# Patient Record
Sex: Male | Born: 1945 | Race: White | Hispanic: No | Marital: Married | Smoking: Never smoker
Health system: Southern US, Community
[De-identification: ages and names within clinical notes are randomized; demographics above are authoritative.]

## PROBLEM LIST (undated history)

## (undated) DIAGNOSIS — R0602 Shortness of breath: Secondary | ICD-10-CM

## (undated) DIAGNOSIS — N201 Calculus of ureter: Secondary | ICD-10-CM

## (undated) DIAGNOSIS — C189 Malignant neoplasm of colon, unspecified: Secondary | ICD-10-CM

## (undated) DIAGNOSIS — T79A29A Traumatic compartment syndrome of unspecified lower extremity, initial encounter: Secondary | ICD-10-CM

## (undated) DIAGNOSIS — I82403 Acute embolism and thrombosis of unspecified deep veins of lower extremity, bilateral: Secondary | ICD-10-CM

## (undated) DIAGNOSIS — M1990 Unspecified osteoarthritis, unspecified site: Secondary | ICD-10-CM

## (undated) DIAGNOSIS — I2699 Other pulmonary embolism without acute cor pulmonale: Secondary | ICD-10-CM

## (undated) DIAGNOSIS — N189 Chronic kidney disease, unspecified: Secondary | ICD-10-CM

## (undated) DIAGNOSIS — I10 Essential (primary) hypertension: Secondary | ICD-10-CM

## (undated) DIAGNOSIS — J45909 Unspecified asthma, uncomplicated: Secondary | ICD-10-CM

## (undated) HISTORY — PX: TONSILLECTOMY: SUR1361

---

## 1994-08-14 HISTORY — PX: RIGHT COLECTOMY: SHX853

## 2013-04-10 HISTORY — PX: URETERAL STENT PLACEMENT: SHX822

## 2013-04-10 HISTORY — PX: TRANSURETHRAL RESECTION OF PROSTATE: SHX73

## 2013-04-14 DIAGNOSIS — I2699 Other pulmonary embolism without acute cor pulmonale: Secondary | ICD-10-CM

## 2013-04-14 HISTORY — DX: Other pulmonary embolism without acute cor pulmonale: I26.99

## 2013-04-18 ENCOUNTER — Inpatient Hospital Stay (HOSPITAL_COMMUNITY): Payer: PRIVATE HEALTH INSURANCE

## 2013-04-18 ENCOUNTER — Encounter (HOSPITAL_COMMUNITY): Payer: Self-pay | Admitting: Family Medicine

## 2013-04-18 ENCOUNTER — Inpatient Hospital Stay (HOSPITAL_COMMUNITY)
Admission: AD | Admit: 2013-04-18 | Discharge: 2013-04-24 | DRG: 662 | Disposition: A | Discharge status: Home Health | Payer: PRIVATE HEALTH INSURANCE | Source: Other Acute Inpatient Hospital | Attending: Internal Medicine | Admitting: Internal Medicine

## 2013-04-18 DIAGNOSIS — R062 Wheezing: Secondary | ICD-10-CM | POA: Diagnosis present

## 2013-04-18 DIAGNOSIS — D62 Acute posthemorrhagic anemia: Secondary | ICD-10-CM | POA: Diagnosis present

## 2013-04-18 DIAGNOSIS — Z9049 Acquired absence of other specified parts of digestive tract: Secondary | ICD-10-CM

## 2013-04-18 DIAGNOSIS — E8779 Other fluid overload: Secondary | ICD-10-CM | POA: Diagnosis not present

## 2013-04-18 DIAGNOSIS — E873 Alkalosis: Secondary | ICD-10-CM | POA: Diagnosis not present

## 2013-04-18 DIAGNOSIS — J9601 Acute respiratory failure with hypoxia: Secondary | ICD-10-CM

## 2013-04-18 DIAGNOSIS — R52 Pain, unspecified: Secondary | ICD-10-CM | POA: Diagnosis present

## 2013-04-18 DIAGNOSIS — F411 Generalized anxiety disorder: Secondary | ICD-10-CM | POA: Diagnosis present

## 2013-04-18 DIAGNOSIS — I129 Hypertensive chronic kidney disease with stage 1 through stage 4 chronic kidney disease, or unspecified chronic kidney disease: Secondary | ICD-10-CM | POA: Diagnosis present

## 2013-04-18 DIAGNOSIS — R7309 Other abnormal glucose: Secondary | ICD-10-CM | POA: Diagnosis not present

## 2013-04-18 DIAGNOSIS — E669 Obesity, unspecified: Secondary | ICD-10-CM | POA: Diagnosis present

## 2013-04-18 DIAGNOSIS — Z79899 Other long term (current) drug therapy: Secondary | ICD-10-CM

## 2013-04-18 DIAGNOSIS — T45515A Adverse effect of anticoagulants, initial encounter: Secondary | ICD-10-CM | POA: Diagnosis present

## 2013-04-18 DIAGNOSIS — N2889 Other specified disorders of kidney and ureter: Secondary | ICD-10-CM | POA: Diagnosis present

## 2013-04-18 DIAGNOSIS — N179 Acute kidney failure, unspecified: Principal | ICD-10-CM | POA: Diagnosis present

## 2013-04-18 DIAGNOSIS — E872 Acidosis, unspecified: Secondary | ICD-10-CM | POA: Diagnosis not present

## 2013-04-18 DIAGNOSIS — I2699 Other pulmonary embolism without acute cor pulmonale: Secondary | ICD-10-CM | POA: Diagnosis present

## 2013-04-18 DIAGNOSIS — J9600 Acute respiratory failure, unspecified whether with hypoxia or hypercapnia: Secondary | ICD-10-CM | POA: Diagnosis present

## 2013-04-18 DIAGNOSIS — F17200 Nicotine dependence, unspecified, uncomplicated: Secondary | ICD-10-CM | POA: Diagnosis present

## 2013-04-18 DIAGNOSIS — I959 Hypotension, unspecified: Secondary | ICD-10-CM | POA: Diagnosis present

## 2013-04-18 DIAGNOSIS — M109 Gout, unspecified: Secondary | ICD-10-CM | POA: Diagnosis present

## 2013-04-18 DIAGNOSIS — N135 Crossing vessel and stricture of ureter without hydronephrosis: Secondary | ICD-10-CM | POA: Diagnosis present

## 2013-04-18 DIAGNOSIS — Z6841 Body Mass Index (BMI) 40.0 and over, adult: Secondary | ICD-10-CM

## 2013-04-18 DIAGNOSIS — N189 Chronic kidney disease, unspecified: Secondary | ICD-10-CM | POA: Diagnosis present

## 2013-04-18 DIAGNOSIS — Z23 Encounter for immunization: Secondary | ICD-10-CM

## 2013-04-18 DIAGNOSIS — S37019A Minor contusion of unspecified kidney, initial encounter: Secondary | ICD-10-CM

## 2013-04-18 DIAGNOSIS — Z85038 Personal history of other malignant neoplasm of large intestine: Secondary | ICD-10-CM

## 2013-04-18 DIAGNOSIS — S37019D Minor contusion of unspecified kidney, subsequent encounter: Secondary | ICD-10-CM

## 2013-04-18 DIAGNOSIS — N1330 Unspecified hydronephrosis: Secondary | ICD-10-CM | POA: Diagnosis present

## 2013-04-18 DIAGNOSIS — N289 Disorder of kidney and ureter, unspecified: Secondary | ICD-10-CM | POA: Diagnosis present

## 2013-04-18 DIAGNOSIS — R319 Hematuria, unspecified: Secondary | ICD-10-CM | POA: Diagnosis present

## 2013-04-18 DIAGNOSIS — S37012A Minor contusion of left kidney, initial encounter: Secondary | ICD-10-CM

## 2013-04-18 HISTORY — DX: Unspecified asthma, uncomplicated: J45.909

## 2013-04-18 HISTORY — DX: Essential (primary) hypertension: I10

## 2013-04-18 HISTORY — DX: Chronic kidney disease, unspecified: N18.9

## 2013-04-18 HISTORY — DX: Other pulmonary embolism without acute cor pulmonale: I26.99

## 2013-04-18 HISTORY — DX: Shortness of breath: R06.02

## 2013-04-18 HISTORY — DX: Malignant neoplasm of colon, unspecified: C18.9

## 2013-04-18 HISTORY — DX: Calculus of ureter: N20.1

## 2013-04-18 HISTORY — DX: Unspecified osteoarthritis, unspecified site: M19.90

## 2013-04-18 LAB — CBC WITH DIFFERENTIAL/PLATELET
Basophils Absolute: 0.1 10*3/uL (ref 0.0–0.1)
Basophils Relative: 0 % (ref 0–1)
Hemoglobin: 9.2 g/dL — ABNORMAL LOW (ref 13.0–17.0)
MCHC: 33.9 g/dL (ref 30.0–36.0)
Neutro Abs: 15.3 10*3/uL — ABNORMAL HIGH (ref 1.7–7.7)
Neutrophils Relative %: 83 % — ABNORMAL HIGH (ref 43–77)
Platelets: 188 10*3/uL (ref 150–400)
RDW: 14.4 % (ref 11.5–15.5)

## 2013-04-18 LAB — COMPREHENSIVE METABOLIC PANEL
ALT: 16 U/L (ref 0–53)
Calcium: 7.8 mg/dL — ABNORMAL LOW (ref 8.4–10.5)
GFR calc Af Amer: 10 mL/min — ABNORMAL LOW (ref >90)
Glucose, Bld: 127 mg/dL — ABNORMAL HIGH (ref 70–99)
Sodium: 134 mEq/L — ABNORMAL LOW (ref 135–145)
Total Protein: 5.7 g/dL — ABNORMAL LOW (ref 6.0–8.3)

## 2013-04-18 LAB — TROPONIN I: Troponin I: <0.3 ng/mL (ref <0.30)

## 2013-04-18 LAB — APTT: aPTT: 39 seconds — ABNORMAL HIGH (ref 24–37)

## 2013-04-18 LAB — PRO B NATRIURETIC PEPTIDE: Pro B Natriuretic peptide (BNP): 1762 pg/mL — ABNORMAL HIGH (ref 0–125)

## 2013-04-18 LAB — PHOSPHORUS: Phosphorus: 7.7 mg/dL — ABNORMAL HIGH (ref 2.3–4.6)

## 2013-04-18 LAB — PROTIME-INR
INR: 1.47 (ref 0.00–1.49)
Prothrombin Time: 17.4 seconds — ABNORMAL HIGH (ref 11.6–15.2)

## 2013-04-18 LAB — MAGNESIUM: Magnesium: 1.9 mg/dL (ref 1.5–2.5)

## 2013-04-18 MED ORDER — SODIUM CHLORIDE 0.9 % IJ SOLN
3.0000 mL | Freq: Two times a day (BID) | INTRAMUSCULAR | Status: DC
  Administered 2013-04-18 – 2013-04-24 (×12): 3 mL via INTRAVENOUS

## 2013-04-18 MED ORDER — FENTANYL CITRATE 0.05 MG/ML IJ SOLN
INTRAMUSCULAR | Status: AC | PRN
  Administered 2013-04-18: 50 ug via INTRAVENOUS
  Administered 2013-04-18 (×2): 25 ug via INTRAVENOUS

## 2013-04-18 MED ORDER — FENTANYL CITRATE 0.05 MG/ML IJ SOLN
25.0000 ug | INTRAMUSCULAR | Status: DC | PRN
  Administered 2013-04-18 – 2013-04-19 (×5): 50 ug via INTRAVENOUS
  Filled 2013-04-18 (×4): qty 2

## 2013-04-18 MED ORDER — MIDAZOLAM HCL 2 MG/2ML IJ SOLN
INTRAMUSCULAR | Status: AC | PRN
  Administered 2013-04-18 (×2): 1 mg via INTRAVENOUS
  Administered 2013-04-18: 2 mg via INTRAVENOUS

## 2013-04-18 MED ORDER — SODIUM CHLORIDE 0.9 % IV SOLN: 250.0000 mL | INTRAVENOUS | Status: DC | PRN

## 2013-04-18 MED ORDER — HYDROMORPHONE HCL PF 1 MG/ML IJ SOLN
0.5000 mg | INTRAMUSCULAR | Status: DC | PRN
  Administered 2013-04-18 – 2013-04-19 (×2): 0.5 mg via INTRAVENOUS
  Filled 2013-04-18 (×3): qty 1

## 2013-04-18 MED ORDER — FENTANYL CITRATE 0.05 MG/ML IJ SOLN
INTRAMUSCULAR | Status: AC
  Filled 2013-04-18: qty 2

## 2013-04-18 MED ORDER — ALBUTEROL SULFATE (5 MG/ML) 0.5% IN NEBU: 2.5000 mg | INHALATION_SOLUTION | RESPIRATORY_TRACT | Status: DC | PRN

## 2013-04-18 MED ORDER — HYDROMORPHONE HCL PF 1 MG/ML IJ SOLN
1.0000 mg | Freq: Once | INTRAMUSCULAR | Status: AC
  Administered 2013-04-18: 1 mg via INTRAVENOUS
  Filled 2013-04-18: qty 1

## 2013-04-18 MED ORDER — IPRATROPIUM BROMIDE 0.02 % IN SOLN
0.5000 mg | Freq: Four times a day (QID) | RESPIRATORY_TRACT | Status: DC
  Administered 2013-04-18 – 2013-04-21 (×11): 0.5 mg via RESPIRATORY_TRACT
  Filled 2013-04-18 (×12): qty 2.5

## 2013-04-18 MED ORDER — ALBUTEROL SULFATE (5 MG/ML) 0.5% IN NEBU
2.5000 mg | INHALATION_SOLUTION | Freq: Four times a day (QID) | RESPIRATORY_TRACT | Status: DC
  Administered 2013-04-18 – 2013-04-19 (×4): 2.5 mg via RESPIRATORY_TRACT
  Filled 2013-04-18 (×5): qty 0.5

## 2013-04-18 MED ORDER — SODIUM CHLORIDE 0.9 % IJ SOLN: 3.0000 mL | INTRAMUSCULAR | Status: DC | PRN

## 2013-04-18 NOTE — Consult Note (Signed)
Urology Consult   Physician requesting consult: Simonds  Reason for consult: Hematuria s/p TURP/lithotripsy/ureteral stent  History of Present Illness: Jack [name] is a 67 y.o. cauc male with PMH significant for HTN, nephrolithiasis, BPH, and colon cancer who was transferred to [facility] today from [place] for treatment of worsening renal insufficiency and PE.  He is s/p cysto, lithotripsy, left ureteral stent placement, and TURP on 04/10/13 in [place]. Pt states he has had hematuria since procedure.  2 days after d/c home from procedure he developed dyspnea then dizziness and diaphoresis which prompted eval in ED at [facility].  His CR was elevated at 2.1 and he was found to have a PE. Anticoagulation was initiated and he was admitted.  He was also found to be anemic and received a transfusion. Since then his Cr and hematuria have worsened.  He had been receiving CBIs via a 84f 3-way foley cath with pink tinged fluid return until his arrival at [facility] at which time the irrigation was stopped.  CT scan performed 04/16/13 revealed a 7.7cm left sided subcapsular fluid collection of the left kidney/possible hematoma and an atrophic right kidney.  He is currently waiting for a PT/INR value so he can be taken to IR for an IVC filter.   He is complaining of back pain which he states started when the plate was placed under his back for a CXR.  He denies abdominal pain, F/C, CP, N/V.  He does have some SOB.    His f[facility].  Per RN old bag had 700cc of pink fluid with no clots.  New bag has no output. IV is KVO currently and his latest Cr is 6.28.  Nephrology has also been consulted.    Past Medical History  Diagnosis Date  . HTN (hypertension)   . Ureteral stone   . Colon cancer     Past Surgical History  Procedure Laterality Date  . Ureteral stent placement  04/10/2013  . Transurethral resection of prostate  04/10/2013  . Right colectomy  1996  Lithotripsy  Current  Hospital Medications:  Home Meds:    Medication List    ASK your doctor about these medications       allopurinol 300 MG tablet  Commonly known as:  ZYLOPRIM  Take 300 mg by mouth daily.     ANDROGEL PUMP 20.25 MG/ACT (1.62%) Gel  Generic drug:  Testosterone  Place 1 Act onto the skin daily.     ciprofloxacin 500 MG tablet  Commonly known as:  CIPRO  Take 500 mg by mouth 2 (two) times daily. For 7 days. Started 04/11/13     docusate sodium 100 MG capsule  Commonly known as:  COLACE  Take 100 mg by mouth 3 (three) times daily.     ketorolac 10 MG tablet  Commonly known as:  TORADOL  Take 10 mg by mouth every 6 (six) hours as needed for pain.     lisinopril 20 MG tablet  Commonly known as:  PRINIVIL,ZESTRIL  Take 20 mg by mouth daily.     OVER THE COUNTER MEDICATION  Take 1 tablet by mouth 3 (three) times daily. Medication for urinary frequency     oxyCODONE 5 MG immediate release tablet  Commonly known as:  Oxy IR/ROXICODONE  Take 5 mg by mouth every 4 (four) hours as needed for pain.     tamsulosin 0.4 MG Caps capsule  Commonly known as:  FLOMAX  Take 0.4 mg by  mouth daily.        Scheduled Meds: . albuterol  2.5 mg Nebulization Q6H  . ipratropium  0.5 mg Nebulization Q6H  . sodium chloride  3 mL Intravenous Q12H   Continuous Infusions:  PRN Meds:.sodium chloride, sodium chloride, albuterol, fentaNYL, sodium chloride  Allergies:  Allergies  Allergen Reactions  . Contrast Media [Iodinated Diagnostic Agents]     Blue dye    Family History  Problem Relation Age of Onset  . Urolithiasis      Social History:  reports that he has never smoked. His smokeless tobacco use includes Chew. His alcohol and drug histories are not on file.  ROS: A complete review of systems was performed.  All systems are negative except for pertinent findings as noted.  Physical Exam:  Vital signs in last 24 hours: Temp:  [97.7 F (36.5 C)] 97.7 F (36.5 C) (09/05  1205) Pulse Rate:  [95-105] 99 (09/05 1400) Resp:  [18-20] 18 (09/05 1300) BP: (110-122)/(55-62) 112/56 mmHg (09/05 1400) SpO2:  [94 %-100 %] 96 % (09/05 1401) Constitutional:  Alert and oriented, mild distress from back pain Cardiovascular: Regular rate and rhythm Respiratory: Normal respiratory effort, Lungs clear bilaterally GI: Abdomen is soft, nontender, nondistended, no abdominal masses GU: 22 3 way foley in place with bloody discharge noted at meatus; no urine in foley line or bag Lymphatic: No lymphadenopathy Neurologic: Grossly intact, no focal deficits Psychiatric: Normal mood and affect  Laboratory Data:   Recent Labs  04/18/13 1245  WBC 18.5*  HGB 9.2*  HCT 27.1*  PLT 188     Recent Labs  04/18/13 1245  NA 134*  K 4.6  CL 97  GLUCOSE 127*  BUN 71*  CALCIUM 7.8*  CREATININE 6.28*     Results for orders placed during the hospital encounter of 04/18/13 (from the past 24 hour(s))  MRSA PCR SCREENING     Status: None   Collection Time    04/18/13 11:07 AM      Result Value Range   MRSA by PCR NEGATIVE  NEGATIVE  GLUCOSE, CAPILLARY     Status: Abnormal   Collection Time    04/18/13 11:19 AM      Result Value Range   Glucose-Capillary 139 (*) 70 - 99 mg/dL  COMPREHENSIVE METABOLIC PANEL     Status: Abnormal   Collection Time    04/18/13 12:45 PM      Result Value Range   Sodium 134 (*) 135 - 145 mEq/L   Potassium 4.6  3.5 - 5.1 mEq/L   Chloride 97  96 - 112 mEq/L   CO2 21  19 - 32 mEq/L   Glucose, Bld 127 (*) 70 - 99 mg/dL   BUN 71 (*) 6 - 23 mg/dL   Creatinine, Ser 3.08 (*) 0.50 - 1.35 mg/dL   Calcium 7.8 (*) 8.4 - 10.5 mg/dL   Total Protein 5.7 (*) 6.0 - 8.3 g/dL   Albumin 2.6 (*) 3.5 - 5.2 g/dL   AST 15  0 - 37 U/L   ALT 16  0 - 53 U/L   Alkaline Phosphatase 65  39 - 117 U/L   Total Bilirubin 0.2 (*) 0.3 - 1.2 mg/dL   GFR calc non Af Amer 8 (*) >90 mL/min   GFR calc Af Amer 10 (*) >90 mL/min  MAGNESIUM     Status: None   Collection Time     04/18/13 12:45 PM      Result Value Range  Magnesium 1.9  1.5 - 2.5 mg/dL  PHOSPHORUS     Status: Abnormal   Collection Time    04/18/13 12:45 PM      Result Value Range   Phosphorus 7.7 (*) 2.3 - 4.6 mg/dL  TROPONIN I     Status: None   Collection Time    04/18/13 12:45 PM      Result Value Range   Troponin I <0.30  <0.30 ng/mL  PRO B NATRIURETIC PEPTIDE     Status: Abnormal   Collection Time    04/18/13 12:45 PM      Result Value Range   Pro B Natriuretic peptide (BNP) 1762.0 (*) 0 - 125 pg/mL  CBC WITH DIFFERENTIAL     Status: Abnormal   Collection Time    04/18/13 12:45 PM      Result Value Range   WBC 18.5 (*) 4.0 - 10.5 K/uL   RBC 3.30 (*) 4.22 - 5.81 MIL/uL   Hemoglobin 9.2 (*) 13.0 - 17.0 g/dL   HCT 16.1 (*) 09.6 - 04.5 %   MCV 82.1  78.0 - 100.0 fL   MCH 27.9  26.0 - 34.0 pg   MCHC 33.9  30.0 - 36.0 g/dL   RDW 40.9  81.1 - 91.4 %   Platelets 188  150 - 400 K/uL   Neutrophils Relative % 83 (*) 43 - 77 %   Neutro Abs 15.3 (*) 1.7 - 7.7 K/uL   Lymphocytes Relative 8 (*) 12 - 46 %   Lymphs Abs 1.5  0.7 - 4.0 K/uL   Monocytes Relative 9  3 - 12 %   Monocytes Absolute 1.6 (*) 0.1 - 1.0 K/uL   Eosinophils Relative 0  0 - 5 %   Eosinophils Absolute 0.0  0.0 - 0.7 K/uL   Basophils Relative 0  0 - 1 %   Basophils Absolute 0.1  0.0 - 0.1 K/uL   Recent Results (from the past 240 hour(s))  MRSA PCR SCREENING     Status: None   Collection Time    04/18/13 11:07 AM      Result Value Range Status   MRSA by PCR NEGATIVE  NEGATIVE Final   Comment:            The GeneXpert MRSA Assay (FDA     approved for NASAL specimens     only), is one component of a     comprehensive MRSA colonization     surveillance program. It is not     intended to diagnose MRSA     infection nor to guide or     monitor treatment for     MRSA infections.    Renal Function:  Recent Labs  04/18/13 1245  CREATININE 6.28*   CrCl is unknown because there is no height on file for the  current visit.  Radiologic Imaging: Dg Chest Port 1 View  04/18/2013   *RADIOLOGY REPORT*  Clinical Data: Evaluate for edema  PORTABLE CHEST - 1 VIEW  Comparison: 04/16/2013  Findings: Low lung volumes are present taking this into consideration heart size is mildly enlarged and stable.  The mediastinal contour appears unchanged with the right hilum appearing somewhat less prominent than on the prior exam. Mild central peribronchial cuffing and interstitial haziness is seen and this may indicate early or incipient pulmonary edema however the interstitium may be accentuated by low lung volumes as well.  No interstitial septal lines, pleural effusion or overt pulmonary edema is seen. No  focal infiltrates are noted.  Old healed rib fracture of the right posterior eighth rib is stable.  IMPRESSION: Subtle changes may indicate early or developing interstitial edema but no overt interstitial or alveolar edema is seen.  No focal infiltrates.   Original Report Authenticated By: Rhodia Albright, M.D.   Procedure:  Foley was hand irrigated with sterile water.  Pink tinged fluid return with several small clots.  Pt tolerated well.   Impression/Recommendation  Hematuria s/p cysto/lithotripsy/left ureteral stent placement/TURP in pt who had been on anticoagulation for PE--continue CBIs.  Instructed RN to document CBI fluid and foley output so approx I/Os can be determined.    Renal hematoma--monitor H/H.  No intervention necessary at this time.   Silas Flood 04/18/2013, 2:52 PM

## 2013-04-18 NOTE — Consult Note (Signed)
I have reviewed the patient's chart along with the history and exam and looked at his films and labs.   The urine is clearing on CBI which can be held now.

## 2013-04-18 NOTE — Progress Notes (Signed)
eLink Physician-Brief Progress Note Patient Name: Jack [name] DOB: [birth year] MRN: [id number]  Date of Service  04/18/2013   HPI/Events of Note   Severe GU pain despite fentanyl IV q2h  eICU Interventions  Give dilaudid 1mg  IV x 1 now and recheck pain score pre- and 20 min post and call MD   Intervention Category Intermediate Interventions: Pain - evaluation and management  Scottlynn Lindell 04/18/2013, 7:57 PM

## 2013-04-18 NOTE — Progress Notes (Signed)
Pt arrived to unit with Foley catheter from [facility] receiving continuous bladder irrigation.  Initial urinalysis unable to be obtained due to CBI therapy.

## 2013-04-18 NOTE — Procedures (Signed)
Placement of IVC filter with carbon dioxide.  Filter placed below renal veins.  No immediate complication.

## 2013-04-18 NOTE — H&P (Signed)
Jack [name] is an 67 y.o. male.   Chief Complaint: TURP procedure; L ureteral stent and double JJ-  performed at [facility] 04/10/13 Did well and sent home following day Developed chest pain and shortness of breath 9/2 Back to [facility]. VQ high probability of PE and started on IV Heparin Developed hematuria requiring bladder irrigation CT reveals L renal hematoma Also progressive renal insufficiency- worsening Cr Pt scheduled for retrievable Inferior Vena Cava filter placement  Pt with Hx colon ca; ARF; allergy to "blue dye" for xray HPI: HTN; renal stone- multiple; colon ca  Past Medical History  Diagnosis Date  . HTN (hypertension)   . Ureteral stone   . Colon cancer     Past Surgical History  Procedure Laterality Date  . Ureteral stent placement  04/10/2013  . Transurethral resection of prostate  04/10/2013  . Right colectomy  1996    Family History  Problem Relation Age of Onset  . Urolithiasis     Social History:  reports that he has never smoked. His smokeless tobacco use includes Chew. His alcohol and drug histories are not on file.  Allergies:  Allergies  Allergen Reactions  . Contrast Media [Iodinated Diagnostic Agents]     Blue dye    Medications Prior to Admission  Medication Sig Dispense Refill  . allopurinol (ZYLOPRIM) 300 MG tablet Take 300 mg by mouth daily.      . ciprofloxacin (CIPRO) 500 MG tablet Take 500 mg by mouth 2 (two) times daily. For 7 days. Started 04/11/13      . docusate sodium (COLACE) 100 MG capsule Take 100 mg by mouth 3 (three) times daily.      Marland Kitchen ketorolac (TORADOL) 10 MG tablet Take 10 mg by mouth every 6 (six) hours as needed for pain.      Marland Kitchen lisinopril (PRINIVIL,ZESTRIL) 20 MG tablet Take 20 mg by mouth daily.      Marland Kitchen OVER THE COUNTER MEDICATION Take 1 tablet by mouth 3 (three) times daily. Medication for urinary frequency      . oxyCODONE (OXY IR/ROXICODONE) 5 MG immediate release tablet Take 5 mg by mouth every 4 (four) hours  as needed for pain.      . tamsulosin (FLOMAX) 0.4 MG CAPS capsule Take 0.4 mg by mouth daily.      . Testosterone (ANDROGEL PUMP) 20.25 MG/ACT (1.62%) GEL Place 1 Act onto the skin daily.        Results for orders placed during the hospital encounter of 04/18/13 (from the past 48 hour(s))  GLUCOSE, CAPILLARY     Status: Abnormal   Collection Time    04/18/13 11:19 AM      Result Value Range   Glucose-Capillary 139 (*) 70 - 99 mg/dL   No results found.  Review of Systems  Constitutional: Negative for fever.  Respiratory: Positive for shortness of breath.   Cardiovascular: Positive for chest pain and leg swelling.  Gastrointestinal: Positive for abdominal pain. Negative for nausea and vomiting.  Genitourinary: Positive for hematuria.       Low UOP  Neurological: Positive for weakness. Negative for dizziness.    Blood pressure 122/62, pulse 105, temperature 97.7 F (36.5 C), temperature source Oral, resp. rate 20, SpO2 94.00%. Physical Exam  Constitutional: He is oriented to person, place, and time.  obese  Cardiovascular: Normal rate, regular rhythm and normal heart sounds.   Distant heart sounds  Respiratory: Effort normal. No respiratory distress. He has wheezes.  GI: Soft. Bowel  sounds are normal. He exhibits distension. There is tenderness.  Musculoskeletal: Normal range of motion. He exhibits edema.  Moves all 4s B leg edema  Neurological: He is alert and oriented to person, place, and time.  Skin: Skin is warm and dry.  Psychiatric: He has a normal mood and affect. His behavior is normal. Judgment and thought content normal.     Assessment/Plan TURP/ L ureteral stent and JJ placed 04/10/13([facility]) Developed SOB and chest pain 9/2 VQ hi prob PE- started on Heparin Developed hematuria and CT shows L renal hematoma Anticoagulation stopped Transferred to [facility] for IVC filter and poss need for dialysis Worsening renal fxn Pt aware of procedure benefits and risks  and agreeable to proceed Consent signed and in consent signed and in chart  Joliene Salvador A 04/18/2013, 12:40 PM

## 2013-04-18 NOTE — H&P (Addendum)
PULMONARY  / CRITICAL CARE MEDICINE  Name: Jack [name] MRN: [id number] DOB: [birth year]    ADMISSION DATE:  04/18/2013  REFERRING MD :  Joanette Gula PRIMARY SERVICE:  PCCM  CHIEF COMPLAINT:  Acute on chronic renal failure  BRIEF PATIENT DESCRIPTION:  24 M underwent TURP and L ureteral stent 8/29. Admitted to [facility] 9/02 with acute PE and anticoagulation initiated. Hospitalization c/b hematuria, progressive rean linsufficiency and anemia requiring transfusion. Bladder irrigations initiated and CT scan abdomen performed 9/03 revealed L renal fluid subcapsular fluid collection thought to represent hematoma. Transferred to [facility] 9/05 for impending need for HD and placement of IVC filter.   SIGNIFICANT EVENTS / STUDIES:  CT head 9/02:  NAD V/Q scan 9/02: high prob. Multiple unmatched Q defects Echocardiogram9/02: LVEF 5- 55%, RV moderately dialted CT abd 9/03: persistent L pelvocaliectasis/ureterectasis. 7.7 cm L sided subcapsular fluid collection, possible hematoma. Atrophic R kidney. S/P R hemicolectomy  ------------------- Above studies performed at [facility] ------------------------ 9/05 Renal US:   LINES / TUBES:   CULTURES:   ANTIBIOTICS:   HISTORY OF PRESENT ILLNESS:   As above. Presently complains of moderate pain in abdomen and chest rated as 5 out of 10. Denies rest dyspnea  PAST MEDICAL HISTORY :  Recurrent renal stones, s/p lithotripsy, S/P L ureteral stent Gout Hypertension Partial colectomy  Prior to Admission medications   Medication Sig Start Date End Date Taking? Authorizing Provider  OVER THE COUNTER MEDICATION Take 1 tablet by mouth 3 (three) times daily. Medication for urinary frequency   Yes Historical Provider, MD   Allergies  Allergen Reactions  . Contrast Media [Iodinated Diagnostic Agents]     Blue dye    FAMILY HISTORY:  Htn CAD Cancer, NOS  SOCIAL HISTORY: No EtOH No smoking Chews tobacco No occupational exposures Fully  independent when in [employer]  REVIEW OF SYSTEMS:   Review of Systems  Constitutional: Negative.   HENT: Negative.   Eyes: Negative.   Respiratory: Positive for shortness of breath.   Cardiovascular: Positive for chest pain.  Gastrointestinal: Negative.   Genitourinary: Positive for hematuria.  Musculoskeletal: Negative.   Skin: Negative.   Neurological: Positive for tremors.       Chronic  Endo/Heme/Allergies: Negative.   Psychiatric/Behavioral: Negative.     SUBJECTIVE:   VITAL SIGNS:   Reviewed  HEMODYNAMICS:   VENTILATOR SETTINGS:   INTAKE / OUTPUT: Intake/Output   None     PHYSICAL EXAMINATION: General:  Pleasant, WDWN, NAD Neuro:  Coarse tremor, no asterixis, normal motor/sens, DTRs symmetric, cognition intact HEENT:  WNL Cardiovascular: RRR s M Lungs: scattered wheezes, dependent rales Abdomen: obese, soft, NABS Ext: trace symmetric edema   LABS:  Labs from [place] reviewed  CBC No results found for this basename: WBC, HGB, HCT, PLT,  in the last 72 hours Coag's No results found for this basename: APTT, INR,  in the last 72 hours BMET No results found for this basename: NA, K, CL, CO2, BUN, CREATININE, GLUCOSE,  in the last 72 hours Electrolytes No results found for this basename: CALCIUM, MG, PHOS,  in the last 72 hours Sepsis Markers No results found for this basename: LACTICACIDVEN, PROCALCITON, O2SATVEN,  in the last 72 hours ABG No results found for this basename: PHART, PCO2ART, PO2ART,  in the last 72 hours Liver Enzymes No results found for this basename: AST, ALT, ALKPHOS, BILITOT, ALBUMIN,  in the last 72 hours Cardiac Enzymes No results found for this basename: TROPONINI, PROBNP,  in the last  72 hours Glucose No results found for this basename: GLUCAP,  in the last 72 hours  Imaging Images from [place] reviewed  CXR: pending  ASSESSMENT / PLAN:  PULMONARY A: Acute hypoxic respiratory failure Acute PE 9/02 Wheezing - suspect  due to pulm edema P:   CXR ordered BDs ordered Supplemental O2 Will need IVC filter in setting of significant bleeding - IR called  CARDIOVASCULAR A: No issues P:  Monitor   RENAL A: Acute renal insufficiency Suspect component of CKD Recurrent kidney stones Recent TURP and ureteral stent (8/29) Renal capsular hematoma Hematuria - bladder irrigation in place on transfer P:   Renal consult Urology consult - please address whether to cont irrigations  GASTROINTESTINAL A:  No issues P:   NPO for IVC filter placement Then CHO mod diet  HEMATOLOGIC A:  Acute blood loss anemia P:  Recheck CBC Transfuse for Hgb < 7.0 or active overt bleeding with HD compromise  INFECTIOUS A:  No issues P:   Monitor off abx  ENDOCRINE A:  No hx of DM P:   CBGs q 8 hrs -  SSI if glu > 180  NEUROLOGIC A:  Tremor, doubt asterixis Pain P:   Monitor for sign of uremia PRN fentanyl  TODAY'S SUMMARY:   I have personally obtained a history, examined the patient, evaluated laboratory and imaging results, formulated the assessment and plan and placed orders. CRITICAL CARE: The patient is critically ill with multiple organ systems failure and requires high complexity decision making for assessment and support, frequent evaluation and titration of therapies, application of advanced monitoring technologies and extensive interpretation of multiple databases. Critical Care Time devoted to patient care services described in this note is 35 minutes.   [name], MD ; [facility] [phone].  After 5:30 PM or weekends, call [phone]  Pulmonary and Critical Care Medicine [facility] Pager: [phone]  04/18/2013, 11:41 AM

## 2013-04-18 NOTE — Consult Note (Signed)
Reason for Consult: Acute Kidney injury  Referring Physician: Dr. [name] HPI:  Jack [name] is an 67 y.o. male presenting here from [place] [facility]. He had a lithotripsy, TURP, and uretal stent placed on 8/28. He was discharged home and did well for 2 days. He began to develop dyspnea 2 days after being home. 4 days after discharge he had an episode of dizziness and diaphoresis when he was helped to a chair by his wife. They went to the Ed where his creatinine was found to be 2.1, a troponin was elevated, and a V/Q scan revealed a PE. He was started on anticoagulation, ASA, and BB and admitted. They discontinued his lisinopril on admission. He has been admitted for 3 days and his creatinine has been slowly worsening since that time. He developed hematuria and blood around the meatus and his bladder was irrigated.   At home he also endorses several months of worsening vision, intermittent LE edema, L flank pain, suprapubic pressure, and knee pain he attributes to gout. He denies headache, chest pain, diarrhea, and constipation. He reports that he has had approx 18 stones in the last 10 years.       Baseline Cr??Diagnosed with large L renal hematoma.. Has stent on that side.  Undergoing CBI for hematuria. R kidney very small on CT scan.     Past Medical History  Diagnosis Date  . HTN (hypertension)   . Ureteral stone   . Colon cancer     Past Surgical History  Procedure Laterality Date  . Ureteral stent placement  04/10/2013  . Transurethral resection of prostate  04/10/2013  . Right colectomy  1996    Family History  Problem Relation Age of Onset  . Urolithiasis      Social History:  reports that he has never smoked. His smokeless tobacco use includes Chew. His alcohol and drug histories are not on file.  Allergies:  Allergies  Allergen Reactions  . Contrast Media [Iodinated Diagnostic Agents]     Blue dye    Medications: I have reviewed the patient's current  medications.   Results for orders placed during the hospital encounter of 04/18/13 (from the past 48 hour(s))  GLUCOSE, CAPILLARY     Status: Abnormal   Collection Time    04/18/13 11:19 AM      Result Value Range   Glucose-Capillary 139 (*) 70 - 99 mg/dL    No results found.  ROS: Per HPI  Vitals: BP 114/61, RR 24, Pulse 112   Gen: NAD, alert, cooperative with exam, sits up with significant effort HEENT: NCAT, EOMI, PERRL, Fundi benign , Pharynx without teeth. CV: RRR, good S1/S2, no murmur Resp: crackles at the bases BL, mildly labored, satting well on 2.5 L via [place]. Marked decreased bs. Abd: Soft, obese, tenderness to palpation diffusely but greatest in the LUQ, exam limited by body habitus Liver down 6 cm Ext: trace pitting edema, ted hose in place, 2+ DP pulses Neuro: Alert and oriented X 3, Strength 5/5 and sensation intact in all four extremities, CN2-12 intact Skin: several hyperpigmented macules on BL shins   Assessment/Plan: 1 Acute Kidney injury s/p obstruction, ureteral stent placement, and TURP - Multifactorial: hypotensive/volume depleted with ace inhibitor use causing likely ATN and ? still obstructed on L despite recent stent,   Need to see if potential obstruct on R or any poss function.  If is unilat kidney model and bleed, has ATN or hematoma with parnchymal compression +/- obstruction.   -  CT with L renal fluid collection, likely hematoma, and atrophic kidney on the R - Urology consulted- appreciate recommendations - DC bladder irrigation, collect UA and urine sodium/creatinine after 12 hours - Hx of gout, check uric acid to r/o urate nephropathy - Avoid renal insults: Avoid ace, limit dye, limit fluids - follow daily chemistries - strict I/o so we can get a good idea of UOP - Restrict fluids  2  PE  - IVC filter being placed, current blood loss anemia preventing anticoagulation - will monitor closely for volume overload which would complicate his  respiratory status  3 Hypertension: Avoid ACEi  4. Anemia - Likely blood loss from hematuria- monitor and replace as needed  5. Elevated troponin-  - Called NSTEMI originally by Cards in [place], likely elevated due to PE and decreased filtration by kidney - Continue anticoagulation, aspirin  Kevin Fenton 04/18/2013, 12:01 PM  I have seen and examined this patient and agree with the plan of care seen, eval, counseled patient and family.  Discussed with family, and primary svc.  No immed indic for RRT .  Neveen Daponte L 04/18/2013, 1:41 PM

## 2013-04-19 ENCOUNTER — Encounter (HOSPITAL_COMMUNITY): Payer: Self-pay | Admitting: Anesthesiology

## 2013-04-19 ENCOUNTER — Encounter (HOSPITAL_COMMUNITY)
Admission: AD | Disposition: A | Discharge status: Home Health | Payer: Self-pay | Source: Other Acute Inpatient Hospital | Attending: Pulmonary Disease

## 2013-04-19 ENCOUNTER — Inpatient Hospital Stay (HOSPITAL_COMMUNITY): Payer: PRIVATE HEALTH INSURANCE

## 2013-04-19 ENCOUNTER — Inpatient Hospital Stay (HOSPITAL_COMMUNITY): Payer: PRIVATE HEALTH INSURANCE | Admitting: Anesthesiology

## 2013-04-19 DIAGNOSIS — N179 Acute kidney failure, unspecified: Secondary | ICD-10-CM

## 2013-04-19 HISTORY — PX: CYSTOSCOPY WITH STENT PLACEMENT: SHX5790

## 2013-04-19 HISTORY — PX: CYSTOSCOPY: SHX5120

## 2013-04-19 HISTORY — PX: LAPAROTOMY: SHX154

## 2013-04-19 LAB — BASIC METABOLIC PANEL
BUN: 86 mg/dL — ABNORMAL HIGH (ref 6–23)
BUN: 94 mg/dL — ABNORMAL HIGH (ref 6–23)
BUN: 92 mg/dL — ABNORMAL HIGH (ref 6–23)
BUN: 93 mg/dL — ABNORMAL HIGH (ref 6–23)
CO2: 18 mEq/L — ABNORMAL LOW (ref 19–32)
CO2: 15 mEq/L — ABNORMAL LOW (ref 19–32)
CO2: 17 mEq/L — ABNORMAL LOW (ref 19–32)
CO2: 18 mEq/L — ABNORMAL LOW (ref 19–32)
Calcium: 7.8 mg/dL — ABNORMAL LOW (ref 8.4–10.5)
Chloride: 96 mEq/L (ref 96–112)
Chloride: 99 mEq/L (ref 96–112)
Creatinine, Ser: 8.27 mg/dL — ABNORMAL HIGH (ref 0.50–1.35)
Creatinine, Ser: 8.75 mg/dL — ABNORMAL HIGH (ref 0.50–1.35)
Glucose, Bld: 145 mg/dL — ABNORMAL HIGH (ref 70–99)
Glucose, Bld: 134 mg/dL — ABNORMAL HIGH (ref 70–99)
Glucose, Bld: 137 mg/dL — ABNORMAL HIGH (ref 70–99)
Glucose, Bld: 152 mg/dL — ABNORMAL HIGH (ref 70–99)
Potassium: 4.8 mEq/L (ref 3.5–5.1)
Potassium: 4.8 mEq/L (ref 3.5–5.1)
Sodium: 132 mEq/L — ABNORMAL LOW (ref 135–145)
Sodium: 131 mEq/L — ABNORMAL LOW (ref 135–145)

## 2013-04-19 LAB — LACTIC ACID, PLASMA
Lactic Acid, Venous: 1.4 mmol/L (ref 0.5–2.2)
Lactic Acid, Venous: 1.2 mmol/L (ref 0.5–2.2)
Lactic Acid, Venous: 1.5 mmol/L (ref 0.5–2.2)

## 2013-04-19 LAB — RENAL FUNCTION PANEL
Albumin: 2.3 g/dL — ABNORMAL LOW (ref 3.5–5.2)
Calcium: 7.5 mg/dL — ABNORMAL LOW (ref 8.4–10.5)
Chloride: 97 mEq/L (ref 96–112)
Creatinine, Ser: 9.79 mg/dL — ABNORMAL HIGH (ref 0.50–1.35)
GFR calc non Af Amer: 5 mL/min — ABNORMAL LOW (ref >90)

## 2013-04-19 LAB — COMPREHENSIVE METABOLIC PANEL
Albumin: 2.2 g/dL — ABNORMAL LOW (ref 3.5–5.2)
Alkaline Phosphatase: 59 U/L (ref 39–117)
BUN: 92 mg/dL — ABNORMAL HIGH (ref 6–23)
Potassium: 4.7 mEq/L (ref 3.5–5.1)
Sodium: 133 mEq/L — ABNORMAL LOW (ref 135–145)
Total Protein: 4.9 g/dL — ABNORMAL LOW (ref 6.0–8.3)

## 2013-04-19 LAB — BLOOD GAS, ARTERIAL
Acid-base deficit: 9.5 mmol/L — ABNORMAL HIGH (ref 0.0–2.0)
Bicarbonate: 17.5 mEq/L — ABNORMAL LOW (ref 20.0–24.0)
Drawn by: 36496
FIO2: 0.5 %
MECHVT: 600 mL
TCO2: 19 mmol/L (ref 0–100)
TCO2: 17.4 mmol/L (ref 0–100)
pCO2 arterial: 49.6 mmHg — ABNORMAL HIGH (ref 35.0–45.0)
pCO2 arterial: 42.4 mmHg (ref 35.0–45.0)
pH, Arterial: 7.173 — CL (ref 7.350–7.450)
pH, Arterial: 7.204 — ABNORMAL LOW (ref 7.350–7.450)

## 2013-04-19 LAB — CBC
HCT: 24.3 % — ABNORMAL LOW (ref 39.0–52.0)
HCT: 23.1 % — ABNORMAL LOW (ref 39.0–52.0)
HCT: 20.8 % — ABNORMAL LOW (ref 39.0–52.0)
Hemoglobin: 8.5 g/dL — ABNORMAL LOW (ref 13.0–17.0)
Hemoglobin: 8.1 g/dL — ABNORMAL LOW (ref 13.0–17.0)
MCH: 28.6 pg (ref 26.0–34.0)
MCHC: 34.1 g/dL (ref 30.0–36.0)
MCV: 81.8 fL (ref 78.0–100.0)
RBC: 2.97 MIL/uL — ABNORMAL LOW (ref 4.22–5.81)
RBC: 2.86 MIL/uL — ABNORMAL LOW (ref 4.22–5.81)
RDW: 14.8 % (ref 11.5–15.5)

## 2013-04-19 LAB — POCT I-STAT, CHEM 8
BUN: 110 mg/dL — ABNORMAL HIGH (ref 6–23)
Chloride: 103 mEq/L (ref 96–112)
Creatinine, Ser: 8.5 mg/dL — ABNORMAL HIGH (ref 0.50–1.35)
Potassium: 4.8 mEq/L (ref 3.5–5.1)
Sodium: 135 mEq/L (ref 135–145)

## 2013-04-19 LAB — POCT I-STAT 3, ART BLOOD GAS (G3+)
Acid-base deficit: 9 mmol/L — ABNORMAL HIGH (ref 0.0–2.0)
Bicarbonate: 16 mEq/L — ABNORMAL LOW (ref 20.0–24.0)

## 2013-04-19 LAB — CORTISOL: Cortisol, Plasma: 36.3 ug/dL

## 2013-04-19 LAB — PROTIME-INR: Prothrombin Time: 16.7 seconds — ABNORMAL HIGH (ref 11.6–15.2)

## 2013-04-19 LAB — MAGNESIUM: Magnesium: 1.9 mg/dL (ref 1.5–2.5)

## 2013-04-19 LAB — PHOSPHORUS: Phosphorus: 9.4 mg/dL — ABNORMAL HIGH (ref 2.3–4.6)

## 2013-04-19 LAB — GLUCOSE, CAPILLARY

## 2013-04-19 SURGERY — CYSTOSCOPY, WITH STENT INSERTION
Anesthesia: Choice | Site: Abdomen

## 2013-04-19 MED ORDER — ETOMIDATE 2 MG/ML IV SOLN
INTRAVENOUS | Status: DC | PRN
  Administered 2013-04-19: 15 mg via INTRAVENOUS
  Administered 2013-04-19: 5 mg via INTRAVENOUS

## 2013-04-19 MED ORDER — VANCOMYCIN HCL 10 G IV SOLR
2000.0000 mg | INTRAVENOUS | Status: AC
  Administered 2013-04-19: 2000 mg via INTRAVENOUS
  Filled 2013-04-19: qty 2000

## 2013-04-19 MED ORDER — PROPOFOL INFUSION 10 MG/ML OPTIME
INTRAVENOUS | Status: DC | PRN
  Administered 2013-04-19: 75 ug/kg/min via INTRAVENOUS

## 2013-04-19 MED ORDER — FENTANYL BOLUS VIA INFUSION
25.0000 ug | Freq: Four times a day (QID) | INTRAVENOUS | Status: DC | PRN
  Filled 2013-04-19: qty 100

## 2013-04-19 MED ORDER — CHLORHEXIDINE GLUCONATE 0.12 % MT SOLN
15.0000 mL | Freq: Two times a day (BID) | OROMUCOSAL | Status: DC
  Administered 2013-04-19 – 2013-04-20 (×2): 15 mL via OROMUCOSAL
  Filled 2013-04-19 (×2): qty 15

## 2013-04-19 MED ORDER — SODIUM BICARBONATE 8.4 % IV SOLN
INTRAVENOUS | Status: AC
  Administered 2013-04-19: 50 meq
  Filled 2013-04-19: qty 100

## 2013-04-19 MED ORDER — HYDROMORPHONE HCL PF 1 MG/ML IJ SOLN
1.0000 mg | INTRAMUSCULAR | Status: DC | PRN
  Administered 2013-04-19: 1 mg via INTRAVENOUS

## 2013-04-19 MED ORDER — SODIUM CHLORIDE 0.9 % IV SOLN
10.0000 mg | INTRAVENOUS | Status: DC | PRN
  Administered 2013-04-19: 15 ug/min via INTRAVENOUS

## 2013-04-19 MED ORDER — HYDROMORPHONE HCL PF 1 MG/ML IJ SOLN: 0.2500 mg | INTRAMUSCULAR | Status: DC | PRN

## 2013-04-19 MED ORDER — VECURONIUM BROMIDE 10 MG IV SOLR: INTRAVENOUS | Status: DC | PRN

## 2013-04-19 MED ORDER — SODIUM CHLORIDE 0.9 % IV SOLN
1.0000 mg/h | INTRAVENOUS | Status: DC
  Administered 2013-04-19: 4 mg/h via INTRAVENOUS
  Administered 2013-04-20: 5 mg/h via INTRAVENOUS
  Filled 2013-04-19 (×3): qty 10

## 2013-04-19 MED ORDER — SODIUM CHLORIDE 0.9 % IV SOLN
25.0000 ug/h | INTRAVENOUS | Status: DC
  Administered 2013-04-19 – 2013-04-20 (×2): 200 ug/h via INTRAVENOUS
  Filled 2013-04-19 (×2): qty 50

## 2013-04-19 MED ORDER — SUFENTANIL CITRATE 50 MCG/ML IV SOLN
INTRAVENOUS | Status: DC | PRN
  Administered 2013-04-19: 20 ug via INTRAVENOUS
  Administered 2013-04-19 (×3): 10 ug via INTRAVENOUS

## 2013-04-19 MED ORDER — LIDOCAINE HCL (CARDIAC) 20 MG/ML IV SOLN
INTRAVENOUS | Status: DC | PRN
  Administered 2013-04-19: 100 mg via INTRAVENOUS

## 2013-04-19 MED ORDER — FAMOTIDINE IN NACL 20-0.9 MG/50ML-% IV SOLN
20.0000 mg | INTRAVENOUS | Status: DC
  Administered 2013-04-19 – 2013-04-20 (×2): 20 mg via INTRAVENOUS
  Filled 2013-04-19 (×3): qty 50

## 2013-04-19 MED ORDER — ROCURONIUM BROMIDE 100 MG/10ML IV SOLN
INTRAVENOUS | Status: DC | PRN
  Administered 2013-04-19: 20 mg via INTRAVENOUS
  Administered 2013-04-19: 50 mg via INTRAVENOUS
  Administered 2013-04-19: 10 mg via INTRAVENOUS
  Administered 2013-04-19: 20 mg via INTRAVENOUS

## 2013-04-19 MED ORDER — SUCCINYLCHOLINE CHLORIDE 20 MG/ML IJ SOLN
INTRAMUSCULAR | Status: DC | PRN
  Administered 2013-04-19: 100 mg via INTRAVENOUS

## 2013-04-19 MED ORDER — DOPAMINE-DEXTROSE 3.2-5 MG/ML-% IV SOLN
2.0000 ug/kg/min | INTRAVENOUS | Status: DC
  Administered 2013-04-19: 10 ug/kg/min via INTRAVENOUS
  Filled 2013-04-19: qty 250

## 2013-04-19 MED ORDER — ALBUMIN HUMAN 5 % IV SOLN
25.0000 g | Freq: Once | INTRAVENOUS | Status: AC
  Administered 2013-04-19: 25 g via INTRAVENOUS
  Filled 2013-04-19 (×2): qty 250

## 2013-04-19 MED ORDER — DOPAMINE-DEXTROSE 3.2-5 MG/ML-% IV SOLN
INTRAVENOUS | Status: AC
  Filled 2013-04-19: qty 250

## 2013-04-19 MED ORDER — MIDAZOLAM HCL 5 MG/5ML IJ SOLN
INTRAMUSCULAR | Status: DC | PRN
  Administered 2013-04-19 (×2): 2 mg via INTRAVENOUS

## 2013-04-19 MED ORDER — BIOTENE DRY MOUTH MT LIQD
15.0000 mL | Freq: Four times a day (QID) | OROMUCOSAL | Status: DC
  Administered 2013-04-20 (×2): 15 mL via OROMUCOSAL

## 2013-04-19 MED ORDER — PHENYLEPHRINE HCL 10 MG/ML IJ SOLN
INTRAMUSCULAR | Status: DC | PRN
  Administered 2013-04-19 (×2): 80 ug via INTRAVENOUS

## 2013-04-19 MED ORDER — SODIUM CHLORIDE 0.9 % IV SOLN
INTRAVENOUS | Status: DC | PRN
  Administered 2013-04-19 (×2): via INTRAVENOUS

## 2013-04-19 MED ORDER — PIPERACILLIN-TAZOBACTAM IN DEX 2-0.25 GM/50ML IV SOLN
2.2500 g | Freq: Three times a day (TID) | INTRAVENOUS | Status: DC
  Administered 2013-04-19 – 2013-04-20 (×2): 2.25 g via INTRAVENOUS
  Filled 2013-04-19 (×4): qty 50

## 2013-04-19 MED ORDER — ALBUTEROL SULFATE (5 MG/ML) 0.5% IN NEBU: 2.5000 mg | INHALATION_SOLUTION | RESPIRATORY_TRACT | Status: DC | PRN

## 2013-04-19 MED ORDER — DEXMEDETOMIDINE HCL IN NACL 200 MCG/50ML IV SOLN: 0.4000 ug/kg/h | INTRAVENOUS | Status: DC

## 2013-04-19 MED ORDER — SODIUM CHLORIDE 0.9 % IV BOLUS (SEPSIS)
500.0000 mL | Freq: Once | INTRAVENOUS | Status: AC
  Administered 2013-04-19: 500 mL via INTRAVENOUS

## 2013-04-19 MED ORDER — DEXTROSE 5 % IV SOLN
2.0000 g | INTRAVENOUS | Status: AC
  Administered 2013-04-19: 2 g via INTRAVENOUS
  Filled 2013-04-19: qty 2

## 2013-04-19 MED ORDER — VECURONIUM BROMIDE 10 MG IV SOLR
INTRAVENOUS | Status: DC | PRN
  Administered 2013-04-19: 10 mg via INTRAVENOUS

## 2013-04-19 MED ORDER — PHYTONADIONE 5 MG PO TABS
5.0000 mg | ORAL_TABLET | Freq: Once | ORAL | Status: AC
  Administered 2013-04-19: 5 mg via ORAL
  Filled 2013-04-19: qty 1

## 2013-04-19 MED ORDER — LEVOFLOXACIN IN D5W 750 MG/150ML IV SOLN
750.0000 mg | Freq: Once | INTRAVENOUS | Status: AC
  Administered 2013-04-19: 750 mg via INTRAVENOUS
  Filled 2013-04-19: qty 150

## 2013-04-19 MED ORDER — FENTANYL CITRATE 0.05 MG/ML IJ SOLN
INTRAMUSCULAR | Status: DC | PRN
  Administered 2013-04-19: 100 ug via INTRAVENOUS
  Administered 2013-04-19: 50 ug via INTRAVENOUS
  Administered 2013-04-19 (×2): 100 ug via INTRAVENOUS

## 2013-04-19 MED ORDER — DIATRIZOATE MEGLUMINE 30 % UR SOLN
URETHRAL | Status: DC | PRN
  Administered 2013-04-19: 50 mL via URETHRAL

## 2013-04-19 MED ORDER — ALBUTEROL SULFATE (5 MG/ML) 0.5% IN NEBU
INHALATION_SOLUTION | RESPIRATORY_TRACT | Status: AC
  Filled 2013-04-19: qty 0.5

## 2013-04-19 MED ORDER — PANTOPRAZOLE SODIUM 40 MG IV SOLR
40.0000 mg | Freq: Every day | INTRAVENOUS | Status: DC
  Administered 2013-04-19 – 2013-04-21 (×3): 40 mg via INTRAVENOUS
  Filled 2013-04-19 (×3): qty 40

## 2013-04-19 MED ORDER — LEVOFLOXACIN IN D5W 500 MG/100ML IV SOLN: 500.0000 mg | INTRAVENOUS | Status: DC

## 2013-04-19 MED ORDER — ALBUTEROL SULFATE HFA 108 (90 BASE) MCG/ACT IN AERS
4.0000 | INHALATION_SPRAY | RESPIRATORY_TRACT | Status: DC | PRN
  Filled 2013-04-19: qty 6.7

## 2013-04-19 MED ORDER — ALBUMIN HUMAN 5 % IV SOLN
INTRAVENOUS | Status: DC | PRN
  Administered 2013-04-19: 17:00:00 via INTRAVENOUS

## 2013-04-19 MED ORDER — MIDAZOLAM BOLUS VIA INFUSION
1.0000 mg | INTRAVENOUS | Status: DC | PRN
  Administered 2013-04-19 – 2013-04-20 (×2): 2 mg via INTRAVENOUS
  Filled 2013-04-19: qty 2

## 2013-04-19 MED ORDER — ACETAMINOPHEN 10 MG/ML IV SOLN
1000.0000 mg | INTRAVENOUS | Status: AC
  Administered 2013-04-20: 1000 mg via INTRAVENOUS
  Filled 2013-04-19: qty 100

## 2013-04-19 MED ORDER — ALBUTEROL SULFATE HFA 108 (90 BASE) MCG/ACT IN AERS
4.0000 | INHALATION_SPRAY | RESPIRATORY_TRACT | Status: DC
  Filled 2013-04-19: qty 6.7

## 2013-04-19 MED ORDER — NALOXONE HCL 0.4 MG/ML IJ SOLN
INTRAMUSCULAR | Status: AC
  Filled 2013-04-19: qty 1

## 2013-04-19 MED ORDER — MIDAZOLAM HCL 2 MG/2ML IJ SOLN
1.0000 mg | INTRAMUSCULAR | Status: DC | PRN
  Administered 2013-04-19 (×2): 2 mg via INTRAVENOUS
  Filled 2013-04-19 (×2): qty 2

## 2013-04-19 MED ORDER — ONDANSETRON HCL 4 MG/2ML IJ SOLN: 4.0000 mg | Freq: Once | INTRAMUSCULAR | Status: DC | PRN

## 2013-04-19 MED ORDER — ALBUTEROL SULFATE (5 MG/ML) 0.5% IN NEBU
2.5000 mg | INHALATION_SOLUTION | Freq: Four times a day (QID) | RESPIRATORY_TRACT | Status: DC
  Administered 2013-04-19 – 2013-04-21 (×7): 2.5 mg via RESPIRATORY_TRACT
  Filled 2013-04-19 (×6): qty 0.5

## 2013-04-19 MED ORDER — SODIUM CHLORIDE 0.9 % IR SOLN
Status: DC | PRN
  Administered 2013-04-19: 6000 mL via INTRAVESICAL

## 2013-04-19 SURGICAL SUPPLY — 33 items
BUCKET BIOHAZARD WASTE 5 GAL (MISCELLANEOUS) ×4 IMPLANT
CATH COUDE 22FR 5CC (CATHETERS) ×3 IMPLANT
CATH FOLEY 2WAY SLVR  5CC 22FR (CATHETERS) ×1
CATH FOLEY 2WAY SLVR 5CC 22FR (CATHETERS) ×3 IMPLANT
CATH FOLEY 3WAY 30CC 20FR (CATHETERS) ×4 IMPLANT
CATH RIBBED COUDE 30CC (CATHETERS) ×1
CATH URET 5FR 28IN OPEN ENDED (CATHETERS) ×4 IMPLANT
CLOTH BEACON ORANGE TIMEOUT ST (SAFETY) ×4 IMPLANT
COVER MAYO STAND STRL (DRAPES) ×4 IMPLANT
COVER SURGICAL LIGHT HANDLE (MISCELLANEOUS) ×4 IMPLANT
DRAPE CAMERA CLOSED 9X96 (DRAPES) ×4 IMPLANT
GLOVE SS BIOGEL STRL SZ 8 (GLOVE) ×6 IMPLANT
GLOVE SUPERSENSE BIOGEL SZ 8 (GLOVE) ×2
GOWN PREVENTION PLUS XLARGE (GOWN DISPOSABLE) ×4 IMPLANT
GOWN SRG XL XLNG 56XLVL 4 (GOWN DISPOSABLE) ×9 IMPLANT
GOWN STRL NON-REIN XL XLG LVL4 (GOWN DISPOSABLE) ×3
GUIDEWIRE STR DUAL SENSOR (WIRE) ×4 IMPLANT
H R LUBE JELLY XXX (MISCELLANEOUS) ×4 IMPLANT
KIT BASIN OR (CUSTOM PROCEDURE TRAY) ×8 IMPLANT
KIT ROOM TURNOVER OR (KITS) ×4 IMPLANT
PACK CYSTOSCOPY (CUSTOM PROCEDURE TRAY) ×4 IMPLANT
PACK GENERAL/GYN (CUSTOM PROCEDURE TRAY) ×4 IMPLANT
PAD ARMBOARD 7.5X6 YLW CONV (MISCELLANEOUS) ×8 IMPLANT
SPONGE GAUZE 4X4 12PLY (GAUZE/BANDAGES/DRESSINGS) ×4 IMPLANT
STENT CONTOUR NO GW 8FR 26CM (STENTS) ×8 IMPLANT
SUT ETHILON 2 0 PSLX (SUTURE) ×4 IMPLANT
SUT VIC AB 2-0 UR5 27 (SUTURE) ×8 IMPLANT
SYRINGE IRR TOOMEY STRL 70CC (SYRINGE) ×4 IMPLANT
TAPE CLOTH SURG 4X10 WHT LF (GAUZE/BANDAGES/DRESSINGS) ×4 IMPLANT
THERMADRAPE LEGGINGS (DRAPES) IMPLANT
TOWEL OR 17X24 6PK STRL BLUE (TOWEL DISPOSABLE) ×4 IMPLANT
TOWEL OR 17X26 10 PK STRL BLUE (TOWEL DISPOSABLE) ×8 IMPLANT
UNDERPAD 30X30 INCONTINENT (UNDERPADS AND DIAPERS) ×4 IMPLANT

## 2013-04-19 NOTE — Progress Notes (Signed)
eLink Physician-Brief Progress Note Patient Name: Jack [name] DOB: [birth year] MRN: [id number]  Date of Service  04/19/2013   HPI/Events of Note  Agitation    eICU Interventions  Change versed prn to drip       Dmani Mizer 04/19/2013, 9:45 PM

## 2013-04-19 NOTE — Progress Notes (Signed)
Call made to Dr. Frederico Hamman via elink regarding patients urine output. Urine output minimal. 10ml throughout whole shift. Bladder scan done which showed 50ml.   Dr. Annabell Howells notified and aware. Patient is alert and oritented does not appear in any distress. Per Dr Annabell Howells, discontinue continuous bladder irrigation.  Output from irrigation is clear. No further orders at this time

## 2013-04-19 NOTE — Progress Notes (Signed)
Subjective: Interval History: has complaints distended and pain.  Objective: Vital signs in last 24 hours: Temp:  [97.1 F (36.2 C)-98 F (36.7 C)] 98 F (36.7 C) (09/06 0739) Pulse Rate:  [89-108] 102 (09/06 0758) Resp:  [15-30] 20 (09/06 0758) BP: (76-130)/(29-96) 99/44 mmHg (09/06 0600) SpO2:  [94 %-100 %] 98 % (09/06 0758) Weight:  [128.1 kg (282 lb 6.6 oz)] 128.1 kg (282 lb 6.6 oz) (09/06 0500) Weight change:   Intake/Output from previous day: 09/05 0701 - 09/06 0700 In: 4270 [I.V.:170] Out: 3985 [Urine:3985] Intake/Output this shift:    General appearance: cooperative, severe distress, moderately obese and pale Resp: diminished breath sounds bilaterally Cardio: S1, S2 normal and systolic murmur: holosystolic 2/6, blowing at apex GI: distended, no bs, tender flanks Extremities: edema 1+  Lab Results:  Recent Labs  04/18/13 1245 04/19/13 0511  WBC 18.5* 16.1*  HGB 9.2* 8.5*  HCT 27.1* 24.3*  PLT 188 189   BMET:  Recent Labs  04/18/13 1245 04/19/13 0511  NA 134* 132*  K 4.6 4.8  CL 97 96  CO2 21 18*  GLUCOSE 127* 145*  BUN 71* 86*  CREATININE 6.28* 8.27*  CALCIUM 7.8* 7.8*   No results found for this basename: PTH,  in the last 72 hours Iron Studies: No results found for this basename: IRON, TIBC, TRANSFERRIN, FERRITIN,  in the last 72 hours  Studies/Results: Ir Ivc Filter Plmt / S&i /img Guid/mod Sed  04/18/2013   *RADIOLOGY REPORT*  Indication: High probability for pulmonary embolism based on ventilation and perfusion nuclear medicine examination.  Hematuria and left renal hematoma.  The patient is not a candidate for anticoagulation at this time.  PROCEDURE(S): IVC FILTER PLACEMENT; IVC VENOGRAM; ULTRASOUND FOR VASCULAR ACCESS  Physician:  Rachelle Hora. Henn, MD  Medications: Versed 4 mg, Fentanyl 100 mcg. A radiology nurse monitored the patient for moderate sedation.  Moderate sedation time: 33 minutes  Fluoroscopy time:  2 minutes and 36 seconds   Contrast:Carbon dioxide  Procedure:Informed consent was obtained for an IVC venogram and filter placement.  Ultrasound demonstrated a patent right common femoral vein.  Ultrasound images were obtained for documentation. The right groin was prepped and draped in a sterile fashion. Maximal barrier sterile technique was utilized including caps, mask, sterile gowns, sterile gloves, sterile drape, hand hygiene and skin antiseptic.  The skin was anesthetized with 1% lidocaine. A 21 gauge needle was directed into the vein with ultrasound guidance and a micropuncture dilator set was placed.  A wire was advanced into the IVC.  The filter sheath was advanced over the wire into the IVC.  An IVC venogram was performed with carbon dioxide.  Fluoroscopic images were obtained for documentation. The location of the renal veins was confirmed by cannulating the renal veins with a Bentson wire.   A Denali filter was deployed below the lowest renal vein.  The vascular sheath was removed with manual compression.  Findings:IVC was patent.  Bilateral renal veins were identified. The filter was deployed below the lowest renal vein.  Impression:Successful placement of a retrievable IVC filter.   Original Report Authenticated By: Richarda Overlie, M.D.   US Renal  04/19/2013   *RADIOLOGY REPORT*  Clinical Data: Recent transurethral resection of prostate. Hypertension.  Colon cancer.  Ureteral calcification.  RENAL/URINARY TRACT ULTRASOUND COMPLETE  Comparison:  CT abdomen and pelvis 04/16/2013  Findings:  Right Kidney:  Right kidney measures 13.7 cm in length.  There is diffuse parenchymal atrophy with prominent hydronephrosis. Hydronephrosis appears  to be increasing since the previous CT scan.  Left Kidney:  Left kidney measures 13.5 cm length.  Cystic structure off of the upper pole appears to communicate with upper pole calix and may be a cyst or a calyceal diverticulum.  Moderate hydronephrosis of the left kidney.  This is similar to previous  study.  The left ureteral stent is not identified and may have been removed in the interval.  There is a subcapsular fluid collection in the mid pole which appears smaller than on the previous study. This could be hematoma or urinoma.  No pararenal fluid collections.  Bladder:  Bladder volume measures 572 ml.  Foley catheter is in the bladder base and is open to drainage.  Recommend check Foley catheter function.  There is a large heterogeneous filling defect in the bladder measuring 7.8 x 8.2 x 7.2 cm.  This is of mixed but predominate hyperechoic appearance.  No flow is demonstrated on color flow Doppler imaging.  This likely represents a large blood clot.  No definite bladder wall thickening.  IMPRESSION: Large blood clot in the bladder with Foley catheter in place. Bilateral hydronephrosis with progressive hydronephrosis on the right since previous CT scan.  Left renal cyst or calyceal dilatation.  Small subcapsular collection on the left may represent hematoma or urinoma.   Original Report Authenticated By: Burman Nieves, M.D.   Dg Chest Port 1 View  04/18/2013   *RADIOLOGY REPORT*  Clinical Data: Evaluate for edema  PORTABLE CHEST - 1 VIEW  Comparison: 04/16/2013  Findings: Low lung volumes are present taking this into consideration heart size is mildly enlarged and stable.  The mediastinal contour appears unchanged with the right hilum appearing somewhat less prominent than on the prior exam. Mild central peribronchial cuffing and interstitial haziness is seen and this may indicate early or incipient pulmonary edema however the interstitium may be accentuated by low lung volumes as well.  No interstitial septal lines, pleural effusion or overt pulmonary edema is seen. No focal infiltrates are noted.  Old healed rib fracture of the right posterior eighth rib is stable.  IMPRESSION: Subtle changes may indicate early or developing interstitial edema but no overt interstitial or alveolar edema is seen.  No  focal infiltrates.   Original Report Authenticated By: Rhodia Albright, M.D.    I have reviewed the patient's current medications.  Assessment/Plan: 1 AKI/?CKD worsenin mod acidemia, vol xs mild..  Has obstruction, needs drainage.  Discussed with Dr. Annabell Howells.  He feels needs PCNx, Have called IR.  To get PCNx .  Needs Bilat PCNX.  Dialysis not indic until this is done and see if relieves prob. Will change foley also as has clot in bladder. And fluid around. 2 Anemia will follow, check Fe 3 PE, has filter 4 TUR 5 Renal stones P PCNx, check Fe, FFP,  Vit K.  Follow closely.  1 hour spent.    LOS: 1 day   Sorin Frimpong L 04/19/2013,8:09 AM

## 2013-04-19 NOTE — Progress Notes (Signed)
Dr. Annabell Howells at patients bedside. Made aware of slight bleeding around patients catheter. MD aware. Denies pain around catheter but does report low abdominal pain

## 2013-04-19 NOTE — Progress Notes (Addendum)
ANTIBIOTIC CONSULT NOTE - INITIAL  Pharmacy Consult for Vancomycin, Elita Quick Indication: Empiric for urosepsis  Allergies  Allergen Reactions  . Contrast Media [Iodinated Diagnostic Agents]     Blue dye   Patient Measurements: Weight: 282 lb 6.6 oz (128.1 kg) Height : 72 in  IBW: 165 kg  Adjusted Body Weight: 97.8 kg Vital Signs: Temp: 98 F (36.7 C) (09/06 0739) Temp src: Oral (09/06 0739) BP: 107/48 mmHg (09/06 1100) Pulse Rate: 109 (09/06 1100) Intake/Output from previous day: 09/05 0701 - 09/06 0700 In: 4270 [I.V.:170] Out: 3985 [Urine:3985] Labs:  Recent Labs  04/18/13 1245 04/19/13 0511  WBC 18.5* 16.1*  HGB 9.2* 8.5*  PLT 188 189  CREATININE 6.28* 8.27*   CrCl is unknown because there is no height on file for the current visit.  Microbiology: Recent Results (from the past 720 hour(s))  MRSA PCR SCREENING     Status: None   Collection Time    04/18/13 11:07 AM      Result Value Range Status   MRSA by PCR NEGATIVE  NEGATIVE Final   Comment:            The GeneXpert MRSA Assay (FDA     approved for NASAL specimens     only), is one component of a     comprehensive MRSA colonization     surveillance program. It is not     intended to diagnose MRSA     infection nor to guide or     monitor treatment for     MRSA infections.    Medical History: Past Medical History  Diagnosis Date  . HTN (hypertension)   . Ureteral stone   . Colon cancer    Assessment: 72 YOM with renal hematoma to start vancomycin and Fortaz empiric for urosepsis. Patient currently has no UOP and SCr up to 8.27. Plans for OR today for renal hematoma. Currently no HD. WBC up to 16.1. Afebrile. Patient has not had any antibiotic doses prior to this consult.   Urine culture ordered.   Will order STAT one time doses for now and follow-up plans for HD vs observation post-OR.    Goal of Therapy:  Vancomycin trough level 15-20 mcg/ml  Plan:  1. Fortaz 2g IV now. 2. Vancomycin 2g IV  now.  3. Follow-up renal function and possible plans for HD post-OR. Only 1 time doses for now.   Link Snuffer, PharmD, BCPS Clinical Pharmacist [phone] 04/19/2013,11:38 AM  Addendum: Post-op CCM changed order from Fortaz to Zosyn + Levaquin for urosepsis + anaerobic coverage due to extensive abdominal surgery and risk of infection. Patient is hypotensive and Dr. Frederico Hamman wants the broadest coverage. SCr up to 9.79/ CrCL <92ml/min. No HD yet.   Plan: D/C Elita Quick. Zosyn 2.25g IV q8h. Levaquin 750mg  IV x1, then 500mg  IV q48h. Follow-up renal function.   Link Snuffer, PharmD, BCPS Clinical Pharmacist [phone] 8:05 PM, 04/19/2013

## 2013-04-19 NOTE — Anesthesia Preprocedure Evaluation (Addendum)
Anesthesia Evaluation  Patient identified by MRN, date of birth, ID band Patient awake and Patient confused    Reviewed: Allergy & Precautions, H&P , NPO status , Patient's Chart, lab work & pertinent test results  Airway Mallampati: II TM Distance: >3 FB     Dental  (+) Edentulous Upper and Edentulous Lower   Pulmonary  breath sounds clear to auscultation        Cardiovascular Rhythm:Regular Rate:Tachycardia     Neuro/Psych    GI/Hepatic   Endo/Other    Renal/GU      Musculoskeletal   Abdominal (+) + obese,   Peds  Hematology   Anesthesia Other Findings   Reproductive/Obstetrics                           Anesthesia Physical Anesthesia Plan  ASA: III and emergent  Anesthesia Plan: General   Post-op Pain Management:    Induction: Intravenous  Airway Management Planned: Oral ETT  Additional Equipment: Arterial line  Intra-op Plan:   Post-operative Plan: Possible Post-op intubation/ventilation  Informed Consent: I have reviewed the patients History and Physical, chart, labs and discussed the procedure including the risks, benefits and alternatives for the proposed anesthesia with the patient or authorized representative who has indicated his/her understanding and acceptance.     Plan Discussed with: Anesthesiologist and CRNA  Anesthesia Plan Comments:        Anesthesia Quick Evaluation

## 2013-04-19 NOTE — Procedures (Signed)
Central Venous Catheter Insertion Procedure Note Jack [name] [id number] [birth year]  Procedure: Insertion of Central Venous Catheter Indications: Assessment of intravascular volume, Drug and/or fluid administration and Frequent blood sampling  Procedure Details Consent: Unable to obtain consent because of altered level of consciousness. Time Out: Verified patient identification, verified procedure, site/side was marked, verified correct patient position, special equipment/implants available, medications/allergies/relevent history reviewed, required imaging and test results available.  Performed  Maximum sterile technique was used including antiseptics, cap, gloves, gown, hand hygiene, mask and sheet. Skin prep: Chlorhexidine; local anesthetic administered A antimicrobial bonded/coated triple lumen catheter was placed in the right internal jugular vein using the Seldinger technique.  Evaluation Blood flow good Complications: No apparent complications Patient did tolerate procedure well. Chest X-ray ordered to verify placement.  CXR: pending.  Jack [name] R. 04/19/2013, 7:54 PM  I used ultrasound to locate and access the vein/artery.

## 2013-04-19 NOTE — H&P (Signed)
PULMONARY  / CRITICAL CARE MEDICINE  Name: Jack [name] MRN: [id number] DOB: Jun 18, 1946    ADMISSION DATE:  04/18/2013  REFERRING MD :  Joanette Gula PRIMARY SERVICE:  PCCM  CHIEF COMPLAINT:  Acute on chronic renal failure  BRIEF PATIENT DESCRIPTION:  4 M underwent TURP and L ureteral stent 8/29. Admitted to [facility] 9/02 with acute PE and anticoagulation initiated. Hospitalization c/b hematuria, progressive rean linsufficiency and anemia requiring transfusion. Bladder irrigations initiated and CT scan abdomen performed 9/03 revealed L renal fluid subcapsular fluid collection thought to represent hematoma. Transferred to [facility] 9/05 for impending need for HD and placement of IVC filter.   SIGNIFICANT EVENTS / STUDIES:  CT head 9/02:  NAD V/Q scan 9/02: high prob. Multiple unmatched Q defects Echocardiogram9/02: LVEF 5- 55%, RV moderately dialted CT abd 9/03: persistent L pelvocaliectasis/ureterectasis. 7.7 cm L sided subcapsular fluid collection, possible hematoma. Atrophic R kidney. S/P R hemicolectomy  ------------------- Above studies performed at [facility] ------------------------ 9/05 Renal US: Large blood clot in the bladder with Foley catheter in place.  Bilateral hydronephrosis with progressive hydronephrosis on the  right since previous CT scan. Left renal cyst or calyceal  dilatation. Small subcapsular collection on the left may represent  hematoma or urinoma. 9/5 filter placed by IR 9/6- SOB, catheter clotted off  LINES / TUBES: PIV  CULTURES:  ANTIBIOTICS:  SUBJECTIVE: SOB reported  VITAL SIGNS: Temp:  [97.1 F (36.2 C)-98 F (36.7 C)] 98 F (36.7 C) (09/06 0739) Pulse Rate:  [89-109] 109 (09/06 1100) Resp:  [15-30] 20 (09/06 1100) BP: (76-130)/(29-96) 107/48 mmHg (09/06 1100) SpO2:  [94 %-100 %] 100 % (09/06 1100) Weight:  [128.1 kg (282 lb 6.6 oz)] 128.1 kg (282 lb 6.6 oz) (09/06 0500) Reviewed  HEMODYNAMICS:   VENTILATOR SETTINGS:   INTAKE /  OUTPUT: Intake/Output     09/05 0701 - 09/06 0700 09/06 0701 - 09/07 0700   I.V. (mL/kg) 170 (1.3)    Other 4100    Total Intake(mL/kg) 4270 (33.3)    Urine (mL/kg/hr) 3985    Total Output 3985     Net +285            PHYSICAL EXAMINATION: General: pain distress, abdominal distention increased Neuro:  Intactm, nonfocal, anxiety HEENT:  WNL Cardiovascular: RRR s M Lungs: scattered wheezes, bases Abdomen: obese, soft, NABS Ext: trace symmetric edema   LABS:  Labs from [place] reviewed  CBC Recent Labs     04/18/13  1245  04/19/13  0511  WBC  18.5*  16.1*  HGB  9.2*  8.5*  HCT  27.1*  24.3*  PLT  188  189   Coag's Recent Labs     04/18/13  1426  APTT  39*  INR  1.47   BMET Recent Labs     04/18/13  1245  04/19/13  0511  NA  134*  132*  K  4.6  4.8  CL  97  96  CO2  21  18*  BUN  71*  86*  CREATININE  6.28*  8.27*  GLUCOSE  127*  145*   Electrolytes Recent Labs     04/18/13  1245  04/19/13  0511  CALCIUM  7.8*  7.8*  MG  1.9  1.9  PHOS  7.7*  9.4*   Sepsis Markers No results found for this basename: LACTICACIDVEN, PROCALCITON, O2SATVEN,  in the last 72 hours ABG No results found for this basename: PHART, PCO2ART, PO2ART,  in the last  72 hours Liver Enzymes Recent Labs     04/18/13  1245  AST  15  ALT  16  ALKPHOS  65  BILITOT  0.2*  ALBUMIN  2.6*   Cardiac Enzymes Recent Labs     04/18/13  1245  TROPONINI  <0.30  PROBNP  1762.0*   Glucose Recent Labs     04/18/13  1119  04/18/13  1523  04/18/13  1922  04/18/13  2340  04/19/13  0709  GLUCAP  139*  121*  134*  143*  204*    Imaging Images from [place] reviewed  CXR: 9/5 , neg infiltrate  ASSESSMENT / PLAN:  PULMONARY A: Acute hypoxic respiratory failure Acute PE 9/02 Wheezing - suspect due to pulm edema vs PE R/o atx as abdo distention noted P:   CXR repeat with sob R/o in situ PE, unable to anticoagulate IS May need BIPAP support and re assess  symptoms ABG  CARDIOVASCULAR A: tachy, sirs, r/o impending sepsis, r/o mild rxn to ffp P:  Treat pain, re assess pcxr for volume status May need cvp May need steroids, pepcid, benadryl, no fever  RENAL A: Acute renal insufficiency Suspect component of CKD Recurrent kidney stones Recent TURP and ureteral stent (8/29) Renal capsular hematoma Hematuria - bladder irrigation in place on transfer P:   Renal consult appreciated Urology consult here , for OR Drainage then bmet frequent with risk post obstruction diuresis  GASTROINTESTINAL A:  No issues P:   NPO for OR pepcid  HEMATOLOGIC A:  Acute blood loss anemia, PE P:  Recheck CBC now  Has filter Doppler legs ensure no mobile clot  INFECTIOUS A:  Low MAp, r/o obstructive source, likely P:   Blood c and s Ceftaz, vanc stat Urine sample if urine noted  ENDOCRINE A:  No hx of DM P:   CBGs q 8 hrs -  SSI if glu > 180 Tachy, tsh cortisol  NEUROLOGIC A:  Tremor, doubt asterixis, anxiety, impending sepsis, pain Pain P:   Monitor for sign of uremia PRN fentanyl May need versed addition abg  TODAY'S SUMMARY: to OR, add abx, may need hd  I have personally obtained a history, examined the patient, evaluated laboratory and imaging results, formulated the assessment and plan and placed orders. CRITICAL CARE: The patient is critically ill with multiple organ systems failure and requires high complexity decision making for assessment and support, frequent evaluation and titration of therapies, application of advanced monitoring technologies and extensive interpretation of multiple databases. Critical Care Time devoted to patient care services described in this note is 35 minutes.   Mcarthur Rossetti. Tyson Alias, MD, FACP Pgr: [phone] [place] [facility]

## 2013-04-19 NOTE — Transfer of Care (Signed)
Immediate Anesthesia Transfer of Care Note  Patient: Jack [name]  Procedure(s) Performed: Procedure(s) with comments: CYSTOSCOPY WITH STENT PLACEMENT (Bilateral) CYSTOSCOPY WITH CLOT EVACUATION  (N/A) EXPLORATORY LAPAROTOMY (N/A) - Exploratory Laparotomy with evacuation of blood clot in bladder with placement of super pubic tube   Patient Location: ICU  Anesthesia Type:General  Level of Consciousness: sedated and Patient remains intubated per anesthesia plan  Airway & Oxygen Therapy: Patient remains intubated per anesthesia plan and Patient placed on Ventilator (see vital sign flow sheet for setting)  Post-op Assessment: Report given to PACU RN and Post -op Vital signs reviewed and unstable, Anesthesiologist notified  Post vital signs: Reviewed and stable  Complications: No apparent anesthesia complications

## 2013-04-19 NOTE — Op Note (Signed)
NAME:  Jack [name], Jack [name]                   ACCOUNT NO.:  [id number]  MEDICAL RECORD NO.:  [id number]  LOCATION:  2M02C                        FACILITY:  [facility]  PHYSICIAN:  Excell Seltzer. Annabell Howells, M.D.    DATE OF BIRTH:  02-24-1946  DATE OF PROCEDURE: DATE OF DISCHARGE:                              OPERATIVE REPORT   PROCEDURES: 1. Cystoscopy with evacuation of clot. 2. Bilateral double-J stent insertion. 3. Cystogram with interpretation. 4. Open cystotomy with clot evacuation and suprapubic tube placement.  PREOPERATIVE DIAGNOSIS:  Bilateral ureteral obstruction with clot retention.  POSTOPERATIVE DIAGNOSIS:  Bilateral ureteral obstruction with clot retention.  SURGEON:  Excell Seltzer. Annabell Howells, MD  ANESTHESIA:  General.  SPECIMEN:  None.  ESTIMATED BLOOD LOSS:  50 to 100 mL, but an additional 600 mL of clot was removed.  DRAINS:  Bilateral 8 x 26 double-J stents, 22-French coude Foley catheter, and 22-French suprapubic tube.  COMPLICATIONS:  None.  INDICATIONS:  Mr. Brensinger is a 67 year old white male, who underwent a TURP and left ureteroscopic stone extraction with stent on April 13, 2013, at [facility].  He developed pulmonary embolus postoperatively and was started on anticoagulation.  He developed a significant bleed with clot retention and was also found to have a left perirenal hematoma.  He developed acute renal failure, and was transferred to [facility] for further evaluation.  He had a renal ultrasound last night, which demonstrated bilateral hydronephrosis with subcapsular hematoma on the left, and clot in the bladder.  He has essentially been anuric.  Initially, it was felt that Interventional Radiology should place percutaneous nephrostomy tubes, but on review of his ultrasound interventional radiologist, felt that it would not be wise to pass a percutaneous nephrostomy tube through the subcapsular hematoma.  In light of that, it was felt that endoscopic approach was  indicated. He had been given antibiotics preoperatively.  FINDINGS OF PROCEDURE:  He was taken to the operating room where general anesthetic was induced.  He was placed in lithotomy position.  His perineum and genitalia were prepped with Betadine solution.  He was draped in usual sterile fashion.  Cystoscopy was performed with a 22-French scope and 12-degree lens. This revealed some mild urethral irritation in the mid penile urethra from his recent procedure.  The external sphincter was intact.  The prostatic urethra had a prior resection, but no significant bleeding was noted, however, upon entrance into the bladder, there was a large clot noted.  Initially, I was able to evacuate some of this clot approximately 200 mL initially using the cystoscope, but eventually using the resectoscope, however, it became more and more difficult to get clot and on inspection, it had a very leathery appearance, consistent with a matured clot.  I was able to identify the right ureteral orifice and went ahead and passed a wire to the kidney under fluoroscopic guidance.  This was followed by an 8-French 26-cm double-J stent.  Once the stent was in position, the wire was removed, leaving good coil in the kidney, a good coil in the bladder.  I was also able to find the left ureteral stent within the clot and grasped that and pulled at  the urethral meatus where a wire was passed to the kidney and the old stent was removed.  The cystoscope was reinserted over the wire and another 8-French 26-cm double-J stent was passed to the kidney without difficulty under fluoroscopic guidance.  A good coil was noted in the kidney and good coil in the bladder upon removal of the wire.  At this point, I initially felt that I might be able to try the resectoscope with a loop for the evacuation of the residual clots, but I elected to do a cystogram.  Approximately 60 mL of Cystografin were instilled through  the cystoscope.  This revealed bilateral reflux and a very large clot essentially filling the bladder with a rim of contrast.  At this point, it was felt that open evacuation of clot would be most appropriate and expedient.  Of note, upon passage of an open-end catheter to the right kidney, there was a brisk hydronephrotic drip, suggesting active urine production in the kidney which was reassuring, since this kidney had significant atrophy on ultrasound and CT.  Once the decision was made to perform an open conversion, the drapes were removed.  The abdomen was clipped.  He was prepped with Betadine solution and draped in usual sterile fashion.  A lower midline incision was made with a knife.  This was carried down through the subcutaneous tissue and anterior rectus fascia with the Bovie.  The rectus muscles were parted in the midline.  The perivesical fascia was incised and the bladder was identified.  The bladder was opened with the Bovie and once sufficient cystotomy had been created, I was able to remove the clot manually.  Measurement of this portion of clot was approximately 400 mL and the clot was very firm.  Once the clot had been removed, inspection of the bladder revealed no obvious active bleeding, although the mucosa of the bladder was somewhat irritated.  The stents were noted to be in good position as was the Foley catheter.  At this point, a 22-French Foley catheter was brought through a separate stab wound just above and slightly lateral to the abdominal incision. This was carried down through the fascia and brought in to the top of the cystotomy.  The suprapubic balloon was then filled with 10 mL of sterile fluid.  Cystotomy was closed in 2 layers using running 2-0 Vicryl.  Once the bladder had been closed, the balloon from the suprapubic tube was seated in the dome of the bladder and the urethral Foley balloon was filled with an additional 15 mL for a total of 25,  and placed on mild traction.  The bladder was then irrigated, top down using a septa with eventual clearance of the bloody urine.  Of note, after a time there did appear to be urine output from the catheter.  At this point, the anterior rectus fascia was closed with a running #1 PDS.  The wound was irrigated.  Skin was closed with clips.  The drain was secured with a 2-0 nylon suture and a dressing was applied.  The Foley catheter was connected to an irrigation tubing and the suprapubic catheter to a drainage bag and CBI was initiated.  At this point, the patient was taken down from lithotomy position.  His anesthetic was reversed.  He returned to the ICU, intubated, in critical, otherwise stable condition.     Excell Seltzer. Annabell Howells, M.D.     JJW/MEDQ  D:  04/19/2013  T:  04/19/2013  Job:  [id number]

## 2013-04-19 NOTE — Brief Op Note (Signed)
04/18/2013 - 04/19/2013  6:06 PM  PATIENT:  Jack [name]  67 y.o. male  PRE-OPERATIVE DIAGNOSIS:  bilateral hydronephrosis and clot retention  POST-OPERATIVE DIAGNOSIS: same  PROCEDURE:  Procedure(s): CYSTOSCOPY WITH STENT PLACEMENT (Bilateral) CYSTOSCOPY WITH CLOT EVACUATION  (N/A) OPEN CYSTOTOMY for CLOT EVALUATION and SUPRAPUBIC TUBE INSERTION.  SURGEON:  Surgeon(s) and Role:    * Anner Crete, MD - Primary  PHYSICIAN ASSISTANT:   ASSISTANTS: none   ANESTHESIA:   general  EBL:  Total I/O In: 1910 [I.V.:1060; IV Piggyback:850] Out: 0   BLOOD ADMINISTERED:none  DRAINS: Urinary Catheter (Foley), Urinary Catheter (Suprapubic) and Bilateral 8x26 JJ stents   LOCAL MEDICATIONS USED:  NONE  SPECIMEN:  No Specimen  DISPOSITION OF SPECIMEN:  N/A  COUNTS:  YES  TOURNIQUET:  * No tourniquets in log *  DICTATION: .Other Dictation: Dictation Number [id number]  PLAN OF CARE: Admit to inpatient   PATIENT DISPOSITION:  ICU - intubated and critically ill.   Delay start of Pharmacological VTE agent (>24hrs) due to surgical blood loss or risk of bleeding: yes

## 2013-04-19 NOTE — Progress Notes (Signed)
Patient ID: Jack [name], male   DOB: 08/22/1945, 67 y.o.   MRN: [id number]   Mr. Schroeter Cr continues to rise and he remains essentially anuric.   A renal US overnight shows bilateral hydro with right parenchymal atrophy and a large left subcapsular hematoma.   I discussed his case with Dr. Darrick Penna and our initial thought was to place bilateral percs, but Dr. Elmon Kirschner felt that there was increased risk and difficulty with the left subcapsular hematoma and he wasn't sure how much the right would help with the parenchymal atrophy.   He was also noted to have residual clot in the bladder and after further discussion, we have decided to proceed with cystoscopy and bilateral ureteral stent insertion.   He has a left stent that isn't draining and needs to be replaced.   Mr. Hackbart was getting FFP as I walked in this morning and he was having LUQ pain.   This may be secondary to the hematoma and obstruction but the FFP was stopped.  His Coags are only minimally elevated.    I have reviewed his [place] films and recent labs.    I have reviewed the procedure and risks with the family including bleeding, infection, ureteral injury, failure to gain access and anesthetic risks.

## 2013-04-20 ENCOUNTER — Inpatient Hospital Stay (HOSPITAL_COMMUNITY): Payer: PRIVATE HEALTH INSURANCE

## 2013-04-20 DIAGNOSIS — I2699 Other pulmonary embolism without acute cor pulmonale: Secondary | ICD-10-CM

## 2013-04-20 LAB — CBC WITH DIFFERENTIAL/PLATELET
Eosinophils Absolute: 0 10*3/uL (ref 0.0–0.7)
Eosinophils Relative: 0 % (ref 0–5)
HCT: 23.2 % — ABNORMAL LOW (ref 39.0–52.0)
Hemoglobin: 8.2 g/dL — ABNORMAL LOW (ref 13.0–17.0)
Lymphocytes Relative: 8 % — ABNORMAL LOW (ref 12–46)
Lymphs Abs: 0.7 10*3/uL (ref 0.7–4.0)
MCH: 29.3 pg (ref 26.0–34.0)
MCV: 82.9 fL (ref 78.0–100.0)
Monocytes Absolute: 1 10*3/uL (ref 0.1–1.0)
Monocytes Relative: 10 % (ref 3–12)
RBC: 2.8 MIL/uL — ABNORMAL LOW (ref 4.22–5.81)
WBC: 9.4 10*3/uL (ref 4.0–10.5)

## 2013-04-20 LAB — COMPREHENSIVE METABOLIC PANEL
Albumin: 2.4 g/dL — ABNORMAL LOW (ref 3.5–5.2)
Alkaline Phosphatase: 53 U/L (ref 39–117)
BUN: 82 mg/dL — ABNORMAL HIGH (ref 6–23)
Calcium: 7 mg/dL — ABNORMAL LOW (ref 8.4–10.5)
GFR calc Af Amer: 9 mL/min — ABNORMAL LOW (ref >90)
Glucose, Bld: 148 mg/dL — ABNORMAL HIGH (ref 70–99)
Potassium: 3.9 mEq/L (ref 3.5–5.1)
Total Protein: 5.1 g/dL — ABNORMAL LOW (ref 6.0–8.3)

## 2013-04-20 LAB — PROTIME-INR: Prothrombin Time: 16.5 seconds — ABNORMAL HIGH (ref 11.6–15.2)

## 2013-04-20 LAB — POCT I-STAT 7, (LYTES, BLD GAS, ICA,H+H)
Hemoglobin: 7.5 g/dL — ABNORMAL LOW (ref 13.0–17.0)
Patient temperature: 37.6
Potassium: 4.8 mEq/L (ref 3.5–5.1)
TCO2: 19 mmol/L (ref 0–100)
pCO2 arterial: 45.9 mmHg — ABNORMAL HIGH (ref 35.0–45.0)
pH, Arterial: 7.185 — CL (ref 7.350–7.450)

## 2013-04-20 LAB — BASIC METABOLIC PANEL
BUN: 76 mg/dL — ABNORMAL HIGH (ref 6–23)
BUN: 89 mg/dL — ABNORMAL HIGH (ref 6–23)
CO2: 20 mEq/L (ref 19–32)
CO2: 21 mEq/L (ref 19–32)
Chloride: 100 mEq/L (ref 96–112)
Chloride: 102 mEq/L (ref 96–112)
Creatinine, Ser: 6.24 mg/dL — ABNORMAL HIGH (ref 0.50–1.35)
Creatinine, Ser: 7.5 mg/dL — ABNORMAL HIGH (ref 0.50–1.35)
GFR calc Af Amer: 8 mL/min — ABNORMAL LOW (ref >90)
GFR calc non Af Amer: 10 mL/min — ABNORMAL LOW (ref >90)
Glucose, Bld: 127 mg/dL — ABNORMAL HIGH (ref 70–99)
Potassium: 3.8 mEq/L (ref 3.5–5.1)
Potassium: 4 mEq/L (ref 3.5–5.1)
Sodium: 137 mEq/L (ref 135–145)
Sodium: 138 mEq/L (ref 135–145)

## 2013-04-20 LAB — CBC
HCT: 19.7 % — ABNORMAL LOW (ref 39.0–52.0)
HCT: 22.5 % — ABNORMAL LOW (ref 39.0–52.0)
HCT: 22.7 % — ABNORMAL LOW (ref 39.0–52.0)
Hemoglobin: 6.7 g/dL — CL (ref 13.0–17.0)
MCH: 27.7 pg (ref 26.0–34.0)
MCH: 28.8 pg (ref 26.0–34.0)
MCH: 28.9 pg (ref 26.0–34.0)
MCH: 29 pg (ref 26.0–34.0)
MCHC: 35.1 g/dL (ref 30.0–36.0)
MCHC: 34.8 g/dL (ref 30.0–36.0)
MCV: 81.4 fL (ref 78.0–100.0)
MCV: 82.1 fL (ref 78.0–100.0)
MCV: 83.2 fL (ref 78.0–100.0)
MCV: 83.2 fL (ref 78.0–100.0)
Platelets: 177 10*3/uL (ref 150–400)
RBC: 2.42 MIL/uL — ABNORMAL LOW (ref 4.22–5.81)
RBC: 2.79 MIL/uL — ABNORMAL LOW (ref 4.22–5.81)
RDW: 15 % (ref 11.5–15.5)
RDW: 15.2 % (ref 11.5–15.5)
RDW: 15.3 % (ref 11.5–15.5)
WBC: 8.7 10*3/uL (ref 4.0–10.5)

## 2013-04-20 LAB — RENAL FUNCTION PANEL
Albumin: 2.3 g/dL — ABNORMAL LOW (ref 3.5–5.2)
BUN: 72 mg/dL — ABNORMAL HIGH (ref 6–23)
Calcium: 7.3 mg/dL — ABNORMAL LOW (ref 8.4–10.5)
Creatinine, Ser: 5.08 mg/dL — ABNORMAL HIGH (ref 0.50–1.35)
Phosphorus: 5.8 mg/dL — ABNORMAL HIGH (ref 2.3–4.6)

## 2013-04-20 LAB — POCT I-STAT 3, ART BLOOD GAS (G3+)
Bicarbonate: 22 mEq/L (ref 20.0–24.0)
O2 Saturation: 99 %
TCO2: 23 mmol/L (ref 0–100)
pCO2 arterial: 36.3 mmHg (ref 35.0–45.0)
pH, Arterial: 7.389 (ref 7.350–7.450)
pO2, Arterial: 136 mmHg — ABNORMAL HIGH (ref 80.0–100.0)

## 2013-04-20 LAB — PREPARE FRESH FROZEN PLASMA

## 2013-04-20 LAB — POCT I-STAT 4, (NA,K, GLUC, HGB,HCT): Potassium: 4.9 mEq/L (ref 3.5–5.1)

## 2013-04-20 LAB — APTT: aPTT: 35 seconds (ref 24–37)

## 2013-04-20 MED ORDER — BIOTENE DRY MOUTH MT LIQD
15.0000 mL | Freq: Two times a day (BID) | OROMUCOSAL | Status: DC
  Administered 2013-04-20 – 2013-04-21 (×3): 15 mL via OROMUCOSAL

## 2013-04-20 MED ORDER — SODIUM BICARBONATE 8.4 % IV SOLN
50.0000 meq | Freq: Once | INTRAVENOUS | Status: AC
  Administered 2013-04-20: 50 meq via INTRAVENOUS

## 2013-04-20 MED ORDER — NOREPINEPHRINE BITARTRATE 1 MG/ML IJ SOLN
2.0000 ug/min | INTRAVENOUS | Status: DC
  Administered 2013-04-20: 8 ug/min via INTRAVENOUS
  Filled 2013-04-20 (×3): qty 4

## 2013-04-20 MED ORDER — DEXTROSE 5 % IV SOLN
1.0000 g | INTRAVENOUS | Status: DC
  Administered 2013-04-20 – 2013-04-21 (×2): 1 g via INTRAVENOUS
  Filled 2013-04-20 (×2): qty 1

## 2013-04-20 MED ORDER — SODIUM BICARBONATE 8.4 % IV SOLN
INTRAVENOUS | Status: DC
  Administered 2013-04-20: 01:00:00 via INTRAVENOUS
  Filled 2013-04-20 (×3): qty 150

## 2013-04-20 MED ORDER — SODIUM CHLORIDE 0.9 % IV SOLN: 250.0000 mL | INTRAVENOUS | Status: DC | PRN

## 2013-04-20 MED ORDER — FENTANYL CITRATE 0.05 MG/ML IJ SOLN: 25.0000 ug | INTRAMUSCULAR | Status: DC | PRN

## 2013-04-20 MED ORDER — CHLORHEXIDINE GLUCONATE 0.12 % MT SOLN
15.0000 mL | Freq: Two times a day (BID) | OROMUCOSAL | Status: DC
  Administered 2013-04-20 – 2013-04-21 (×3): 15 mL via OROMUCOSAL
  Filled 2013-04-20 (×3): qty 15

## 2013-04-20 MED ORDER — SODIUM CHLORIDE 0.9 % IV SOLN
250.0000 mL | INTRAVENOUS | Status: DC | PRN
  Administered 2013-04-21: 500 mL via INTRAVENOUS

## 2013-04-20 MED ORDER — SODIUM CHLORIDE 0.9 % IV BOLUS (SEPSIS)
1000.0000 mL | Freq: Once | INTRAVENOUS | Status: AC
  Administered 2013-04-20: 1000 mL via INTRAVENOUS

## 2013-04-20 NOTE — Progress Notes (Signed)
VASCULAR LAB PRELIMINARY  PRELIMINARY  PRELIMINARY  PRELIMINARY  Bilateral lower extremity venous Dopplers completed.    Preliminary report:  There is no DVT or SVT noted in the bilateral lower extremities.  Alixandra Alfieri, RVT 04/20/2013, 9:40 AM

## 2013-04-20 NOTE — Progress Notes (Signed)
CRITICAL VALUE ALERT  Critical value received:  Hemoglobin = 6.7  Date of notification: 04-20-13 Time of notification:  0020  Critical value read back:yes  Nurse who received alert:  Corliss Skains RN  MD notified (1st page):  Dr. Curt Bears, Dr. Belinda Block  Time of first page:  0021  MD notified (2nd page):  Time of second page:  Responding MD: Dr. Belinda Block  Time MD responded:  [phone]

## 2013-04-20 NOTE — H&P (Signed)
PULMONARY  / CRITICAL CARE MEDICINE  Name: Jack [name] MRN: [id number] DOB: 17-May-1946    ADMISSION DATE:  04/18/2013  REFERRING MD :  Joanette Gula PRIMARY SERVICE:  PCCM  CHIEF COMPLAINT:  Acute on chronic renal failure  BRIEF PATIENT DESCRIPTION:  39 M underwent TURP and L ureteral stent 8/29. Admitted to [facility] 9/02 with acute PE and anticoagulation initiated. Hospitalization c/b hematuria, progressive rean linsufficiency and anemia requiring transfusion. Bladder irrigations initiated and CT scan abdomen performed 9/03 revealed L renal fluid subcapsular fluid collection thought to represent hematoma. Transferred to [facility] 9/05 for impending need for HD and placement of IVC filter.   SIGNIFICANT EVENTS / STUDIES:  CT head 9/02:  NAD V/Q scan 9/02: high prob. Multiple unmatched Q defects Echocardiogram9/02: LVEF 5- 55%, RV moderately dialted CT abd 9/03: persistent L pelvocaliectasis/ureterectasis. 7.7 cm L sided subcapsular fluid collection, possible hematoma. Atrophic R kidney. S/P R hemicolectomy  ------------------- Above studies performed at [facility] ------------------------ 9/05 Renal US: Large blood clot in the bladder with Foley catheter in place.  Bilateral hydronephrosis with progressive hydronephrosis on the  right since previous CT scan. Left renal cyst or calyceal  dilatation. Small subcapsular collection on the left may represent  hematoma or urinoma. 9/5 filter placed by IR 9/6- SOB, catheter clotted off 9/6- bilateral stents placed, increase output  LINES / TUBES: 9/6 ett>>> 9/6 rt ij >>> 9/6 left radial>>>  CULTURES: 9/6 urine>>> 9/6 BC x2>>>  ANTIBIOTICS: 9/6 ceftaz>>> 9/6 vanc>>>  SUBJECTIVE: remains ett  VITAL SIGNS: Temp:  [97.3 F (36.3 C)-98.8 F (37.1 C)] 97.7 F (36.5 C) (09/07 0727) Pulse Rate:  [71-120] 72 (09/07 0830) Resp:  [0-30] 24 (09/07 0830) BP: (37-130)/(12-94) 107/92 mmHg (09/07 0800) SpO2:  [89 %-100 %] 100 % (09/07  0850) Arterial Line BP: (130-151)/(44-56) 134/44 mmHg (09/07 0830) FiO2 (%):  [40 %-50 %] 40 % (09/07 0800) Reviewed  HEMODYNAMICS: CVP:  [5 mmHg] 5 mmHg VENTILATOR SETTINGS: Vent Mode:  [-] PRVC FiO2 (%):  [40 %-50 %] 40 % Set Rate:  [14 bmp-24 bmp] 24 bmp Vt Set:  [500 mL-600 mL] 500 mL PEEP:  [5 cmH20] 5 cmH20 Plateau Pressure:  [18 cmH20-21 cmH20] 18 cmH20 INTAKE / OUTPUT: Intake/Output     09/06 0701 - 09/07 0700 09/07 0701 - 09/08 0700   I.V. (mL/kg) 3288.3 (25.7) 125 (1)   Blood 600    Other  5000   IV Piggyback 950 1233.3   Total Intake(mL/kg) 4838.3 (37.8) 6358.3 (49.6)   Urine (mL/kg/hr)  9965 (33)   Blood 150 (0)    Total Output 150 9965   Net +4688.3 -3606.7          PHYSICAL EXAMINATION: General: calm ett Neuro: rass -2, not following commands HEENT:  WNL Cardiovascular: RRR s M Lungs: reduced Abdomen: obese, soft, NABS Ext: trace symmetric edema, pending dopplers   LABS:  Labs from [place] reviewed  CBC Recent Labs     04/19/13  2026 04/20/13  04/20/13  0430  WBC  16.8*  14.1*  8.7  HGB  7.1*  6.7*  7.9*  HCT  20.8*  19.7*  22.5*  PLT  170  178  145*   Coag's Recent Labs     04/18/13  1426  04/19/13  1136  04/20/13  0529  APTT  39*   --   35  INR  1.47  1.39  1.37   BMET Recent Labs    04/20/13  04/20/13  0529  04/20/13  0736  NA  133*  137  137  K  4.4  3.9  3.8  CL  100  102  102  CO2  15*  20  20  BUN  89*  82*  77*  CREATININE  7.50*  6.88*  6.24*  GLUCOSE  142*  148*  131*   Electrolytes Recent Labs     04/18/13  1245  04/19/13  0511  04/19/13  1136   04/19/13  1715  04/20/13  04/20/13  0529  04/20/13  0736  CALCIUM  7.8*  7.8*  7.7*   < >  7.5*   < >  6.8*  7.0*  6.9*  MG  1.9  1.9  1.9   --    --    --    --    --    --   PHOS  7.7*  9.4*   --    --   9.6*   --    --    --    --    < > = values in this interval not displayed.   Sepsis Markers No results found for this basename: LACTICACIDVEN, PROCALCITON,  O2SATVEN,  in the last 72 hours ABG Recent Labs     04/19/13  1945  04/19/13  2147  04/20/13  0755  PHART  7.173*  7.204*  7.389  PCO2ART  49.6*  42.4  36.3  PO2ART  87.9  98.8  136.0*   Liver Enzymes Recent Labs     04/18/13  1245  04/19/13  1715  04/19/13  2026  04/20/13  0529  AST  15   --   10  10  ALT  16   --   9  9  ALKPHOS  65   --   59  53  BILITOT  0.2*   --   0.2*  0.3  ALBUMIN  2.6*  2.3*  2.2*  2.4*   Cardiac Enzymes Recent Labs     04/18/13  1245  TROPONINI  <0.30  PROBNP  1762.0*   Glucose Recent Labs     04/18/13  1119  04/18/13  1523  04/18/13  1922  04/18/13  2340  04/19/13  0709  GLUCAP  139*  121*  134*  143*  204*    Imaging Ipcxr- atx, ett wnl, line wnl  CXR: 9/5 , neg infiltrate  ASSESSMENT / PLAN:  PULMONARY A: Acute hypoxic respiratory failure Acute PE 9/02 atx as abdo distention noted P:   WUA planne ABg reviewed, maintain current vent settings sbt now, cpap 5 ps 5, goal 1 hr Upright position pcxr in am   CARDIOVASCULAR A: r/o septic shock vs hypotension from sedation P:  cvp assessment, especially with output noted Lactic acid re assuring Dopamine on board, may also increase output, will NOT change outcome and may harm with post obstructive diureis Transition to levophed if pressors still required Cortisol 36, no role roids Trop x 1 Ensure ecg x 1 done  RENAL A: Acute renal failure Suspect component of CKD Recurrent kidney stones Recent TURP and ureteral stent (8/29) Renal capsular hematoma Hematuria S/p stents bilateral High risk post obstructive diuresis P:   Renal consult appreciated Bladder irrigation on going per urology Chem frequent Consider dc bicarb Keep up with losses, even balance goal, cvp 10  GASTROINTESTINAL A:  NPO P:   NPO, start T Fin am if not extubated pepcid  HEMATOLOGIC A:  Acute blood loss anemia, PE P:  Recheck CBC q12h  Has filter Doppler legs ensure no mobile clot -  pending  INFECTIOUS A:  r/o obstructive source, likely P:   Dc zosyn Ceftaz, vanc   ENDOCRINE tsh wnl A:  No hx of DM P:   CBGs  NEUROLOGIC A:  Tremor, doubt asterixis, anxiety, vent dyschrony, pain Pain P:   fent wua upright  TODAY'S SUMMARY: wean to extubate if able, monitor for post obstructive diuresis, continue bladder irriagtion, contnu eabx  I have personally obtained a history, examined the patient, evaluated laboratory and imaging results, formulated the assessment and plan and placed orders. CRITICAL CARE: The patient is critically ill with multiple organ systems failure and requires high complexity decision making for assessment and support, frequent evaluation and titration of therapies, application of advanced monitoring technologies and extensive interpretation of multiple databases. Critical Care Time devoted to patient care services described in this note is 30 minutes.   Mcarthur Rossetti. Tyson Alias, MD, FACP Pgr: [phone] [facility]

## 2013-04-20 NOTE — Progress Notes (Signed)
Patient ID: NEEMA FLUEGGE, male   DOB: [birth year], 67 y.o.   MRN: [id number] 1 Day Post-Op  Subjective: Mr. Winkowski remains on the vent but his renal function is improving and he is making 40-80cc/hr of urine.  His urine is light pink on CBI.  ROS: On Vent  Objective: Vital signs in last 24 hours: Temp:  [97.3 F (36.3 C)-98.8 F (37.1 C)] 97.7 F (36.5 C) (09/07 0727) Pulse Rate:  [71-120] 79 (09/07 0930) Resp:  [0-30] 14 (09/07 0930) BP: (37-130)/(12-94) 120/58 mmHg (09/07 0900) SpO2:  [89 %-100 %] 100 % (09/07 0930) Arterial Line BP: (123-151)/(41-56) 134/42 mmHg (09/07 0930) FiO2 (%):  [30 %-50 %] 30 % (09/07 0845)  Intake/Output from previous day: 09/06 0701 - 09/07 0700 In: 4838.3 [I.V.:3288.3; Blood:600; IV Piggyback:950] Out: 150 [Blood:150] Intake/Output this shift: Total I/O In: 6630.3 [I.V.:397; Other:5000; IV Piggyback:1233.3] Out: 9965 [Urine:9965]  His wound is intact without erythema and the SP tube is draining well on CBI.   Lab Results:   Recent Labs  04/20/13 04/20/13 0430  WBC 14.1* 8.7  HGB 6.7* 7.9*  HCT 19.7* 22.5*  PLT 178 145*   BMET  Recent Labs  04/20/13 0529 04/20/13 0736  NA 137 137  K 3.9 3.8  CL 102 102  CO2 20 20  GLUCOSE 148* 131*  BUN 82* 77*  CREATININE 6.88* 6.24*  CALCIUM 7.0* 6.9*   PT/INR  Recent Labs  04/19/13 1136 04/20/13 0529  LABPROT 16.7* 16.5*  INR 1.39 1.37   ABG  Recent Labs  04/19/13 2147 04/20/13 0755  PHART 7.204* 7.389  HCO3 16.1* 22.0    Studies/Results: Ir Ivc Filter Plmt / S&i /img Guid/mod Sed  04/18/2013   *RADIOLOGY REPORT*  Indication: High probability for pulmonary embolism based on ventilation and perfusion nuclear medicine examination.  Hematuria and left renal hematoma.  The patient is not a candidate for anticoagulation at this time.  PROCEDURE(S): IVC FILTER PLACEMENT; IVC VENOGRAM; ULTRASOUND FOR VASCULAR ACCESS  Physician:  Rachelle Hora. Henn, MD  Medications: Versed 4 mg, Fentanyl 100  mcg. A radiology nurse monitored the patient for moderate sedation.  Moderate sedation time: 33 minutes  Fluoroscopy time:  2 minutes and 36 seconds  Contrast:Carbon dioxide  Procedure:Informed consent was obtained for an IVC venogram and filter placement.  Ultrasound demonstrated a patent right common femoral vein.  Ultrasound images were obtained for documentation. The right groin was prepped and draped in a sterile fashion. Maximal barrier sterile technique was utilized including caps, mask, sterile gowns, sterile gloves, sterile drape, hand hygiene and skin antiseptic.  The skin was anesthetized with 1% lidocaine. A 21 gauge needle was directed into the vein with ultrasound guidance and a micropuncture dilator set was placed.  A wire was advanced into the IVC.  The filter sheath was advanced over the wire into the IVC.  An IVC venogram was performed with carbon dioxide.  Fluoroscopic images were obtained for documentation. The location of the renal veins was confirmed by cannulating the renal veins with a Bentson wire.   A Denali filter was deployed below the lowest renal vein.  The vascular sheath was removed with manual compression.  Findings:IVC was patent.  Bilateral renal veins were identified. The filter was deployed below the lowest renal vein.  Impression:Successful placement of a retrievable IVC filter.   Original Report Authenticated By: Richarda Overlie, M.D.   Dg Retrograde Pyelogram  04/20/2013   *RADIOLOGY REPORT*  Clinical Data: Ureteral obstruction, hydronephrosis with acute  kidney failure  RETROGRADE PYELOGRAM  Comparison: CT abdomen pelvis - 04/16/2013  Fluoroscopy time:  37 seconds  Findings:  A single spot intraoperative image of the lower pelvis is provided for review.  A cystoscope overlies the expected location of the urinary bladder.  Image demonstrates the caudal aspect of bilateral double J ureteral stents.   There is minimal contrast opacification of the distal aspect of the bilateral  ureters.  The urinary bladder is underdistended but there is the suggestion of a persistent filling defect within the urinary bladder compatible with known bladder hematoma.  IMPRESSION: Post bilateral double-J ureteral stent placement.   Original Report Authenticated By: Tacey Ruiz, MD   US Renal  04/19/2013   *RADIOLOGY REPORT*  Clinical Data: Recent transurethral resection of prostate. Hypertension.  Colon cancer.  Ureteral calcification.  RENAL/URINARY TRACT ULTRASOUND COMPLETE  Comparison:  CT abdomen and pelvis 04/16/2013  Findings:  Right Kidney:  Right kidney measures 13.7 cm in length.  There is diffuse parenchymal atrophy with prominent hydronephrosis. Hydronephrosis appears to be increasing since the previous CT scan.  Left Kidney:  Left kidney measures 13.5 cm length.  Cystic structure off of the upper pole appears to communicate with upper pole calix and may be a cyst or a calyceal diverticulum.  Moderate hydronephrosis of the left kidney.  This is similar to previous study.  The left ureteral stent is not identified and may have been removed in the interval.  There is a subcapsular fluid collection in the mid pole which appears smaller than on the previous study. This could be hematoma or urinoma.  No pararenal fluid collections.  Bladder:  Bladder volume measures 572 ml.  Foley catheter is in the bladder base and is open to drainage.  Recommend check Foley catheter function.  There is a large heterogeneous filling defect in the bladder measuring 7.8 x 8.2 x 7.2 cm.  This is of mixed but predominate hyperechoic appearance.  No flow is demonstrated on color flow Doppler imaging.  This likely represents a large blood clot.  No definite bladder wall thickening.  IMPRESSION: Large blood clot in the bladder with Foley catheter in place. Bilateral hydronephrosis with progressive hydronephrosis on the right since previous CT scan.  Left renal cyst or calyceal dilatation.  Small subcapsular collection on  the left may represent hematoma or urinoma.   Original Report Authenticated By: Burman Nieves, M.D.   Dg Chest Port 1 View  04/20/2013   *RADIOLOGY REPORT*  Clinical Data: Evaluate pulmonary edema  PORTABLE CHEST - 1 VIEW  Comparison: 04/19/2013; 04/18/2013  Findings:  Grossly unchanged enlarged cardiac silhouette and mediastinal contours given persistently reduced lung volumes.  Stable positioning of support apparatus.  No pneumothorax. Mild cephalization of flow without frank evidence of edema.  Trace bilateral effusions are not excluded. No change to slight worsening in bibasilar heterogeneous opacities, left greater than right. There is unchanged deformity involving the posterior lateral aspect of the right eighth and ninth ribs.  No acute osseous abnormalities.  IMPRESSION: 1.  Stable positioning of support apparatus.  No pneumothorax. 2.  Persistent findings of cardiomegaly, hypoventilation and pulmonary venous congestion without frank evidence of edema.  3.  No change to slight worsening of bibasilar opacities, left greater than right, likely atelectasis.   Original Report Authenticated By: Tacey Ruiz, MD   Portable Chest Xray  04/19/2013   *RADIOLOGY REPORT*  Clinical Data: Endotracheal tube placement and central line placement.  PORTABLE CHEST - 1 VIEW  Comparison:  Earlier today at 12:09 p.m.  Findings: Mildly degraded exam due to AP portable technique and patient body habitus.  Endotracheal tube 3.0 cm above carina.  Nasogastric tube extends beyond the  inferior aspect of the film.  Right IJ central line difficult to visualize centrally.  Followed to at least the level of the mid right atrium.  No pneumothorax.  Remote right rib trauma. Cardiomegaly accentuated by AP portable technique.  Left costophrenic angle  excluded.  No definite pleural fluid. No congestive failure.  Mild right base volume loss.  IMPRESSION: Endotracheal tube appropriately positioned.  Right internal jugular line which is  difficult to visualize centrally.  Followed to at least the level of the mid right atrium. Recommend retraction approximately 5.5 cm with repeat radiograph.  Cardiomegaly and low lung volumes, without congestive failure   Original Report Authenticated By: Jeronimo Greaves, M.D.   Dg Chest Port 1 View  04/19/2013   *RADIOLOGY REPORT*  Clinical Data: Evaluate for pulmonary edema.  PORTABLE CHEST - 1 VIEW  Comparison: Chest x-ray 04/18/2013.  Findings: Lung volumes are normal.  No consolidative airspace disease.  No pleural effusions.  No pneumothorax.  No pulmonary nodule or mass noted.  Pulmonary vasculature and the cardiomediastinal silhouette are within normal limits.  IMPRESSION: 1. No radiographic evidence of acute cardiopulmonary disease. Specifically, no evidence of pulmonary edema at this time.   Original Report Authenticated By: Trudie Reed, M.D.   Dg Chest Port 1 View  04/18/2013   *RADIOLOGY REPORT*  Clinical Data: Evaluate for edema  PORTABLE CHEST - 1 VIEW  Comparison: 04/16/2013  Findings: Low lung volumes are present taking this into consideration heart size is mildly enlarged and stable.  The mediastinal contour appears unchanged with the right hilum appearing somewhat less prominent than on the prior exam. Mild central peribronchial cuffing and interstitial haziness is seen and this may indicate early or incipient pulmonary edema however the interstitium may be accentuated by low lung volumes as well.  No interstitial septal lines, pleural effusion or overt pulmonary edema is seen. No focal infiltrates are noted.  Old healed rib fracture of the right posterior eighth rib is stable.  IMPRESSION: Subtle changes may indicate early or developing interstitial edema but no overt interstitial or alveolar edema is seen.  No focal infiltrates.   Original Report Authenticated By: Rhodia Albright, M.D.    Anti-infectives: Anti-infectives   Start     Dose/Rate Route Frequency Ordered Stop   04/21/13 2000   levofloxacin (LEVAQUIN) IVPB 500 mg  Status:  Discontinued     500 mg 100 mL/hr over 60 Minutes Intravenous Every 48 hours 04/19/13 2004 04/20/13 0926   04/20/13 1100  cefTAZidime (FORTAZ) 1 g in dextrose 5 % 50 mL IVPB     1 g 100 mL/hr over 30 Minutes Intravenous Every 24 hours 04/20/13 1001     04/19/13 2015  piperacillin-tazobactam (ZOSYN) IVPB 2.25 g  Status:  Discontinued     2.25 g 100 mL/hr over 30 Minutes Intravenous 3 times per day 04/19/13 2004 04/20/13 0943   04/19/13 2015  levofloxacin (LEVAQUIN) IVPB 750 mg     750 mg 100 mL/hr over 90 Minutes Intravenous  Once 04/19/13 2004 04/19/13 2200   04/19/13 1145  cefTAZidime (FORTAZ) 2 g in dextrose 5 % 50 mL IVPB     2 g 100 mL/hr over 30 Minutes Intravenous STAT 04/19/13 1145 04/19/13 1305   04/19/13 1145  vancomycin (VANCOCIN) 2,000 mg in sodium chloride 0.9 % 500 mL  IVPB     2,000 mg 250 mL/hr over 120 Minutes Intravenous STAT 04/19/13 1145 04/19/13 1533      Current Facility-Administered Medications  Medication Dose Route Frequency Provider Last Rate Last Dose  . 0.9 %  sodium chloride infusion  250 mL Intravenous PRN Merwyn Katos, MD      . 0.9 %  sodium chloride infusion  250 mL Intravenous PRN Trevor Iha, MD      . albuterol (PROVENTIL) (5 MG/ML) 0.5% nebulizer solution 2.5 mg  2.5 mg Nebulization Q3H PRN Merwyn Katos, MD      . albuterol (PROVENTIL) (5 MG/ML) 0.5% nebulizer solution 2.5 mg  2.5 mg Nebulization Q6H Merwyn Katos, MD   2.5 mg at 04/20/13 0848  . albuterol (PROVENTIL) (5 MG/ML) 0.5% nebulizer solution 2.5 mg  2.5 mg Nebulization Q2H PRN Merwyn Katos, MD      . antiseptic oral rinse (BIOTENE) solution 15 mL  15 mL Mouth Rinse QID Roxine Caddy, MD   15 mL at 04/20/13 0400  . cefTAZidime (FORTAZ) 1 g in dextrose 5 % 50 mL IVPB  1 g Intravenous Q24H Thuy Dien Dang, [facility]      . chlorhexidine (PERIDEX) 0.12 % solution 15 mL  15 mL Mouth Rinse BID Roxine Caddy, MD   15 mL at 04/20/13 0749  .  famotidine (PEPCID) IVPB 20 mg  20 mg Intravenous Q24H Nelda Bucks, MD   20 mg at 04/19/13 1235  . fentaNYL (SUBLIMAZE) 10 mcg/mL in sodium chloride 0.9 % 250 mL infusion  25-300 mcg/hr Intravenous Titrated Roxine Caddy, MD 10 mL/hr at 04/20/13 0800 100 mcg/hr at 04/20/13 0800   And  . fentaNYL (SUBLIMAZE) bolus via infusion 25-100 mcg  25-100 mcg Intravenous Q6H PRN Roxine Caddy, MD      . HYDROmorphone (DILAUDID) injection 1 mg  1 mg Intravenous Q4H PRN Trevor Iha, MD   1 mg at 04/19/13 0825  . ipratropium (ATROVENT) nebulizer solution 0.5 mg  0.5 mg Nebulization Q6H Merwyn Katos, MD   0.5 mg at 04/20/13 0848  . midazolam (VERSED) 1 mg/mL in sodium chloride 0.9 % 50 mL infusion  1-10 mg/hr Intravenous Titrated Roxine Caddy, MD   2 mg/hr at 04/20/13 0800   And  . midazolam (VERSED) bolus via infusion 1-2 mg  1-2 mg Intravenous Q2H PRN Roxine Caddy, MD   2 mg at 04/20/13 0055  . norepinephrine (LEVOPHED) 4 mg in dextrose 5 % 250 mL infusion  2-50 mcg/min Intravenous Continuous Nelda Bucks, MD      . pantoprazole (PROTONIX) injection 40 mg  40 mg Intravenous Daily Roxine Caddy, MD   40 mg at 04/19/13 2236  . sodium bicarbonate 150 mEq in dextrose 5 % 1,000 mL infusion   Intravenous Continuous Carolan Clines, MD      . sodium chloride 0.9 % injection 3 mL  3 mL Intravenous Q12H Merwyn Katos, MD   3 mL at 04/19/13 2200  . sodium chloride 0.9 % injection 3 mL  3 mL Intravenous PRN Merwyn Katos, MD       Labs reviewed.   Assessment: s/p Procedure(s): CYSTOSCOPY WITH STENT PLACEMENT CYSTOSCOPY WITH CLOT EVACUATION  EXPLORATORY LAPAROTOMY  He remains on the vent in critical condition but is no longer actively bleeding and his renal function is improving.  Plan: I will continue to follow and will probably D/C the CBI and urethral  foley tomorrow.       LOS: 2 days    Anner Crete 04/20/2013

## 2013-04-20 NOTE — Progress Notes (Signed)
Subjective: Interval History: entub,.sedated, post op. .  Objective: Vital signs in last 24 hours: Temp:  [97.3 F (36.3 C)-98.8 F (37.1 C)] 97.7 F (36.5 C) (09/07 0727) Pulse Rate:  [71-120] 79 (09/07 1015) Resp:  [0-30] 14 (09/07 1015) BP: (37-130)/(12-94) 116/56 mmHg (09/07 1000) SpO2:  [89 %-100 %] 100 % (09/07 1015) Arterial Line BP: (115-151)/(39-56) 120/39 mmHg (09/07 1015) FiO2 (%):  [30 %-50 %] 30 % (09/07 0845) Weight change:   Intake/Output from previous day: 09/06 0701 - 09/07 0700 In: 4838.3 [I.V.:3288.3; Blood:600; IV Piggyback:950] Out: 150 [Blood:150] Intake/Output this shift: Total I/O In: 6633.3 [I.V.:400; Other:5000; IV Piggyback:1233.3] Out: 14782 [Urine:11665]  General appearance: pale and pale, sedated on vent,  Resp: diminished breath sounds bilaterally, rales bibasilar and rhonchi bilaterally Cardio: regular rate and rhythm and systolic murmur: holosystolic 2/6, blowing at apex GI: no bs, distended, SP tube Extremities: edema 4+  Lab Results:  Recent Labs  04/20/13 04/20/13 0430  WBC 14.1* 8.7  HGB 6.7* 7.9*  HCT 19.7* 22.5*  PLT 178 145*   BMET:  Recent Labs  04/20/13 0529 04/20/13 0736  NA 137 137  K 3.9 3.8  CL 102 102  CO2 20 20  GLUCOSE 148* 131*  BUN 82* 77*  CREATININE 6.88* 6.24*  CALCIUM 7.0* 6.9*   No results found for this basename: PTH,  in the last 72 hours Iron Studies: No results found for this basename: IRON, TIBC, TRANSFERRIN, FERRITIN,  in the last 72 hours  Studies/Results: Ir Ivc Filter Plmt / S&i /img Guid/mod Sed  04/18/2013   *RADIOLOGY REPORT*  Indication: High probability for pulmonary embolism based on ventilation and perfusion nuclear medicine examination.  Hematuria and left renal hematoma.  The patient is not a candidate for anticoagulation at this time.  PROCEDURE(S): IVC FILTER PLACEMENT; IVC VENOGRAM; ULTRASOUND FOR VASCULAR ACCESS  Physician:  Jack Hora. Henn, MD  Medications: Versed 4 mg, Fentanyl 100  mcg. A radiology nurse monitored the patient for moderate sedation.  Moderate sedation time: 33 minutes  Fluoroscopy time:  2 minutes and 36 seconds  Contrast:Carbon dioxide  Procedure:Informed consent was obtained for an IVC venogram and filter placement.  Ultrasound demonstrated a patent right common femoral vein.  Ultrasound images were obtained for documentation. The right groin was prepped and draped in a sterile fashion. Maximal barrier sterile technique was utilized including caps, mask, sterile gowns, sterile gloves, sterile drape, hand hygiene and skin antiseptic.  The skin was anesthetized with 1% lidocaine. A 21 gauge needle was directed into the vein with ultrasound guidance and a micropuncture dilator set was placed.  A wire was advanced into the IVC.  The filter sheath was advanced over the wire into the IVC.  An IVC venogram was performed with carbon dioxide.  Fluoroscopic images were obtained for documentation. The location of the renal veins was confirmed by cannulating the renal veins with a Bentson wire.   A Denali filter was deployed below the lowest renal vein.  The vascular sheath was removed with manual compression.  Findings:IVC was patent.  Bilateral renal veins were identified. The filter was deployed below the lowest renal vein.  Impression:Successful placement of a retrievable IVC filter.   Original Report Authenticated By: Jack [name], M.D.   Dg Retrograde Pyelogram  04/20/2013   *RADIOLOGY REPORT*  Clinical Data: Ureteral obstruction, hydronephrosis with acute kidney failure  RETROGRADE PYELOGRAM  Comparison: CT abdomen pelvis - 04/16/2013  Fluoroscopy time:  37 seconds  Findings:  A single spot intraoperative  image of the lower pelvis is provided for review.  A cystoscope overlies the expected location of the urinary bladder.  Image demonstrates the caudal aspect of bilateral double J ureteral stents.   There is minimal contrast opacification of the distal aspect of the bilateral  ureters.  The urinary bladder is underdistended but there is the suggestion of a persistent filling defect within the urinary bladder compatible with known bladder hematoma.  IMPRESSION: Post bilateral double-J ureteral stent placement.   Original Report Authenticated By: Jack Ruiz, MD   US Renal  04/19/2013   *RADIOLOGY REPORT*  Clinical Data: Recent transurethral resection of prostate. Hypertension.  Colon cancer.  Ureteral calcification.  RENAL/URINARY TRACT ULTRASOUND COMPLETE  Comparison:  CT abdomen and pelvis 04/16/2013  Findings:  Right Kidney:  Right kidney measures 13.7 cm in length.  There is diffuse parenchymal atrophy with prominent hydronephrosis. Hydronephrosis appears to be increasing since the previous CT scan.  Left Kidney:  Left kidney measures 13.5 cm length.  Cystic structure off of the upper pole appears to communicate with upper pole calix and may be a cyst or a calyceal diverticulum.  Moderate hydronephrosis of the left kidney.  This is similar to previous study.  The left ureteral stent is not identified and may have been removed in the interval.  There is a subcapsular fluid collection in the mid pole which appears smaller than on the previous study. This could be hematoma or urinoma.  No pararenal fluid collections.  Bladder:  Bladder volume measures 572 ml.  Foley catheter is in the bladder base and is open to drainage.  Recommend check Foley catheter function.  There is a large heterogeneous filling defect in the bladder measuring 7.8 x 8.2 x 7.2 cm.  This is of mixed but predominate hyperechoic appearance.  No flow is demonstrated on color flow Doppler imaging.  This likely represents a large blood clot.  No definite bladder wall thickening.  IMPRESSION: Large blood clot in the bladder with Foley catheter in place. Bilateral hydronephrosis with progressive hydronephrosis on the right since previous CT scan.  Left renal cyst or calyceal dilatation.  Small subcapsular collection on  the left may represent hematoma or urinoma.   Original Report Authenticated By: Jack [name], M.D.   Dg Chest Port 1 View  04/20/2013   *RADIOLOGY REPORT*  Clinical Data: Evaluate pulmonary edema  PORTABLE CHEST - 1 VIEW  Comparison: 04/19/2013; 04/18/2013  Findings:  Grossly unchanged enlarged cardiac silhouette and mediastinal contours given persistently reduced lung volumes.  Stable positioning of support apparatus.  No pneumothorax. Mild cephalization of flow without frank evidence of edema.  Trace bilateral effusions are not excluded. No change to slight worsening in bibasilar heterogeneous opacities, left greater than right. There is unchanged deformity involving the posterior lateral aspect of the right eighth and ninth ribs.  No acute osseous abnormalities.  IMPRESSION: 1.  Stable positioning of support apparatus.  No pneumothorax. 2.  Persistent findings of cardiomegaly, hypoventilation and pulmonary venous congestion without frank evidence of edema.  3.  No change to slight worsening of bibasilar opacities, left greater than right, likely atelectasis.   Original Report Authenticated By: Jack Ruiz, MD   Portable Chest Xray  04/19/2013   *RADIOLOGY REPORT*  Clinical Data: Endotracheal tube placement and central line placement.  PORTABLE CHEST - 1 VIEW  Comparison: Earlier today at 12:09 p.m.  Findings: Mildly degraded exam due to AP portable technique and patient body habitus.  Endotracheal tube 3.0 cm above  carina.  Nasogastric tube extends beyond the  inferior aspect of the film.  Right IJ central line difficult to visualize centrally.  Followed to at least the level of the mid right atrium.  No pneumothorax.  Remote right rib trauma. Cardiomegaly accentuated by AP portable technique.  Left costophrenic angle  excluded.  No definite pleural fluid. No congestive failure.  Mild right base volume loss.  IMPRESSION: Endotracheal tube appropriately positioned.  Right internal jugular line which is  difficult to visualize centrally.  Followed to at least the level of the mid right atrium. Recommend retraction approximately 5.5 cm with repeat radiograph.  Cardiomegaly and low lung volumes, without congestive failure   Original Report Authenticated By: Jeronimo Greaves, M.D.   Dg Chest Port 1 View  04/19/2013   *RADIOLOGY REPORT*  Clinical Data: Evaluate for pulmonary edema.  PORTABLE CHEST - 1 VIEW  Comparison: Chest x-ray 04/18/2013.  Findings: Lung volumes are normal.  No consolidative airspace disease.  No pleural effusions.  No pneumothorax.  No pulmonary nodule or mass noted.  Pulmonary vasculature and the cardiomediastinal silhouette are within normal limits.  IMPRESSION: 1. No radiographic evidence of acute cardiopulmonary disease. Specifically, no evidence of pulmonary edema at this time.   Original Report Authenticated By: Trudie Reed, M.D.   Dg Chest Port 1 View  04/18/2013   *RADIOLOGY REPORT*  Clinical Data: Evaluate for edema  PORTABLE CHEST - 1 VIEW  Comparison: 04/16/2013  Findings: Low lung volumes are present taking this into consideration heart size is mildly enlarged and stable.  The mediastinal contour appears unchanged with the right hilum appearing somewhat less prominent than on the prior exam. Mild central peribronchial cuffing and interstitial haziness is seen and this may indicate early or incipient pulmonary edema however the interstitium may be accentuated by low lung volumes as well.  No interstitial septal lines, pleural effusion or overt pulmonary edema is seen. No focal infiltrates are noted.  Old healed rib fracture of the right posterior eighth rib is stable.  IMPRESSION: Subtle changes may indicate early or developing interstitial edema but no overt interstitial or alveolar edema is seen.  No focal infiltrates.   Original Report Authenticated By: Rhodia Albright, M.D.    I have reviewed the patient's current medications.  Assessment/Plan: 1  AKI/CKD vol xs. Acid base  better with recovery of function and bicarb replacement.  Relief of obstruction should allow to correct depending on tissue injury from hematoma and compression from obstruction.  Need to limit vol as has vol xs. 2 anemia blood loss and renal. Check Fe 3 Renal hematoma, bladder hematoma from anticoag 4  Resp failure per CCM 5 Hypotension intravasc vol, ? infx 6 PE IVC filter P D/C ivf, check Fe , follow Cr, acid/base.   LOS: 2 days   Jack [name] L 04/20/2013,10:42 AM

## 2013-04-20 NOTE — Progress Notes (Signed)
CRITICAL VALUE ALERT  Critical value received:  Trop 0.31  Date of notification:  04/20/2013   Time of notification:  12:20PM  Critical value read back:yes  Nurse who received alert:  Jacqulyn Cane   MD notified (1st page):  Dr. Tyson Alias   Time of first page:  12:28PM  MD notified (2nd page):  Time of second page:  Responding MD:  Dr. Tyson Alias   Time MD responded:  12:35PM

## 2013-04-20 NOTE — Progress Notes (Signed)
[name] Progress Note    Assessment/ Plan:   1. AKI/?CKD: pt developed worsening Cr function, anuria, and bilateral hydronephrosis in setting of significant obstructing blood clot and was taken to OR on 04/19/13 for cystoscopy/bilateral J stent insertion/suprapubic tube placement. On 04/20/13 pt Cr improving 7.5>>6.8 and producing urine ~ 71ml/kg/hr this AM (but in setting of bladder irrigation therefore hard to estimate true urine output). Acidemia likely 2/2 ARF is improving on IV NaHCO3 this AM with most recent ABG showing mild mixed respiratory Alkalosis and Non-AG metabolic Acidosis.  -d/c IV NaHCO3 -repeat renal panel 1700 2. Anemia: Pt Hgb 6.7 this AM in setting of extensive blood clot and surgery received 1U PRBCs with post-transfusion Hgb 7.9.  -Fe studies pending. -repeat Hgb 1700   3. PE: pt developed post-op on 04/13/13 and has subsequent IVC filter 4. Renal stones: pt has extensive hx of stones unknown composition.   Subjective:   Pt is POD #1. Sedated and intubated on pressors.    Objective:   BP 128/45  Pulse 78  Temp(Src) 97.7 F (36.5 C) (Oral)  Resp 24  Ht 5\' 9"  (1.753 m)  Wt 282 lb 6.6 oz (128.1 kg)  BMI 41.69 kg/m2  SpO2 100%  Intake/Output Summary (Last 24 hours) at 04/20/13 0817 Last data filed at 04/20/13 4782  Gross per 24 hour  Intake 3747.63 ml  Output    150 ml  Net 3597.63 ml   Weight change:   Physical Exam: NFA:OZHY -2, sedated CVS:RRR Resp:some scattered wheezes and rhonchi in bases QMV:HQIO, obese, minimal to hypoactive BS NGE:[id number] catheter in place draining some urine and blood no frank clots, slight LE edema  Imaging: Ir Ivc Filter Plmt / S&i /img Guid/mod Sed  04/18/2013   *RADIOLOGY REPORT*  Indication: High probability for pulmonary embolism based on ventilation and perfusion nuclear medicine examination.  Hematuria and left renal hematoma.  The patient is not a candidate for anticoagulation at this time.   PROCEDURE(S): IVC FILTER PLACEMENT; IVC VENOGRAM; ULTRASOUND FOR VASCULAR ACCESS  Physician:  Rachelle Hora. Henn, MD  Medications: Versed 4 mg, Fentanyl 100 mcg. A radiology nurse monitored the patient for moderate sedation.  Moderate sedation time: 33 minutes  Fluoroscopy time:  2 minutes and 36 seconds  Contrast:Carbon dioxide  Procedure:Informed consent was obtained for an IVC venogram and filter placement.  Ultrasound demonstrated a patent right common femoral vein.  Ultrasound images were obtained for documentation. The right groin was prepped and draped in a sterile fashion. Maximal barrier sterile technique was utilized including caps, mask, sterile gowns, sterile gloves, sterile drape, hand hygiene and skin antiseptic.  The skin was anesthetized with 1% lidocaine. A 21 gauge needle was directed into the vein with ultrasound guidance and a micropuncture dilator set was placed.  A wire was advanced into the IVC.  The filter sheath was advanced over the wire into the IVC.  An IVC venogram was performed with carbon dioxide.  Fluoroscopic images were obtained for documentation. The location of the renal veins was confirmed by cannulating the renal veins with a Bentson wire.   A Denali filter was deployed below the lowest renal vein.  The vascular sheath was removed with manual compression.  Findings:IVC was patent.  Bilateral renal veins were identified. The filter was deployed below the lowest renal vein.  Impression:Successful placement of a retrievable IVC filter.   Original Report Authenticated By: Richarda Overlie, M.D.   US Renal  04/19/2013   *RADIOLOGY REPORT*  Clinical Data:  Recent transurethral resection of prostate. Hypertension.  Colon cancer.  Ureteral calcification.  RENAL/URINARY TRACT ULTRASOUND COMPLETE  Comparison:  CT abdomen and pelvis 04/16/2013  Findings:  Right Kidney:  Right kidney measures 13.7 cm in length.  There is diffuse parenchymal atrophy with prominent hydronephrosis. Hydronephrosis appears  to be increasing since the previous CT scan.  Left Kidney:  Left kidney measures 13.5 cm length.  Cystic structure off of the upper pole appears to communicate with upper pole calix and may be a cyst or a calyceal diverticulum.  Moderate hydronephrosis of the left kidney.  This is similar to previous study.  The left ureteral stent is not identified and may have been removed in the interval.  There is a subcapsular fluid collection in the mid pole which appears smaller than on the previous study. This could be hematoma or urinoma.  No pararenal fluid collections.  Bladder:  Bladder volume measures 572 ml.  Foley catheter is in the bladder base and is open to drainage.  Recommend check Foley catheter function.  There is a large heterogeneous filling defect in the bladder measuring 7.8 x 8.2 x 7.2 cm.  This is of mixed but predominate hyperechoic appearance.  No flow is demonstrated on color flow Doppler imaging.  This likely represents a large blood clot.  No definite bladder wall thickening.  IMPRESSION: Large blood clot in the bladder with Foley catheter in place. Bilateral hydronephrosis with progressive hydronephrosis on the right since previous CT scan.  Left renal cyst or calyceal dilatation.  Small subcapsular collection on the left may represent hematoma or urinoma.   Original Report Authenticated By: Burman Nieves, M.D.   Portable Chest Xray  04/19/2013   *RADIOLOGY REPORT*  Clinical Data: Endotracheal tube placement and central line placement.  PORTABLE CHEST - 1 VIEW  Comparison: Earlier today at 12:09 p.m.  Findings: Mildly degraded exam due to AP portable technique and patient body habitus.  Endotracheal tube 3.0 cm above carina.  Nasogastric tube extends beyond the  inferior aspect of the film.  Right IJ central line difficult to visualize centrally.  Followed to at least the level of the mid right atrium.  No pneumothorax.  Remote right rib trauma. Cardiomegaly accentuated by AP portable technique.   Left costophrenic angle  excluded.  No definite pleural fluid. No congestive failure.  Mild right base volume loss.  IMPRESSION: Endotracheal tube appropriately positioned.  Right internal jugular line which is difficult to visualize centrally.  Followed to at least the level of the mid right atrium. Recommend retraction approximately 5.5 cm with repeat radiograph.  Cardiomegaly and low lung volumes, without congestive failure   Original Report Authenticated By: Jeronimo Greaves, M.D.   Dg Chest Port 1 View  04/19/2013   *RADIOLOGY REPORT*  Clinical Data: Evaluate for pulmonary edema.  PORTABLE CHEST - 1 VIEW  Comparison: Chest x-ray 04/18/2013.  Findings: Lung volumes are normal.  No consolidative airspace disease.  No pleural effusions.  No pneumothorax.  No pulmonary nodule or mass noted.  Pulmonary vasculature and the cardiomediastinal silhouette are within normal limits.  IMPRESSION: 1. No radiographic evidence of acute cardiopulmonary disease. Specifically, no evidence of pulmonary edema at this time.   Original Report Authenticated By: Trudie Reed, M.D.   Dg Chest Port 1 View  04/18/2013   *RADIOLOGY REPORT*  Clinical Data: Evaluate for edema  PORTABLE CHEST - 1 VIEW  Comparison: 04/16/2013  Findings: Low lung volumes are present taking this into consideration heart size is mildly enlarged  and stable.  The mediastinal contour appears unchanged with the right hilum appearing somewhat less prominent than on the prior exam. Mild central peribronchial cuffing and interstitial haziness is seen and this may indicate early or incipient pulmonary edema however the interstitium may be accentuated by low lung volumes as well.  No interstitial septal lines, pleural effusion or overt pulmonary edema is seen. No focal infiltrates are noted.  Old healed rib fracture of the right posterior eighth rib is stable.  IMPRESSION: Subtle changes may indicate early or developing interstitial edema but no overt interstitial or  alveolar edema is seen.  No focal infiltrates.   Original Report Authenticated By: Rhodia Albright, M.D.    Labs: BMET  Recent Labs Lab 04/18/13 1245 04/19/13 0511 04/19/13 1136 04/19/13 1510 04/19/13 1715 04/19/13 1958 04/19/13 2026 04/19/13 2141 04/20/13 04/20/13 0529  NA 134* 132* 131* 131* 134* 135 133* 134* 133* 137  K 4.6 4.8 4.8 4.8 4.9 4.8 4.7 4.9 4.4 3.9  CL 97 96 95* 96 97 103 100 99 100 102  CO2 21 18* 15* 17* 16*  --  17* 18* 15* 20  GLUCOSE 127* 145* 134* 137* 131* 145* 155* 152* 142* 148*  BUN 71* 86* 94* 92* 94* 110* 92* 93* 89* 82*  CREATININE 6.28* 8.27* 9.59* 9.20* 9.79* 8.50* 8.19* 8.75* 7.50* 6.88*  CALCIUM 7.8* 7.8* 7.7* 7.3* 7.5*  --  6.6* 6.7* 6.8* 7.0*  PHOS 7.7* 9.4*  --   --  9.6*  --   --   --   --   --    CBC  Recent Labs Lab 04/18/13 1245  04/19/13 1115 04/19/13 1958 04/19/13 2026 04/20/13 04/20/13 0430  WBC 18.5*  < > 18.1*  --  16.8* 14.1* 8.7  NEUTROABS 15.3*  --   --   --   --   --   --   HGB 9.2*  < > 8.1* 7.1* 7.1* 6.7* 7.9*  HCT 27.1*  < > 23.1* 21.0* 20.8* 19.7* 22.5*  MCV 82.1  < > 80.8  --  82.2 81.4 82.1  PLT 188  < > 200  --  170 178 145*  < > = values in this interval not displayed.  Medications:    . albuterol  2.5 mg Nebulization Q6H  . antiseptic oral rinse  15 mL Mouth Rinse QID  . chlorhexidine  15 mL Mouth Rinse BID  . famotidine (PEPCID) IV  20 mg Intravenous Q24H  . ipratropium  0.5 mg Nebulization Q6H  . [START ON 04/21/2013] levofloxacin (LEVAQUIN) IV  500 mg Intravenous Q48H  . pantoprazole (PROTONIX) IV  40 mg Intravenous Daily  . piperacillin-tazobactam (ZOSYN)  IV  2.25 g Intravenous Q8H  . sodium chloride  3 mL Intravenous Q12H    Christen Bame, MD PGY-2 Internal Medicine Resident Pgr: [phone]  04/20/2013, 8:17 AM

## 2013-04-20 NOTE — Progress Notes (Signed)
eLink Physician-Brief Progress Note Patient Name: [name] DOB: 1946/01/16 MRN: [id number]  Date of Service  04/20/2013   HPI/Events of Note  Drop in hemoglobin from 7.1 to 6.7, drop in bicarb from 18 to 14   eICU Interventions  Transfuse 2U PRBC, give 1 amp bicarb now then start bicarb drip 182mEq/L at 125/hr   Intervention Category Major Interventions: Acid-Base disturbance - evaluation and management;Hemorrhage - evaluation and management  Moishe Schellenberg K. 04/20/2013, 12:45 AM

## 2013-04-20 NOTE — Progress Notes (Signed)
ANTIBIOTIC CONSULT NOTE - INITIAL  Pharmacy Consult:  Elita Quick Indication:  UTI  Allergies  Allergen Reactions  . Contrast Media [Iodinated Diagnostic Agents]     Blue dye    Patient Measurements: Height: 5\' 9"  (175.3 cm) Weight: 282 lb 6.6 oz (128.1 kg) IBW/kg (Calculated) : 70.7  Vital Signs: Temp: 97.7 F (36.5 C) (09/07 0727) Temp src: Oral (09/07 0727) BP: 107/92 mmHg (09/07 0800) Pulse Rate: 72 (09/07 0830) Intake/Output from previous day: 09/06 0701 - 09/07 0700 In: 4838.3 [I.V.:3288.3; Blood:600; IV Piggyback:950] Out: 150 [Blood:150] Intake/Output from this shift: Total I/O In: 6358.3 [I.V.:125; Other:5000; IV Piggyback:1233.3] Out: 9965 [Urine:9965]  Labs:  Recent Labs  04/19/13 2026  04/20/13 04/20/13 0430 04/20/13 0529 04/20/13 0736  WBC 16.8*  --  14.1* 8.7  --   --   HGB 7.1*  --  6.7* 7.9*  --   --   PLT 170  --  178 145*  --   --   CREATININE 8.19*  < > 7.50*  --  6.88* 6.24*  < > = values in this interval not displayed. Estimated Creatinine Clearance: 15.4 ml/min (by C-G formula based on Cr of 6.24). No results found for this basename: VANCOTROUGH, Leodis Binet, VANCORANDOM, GENTTROUGH, GENTPEAK, GENTRANDOM, TOBRATROUGH, TOBRAPEAK, TOBRARND, AMIKACINPEAK, AMIKACINTROU, AMIKACIN,  in the last 72 hours   Microbiology: Recent Results (from the past 720 hour(s))  MRSA PCR SCREENING     Status: None   Collection Time    04/18/13 11:07 AM      Result Value Range Status   MRSA by PCR NEGATIVE  NEGATIVE Final   Comment:            The GeneXpert MRSA Assay (FDA     approved for NASAL specimens     only), is one component of a     comprehensive MRSA colonization     surveillance program. It is not     intended to diagnose MRSA     infection nor to guide or     monitor treatment for     MRSA infections.    Medical History: Past Medical History  Diagnosis Date  . HTN (hypertension)   . Ureteral stone   . Colon cancer        Assessment: 28  YOM with urosepsis to continue vancomycin and change from Zosyn to [place].  Patient's renal function is improving - no plan for HD noted.  It appears that patient is starting to make urine based on documentation.  He remains afebrile and his WBC trended down to WNL.  Vanc 9/6 >> Elita Quick 9/6 >> 9/6, resumed 9/7 >> Zosyn 9/6 >> 9/7 Levo 9/6 >> 9/7  9/6 Urine - collected 9/6 blood cx - collected 9/5 MRSA PCR - negative   Goal of Therapy:  Vanc trough: 15 - 20 mcg/mL   Plan:  - Fortaz 1gm IV Q24H - No maintenance vanc order d/t renal dysfunction - Vanc random level in AM to guide further dosing (may need Q48H dosing) - Monitor renal fxn and clinical course to adjust abx dosing     Miron Marxen D. Laney Potash, PharmD, BCPS Pager:  [phone] 04/20/2013, 9:57 AM

## 2013-04-21 ENCOUNTER — Encounter (HOSPITAL_COMMUNITY): Payer: Self-pay | Admitting: General Practice

## 2013-04-21 ENCOUNTER — Inpatient Hospital Stay (HOSPITAL_COMMUNITY): Payer: PRIVATE HEALTH INSURANCE

## 2013-04-21 LAB — COMPREHENSIVE METABOLIC PANEL
ALT: 8 U/L (ref 0–53)
Alkaline Phosphatase: 47 U/L (ref 39–117)
CO2: 23 mEq/L (ref 19–32)
GFR calc Af Amer: 17 mL/min — ABNORMAL LOW (ref >90)
GFR calc non Af Amer: 15 mL/min — ABNORMAL LOW (ref >90)
Glucose, Bld: 123 mg/dL — ABNORMAL HIGH (ref 70–99)
Potassium: 4.2 mEq/L (ref 3.5–5.1)
Sodium: 141 mEq/L (ref 135–145)
Total Bilirubin: 0.3 mg/dL (ref 0.3–1.2)

## 2013-04-21 LAB — TYPE AND SCREEN: Unit division: 0

## 2013-04-21 LAB — CBC
MCH: 29.2 pg (ref 26.0–34.0)
MCV: 84.6 fL (ref 78.0–100.0)
Platelets: 177 10*3/uL (ref 150–400)
RDW: 15.7 % — ABNORMAL HIGH (ref 11.5–15.5)

## 2013-04-21 LAB — IRON AND TIBC: UIBC: 126 ug/dL (ref 125–400)

## 2013-04-21 MED ORDER — PNEUMOCOCCAL VAC POLYVALENT 25 MCG/0.5ML IJ INJ
0.5000 mL | INJECTION | Freq: Once | INTRAMUSCULAR | Status: AC
  Administered 2013-04-21: 0.5 mL via INTRAMUSCULAR
  Filled 2013-04-21 (×2): qty 0.5

## 2013-04-21 MED ORDER — VANCOMYCIN HCL IN DEXTROSE 1-5 GM/200ML-% IV SOLN
1000.0000 mg | Freq: Two times a day (BID) | INTRAVENOUS | Status: DC
  Administered 2013-04-21: 1000 mg via INTRAVENOUS
  Filled 2013-04-21 (×2): qty 200

## 2013-04-21 MED ORDER — DARBEPOETIN ALFA-POLYSORBATE 40 MCG/0.4ML IJ SOLN
40.0000 ug | INTRAMUSCULAR | Status: DC
  Filled 2013-04-21: qty 0.4

## 2013-04-21 MED ORDER — SODIUM CHLORIDE 0.9 % IV SOLN
1020.0000 mg | Freq: Once | INTRAVENOUS | Status: AC
  Administered 2013-04-21: 1020 mg via INTRAVENOUS
  Filled 2013-04-21: qty 34

## 2013-04-21 NOTE — Anesthesia Postprocedure Evaluation (Signed)
  Anesthesia Post-op Note  Patient: Jack [name]  Procedure(s) Performed: Procedure(s) with comments: CYSTOSCOPY WITH STENT PLACEMENT (Bilateral) CYSTOSCOPY WITH CLOT EVACUATION  (N/A) EXPLORATORY LAPAROTOMY (N/A) - Exploratory Laparotomy with evacuation of blood clot in bladder with placement of super pubic tube   Patient Location: Nursing Unit  Anesthesia Type:General  Level of Consciousness: awake, alert  and oriented  Airway and Oxygen Therapy: Patient Spontanous Breathing  Post-op Pain: none  Post-op Assessment: Post-op Vital signs reviewed  Post-op Vital Signs: stable  Complications: No apparent anesthesia complications

## 2013-04-21 NOTE — H&P (Deleted)
PULMONARY  / CRITICAL CARE MEDICINE  Name: Jack [name] MRN: [id number] DOB: June 05, 1946    ADMISSION DATE:  04/18/2013  REFERRING MD :  Joanette Gula PRIMARY SERVICE:  PCCM  CHIEF COMPLAINT:  Acute on chronic renal failure  BRIEF PATIENT DESCRIPTION:  33 M underwent TURP and L ureteral stent 8/29. Admitted to [facility] [place] 9/02 with acute PE and anticoagulation initiated. Hospitalization c/b hematuria, progressive rean linsufficiency and anemia requiring transfusion. Bladder irrigations initiated and CT scan abdomen performed 9/03 revealed L renal fluid subcapsular fluid collection thought to represent hematoma. Transferred to [facility] 9/05 for impending need for HD and placement of IVC filter. Currently has a suprapubic catheter with CBI.   SIGNIFICANT EVENTS / STUDIES:  CT head 9/02:  NAD V/Q scan 9/02: high prob. Multiple unmatched Q defects Echocardiogram9/02: LVEF 5- 55%, RV moderately dialted CT abd 9/03: persistent L pelvocaliectasis/ureterectasis. 7.7 cm L sided subcapsular fluid collection, possible hematoma. Atrophic R kidney. S/P R hemicolectomy  ------------------- Above studies performed at [facility] ------------------------ 9/05 Renal US: Large blood clot in the bladder with Foley catheter in place.  Bilateral hydronephrosis with progressive hydronephrosis on the  right since previous CT scan. Left renal cyst or calyceal  dilatation. Small subcapsular collection on the left may represent  hematoma or urinoma. 9/5 filter placed by IR 9/6- SOB, catheter clotted off 9/6- bilateral stents placed, increase output  LINES / TUBES: 9/6 ett>>> 9/8 9/6 rt ij >>> 9/6 left radial>>> 9/8.  CULTURES: 9/6 urine>>> 9/6 [facility] x2>>>  ANTIBIOTICS: 9/6 ceftaz>>> 9/6 vanc>>>  SUBJECTIVE:  Patient feeling well this am. No new complaints. Extubated this am. Off pressors.   VITAL SIGNS: Temp:  [97.6 F (36.4 C)-98.6 F (37 C)] 97.6 F (36.4 C) (09/08 0840) Pulse Rate:  [73-100] 88  (09/08 0700) Resp:  [0-29] 15 (09/08 0700) BP: (94-134)/(38-86) 116/41 mmHg (09/08 0700) SpO2:  [94 %-100 %] 100 % (09/08 0729) Arterial Line BP: (91-170)/(37-62) 142/59 mmHg (09/08 0700) Weight:  [280 lb 6.8 oz (127.2 kg)] 280 lb 6.8 oz (127.2 kg) (09/08 0500) Reviewed  HEMODYNAMICS: CVP:  [4 mmHg-11 mmHg] 4 mmHg VENTILATOR SETTINGS: Vent Mode:  [-] PSV;CPAP PEEP:  [5 cmH20] 5 cmH20 Pressure Support:  [5 cmH20] 5 cmH20 INTAKE / OUTPUT: Intake/Output     09/07 0701 - 09/08 0700 09/08 0701 - 09/09 0700   I.V. (mL/kg) 1374.4 (10.8)    Blood     Other 9450    IV Piggyback 1333.3    Total Intake(mL/kg) 12157.8 (95.6)    Urine (mL/kg/hr) 20240 (6.6)    Blood     Total Output 78295     Net -8082.3            PHYSICAL EXAMINATION: General: Alert and oriented. Speech is coherent.  Neuro: rass 0, not following commands HEENT:  WNL Cardiovascular: RRR s M Lungs: reduced Abdomen: obese, soft, NABS Ext: trace symmetric edema, pending dopplers  CBC Recent Labs     04/20/13  1550  04/20/13  2100  04/21/13  0336  WBC  11.4*  10.6*  7.8  HGB  8.1*  7.9*  7.6*  HCT  23.2*  22.7*  22.0*  PLT  177  175  177   Coag's Recent Labs     04/18/13  1426  04/19/13  1136  04/20/13  0529  APTT  39*   --   35  INR  1.47  1.39  1.37   BMET Recent Labs  04/20/13  1045  04/20/13  1550  04/21/13  0336  NA  138  138  141  K  4.0  4.4  4.2  CL  103  104  106  CO2  21  22  23   BUN  76*  72*  64*  CREATININE  5.59*  5.08*  3.86*  GLUCOSE  127*  147*  123*   Electrolytes Recent Labs     04/18/13  1245  04/19/13  0511  04/19/13  1136   04/19/13  1715   04/20/13  1045  04/20/13  1550  04/21/13  0336  CALCIUM  7.8*  7.8*  7.7*   < >  7.5*   < >  7.1*  7.3*  7.6*  MG  1.9  1.9  1.9   --    --    --    --    --    --   PHOS  7.7*  9.4*   --    --   9.6*   --    --   5.8*   --    < > = values in this interval not displayed.   Sepsis Markers No results found for this  basename: LACTICACIDVEN, PROCALCITON, O2SATVEN,  in the last 72 hours ABG Recent Labs     04/19/13  1945  04/19/13  2147  04/20/13  0755  PHART  7.173*  7.204*  7.389  PCO2ART  49.6*  42.4  36.3  PO2ART  87.9  98.8  136.0*   Liver Enzymes Recent Labs     04/19/13  2026  04/20/13  0529  04/20/13  1550  04/21/13  0336  AST  10  10   --   10  ALT  9  9   --   8  ALKPHOS  59  53   --   47  BILITOT  0.2*  0.3   --   0.3  ALBUMIN  2.2*  2.4*  2.3*  2.1*   Cardiac Enzymes Recent Labs     04/18/13  1245  04/20/13  1048  TROPONINI  <0.30  0.31*  PROBNP  1762.0*   --    Glucose Recent Labs     04/18/13  1119  04/18/13  1523  04/18/13  1922  04/18/13  2340  04/19/13  0709  GLUCAP  139*  121*  134*  143*  204*    Imaging Ipcxr- atx, ett wnl, line wnl  CXR: 9/8 > slightly improved pulmonary edema with bibasilar atelectasis versus infiltrates and likely small effusions.  ASSESSMENT / PLAN:  PULMONARY A: Acute hypoxic respiratory failure Acute PE 9/02 atx as abdo distention noted P:   Extubated today. Stable  Continue to monitor  CARDIOVASCULAR A: r/o septic shock vs hypotension from sedation P:  BP much improved and stable currently  Off pressors this am   RENAL A: Acute renal failure Suspect component of CKD Recurrent kidney stones Recent TURP and ureteral stent (8/29) Renal capsular hematoma Hematuria S/p stents bilateral High risk post obstructive diuresis P:   Renal follow up Bladder irrigation on going per urology Renal function improving Cr 5.08 >3.86 Trend with BMPs  GASTROINTESTINAL A:  Restarted PO intake P:   Renal diet  pepcid  HEMATOLOGIC A:  Acute blood loss anemia, PE P:  Recheck CBC q12h  Hgb 7.9 >7.6. If cont to drop, may need transfusion. Goal Hgb of above 7. Has filter in place Doppler legs  ensure no mobile clot - pending  INFECTIOUS A:  r/o obstructive source, likely P:   BCx and Ucx are pending. NGTD Cont with  Ceftaz, vanc   ENDOCRINE tsh wnl A:  No hx of DM P:   CBGs  NEUROLOGIC A:  Tremor, doubt asterixis, anxiety, vent dyschrony, pain Pain P:   fent upright  TODAY'S SUMMARY: extubated this am, monitor for post obstructive diuresis, continue bladder irriagtion, continue with eabx until when we get cultures back. Patient can transfer to step down if he remains stable today.

## 2013-04-21 NOTE — Progress Notes (Signed)
Notified admission nurse of pending admission history that needs to be done.

## 2013-04-21 NOTE — Progress Notes (Addendum)
PULMONARY  / CRITICAL CARE MEDICINE  Name: Jack [name] MRN: [id number] DOB: [birth year]    ADMISSION DATE:  04/18/2013  REFERRING MD :  Joanette Gula PRIMARY SERVICE:  PCCM  CHIEF COMPLAINT:  Acute on chronic renal failure  BRIEF PATIENT DESCRIPTION:  15 M underwent TURP and L ureteral stent 8/29. Admitted to [facility] 9/02 with acute PE and anticoagulation initiated. Hospitalization c/b hematuria, progressive rean linsufficiency and anemia requiring transfusion. Bladder irrigations initiated and CT scan abdomen performed 9/03 revealed L renal fluid subcapsular fluid collection thought to represent hematoma. Transferred to [facility] 9/05 for impending need for HD and placement of IVC filter. Currently has a suprapubic catheter with CBI.   SIGNIFICANT EVENTS / STUDIES:  CT head 9/02:  NAD V/Q scan 9/02: high prob. Multiple unmatched Q defects Echocardiogram9/02: LVEF 5- 55%, RV moderately dialted CT abd 9/03: persistent L pelvocaliectasis/ureterectasis. 7.7 cm L sided subcapsular fluid collection, possible hematoma. Atrophic R kidney. S/P R hemicolectomy  ------------------- Above studies performed at [facility] ------------------------ 9/05 Renal US: Large blood clot in the bladder with Foley catheter in place. Bilateral hydronephrosis with progressive hydronephrosis on the right since previous CT scan. Left renal cyst or calyceal dilatation. Small subcapsular collection on the left may represent hematoma or urinoma. 9/5 IVC filter placed by IR 9/6- SOB, Foley catheter clotted off 9/6- bilateral ureteral stents placed, increase output  LINES / TUBES: 9/6 ett>>> 9/8 9/6 rt ij >>> 9/8 9/6 left radial>>> 9/8.  CULTURES: 9/6 urine >>  9/6 [facility] x2 >>   ANTIBIOTICS: 9/6 ceftaz>>> 9/8 9/6 vanc>>>9/8  SUBJECTIVE:  Patient feeling well this am. No new complaints. Extubated this am. Off pressors.   VITAL SIGNS: Temp:  [97.6 F (36.4 C)-98.6 F (37 C)] 97.6 F (36.4 C) (09/08  0840) Pulse Rate:  [73-100] 82 (09/08 1000) Resp:  [0-29] 17 (09/08 1000) BP: (94-134)/(38-86) 129/44 mmHg (09/08 1000) SpO2:  [94 %-100 %] 100 % (09/08 1000) Arterial Line BP: (91-187)/(37-62) 187/55 mmHg (09/08 1000) Weight:  [280 lb 6.8 oz (127.2 kg)] 280 lb 6.8 oz (127.2 kg) (09/08 0500) Reviewed  HEMODYNAMICS: CVP:  [4 mmHg-6 mmHg] 4 mmHg VENTILATOR SETTINGS:   INTAKE / OUTPUT: Intake/Output     09/07 0701 - 09/08 0700 09/08 0701 - 09/09 0700   P.O.  100   I.V. (mL/kg) 1394.4 (11) 40 (0.3)   Blood     Other 9450    IV Piggyback 1333.3    Total Intake(mL/kg) 12177.8 (95.7) 140 (1.1)   Urine (mL/kg/hr) 20240 (6.6)    Blood     Total Output 95621     Net -8062.3 +140          PHYSICAL EXAMINATION: General: Alert and oriented. Speech is coherent.  Neuro: rass 0, not following commands HEENT:  WNL Cardiovascular: RRR s M Lungs: reduced Abdomen: obese, soft, NABS Ext: trace symmetric edema, pending dopplers  CBC Recent Labs     04/20/13  1550  04/20/13  2100  04/21/13  0336  WBC  11.4*  10.6*  7.8  HGB  8.1*  7.9*  7.6*  HCT  23.2*  22.7*  22.0*  PLT  177  175  177   Coag's Recent Labs     04/18/13  1426  04/19/13  1136  04/20/13  0529  APTT  39*   --   35  INR  1.47  1.39  1.37   BMET Recent Labs     04/20/13  1045  04/20/13  1550  04/21/13  0336  NA  138  138  141  K  4.0  4.4  4.2  CL  103  104  106  CO2  21  22  23   BUN  76*  72*  64*  CREATININE  5.59*  5.08*  3.86*  GLUCOSE  127*  147*  123*   Electrolytes Recent Labs     04/18/13  1245  04/19/13  0511  04/19/13  1136   04/19/13  1715   04/20/13  1045  04/20/13  1550  04/21/13  0336  CALCIUM  7.8*  7.8*  7.7*   < >  7.5*   < >  7.1*  7.3*  7.6*  MG  1.9  1.9  1.9   --    --    --    --    --    --   PHOS  7.7*  9.4*   --    --   9.6*   --    --   5.8*   --    < > = values in this interval not displayed.   Sepsis Markers No results found for this basename: LACTICACIDVEN,  PROCALCITON, O2SATVEN,  in the last 72 hours ABG Recent Labs     04/19/13  1945  04/19/13  2147  04/20/13  0755  PHART  7.173*  7.204*  7.389  PCO2ART  49.6*  42.4  36.3  PO2ART  87.9  98.8  136.0*   Liver Enzymes Recent Labs     04/19/13  2026  04/20/13  0529  04/20/13  1550  04/21/13  0336  AST  10  10   --   10  ALT  9  9   --   8  ALKPHOS  59  53   --   47  BILITOT  0.2*  0.3   --   0.3  ALBUMIN  2.2*  2.4*  2.3*  2.1*   Cardiac Enzymes Recent Labs     04/18/13  1245  04/20/13  1048  TROPONINI  <0.30  0.31*  PROBNP  1762.0*   --    Glucose Recent Labs     04/18/13  1119  04/18/13  1523  04/18/13  1922  04/18/13  2340  04/19/13  0709  GLUCAP  139*  121*  134*  143*  204*    CXR: 9/8 > slightly improved pulmonary edema with bibasilar atelectasis versus infiltrates and likely small effusions.  ASSESSMENT / PLAN:  PULMONARY A: Acute hypoxic respiratory failure Acute PE 9/02, S/P IVC filter 9/05 P:   Tolerating extubation  Supplemental O2 as needed  CARDIOVASCULAR A: Hypotension post op, resolved P:  monitor  RENAL A: Acute renal failure, improving Suspect component of CKD Recurrent kidney stones Recent TURP and ureteral stent (8/29) Renal capsular hematoma Hematuria S/p bilateral ureteral stents 9/06 P:   Monitor BMET intermittently Correct electrolytes as indicated   GASTROINTESTINAL A:  No issues P:   Renal diet  pepcid  HEMATOLOGIC A:  Acute blood loss anemia P:  Monitor CBC intermittently No anticoagulation in setting of urinary tract bleeding Transfuse for Hgb < 7.0 or acute bleeding with hypotension  INFECTIOUS A:  No clear infection P:   Micro and abx as above  ENDOCRINE A:  Mild hyperglycemia with no prior hx of DM P:   D/C CBGs  NEUROLOGIC A:  Tremor, anxiety, pain P:   Cont PRN fentanyl  TODAY'S  SUMMARY:   Jack Fischer, MD ; [facility] [phone].  After 5:30 PM or weekends, call  [phone]

## 2013-04-21 NOTE — Progress Notes (Signed)
Patient ID: Jack [name], male   DOB: 05/16/1946, 67 y.o.   MRN: [id number]  Mr. Plucinski is awake and alert today.   He has excellent UOP and his Cr is falling rapidly.   The urine is clear on minimal CBI.  His wound is intact without erythema.    Imp: Doing well post bilateral stenting and open clot evacuation.  Rec: D/C urethral foley and CBI.   He will need the SP tube for 7-10 days before a voiding trial.

## 2013-04-21 NOTE — Progress Notes (Signed)
ANTIBIOTIC CONSULT NOTE   Pharmacy Consult for Vancomycin Indication:  urosepsis  Allergies  Allergen Reactions  . Contrast Media [Iodinated Diagnostic Agents]     Blue dye   Patient Measurements: Height: 5\' 9"  (175.3 cm) Weight: 282 lb 6.6 oz (128.1 kg) IBW/kg (Calculated) : 70.7 Height : 72 in  IBW: 165 kg  Adjusted Body Weight: 97.8 kg Vital Signs: Temp: 97.8 F (36.6 C) (09/08 0341) Temp src: Oral (09/08 0341) BP: 113/44 mmHg (09/08 0400) Pulse Rate: 96 (09/08 0415) Intake/Output from previous day: 09/07 0701 - 09/08 0700 In: 10267.8 [I.V.:1334.4; IV Piggyback:1333.3] Out: 45409 [Urine:15790] Labs:  Recent Labs  04/20/13 1045 04/20/13 1550 04/20/13 2100 04/21/13 0336  WBC 9.4 11.4* 10.6*  --   HGB 8.2* 8.1* 7.9*  --   PLT 162 177 175  --   CREATININE 5.59* 5.08*  --  3.86*   Estimated Creatinine Clearance: 24.9 ml/min (by C-G formula based on Cr of 3.86).  Microbiology: Recent Results (from the past 720 hour(s))  MRSA PCR SCREENING     Status: None   Collection Time    04/18/13 11:07 AM      Result Value Range Status   MRSA by PCR NEGATIVE  NEGATIVE Final   Comment:            The GeneXpert MRSA Assay (FDA     approved for NASAL specimens     only), is one component of a     comprehensive MRSA colonization     surveillance program. It is not     intended to diagnose MRSA     infection nor to guide or     monitor treatment for     MRSA infections.    Assessment: 67 YO Male with possible urosepsis for Vancomycin Vancomycin level this morning subtherapeutic as SCr has improved  Goal of Therapy:  Vancomycin trough level 15-20 mcg/ml  Plan:  Vancomycin 1 g IV q12h F/U renal function  Geannie Risen, PharmD, BCPS 04/21/2013,5:53 AM

## 2013-04-21 NOTE — Progress Notes (Signed)
Subjective: Pt extubated, no new complaints. Reports that he is hungry, + flatus.   Objective: Vital signs in last 24 hours: Temp:  [97.6 F (36.4 C)-98.6 F (37 C)] 97.7 F (36.5 C) (09/08 0747) Pulse Rate:  [72-100] 88 (09/08 0700) Resp:  [0-29] 15 (09/08 0700) BP: (94-134)/(38-86) 116/41 mmHg (09/08 0700) SpO2:  [94 %-100 %] 100 % (09/08 0729) Arterial Line BP: (91-170)/(37-62) 142/59 mmHg (09/08 0700) FiO2 (%):  [30 %] 30 % (09/07 0845) Weight:  [280 lb 6.8 oz (127.2 kg)] 280 lb 6.8 oz (127.2 kg) (09/08 0500) Weight change:   Intake/Output from previous day: 09/07 0701 - 09/08 0700 In: 12157.8 [I.V.:1374.4; IV Piggyback:1333.3] Out: 59563 [Urine:20240] Intake/Output this shift:    Gen: NAD, alert, cooperative with exam HEENT: NCAT, EOMI, PERRL  CV: RRR, good S1/S2, 2/6 systolic murmur Resp: mostly clear, scattered exp wheeze, non-labored Abd: SNTND, BS present, no guarding or organomegaly, clean dry intact midline bandage Ext: 3+ Pitting edema to the knee, warm, 2+ DP pulses Neuro: Alert and oriented, No gross deficits  Lab Results:  Recent Labs  04/20/13 1550 04/20/13 2100  WBC 11.4* 10.6*  HGB 8.1* 7.9*  HCT 23.2* 22.7*  PLT 177 175   BMET:   Recent Labs  04/20/13 1550 04/21/13 0336  NA 138 141  K 4.4 4.2  CL 104 106  CO2 22 23  GLUCOSE 147* 123*  BUN 72* 64*  CREATININE 5.08* 3.86*  CALCIUM 7.3* 7.6*   No results found for this basename: PTH,  in the last 72 hours Iron Studies:   Recent Labs  04/20/13 1536  IRON 15*  TIBC 141*  FERRITIN 496*    Studies/Results: Dg Retrograde Pyelogram  04/20/2013   *RADIOLOGY REPORT*  Clinical Data: Ureteral obstruction, hydronephrosis with acute kidney failure  RETROGRADE PYELOGRAM  Comparison: CT abdomen pelvis - 04/16/2013  Fluoroscopy time:  37 seconds  Findings:  A single spot intraoperative image of the lower pelvis is provided for review.  A cystoscope overlies the expected location of the urinary  bladder.  Image demonstrates the caudal aspect of bilateral double J ureteral stents.   There is minimal contrast opacification of the distal aspect of the bilateral ureters.  The urinary bladder is underdistended but there is the suggestion of a persistent filling defect within the urinary bladder compatible with known bladder hematoma.  IMPRESSION: Post bilateral double-J ureteral stent placement.   Original Report Authenticated By: Tacey Ruiz, MD   Dg Chest Port 1 View  04/21/2013   *RADIOLOGY REPORT*  Clinical Data: Short of breath, assess edema  PORTABLE CHEST - 1 VIEW  Comparison: Prior chest x-ray 04/20/2013  Findings: The patient has been extubated and the nasogastric tube removed.  Right IJ central venous catheter in unchanged position with the tip at the superior cavoatrial junction.  Slight interval decrease in pulmonary edema.  Bibasilar atelectasis versus infiltrate and likely small bilateral effusions persist.  Stable cardiomegaly and mediastinal contours.  Atherosclerotic calcifications noted in the transverse aorta.  No pneumothorax. Healed right-sided rib fractures.  IMPRESSION:  1.  Interval extubation and removal of nasogastric tube. 2.  Stable to slightly improved pulmonary edema. 3.  Persistent bibasilar atelectasis versus infiltrates and likely small effusions.   Original Report Authenticated By: Malachy Moan, M.D.   Dg Chest Port 1 View  04/20/2013   *RADIOLOGY REPORT*  Clinical Data: Evaluate pulmonary edema  PORTABLE CHEST - 1 VIEW  Comparison: 04/19/2013; 04/18/2013  Findings:  Grossly unchanged enlarged cardiac silhouette  and mediastinal contours given persistently reduced lung volumes.  Stable positioning of support apparatus.  No pneumothorax. Mild cephalization of flow without frank evidence of edema.  Trace bilateral effusions are not excluded. No change to slight worsening in bibasilar heterogeneous opacities, left greater than right. There is unchanged deformity involving  the posterior lateral aspect of the right eighth and ninth ribs.  No acute osseous abnormalities.  IMPRESSION: 1.  Stable positioning of support apparatus.  No pneumothorax. 2.  Persistent findings of cardiomegaly, hypoventilation and pulmonary venous congestion without frank evidence of edema.  3.  No change to slight worsening of bibasilar opacities, left greater than right, likely atelectasis.   Original Report Authenticated By: Tacey Ruiz, MD   Portable Chest Xray  04/19/2013   *RADIOLOGY REPORT*  Clinical Data: Endotracheal tube placement and central line placement.  PORTABLE CHEST - 1 VIEW  Comparison: Earlier today at 12:09 p.m.  Findings: Mildly degraded exam due to AP portable technique and patient body habitus.  Endotracheal tube 3.0 cm above carina.  Nasogastric tube extends beyond the  inferior aspect of the film.  Right IJ central line difficult to visualize centrally.  Followed to at least the level of the mid right atrium.  No pneumothorax.  Remote right rib trauma. Cardiomegaly accentuated by AP portable technique.  Left costophrenic angle  excluded.  No definite pleural fluid. No congestive failure.  Mild right base volume loss.  IMPRESSION: Endotracheal tube appropriately positioned.  Right internal jugular line which is difficult to visualize centrally.  Followed to at least the level of the mid right atrium. Recommend retraction approximately 5.5 cm with repeat radiograph.  Cardiomegaly and low lung volumes, without congestive failure   Original Report Authenticated By: Jeronimo Greaves, M.D.   Dg Chest Port 1 View  04/19/2013   *RADIOLOGY REPORT*  Clinical Data: Evaluate for pulmonary edema.  PORTABLE CHEST - 1 VIEW  Comparison: Chest x-ray 04/18/2013.  Findings: Lung volumes are normal.  No consolidative airspace disease.  No pleural effusions.  No pneumothorax.  No pulmonary nodule or mass noted.  Pulmonary vasculature and the cardiomediastinal silhouette are within normal limits.   IMPRESSION: 1. No radiographic evidence of acute cardiopulmonary disease. Specifically, no evidence of pulmonary edema at this time.   Original Report Authenticated By: Trudie Reed, M.D.    I have reviewed the patient's current medications.  Assessment/Plan: 1.  AKI/CKD with volume overload - Cre trending down - Acid base better with recovery of function and bicarb replacement.  Relief of obstruction should allow to correct depending on tissue injury from hematoma and compression from obstruction.   - Continue limiting fluid/volume as he is still overloaded - follow creatinine and acid/base  2. anemia blood loss and renal. - iron deficient, consider replacing with ferraheme  3. Renal hematoma, bladder hematoma from anticoag - anticoagulation now held, IVC filter placed  4.  Resp failure per CCM  5. Hypotension intravasc vol, questionable infx - pressors weaned this am  6. PE IVC filter    LOS: 3 days   Jack [name] 04/21/2013,8:18 AM

## 2013-04-21 NOTE — Progress Notes (Signed)
I have personally seen and examined this patient and agree with the assessment/plan as outlined above by Ermalinda Memos MD (PGY2). Patient improving with evacuation of bladder hematoma and bilateral J-stents with urinary diversion from SP catheter. No acute HD needs and anticipate continued renal recovery. Appreciate urology input.  Xian Apostol K.,MD 04/21/2013 8:49 AM

## 2013-04-22 ENCOUNTER — Encounter (HOSPITAL_COMMUNITY): Payer: Self-pay | Admitting: Urology

## 2013-04-22 DIAGNOSIS — Z5189 Encounter for other specified aftercare: Secondary | ICD-10-CM

## 2013-04-22 LAB — BASIC METABOLIC PANEL
Calcium: 8.2 mg/dL — ABNORMAL LOW (ref 8.4–10.5)
Creatinine, Ser: 2.45 mg/dL — ABNORMAL HIGH (ref 0.50–1.35)
GFR calc Af Amer: 30 mL/min — ABNORMAL LOW (ref >90)
GFR calc non Af Amer: 26 mL/min — ABNORMAL LOW (ref >90)

## 2013-04-22 LAB — CBC
Platelets: 185 10*3/uL (ref 150–400)
RDW: 16.2 % — ABNORMAL HIGH (ref 11.5–15.5)
WBC: 7.1 10*3/uL (ref 4.0–10.5)

## 2013-04-22 MED ORDER — DARBEPOETIN ALFA-POLYSORBATE 60 MCG/0.3ML IJ SOLN
60.0000 ug | INTRAMUSCULAR | Status: DC
  Administered 2013-04-22: 18:00:00 60 ug via SUBCUTANEOUS
  Filled 2013-04-22: qty 0.3

## 2013-04-22 MED ORDER — FUROSEMIDE 10 MG/ML IJ SOLN
40.0000 mg | Freq: Every day | INTRAMUSCULAR | Status: DC
  Administered 2013-04-22 – 2013-04-24 (×3): 40 mg via INTRAVENOUS
  Filled 2013-04-22 (×3): qty 4

## 2013-04-22 NOTE — Progress Notes (Signed)
I have personally seen and examined this patient and agree with the assessment/plan as outlined above by Ermalinda Memos MD. (PGY2). Improving renal function after alleviation of obstruction- unknown as to what the extent of residual renal injury will be. No acute volume or electrolyte issues noted.  Kenia Teagarden K.,MD 04/22/2013 9:04 AM

## 2013-04-22 NOTE — Progress Notes (Signed)
Subjective: No new complaints, tolerating food well, good appetite.   Objective: Vital signs in last 24 hours: Temp:  [97.6 F (36.4 C)-98.6 F (37 C)] 98.4 F (36.9 C) (09/09 0749) Pulse Rate:  [65-97] 78 (09/09 0800) Resp:  [15-27] 16 (09/09 0800) BP: (102-153)/(41-65) 131/45 mmHg (09/09 0800) SpO2:  [93 %-100 %] 95 % (09/09 0800) Arterial Line BP: (158-187)/(47-59) 158/47 mmHg (09/08 1100) Weight change:   Intake/Output from previous day: 09/08 0701 - 09/09 0700 In: 1334 [P.O.:930; I.V.:220; IV Piggyback:184] Out: 2650 [Urine:2650] Intake/Output this shift: Total I/O In: -  Out: 400 [Urine:400]  Gen: NAD, alert, cooperative with exam HEENT: NCAT, EOMI, MMM CV: RRR, good S1/S2, 2/6 systolic murmur Resp: scattered exp wheeze, non-labored, good air movement Abd: SNTND, BS present, no guarding or organomegaly, clean dry intact midline bandage Ext: 2-3+ Pitting edema to the knee, warm, 2+ DP pulses Neuro: Alert and oriented, No gross deficits  Lab Results:  Recent Labs  04/21/13 0336 04/22/13 0405  WBC 7.8 7.1  HGB 7.6* 7.7*  HCT 22.0* 23.6*  PLT 177 185   BMET:   Recent Labs  04/21/13 0336 04/22/13 0405  NA 141 142  K 4.2 4.2  CL 106 108  CO2 23 27  GLUCOSE 123* 108*  BUN 64* 53*  CREATININE 3.86* 2.45*  CALCIUM 7.6* 8.2*   No results found for this basename: PTH,  in the last 72 hours Iron Studies:   Recent Labs  04/20/13 1536  IRON 15*  TIBC 141*  FERRITIN 496*    Studies/Results: Dg Chest Port 1 View  04/21/2013   *RADIOLOGY REPORT*  Clinical Data: Short of breath, assess edema  PORTABLE CHEST - 1 VIEW  Comparison: Prior chest x-ray 04/20/2013  Findings: The patient has been extubated and the nasogastric tube removed.  Right IJ central venous catheter in unchanged position with the tip at the superior cavoatrial junction.  Slight interval decrease in pulmonary edema.  Bibasilar atelectasis versus infiltrate and likely small bilateral effusions  persist.  Stable cardiomegaly and mediastinal contours.  Atherosclerotic calcifications noted in the transverse aorta.  No pneumothorax. Healed right-sided rib fractures.  IMPRESSION:  1.  Interval extubation and removal of nasogastric tube. 2.  Stable to slightly improved pulmonary edema. 3.  Persistent bibasilar atelectasis versus infiltrates and likely small effusions.   Original Report Authenticated By: Malachy Moan, M.D.    I have reviewed the patient's current medications.  Assessment/Plan: 1.  AKI/CKD with volume overload - Cre continues to trend down, relief of obstruction should allow him to correct back to his baseline, which is likely CKD - s/p obstructing clot in bladder and non-draining stent in L ureter now s/p placement of double J stents and supra-pubic catheter.  - UOP low at 0.3 ml/kg/hr - Acid base better with recovery of function.  - Continue limiting fluid/volume as he is still overloaded - follow creatinine and acid/base  2. anemia of acute blood loss and chronic renal disease - iron deficient, replaced with 1,020 mg feraheme - Starting Aranesp today  3. Renal hematoma, bladder hematoma from anticoag - anticoagulation now held, IVC filter placed  4.  Resp failure - per primary, now on RA  5. Hypotension/decreased intravasc vol, questionable infx - pressors weaned 04/21/2013  6. PE- IVC filter placed    LOS: 4 days   Kevin Fenton 04/22/2013,8:37 AM

## 2013-04-22 NOTE — Progress Notes (Signed)
Patient ID: Jack [name], male   DOB: 1946-01-03, 67 y.o.   MRN: [id number]  Mr. Arnold is doing well with further improvement in his renal function and no further evidence of bleeding.    The SP is draining well and he has no pain.     Exam:  Incision is intact.     Imp:   He is doing well post op placement of bilateral JJ stents and open clot evac with SP tube insertion.  Rec:  I spoke with him about f/u and he would prefer to have his postop care in [place] with Dr. Harvin Hazel who did his initial surgery.    He will need to have the SP for at least a week prior to a voiding trial and needs the stents for a couple of weeks before removal.

## 2013-04-22 NOTE — Progress Notes (Signed)
[facility] Progress Note [place] TEAM 1 - Stepdown/ICU TEAM   Jack [name] NFA:[id number] DOB: 05-12-1946 DOA: 04/18/2013 PCP: Simone Curia, MD  Brief narrative: 25 M with a suprapubic catheter, nephrolithiasis, gout, HTN and partial colectomy  underwent TURP and L ureteral stent for Nephrolithiasis per patient on 8/29-.   Admitted to [facility] 9/02 with a VQ that was high probability for acute PE and placed on Anticoagulation.  Subsequently during the admission he developed hematuria, progressive renal insufficiency and anemia requiring transfusion. Bladder irrigations initiated and CT scan abdomen performed 9/03 revealed L renal fluid subcapsular fluid collection thought to represent hematoma. Transferred to [facility] 9/05 for impending need for HD and placement of IVC filter.  IVC filter placed on 9/5- dopplers were negative for DVT He was intubated 9/6 - extubated on 9/8 9/6- renal ultrasound revealed Bilateral hydronephrosis with progressive hydronephrosis on the right since previous CT scan. Left renal cyst or calyceal dilatation. Small subcapsular collection on the left may represent hematoma or urinoma- foley cath clotted and pt was taken to OR for bilateral ureteric stents (previous left renal stent replaced) and OPEN evacuation of clot     Assessment/Plan: Principal Problem:   Acute renal failure/  Chronic renal insufficiency - due obstruction with clot subsequent to subcapsular hematoma from anticoagulation - no dialysis was needed - resolving s/p stenting and open evacuation of clot - renal following  Active Problems:   Acute respiratory failure with hypoxia/ PE on 9/2 - now extubated and doing well - avoid anticoagulation until OK with Urology - IVC filter placed    Hematuria/   Renal hematoma - per Urology  - s/p above mentioned extensive procedure - s/p CBI- urine now clear and draining into foley connected to suprapubic cath    Acute blood loss anemia - cont  to follow Hgb closely and transfuse if needed - currently Hgb 7/7 without signs of further bleeding     Code Status: full code Family Communication: with wife in room Disposition Plan: transferred from PCCM to telemetry  Consultants: Urology   Antibiotics: 9/6 ceftaz>>> 9/8  9/6 vanc>>>9/8   DVT prophylaxis: SCDs  HPI/Subjective: Pt alert and eating breakfast- has no complaints- got out of bed- excited to be improving and states he is impressed with the care he has received.    Objective: Blood pressure 143/70, pulse 80, temperature 98.4 F (36.9 C), temperature source Oral, resp. rate 21, height 5\' 9"  (1.753 m), weight 127.2 kg (280 lb 6.8 oz), SpO2 96.00%.  Intake/Output Summary (Last 24 hours) at 04/22/13 1849 Last data filed at 04/22/13 1825  Gross per 24 hour  Intake    150 ml  Output   3152 ml  Net  -3002 ml     Exam: General: No acute respiratory distress Lungs: Clear to auscultation bilaterally without wheezes or crackles Cardiovascular: Regular rate and rhythm without murmur gallop or rub normal S1 and S2 Abdomen: Nontender, nondistended, soft, bowel sounds positive, no rebound, no ascites, no appreciable mass- suprapubic catheter intact.  Extremities: No significant cyanosis, clubbing, or edema bilateral lower extremities  Data Reviewed: Basic Metabolic Panel:  Recent Labs Lab 04/18/13 1245 04/19/13 0511 04/19/13 1136  04/19/13 1715  04/20/13 0736 04/20/13 1045 04/20/13 1550 04/21/13 0336 04/22/13 0405  NA 134* 132* 131*  < > 134*  < > 137 138 138 141 142  K 4.6 4.8 4.8  < > 4.9  < > 3.8 4.0 4.4 4.2 4.2  CL 97 96 95*  < >  97  < > 102 103 104 106 108  CO2 21 18* 15*  < > 16*  < > 20 21 22 23 27   GLUCOSE 127* 145* 134*  < > 131*  < > 131* 127* 147* 123* 108*  BUN 71* 86* 94*  < > 94*  < > 77* 76* 72* 64* 53*  CREATININE 6.28* 8.27* 9.59*  < > 9.79*  < > 6.24* 5.59* 5.08* 3.86* 2.45*  CALCIUM 7.8* 7.8* 7.7*  < > 7.5*  < > 6.9* 7.1* 7.3* 7.6*  8.2*  MG 1.9 1.9 1.9  --   --   --   --   --   --   --   --   PHOS 7.7* 9.4*  --   --  9.6*  --   --   --  5.8*  --   --   < > = values in this interval not displayed. Liver Function Tests:  Recent Labs Lab 04/18/13 1245 04/19/13 1715 04/19/13 2026 04/20/13 0529 04/20/13 1550 04/21/13 0336  AST 15  --  10 10  --  10  ALT 16  --  9 9  --  8  ALKPHOS 65  --  59 53  --  47  BILITOT 0.2*  --  0.2* 0.3  --  0.3  PROT 5.7*  --  4.9* 5.1*  --  4.9*  ALBUMIN 2.6* 2.3* 2.2* 2.4* 2.3* 2.1*   No results found for this basename: LIPASE, AMYLASE,  in the last 168 hours No results found for this basename: AMMONIA,  in the last 168 hours CBC:  Recent Labs Lab 04/18/13 1245  04/20/13 1045 04/20/13 1550 04/20/13 2100 04/21/13 0336 04/22/13 0405  WBC 18.5*  < > 9.4 11.4* 10.6* 7.8 7.1  NEUTROABS 15.3*  --  7.7  --   --   --   --   HGB 9.2*  < > 8.2* 8.1* 7.9* 7.6* 7.7*  HCT 27.1*  < > 23.2* 23.2* 22.7* 22.0* 23.6*  MCV 82.1  < > 82.9 83.2 83.2 84.6 88.1  PLT 188  < > 162 177 175 177 185  < > = values in this interval not displayed. Cardiac Enzymes:  Recent Labs Lab 04/18/13 1245 04/20/13 1048  TROPONINI <0.30 0.31*   BNP (last 3 results)  Recent Labs  04/18/13 1245  PROBNP 1762.0*   CBG:  Recent Labs Lab 04/18/13 1119 04/18/13 1523 04/18/13 1922 04/18/13 2340 04/19/13 0709  GLUCAP 139* 121* 134* 143* 204*    Recent Results (from the past 240 hour(s))  MRSA PCR SCREENING     Status: None   Collection Time    04/18/13 11:07 AM      Result Value Range Status   MRSA by PCR NEGATIVE  NEGATIVE Final   Comment:            The GeneXpert MRSA Assay (FDA     approved for NASAL specimens     only), is one component of a     comprehensive MRSA colonization     surveillance program. It is not     intended to diagnose MRSA     infection nor to guide or     monitor treatment for     MRSA infections.  CULTURE, BLOOD (ROUTINE X 2)     Status: None   Collection Time     04/19/13 10:02 PM      Result Value Range Status   Specimen Description BLOOD  A-LINE   Final   Special Requests BOTTLES DRAWN AEROBIC AND ANAEROBIC 5CC EA   Final   Culture  Setup Time     Final   Value: 04/20/2013 06:20     Performed at [facility]   Culture     Final   Value:        BLOOD CULTURE RECEIVED NO GROWTH TO DATE CULTURE WILL BE HELD FOR 5 DAYS BEFORE ISSUING A FINAL NEGATIVE REPORT     Performed at [facility]   Report Status PENDING   Incomplete  CULTURE, BLOOD (ROUTINE X 2)     Status: None   Collection Time    04/19/13 11:40 PM      Result Value Range Status   Specimen Description BLOOD RIGHT HAND   Final   Special Requests BOTTLES DRAWN AEROBIC ONLY 5CC   Final   Culture  Setup Time     Final   Value: 04/20/2013 06:21     Performed at [facility]   Culture     Final   Value:        BLOOD CULTURE RECEIVED NO GROWTH TO DATE CULTURE WILL BE HELD FOR 5 DAYS BEFORE ISSUING A FINAL NEGATIVE REPORT     Performed at [facility]   Report Status PENDING   Incomplete     Studies:  Recent x-ray studies have been reviewed in detail by the Attending Physician  Scheduled Meds:  Scheduled Meds: . darbepoetin (ARANESP) injection - NON-DIALYSIS  60 mcg Subcutaneous Q Tue-1800  . furosemide  40 mg Intravenous Daily  . sodium chloride  3 mL Intravenous Q12H   Continuous Infusions:   Time spent on care of this patient: 35 min   Calvert Cantor, MD  [facility] Office  [phone] Pager - Text Page per Loretha Stapler as per below:  On-Call/Text Page:      [email or web address]      password [email or web address]  If 7PM-7AM, please contact night-coverage [email or web address] Password [email or web address] 04/22/2013, 6:49 PM   LOS: 4 days

## 2013-04-23 LAB — BASIC METABOLIC PANEL
Chloride: 103 mEq/L (ref 96–112)
Creatinine, Ser: 1.85 mg/dL — ABNORMAL HIGH (ref 0.50–1.35)
GFR calc Af Amer: 42 mL/min — ABNORMAL LOW (ref >90)
Potassium: 3.8 mEq/L (ref 3.5–5.1)
Sodium: 138 mEq/L (ref 135–145)

## 2013-04-23 NOTE — Progress Notes (Signed)
I have personally seen and examined this patient and agree with the assessment/plan as outlined above by Ermalinda Memos MD (PGY2). Check BMET today and monitor trend of renal function. Plans for DC soon with OP urology follow up in [place]. Will decide on need and timing of renal follow up based on subsequent labs. Shivam Mestas K.,MD 04/23/2013 9:31 AM

## 2013-04-23 NOTE — Evaluation (Signed)
Physical Therapy Evaluation Patient Details Name: Jack [name] MRN: [id number] DOB: 09-17-1945 Today's Date: 04/23/2013 Time: 4098-1191 PT Time Calculation (min): 17 min  PT Assessment / Plan / Recommendation History of Present Illness  68 M underwent TURP and L ureteral stent 8/29. Admitted to [facility] 9/02 with acute PE and anticoagulation initiated. Hospitalization c/b hematuria, progressive rean linsufficiency and anemia requiring transfusion. Bladder irrigations initiated and CT scan abdomen performed 9/03 revealed L renal fluid subcapsular fluid collection thought to represent hematoma. Transferred to [facility] 9/05 for impending need for HD and placement of IVC filter. Currently has a suprapubic catheter with CBI.    Clinical Impression  Pt admitted with above. Pt currently with functional limitations due to the deficits listed below (see PT Problem List).  Pt will benefit from skilled PT to increase their independence and safety with mobility to allow discharge to the venue listed below.  Pt reports spouse will be home 24/7 to assist and pt agreeable to HHPT, BSC, and RW upon d/c.  Pt reports occasional L LE buckling which has almost resulted in falls at home so pt very agreeable to HHPT for strengthening and use of RW for safety.     PT Assessment  Patient needs continued PT services    Follow Up Recommendations  Home health PT    Does the patient have the potential to tolerate intense rehabilitation      Barriers to Discharge        Equipment Recommendations  Rolling walker with 5" wheels;3in1 (PT)    Recommendations for Other Services     Frequency Min 3X/week    Precautions / Restrictions Precautions Precautions: Fall   Pertinent Vitals/Pain n/a      Mobility  Bed Mobility Bed Mobility: Not assessed Details for Bed Mobility Assistance: pt sitting in recliner on arrival Transfers Transfers: Sit to Stand;Stand to Sit Sit to Stand: 4: Min guard;With upper extremity  assist;From chair/3-in-1 Stand to Sit: 4: Min guard;With upper extremity assist;To chair/3-in-1 Details for Transfer Assistance: min/guard for safety as pt reports knee buckling episode prior to admission and has not ambulated in hallway yet, verbal cues for safe technique Ambulation/Gait Ambulation/Gait Assistance: 4: Min guard Ambulation Distance (Feet): 300 Feet Assistive device: Rolling walker Ambulation/Gait Assistance Details: pt preferred use of RW for safety due to fear of L LE buckling, educated on safe use of RW including posture and RW distance Gait Pattern: Step-through pattern;Decreased stride length General Gait Details: pt reports no SOB however labored breathing observed upon sitting in recliner after ambulation, his breathing is "greatly improved" since admission    Exercises     PT Diagnosis: Difficulty walking  PT Problem List: Decreased strength;Decreased mobility;Decreased knowledge of use of DME PT Treatment Interventions: DME instruction;Gait training;Stair training;Functional mobility training;Therapeutic activities;Therapeutic exercise;Patient/family education     PT Goals(Current goals can be found in the care plan section) Acute Rehab PT Goals PT Goal Formulation: With patient Time For Goal Achievement: 05/07/13 Potential to Achieve Goals: Good  Visit Information  Last PT Received On: 04/23/13 Assistance Needed: +1 History of Present Illness: [name] underwent TURP and L ureteral stent 8/29. Admitted to [facility] 9/02 with acute PE and anticoagulation initiated. Hospitalization c/b hematuria, progressive rean linsufficiency and anemia requiring transfusion. Bladder irrigations initiated and CT scan abdomen performed 9/03 revealed L renal fluid subcapsular fluid collection thought to represent hematoma. Transferred to [facility] 9/05 for impending need for HD and placement of IVC filter. Currently has a suprapubic catheter with  CBI.         Prior Functioning  Home  Living Family/patient expects to be discharged to:: Private residence Living Arrangements: Spouse/significant other Available Help at Discharge: Family;Available 24 hours/day Type of Home: Mobile home Home Access: Ramped entrance Home Layout: Two level Alternate Level Stairs-Number of Steps: 1 step to bedroom Alternate Level Stairs-Rails: None Home Equipment: Other (comment) ("walking sticks") Prior Function Level of Independence: Independent Communication Communication: No difficulties    Cognition  Cognition Arousal/Alertness: Awake/alert Behavior During Therapy: WFL for tasks assessed/performed Overall Cognitive Status: Within Functional Limits for tasks assessed    Extremity/Trunk Assessment Lower Extremity Assessment Lower Extremity Assessment: LLE deficits/detail LLE Deficits / Details: knee flexion and extension 4-/5, pt reports decreased strength and L knee occasionally gives out during ambulation   Balance    End of Session PT - End of Session Equipment Utilized During Treatment: Gait belt Activity Tolerance: Patient tolerated treatment well Patient left: in chair;with call bell/phone within reach  GP     Jack [name],Jack [name] 04/23/2013, 1:52 PM Jack [name], PT, DPT 04/23/2013 Pager: [phone]

## 2013-04-23 NOTE — Progress Notes (Signed)
[facility] Progress Note [place] TEAM 1 - Stepdown/ICU TEAM   Jack [name] ZOX:[id number] DOB: 1946-05-24 DOA: 04/18/2013 PCP: Simone Curia, MD     Assessment/Plan:    Acute On Chronic Renal Failure - due obstruction with clot subsequent to subcapsular hematoma from anticoagulation - Nephrology consulted as patient did not require dialysis - Now resolving, s/p stenting and open evacuation of clot     Acute respiratory failure with hypoxia - secondary to PE on 9/2 -Requiring vent support initially, now extubated and doing well   Pulmonary Embolism - Patient with renal hematoma, s/p IVC filter placement    Hematuria/   Renal hematoma - Urology following - s/p CBI- urine now clear and draining into foley connected to suprapubic cath -Off of anticoagulation    Acute blood loss anemia - cont to follow Hgb closely and transfuse if needed, arnesp started.       Code Status: full code Family Communication: with wife in room Disposition Plan: transferred from PCCM to telemetry  Consultants: Urology   Antibiotics: 9/6 ceftaz>>> 9/8  9/6 vanc>>>9/8   DVT prophylaxis: SCDs  HPI/Subjective: Pt alert and eating breakfast- has no complaints- got out of bed- excited to be improving and states he is impressed with the care he has received.    Objective: Blood pressure 145/52, pulse 74, temperature 98.3 F (36.8 C), temperature source Oral, resp. rate 18, height 5\' 9"  (1.753 m), weight 127.2 kg (280 lb 6.8 oz), SpO2 95.00%.  Intake/Output Summary (Last 24 hours) at 04/23/13 1230 Last data filed at 04/23/13 0900  Gross per 24 hour  Intake    240 ml  Output   3801 ml  Net  -3561 ml     Exam: General: No acute respiratory distress Lungs: Clear to auscultation bilaterally without wheezes or crackles Cardiovascular: Regular rate and rhythm without murmur gallop or rub normal S1 and S2 Abdomen: Nontender, nondistended, soft, bowel sounds positive, no rebound, no  ascites, no appreciable mass- suprapubic catheter intact.  Extremities: No significant cyanosis, clubbing, or edema bilateral lower extremities  Data Reviewed: Basic Metabolic Panel:  Recent Labs Lab 04/18/13 1245 04/19/13 0511 04/19/13 1136  04/19/13 1715  04/20/13 1045 04/20/13 1550 04/21/13 0336 04/22/13 0405 04/23/13 1005  NA 134* 132* 131*  < > 134*  < > 138 138 141 142 138  K 4.6 4.8 4.8  < > 4.9  < > 4.0 4.4 4.2 4.2 3.8  CL 97 96 95*  < > 97  < > 103 104 106 108 103  CO2 21 18* 15*  < > 16*  < > 21 22 23 27 26   GLUCOSE 127* 145* 134*  < > 131*  < > 127* 147* 123* 108* 155*  BUN 71* 86* 94*  < > 94*  < > 76* 72* 64* 53* 43*  CREATININE 6.28* 8.27* 9.59*  < > 9.79*  < > 5.59* 5.08* 3.86* 2.45* 1.85*  CALCIUM 7.8* 7.8* 7.7*  < > 7.5*  < > 7.1* 7.3* 7.6* 8.2* 8.7  MG 1.9 1.9 1.9  --   --   --   --   --   --   --   --   PHOS 7.7* 9.4*  --   --  9.6*  --   --  5.8*  --   --   --   < > = values in this interval not displayed. Liver Function Tests:  Recent Labs Lab 04/18/13 1245 04/19/13 1715 04/19/13 2026 04/20/13  1610 04/20/13 1550 04/21/13 0336  AST 15  --  10 10  --  10  ALT 16  --  9 9  --  8  ALKPHOS 65  --  59 53  --  47  BILITOT 0.2*  --  0.2* 0.3  --  0.3  PROT 5.7*  --  4.9* 5.1*  --  4.9*  ALBUMIN 2.6* 2.3* 2.2* 2.4* 2.3* 2.1*   No results found for this basename: LIPASE, AMYLASE,  in the last 168 hours No results found for this basename: AMMONIA,  in the last 168 hours CBC:  Recent Labs Lab 04/18/13 1245  04/20/13 1045 04/20/13 1550 04/20/13 2100 04/21/13 0336 04/22/13 0405  WBC 18.5*  < > 9.4 11.4* 10.6* 7.8 7.1  NEUTROABS 15.3*  --  7.7  --   --   --   --   HGB 9.2*  < > 8.2* 8.1* 7.9* 7.6* 7.7*  HCT 27.1*  < > 23.2* 23.2* 22.7* 22.0* 23.6*  MCV 82.1  < > 82.9 83.2 83.2 84.6 88.1  PLT 188  < > 162 177 175 177 185  < > = values in this interval not displayed. Cardiac Enzymes:  Recent Labs Lab 04/18/13 1245 04/20/13 1048  TROPONINI  <0.30 0.31*   BNP (last 3 results)  Recent Labs  04/18/13 1245  PROBNP 1762.0*   CBG:  Recent Labs Lab 04/18/13 1119 04/18/13 1523 04/18/13 1922 04/18/13 2340 04/19/13 0709  GLUCAP 139* 121* 134* 143* 204*    Recent Results (from the past 240 hour(s))  MRSA PCR SCREENING     Status: None   Collection Time    04/18/13 11:07 AM      Result Value Range Status   MRSA by PCR NEGATIVE  NEGATIVE Final   Comment:            The GeneXpert MRSA Assay (FDA     approved for NASAL specimens     only), is one component of a     comprehensive MRSA colonization     surveillance program. It is not     intended to diagnose MRSA     infection nor to guide or     monitor treatment for     MRSA infections.  CULTURE, BLOOD (ROUTINE X 2)     Status: None   Collection Time    04/19/13 10:02 PM      Result Value Range Status   Specimen Description BLOOD A-LINE   Final   Special Requests BOTTLES DRAWN AEROBIC AND ANAEROBIC 5CC EA   Final   Culture  Setup Time     Final   Value: 04/20/2013 06:20     Performed at [facility]   Culture     Final   Value:        BLOOD CULTURE RECEIVED NO GROWTH TO DATE CULTURE WILL BE HELD FOR 5 DAYS BEFORE ISSUING A FINAL NEGATIVE REPORT     Performed at [facility]   Report Status PENDING   Incomplete  CULTURE, BLOOD (ROUTINE X 2)     Status: None   Collection Time    04/19/13 11:40 PM      Result Value Range Status   Specimen Description BLOOD RIGHT HAND   Final   Special Requests BOTTLES DRAWN AEROBIC ONLY 5CC   Final   Culture  Setup Time     Final   Value: 04/20/2013 06:21     Performed at [facility]   Culture     Final   Value:        BLOOD CULTURE RECEIVED NO GROWTH TO DATE CULTURE WILL BE HELD FOR 5 DAYS BEFORE ISSUING A FINAL NEGATIVE REPORT     Performed at [facility]   Report Status PENDING   Incomplete     Studies:  Recent x-ray studies have been reviewed in detail by the Attending  Physician  Scheduled Meds:  Scheduled Meds: . darbepoetin (ARANESP) injection - NON-DIALYSIS  60 mcg Subcutaneous Q Tue-1800  . furosemide  40 mg Intravenous Daily  . sodium chloride  3 mL Intravenous Q12H   Continuous Infusions:   Time spent on care of this patient: 35 min   Jeralyn Bennett, MD  [facility] Office  [phone] Pager - Text Page per Loretha Stapler as per below:  On-Call/Text Page:      [email or web address]      password [email or web address]  If 7PM-7AM, please contact night-coverage [email or web address] Password [email or web address] 04/23/2013, 12:30 PM   LOS: 5 days

## 2013-04-23 NOTE — Progress Notes (Signed)
Subjective: No new complaints, tolerating food well, + BM, wanting to go home.   Objective: Vital signs in last 24 hours: Temp:  [97.9 F (36.6 C)-99.1 F (37.3 C)] 98.8 F (37.1 C) (09/10 0500) Pulse Rate:  [73-93] 75 (09/10 0500) Resp:  [14-22] 20 (09/10 0500) BP: (114-143)/(48-70) 123/61 mmHg (09/10 0500) SpO2:  [94 %-100 %] 94 % (09/10 0500) Weight change:   Intake/Output from previous day: 09/09 0701 - 09/10 0700 In: 0  Out: 4602 [Urine:4600; Stool:2] Intake/Output this shift:    Gen: NAD, alert, cooperative with exam HEENT: NCAT, MMM CV: RRR, good S1/S2, 2/6 systolic murmur Resp: scattered exp wheeze, non-labored, good air movement Abd: SNTND, BS present, no guarding or organomegaly, clean dry intact midline bandage Ext: 1-2+ Pitting edema to the knee, warm, 2+ DP pulses Neuro: Alert and oriented, No gross deficits  Lab Results:  Recent Labs  04/21/13 0336 04/22/13 0405  WBC 7.8 7.1  HGB 7.6* 7.7*  HCT 22.0* 23.6*  PLT 177 185   BMET:   Recent Labs  04/21/13 0336 04/22/13 0405  NA 141 142  K 4.2 4.2  CL 106 108  CO2 23 27  GLUCOSE 123* 108*  BUN 64* 53*  CREATININE 3.86* 2.45*  CALCIUM 7.6* 8.2*   No results found for this basename: PTH,  in the last 72 hours Iron Studies:   Recent Labs  04/20/13 1536  IRON 15*  TIBC 141*  FERRITIN 496*    Studies/Results: No results found.  I have reviewed the patient's current medications.  Assessment/Plan: 1.  AKI/CKD with volume overload - Cre continues to trend down, relief of obstruction should allow him to correct back to his baseline, which is likely CKD - s/p obstructing clot in bladder and non-draining stent in L ureter now s/p placement of double J stents and supra-pubic catheter.  - UOP low at 1.5 ml/kg/hr - Acid base better with recovery of function.  - Continue limiting fluid/volume as he is still overloaded - follow creatinine and acid/base  2. anemia of acute blood loss and chronic  renal disease - iron deficient, replaced with 1,020 mg feraheme - Aranesp started  3. Renal hematoma, bladder hematoma from anticoag - anticoagulation now held, IVC filter placed - Follow up iimaging per urology  4.  Resp failure - per primary, now on RA  5. Hypotension/decreased intravasc vol, questionable infx - pressors weaned 04/21/2013  6. PE- IVC filter placed    LOS: 5 days   Kevin Fenton 04/23/2013,8:33 AM

## 2013-04-23 NOTE — Progress Notes (Signed)
Patient ID: SKIP LITKE, male   DOB: Oct 28, 1945, 67 y.o.   MRN: [id number] 4 Days Post-Op  Subjective: Mr. Holloran continues to do well.   His Cr continues to fall and his UOP is excellent.  The urine is clear.  He reports no pain.  His hgb is stable.  ROS: Negative except as above  Objective: Vital signs in last 24 hours: Temp:  [97.9 F (36.6 C)-99.1 F (37.3 C)] 98.8 F (37.1 C) (09/10 0500) Pulse Rate:  [65-93] 75 (09/10 0500) Resp:  [14-22] 20 (09/10 0500) BP: (114-143)/(45-70) 123/61 mmHg (09/10 0500) SpO2:  [94 %-100 %] 94 % (09/10 0500)  Intake/Output from previous day: 09/09 0701 - 09/10 0700 In: 0  Out: 3602 [Urine:3600; Stool:2] Intake/Output this shift: Total I/O In: -  Out: 950 [Urine:950]  General appearance: alert and no distress wound is intact without erythema.  Lab Results:   Recent Labs  04/21/13 0336 04/22/13 0405  WBC 7.8 7.1  HGB 7.6* 7.7*  HCT 22.0* 23.6*  PLT 177 185   BMET  Recent Labs  04/21/13 0336 04/22/13 0405  NA 141 142  K 4.2 4.2  CL 106 108  CO2 23 27  GLUCOSE 123* 108*  BUN 64* 53*  CREATININE 3.86* 2.45*  CALCIUM 7.6* 8.2*   PT/INR No results found for this basename: LABPROT, INR,  in the last 72 hours ABG  Recent Labs  04/20/13 0755  PHART 7.389  HCO3 22.0    Studies/Results: No results found.  Anti-infectives: Anti-infectives   Start     Dose/Rate Route Frequency Ordered Stop   04/21/13 2000  levofloxacin (LEVAQUIN) IVPB 500 mg  Status:  Discontinued     500 mg 100 mL/hr over 60 Minutes Intravenous Every 48 hours 04/19/13 2004 04/20/13 0926   04/21/13 0600  vancomycin (VANCOCIN) IVPB 1000 mg/200 mL premix  Status:  Discontinued     1,000 mg 200 mL/hr over 60 Minutes Intravenous Every 12 hours 04/21/13 0557 04/21/13 1111   04/20/13 1100  cefTAZidime (FORTAZ) 1 g in dextrose 5 % 50 mL IVPB  Status:  Discontinued     1 g 100 mL/hr over 30 Minutes Intravenous Every 24 hours 04/20/13 1001 04/21/13 1111   04/19/13 2015  piperacillin-tazobactam (ZOSYN) IVPB 2.25 g  Status:  Discontinued     2.25 g 100 mL/hr over 30 Minutes Intravenous 3 times per day 04/19/13 2004 04/20/13 0943   04/19/13 2015  levofloxacin (LEVAQUIN) IVPB 750 mg     750 mg 100 mL/hr over 90 Minutes Intravenous  Once 04/19/13 2004 04/19/13 2200   04/19/13 1145  cefTAZidime (FORTAZ) 2 g in dextrose 5 % 50 mL IVPB     2 g 100 mL/hr over 30 Minutes Intravenous STAT 04/19/13 1145 04/19/13 1305   04/19/13 1145  vancomycin (VANCOCIN) 2,000 mg in sodium chloride 0.9 % 500 mL IVPB     2,000 mg 250 mL/hr over 120 Minutes Intravenous STAT 04/19/13 1145 04/19/13 1533      Current Facility-Administered Medications  Medication Dose Route Frequency Provider Last Rate Last Dose  . 0.9 %  sodium chloride infusion  250 mL Intravenous PRN Nelda Bucks, MD   250 mL at 04/20/13 0700  . 0.9 %  sodium chloride infusion  250 mL Intravenous PRN Nelda Bucks, MD   500 mL at 04/21/13 1000  . albuterol (PROVENTIL) (5 MG/ML) 0.5% nebulizer solution 2.5 mg  2.5 mg Nebulization Q3H PRN Merwyn Katos, MD      .  darbepoetin (ARANESP) injection 60 mcg  60 mcg Subcutaneous Q Tue-1800 Elenora Gamma, MD   60 mcg at 04/22/13 1732  . furosemide (LASIX) injection 40 mg  40 mg Intravenous Daily Leona Singleton, MD   40 mg at 04/22/13 1100  . sodium chloride 0.9 % injection 3 mL  3 mL Intravenous Q12H Merwyn Katos, MD   3 mL at 04/23/13 0110  . sodium chloride 0.9 % injection 3 mL  3 mL Intravenous PRN Merwyn Katos, MD        Assessment: s/p Procedure(s): CYSTOSCOPY WITH STENT PLACEMENT CYSTOSCOPY WITH CLOT EVACUATION  EXPLORATORY LAPAROTOMY  He continues to improve.  Plan:  As noted previously, he would like to follow up with Dr. Harvin Hazel in [place] and will need his staples removed early next week and the stents in a couple of weeks.    He should have reimaging of the kidneys to assess the hematoma in the next few weeks, but  that could be done in [place] as well.    I am going to sign off now.   Please reconsult as needed or if the patient is not discharged by Monday.     LOS: 5 days    Evonda Enge J 04/23/2013

## 2013-04-24 NOTE — Progress Notes (Signed)
Discharged home with home health PT.  Patient and wife educated on discharge instructions.  No new prescriptions needed.  Walker and bedside commode for home delivered to patient room.  Telemetry discontinued.  IV removed.  Changed suprapubic dressing and educated wife on signs of infection.  Patient given leg strap catheter bag.  Gathered belongings and verbalized return of all belongings.  Volunteer services escorted patient out via wheelchair.

## 2013-04-24 NOTE — Progress Notes (Signed)
Patient ID: Jack [name], male   DOB: 1945-10-15, 67 y.o.   MRN: [id number]   [facility] Progress Note    Assessment/ Plan:   1. AKI/CKD with volume overload: Renal function continues to improve with in creatinine/improving urine output status post relief of obstruction with suprapubic tube and bilateral stenting. Volume status much better following diuresis. Acceptable to discharge him today. He chooses to follow up with Dr.Hanspal of urology to monitor his renal function transportation is difficult for him to come to [facility] for structured/scheduled renal followup. I also encouraged him to follow closely with his primary care provider for management of recurrent edema and another set of eyes on his renal function. We would be happy to see him in the renal clinic if/as needed 2. Anemia of acute blood loss and chronic renal disease status post intravenous iron repletion, currently on ESA-anticipate native/endogenous erythropoiesis will now take over. 3. Renal hematoma, bladder hematoma from anticoag - anticoagulation now held, IVC filter placed - Follow up imaging per urology  4.PE- IVC filter placed   Subjective:   Reports to be feeling better-inquires about going home. Notes that edema is much better and is not short of breath.    Objective:   BP 111/82  Pulse 84  Temp(Src) 97.9 F (36.6 C) (Oral)  Resp 21  Ht 5\' 9"  (1.753 m)  Wt 124.286 kg (274 lb)  BMI 40.44 kg/m2  SpO2 98%  Intake/Output Summary (Last 24 hours) at 04/24/13 1057 Last data filed at 04/24/13 4098  Gross per 24 hour  Intake    600 ml  Output   2350 ml  Net  -1750 ml   Weight change:   Physical Exam: [id number] resting in recliner CVS: Pulse regular in rate and rhythm, 2/6 ejection systolic murmur Resp: Clear to auscultation bilaterally-no rales/rhonchi Abd: Soft, obese-clean dressing over suprapubic catheter insertion Ext: One plus edema bipedally  Imaging: No results  found.  Labs: BMET  Recent Labs Lab 04/18/13 1245 04/19/13 0511  04/19/13 1715  04/20/13 0529 04/20/13 0736 04/20/13 1045 04/20/13 1550 04/21/13 0336 04/22/13 0405 04/23/13 1005  NA 134* 132*  < > 134*  < > 137 137 138 138 141 142 138  K 4.6 4.8  < > 4.9  < > 3.9 3.8 4.0 4.4 4.2 4.2 3.8  CL 97 96  < > 97  < > 102 102 103 104 106 108 103  CO2 21 18*  < > 16*  < > 20 20 21 22 23 27 26   GLUCOSE 127* 145*  < > 131*  < > 148* 131* 127* 147* 123* 108* 155*  BUN 71* 86*  < > 94*  < > 82* 77* 76* 72* 64* 53* 43*  CREATININE 6.28* 8.27*  < > 9.79*  < > 6.88* 6.24* 5.59* 5.08* 3.86* 2.45* 1.85*  CALCIUM 7.8* 7.8*  < > 7.5*  < > 7.0* 6.9* 7.1* 7.3* 7.6* 8.2* 8.7  PHOS 7.7* 9.4*  --  9.6*  --   --   --   --  5.8*  --   --   --   < > = values in this interval not displayed. CBC  Recent Labs Lab 04/18/13 1245  04/20/13 1045 04/20/13 1550 04/20/13 2100 04/21/13 0336 04/22/13 0405  WBC 18.5*  < > 9.4 11.4* 10.6* 7.8 7.1  NEUTROABS 15.3*  --  7.7  --   --   --   --   HGB 9.2*  < >  8.2* 8.1* 7.9* 7.6* 7.7*  HCT 27.1*  < > 23.2* 23.2* 22.7* 22.0* 23.6*  MCV 82.1  < > 82.9 83.2 83.2 84.6 88.1  PLT 188  < > 162 177 175 177 185  < > = values in this interval not displayed.  Medications:    . darbepoetin (ARANESP) injection - NON-DIALYSIS  60 mcg Subcutaneous Q Tue-1800  . furosemide  40 mg Intravenous Daily  . sodium chloride  3 mL Intravenous Q12H    Zetta Bills, MD 04/24/2013, 10:57 AM

## 2013-04-24 NOTE — Discharge Summary (Signed)
Physician Discharge Summary  Jack [name]:[id number] DOB: [birth year] DOA: 04/18/2013  PCP: Simone Curia, MD  Admit date: 04/18/2013 Discharge date: 04/24/2013  Time spent: 45 minutes  Recommendations for Outpatient Follow-up:  1. Follow up on BMP, Creatinine improved to 1.85 by 04/23/13 2. Follow up on CBC, Hg of 7.7 on 04/22/13  Discharge Diagnoses:  Principal Problem:   Acute renal failure Active Problems:   Acute respiratory failure with hypoxia   Hematuria   Renal hematoma   Chronic renal insufficiency   Acute blood loss anemia   Pain   Pulmonary embolism   Discharge Condition: Stable  Diet recommendation: Heart Healthy Diet  Filed Weights   04/19/13 0500 04/21/13 0500 04/23/13 2058  Weight: 128.1 kg (282 lb 6.6 oz) 127.2 kg (280 lb 6.8 oz) 124.286 kg (274 lb)    History of present illness:  53 M underwent TURP and L ureteral stent 8/29. Admitted to [facility] 9/02 with acute PE and anticoagulation initiated. Hospitalization c/b hematuria, progressive rean linsufficiency and anemia requiring transfusion. Bladder irrigations initiated and CT scan abdomen performed 9/03 revealed L renal fluid subcapsular fluid collection thought to represent hematoma. Transferred to [facility] 9/05 for impending need for HD and placement of IVC filter.    Hospital Course:  Admitted to [facility] 9/02 with a VQ that was high probability for acute PE and placed on Anticoagulation.  Subsequently during the admission he developed hematuria, progressive renal insufficiency and anemia requiring transfusion. Bladder irrigations initiated and CT scan abdomen performed 9/03 revealed L renal fluid subcapsular fluid collection thought to represent hematoma. Transferred to [facility] 9/05 for impending need for HD and placement of IVC filter.  IVC filter placed on 9/5- dopplers were negative for DVT  He was intubated 9/6 - extubated on 9/8  9/6- renal ultrasound revealed Bilateral hydronephrosis with progressive  hydronephrosis on the right since previous CT scan. Left renal cyst or calyceal dilatation. Small subcapsular collection on the left may represent hematoma or urinoma- foley cath clotted and pt was taken to OR for bilateral ureteric stents (previous left renal stent replaced) and OPEN evacuation of clot    Procedures:  IVC filter placement  S/P intubation 9/6. Extubated 9/8  Consultations:  Nephrology  Interventional Radiology  Pulmonary Critical Care  Discharge Exam: Filed Vitals:   04/24/13 0922  BP: 111/82  Pulse: 84  Temp: 97.9 F (36.6 C)  Resp: 21    General: No acute distress, pleasant cooperative, anxious to go home Cardiovascular: Regular rate and rhythm, normal s1 s2 Respiratory: Clear to auscultation, breathing on room air.  Abdomen: Soft, nontender, nondistended  Discharge Instructions  Discharge Orders   Future Orders Complete By Expires   Call MD for:  difficulty breathing, headache or visual disturbances  As directed    Call MD for:  persistant dizziness or light-headedness  As directed    Call MD for:  severe uncontrolled pain  As directed    Diet - low sodium heart healthy  As directed    Discharge instructions  As directed    Comments:     Please keep all follow up appointment2   Increase activity slowly  As directed        Medication List    STOP taking these medications       ciprofloxacin 500 MG tablet  Commonly known as:  CIPRO     ketorolac 10 MG tablet  Commonly known as:  TORADOL     lisinopril 20 MG tablet  Commonly  known as:  PRINIVIL,ZESTRIL     OVER THE COUNTER MEDICATION     tamsulosin 0.4 MG Caps capsule  Commonly known as:  FLOMAX      TAKE these medications       allopurinol 300 MG tablet  Commonly known as:  ZYLOPRIM  Take 300 mg by mouth daily.     ANDROGEL PUMP 20.25 MG/ACT (1.62%) Gel  Generic drug:  Testosterone  Place 1 Act onto the skin daily.     docusate sodium 100 MG capsule  Commonly known as:   COLACE  Take 100 mg by mouth 3 (three) times daily.     oxyCODONE 5 MG immediate release tablet  Commonly known as:  Oxy IR/ROXICODONE  Take 5 mg by mouth every 4 (four) hours as needed for pain.       Allergies  Allergen Reactions  . Contrast Media [Iodinated Diagnostic Agents]     Blue dye       Follow-up Information   Call HANSPAL, PRITHVI P. (Please contact Dr. Meade Maw office for a f/u visit sometime next week for possible removal of the suprapubic tube and plan for stent removal. )    Specialty:  Urology      Follow up with [facility], MD In 1 week.   Specialty:  Internal Medicine   Contact information:   [id number] [place] [address] [phone]        The results of significant diagnostics from this hospitalization (including imaging, microbiology, ancillary and laboratory) are listed below for reference.    Significant Diagnostic Studies: Ir Ivc Filter Plmt / S&i /img Guid/mod Sed  04/18/2013   *RADIOLOGY REPORT*  Indication: High probability for pulmonary embolism based on ventilation and perfusion nuclear medicine examination.  Hematuria and left renal hematoma.  The patient is not a candidate for anticoagulation at this time.  PROCEDURE(S): IVC FILTER PLACEMENT; IVC VENOGRAM; ULTRASOUND FOR VASCULAR ACCESS  Physician:  Rachelle Hora. Henn, MD  Medications: Versed 4 mg, Fentanyl 100 mcg. A radiology nurse monitored the patient for moderate sedation.  Moderate sedation time: 33 minutes  Fluoroscopy time:  2 minutes and 36 seconds  Contrast:Carbon dioxide  Procedure:Informed consent was obtained for an IVC venogram and filter placement.  Ultrasound demonstrated a patent right common femoral vein.  Ultrasound images were obtained for documentation. The right groin was prepped and draped in a sterile fashion. Maximal barrier sterile technique was utilized including caps, mask, sterile gowns, sterile gloves, sterile drape, hand hygiene and skin antiseptic.  The skin was  anesthetized with 1% lidocaine. A 21 gauge needle was directed into the vein with ultrasound guidance and a micropuncture dilator set was placed.  A wire was advanced into the IVC.  The filter sheath was advanced over the wire into the IVC.  An IVC venogram was performed with carbon dioxide.  Fluoroscopic images were obtained for documentation. The location of the renal veins was confirmed by cannulating the renal veins with a Bentson wire.   A Denali filter was deployed below the lowest renal vein.  The vascular sheath was removed with manual compression.  Findings:IVC was patent.  Bilateral renal veins were identified. The filter was deployed below the lowest renal vein.  Impression:Successful placement of a retrievable IVC filter.   Original Report Authenticated By: Richarda Overlie, M.D.   Dg Retrograde Pyelogram  04/20/2013   *RADIOLOGY REPORT*  Clinical Data: Ureteral obstruction, hydronephrosis with acute kidney failure  RETROGRADE PYELOGRAM  Comparison: CT abdomen pelvis -  04/16/2013  Fluoroscopy time:  37 seconds  Findings:  A single spot intraoperative image of the lower pelvis is provided for review.  A cystoscope overlies the expected location of the urinary bladder.  Image demonstrates the caudal aspect of bilateral double J ureteral stents.   There is minimal contrast opacification of the distal aspect of the bilateral ureters.  The urinary bladder is underdistended but there is the suggestion of a persistent filling defect within the urinary bladder compatible with known bladder hematoma.  IMPRESSION: Post bilateral double-J ureteral stent placement.   Original Report Authenticated By: Tacey Ruiz, MD   US Renal  04/19/2013   *RADIOLOGY REPORT*  Clinical Data: Recent transurethral resection of prostate. Hypertension.  Colon cancer.  Ureteral calcification.  RENAL/URINARY TRACT ULTRASOUND COMPLETE  Comparison:  CT abdomen and pelvis 04/16/2013  Findings:  Right Kidney:  Right kidney measures 13.7 cm in  length.  There is diffuse parenchymal atrophy with prominent hydronephrosis. Hydronephrosis appears to be increasing since the previous CT scan.  Left Kidney:  Left kidney measures 13.5 cm length.  Cystic structure off of the upper pole appears to communicate with upper pole calix and may be a cyst or a calyceal diverticulum.  Moderate hydronephrosis of the left kidney.  This is similar to previous study.  The left ureteral stent is not identified and may have been removed in the interval.  There is a subcapsular fluid collection in the mid pole which appears smaller than on the previous study. This could be hematoma or urinoma.  No pararenal fluid collections.  Bladder:  Bladder volume measures 572 ml.  Foley catheter is in the bladder base and is open to drainage.  Recommend check Foley catheter function.  There is a large heterogeneous filling defect in the bladder measuring 7.8 x 8.2 x 7.2 cm.  This is of mixed but predominate hyperechoic appearance.  No flow is demonstrated on color flow Doppler imaging.  This likely represents a large blood clot.  No definite bladder wall thickening.  IMPRESSION: Large blood clot in the bladder with Foley catheter in place. Bilateral hydronephrosis with progressive hydronephrosis on the right since previous CT scan.  Left renal cyst or calyceal dilatation.  Small subcapsular collection on the left may represent hematoma or urinoma.   Original Report Authenticated By: Burman Nieves, M.D.   Dg Chest Port 1 View  04/21/2013   *RADIOLOGY REPORT*  Clinical Data: Short of breath, assess edema  PORTABLE CHEST - 1 VIEW  Comparison: Prior chest x-ray 04/20/2013  Findings: The patient has been extubated and the nasogastric tube removed.  Right IJ central venous catheter in unchanged position with the tip at the superior cavoatrial junction.  Slight interval decrease in pulmonary edema.  Bibasilar atelectasis versus infiltrate and likely small bilateral effusions persist.  Stable  cardiomegaly and mediastinal contours.  Atherosclerotic calcifications noted in the transverse aorta.  No pneumothorax. Healed right-sided rib fractures.  IMPRESSION:  1.  Interval extubation and removal of nasogastric tube. 2.  Stable to slightly improved pulmonary edema. 3.  Persistent bibasilar atelectasis versus infiltrates and likely small effusions.   Original Report Authenticated By: Malachy Moan, M.D.   Dg Chest Port 1 View  04/20/2013   *RADIOLOGY REPORT*  Clinical Data: Evaluate pulmonary edema  PORTABLE CHEST - 1 VIEW  Comparison: 04/19/2013; 04/18/2013  Findings:  Grossly unchanged enlarged cardiac silhouette and mediastinal contours given persistently reduced lung volumes.  Stable positioning of support apparatus.  No pneumothorax. Mild cephalization of flow  without frank evidence of edema.  Trace bilateral effusions are not excluded. No change to slight worsening in bibasilar heterogeneous opacities, left greater than right. There is unchanged deformity involving the posterior lateral aspect of the right eighth and ninth ribs.  No acute osseous abnormalities.  IMPRESSION: 1.  Stable positioning of support apparatus.  No pneumothorax. 2.  Persistent findings of cardiomegaly, hypoventilation and pulmonary venous congestion without frank evidence of edema.  3.  No change to slight worsening of bibasilar opacities, left greater than right, likely atelectasis.   Original Report Authenticated By: Tacey Ruiz, MD   Portable Chest Xray  04/19/2013   *RADIOLOGY REPORT*  Clinical Data: Endotracheal tube placement and central line placement.  PORTABLE CHEST - 1 VIEW  Comparison: Earlier today at 12:09 p.m.  Findings: Mildly degraded exam due to AP portable technique and patient body habitus.  Endotracheal tube 3.0 cm above carina.  Nasogastric tube extends beyond the  inferior aspect of the film.  Right IJ central line difficult to visualize centrally.  Followed to at least the level of the mid right  atrium.  No pneumothorax.  Remote right rib trauma. Cardiomegaly accentuated by AP portable technique.  Left costophrenic angle  excluded.  No definite pleural fluid. No congestive failure.  Mild right base volume loss.  IMPRESSION: Endotracheal tube appropriately positioned.  Right internal jugular line which is difficult to visualize centrally.  Followed to at least the level of the mid right atrium. Recommend retraction approximately 5.5 cm with repeat radiograph.  Cardiomegaly and low lung volumes, without congestive failure   Original Report Authenticated By: Jeronimo Greaves, M.D.   Dg Chest Port 1 View  04/19/2013   *RADIOLOGY REPORT*  Clinical Data: Evaluate for pulmonary edema.  PORTABLE CHEST - 1 VIEW  Comparison: Chest x-ray 04/18/2013.  Findings: Lung volumes are normal.  No consolidative airspace disease.  No pleural effusions.  No pneumothorax.  No pulmonary nodule or mass noted.  Pulmonary vasculature and the cardiomediastinal silhouette are within normal limits.  IMPRESSION: 1. No radiographic evidence of acute cardiopulmonary disease. Specifically, no evidence of pulmonary edema at this time.   Original Report Authenticated By: Trudie Reed, M.D.   Dg Chest Port 1 View  04/18/2013   *RADIOLOGY REPORT*  Clinical Data: Evaluate for edema  PORTABLE CHEST - 1 VIEW  Comparison: 04/16/2013  Findings: Low lung volumes are present taking this into consideration heart size is mildly enlarged and stable.  The mediastinal contour appears unchanged with the right hilum appearing somewhat less prominent than on the prior exam. Mild central peribronchial cuffing and interstitial haziness is seen and this may indicate early or incipient pulmonary edema however the interstitium may be accentuated by low lung volumes as well.  No interstitial septal lines, pleural effusion or overt pulmonary edema is seen. No focal infiltrates are noted.  Old healed rib fracture of the right posterior eighth rib is stable.   IMPRESSION: Subtle changes may indicate early or developing interstitial edema but no overt interstitial or alveolar edema is seen.  No focal infiltrates.   Original Report Authenticated By: Rhodia Albright, M.D.    Microbiology: Recent Results (from the past 240 hour(s))  MRSA PCR SCREENING     Status: None   Collection Time    04/18/13 11:07 AM      Result Value Range Status   MRSA by PCR NEGATIVE  NEGATIVE Final   Comment:            The GeneXpert MRSA  Assay (FDA     approved for NASAL specimens     only), is one component of a     comprehensive MRSA colonization     surveillance program. It is not     intended to diagnose MRSA     infection nor to guide or     monitor treatment for     MRSA infections.  CULTURE, BLOOD (ROUTINE X 2)     Status: None   Collection Time    04/19/13 10:02 PM      Result Value Range Status   Specimen Description BLOOD A-LINE   Final   Special Requests BOTTLES DRAWN AEROBIC AND ANAEROBIC 5CC EA   Final   Culture  Setup Time     Final   Value: 04/20/2013 06:20     Performed at [facility]   Culture     Final   Value:        BLOOD CULTURE RECEIVED NO GROWTH TO DATE CULTURE WILL BE HELD FOR 5 DAYS BEFORE ISSUING A FINAL NEGATIVE REPORT     Performed at [facility]   Report Status PENDING   Incomplete  CULTURE, BLOOD (ROUTINE X 2)     Status: None   Collection Time    04/19/13 11:40 PM      Result Value Range Status   Specimen Description BLOOD RIGHT HAND   Final   Special Requests BOTTLES DRAWN AEROBIC ONLY 5CC   Final   Culture  Setup Time     Final   Value: 04/20/2013 06:21     Performed at [facility]   Culture     Final   Value:        BLOOD CULTURE RECEIVED NO GROWTH TO DATE CULTURE WILL BE HELD FOR 5 DAYS BEFORE ISSUING A FINAL NEGATIVE REPORT     Performed at [facility]   Report Status PENDING   Incomplete     Labs: Basic Metabolic Panel:  Recent Labs Lab 04/18/13 1245 04/19/13 0511  04/19/13 1136  04/19/13 1715  04/20/13 1045 04/20/13 1550 04/21/13 0336 04/22/13 0405 04/23/13 1005  NA 134* 132* 131*  < > 134*  < > 138 138 141 142 138  K 4.6 4.8 4.8  < > 4.9  < > 4.0 4.4 4.2 4.2 3.8  CL 97 96 95*  < > 97  < > 103 104 106 108 103  CO2 21 18* 15*  < > 16*  < > 21 22 23 27 26   GLUCOSE 127* 145* 134*  < > 131*  < > 127* 147* 123* 108* 155*  BUN 71* 86* 94*  < > 94*  < > 76* 72* 64* 53* 43*  CREATININE 6.28* 8.27* 9.59*  < > 9.79*  < > 5.59* 5.08* 3.86* 2.45* 1.85*  CALCIUM 7.8* 7.8* 7.7*  < > 7.5*  < > 7.1* 7.3* 7.6* 8.2* 8.7  MG 1.9 1.9 1.9  --   --   --   --   --   --   --   --   PHOS 7.7* 9.4*  --   --  9.6*  --   --  5.8*  --   --   --   < > = values in this interval not displayed. Liver Function Tests:  Recent Labs Lab 04/18/13 1245 04/19/13 1715 04/19/13 2026 04/20/13 0529 04/20/13 1550 04/21/13 0336  AST 15  --  10 10  --  10  ALT 16  --  9 9  --  8  ALKPHOS 65  --  59 53  --  47  BILITOT 0.2*  --  0.2* 0.3  --  0.3  PROT 5.7*  --  4.9* 5.1*  --  4.9*  ALBUMIN 2.6* 2.3* 2.2* 2.4* 2.3* 2.1*   No results found for this basename: LIPASE, AMYLASE,  in the last 168 hours No results found for this basename: AMMONIA,  in the last 168 hours CBC:  Recent Labs Lab 04/18/13 1245  04/20/13 1045 04/20/13 1550 04/20/13 2100 04/21/13 0336 04/22/13 0405  WBC 18.5*  < > 9.4 11.4* 10.6* 7.8 7.1  NEUTROABS 15.3*  --  7.7  --   --   --   --   HGB 9.2*  < > 8.2* 8.1* 7.9* 7.6* 7.7*  HCT 27.1*  < > 23.2* 23.2* 22.7* 22.0* 23.6*  MCV 82.1  < > 82.9 83.2 83.2 84.6 88.1  PLT 188  < > 162 177 175 177 185  < > = values in this interval not displayed. Cardiac Enzymes:  Recent Labs Lab 04/18/13 1245 04/20/13 1048  TROPONINI <0.30 0.31*   BNP: BNP (last 3 results)  Recent Labs  04/18/13 1245  PROBNP 1762.0*   CBG:  Recent Labs Lab 04/18/13 1119 04/18/13 1523 04/18/13 1922 04/18/13 2340 04/19/13 0709  GLUCAP 139* 121* 134* 143* 204*        Signed:  Ulisses Vondrak  [facility] 04/24/2013, 11:09 AM

## 2013-04-26 LAB — CULTURE, BLOOD (ROUTINE X 2): Culture: NO GROWTH

## 2013-04-27 ENCOUNTER — Encounter (HOSPITAL_COMMUNITY): Payer: Self-pay | Admitting: Cardiology

## 2013-04-27 ENCOUNTER — Emergency Department (HOSPITAL_COMMUNITY)
Admission: EM | Admit: 2013-04-27 | Discharge: 2013-04-27 | Disposition: A | Discharge status: Home / Self Care | Payer: PRIVATE HEALTH INSURANCE | Attending: Emergency Medicine | Admitting: Emergency Medicine

## 2013-04-27 ENCOUNTER — Emergency Department (HOSPITAL_COMMUNITY): Payer: PRIVATE HEALTH INSURANCE

## 2013-04-27 DIAGNOSIS — I129 Hypertensive chronic kidney disease with stage 1 through stage 4 chronic kidney disease, or unspecified chronic kidney disease: Secondary | ICD-10-CM | POA: Insufficient documentation

## 2013-04-27 DIAGNOSIS — R064 Hyperventilation: Secondary | ICD-10-CM

## 2013-04-27 DIAGNOSIS — N189 Chronic kidney disease, unspecified: Secondary | ICD-10-CM | POA: Insufficient documentation

## 2013-04-27 DIAGNOSIS — J45901 Unspecified asthma with (acute) exacerbation: Secondary | ICD-10-CM | POA: Insufficient documentation

## 2013-04-27 DIAGNOSIS — Z86711 Personal history of pulmonary embolism: Secondary | ICD-10-CM | POA: Insufficient documentation

## 2013-04-27 DIAGNOSIS — Z85038 Personal history of other malignant neoplasm of large intestine: Secondary | ICD-10-CM | POA: Insufficient documentation

## 2013-04-27 DIAGNOSIS — F411 Generalized anxiety disorder: Secondary | ICD-10-CM | POA: Insufficient documentation

## 2013-04-27 DIAGNOSIS — M129 Arthropathy, unspecified: Secondary | ICD-10-CM | POA: Insufficient documentation

## 2013-04-27 DIAGNOSIS — Z79899 Other long term (current) drug therapy: Secondary | ICD-10-CM | POA: Insufficient documentation

## 2013-04-27 LAB — POCT I-STAT 3, ART BLOOD GAS (G3+)
Bicarbonate: 18.4 mEq/L — ABNORMAL LOW (ref 20.0–24.0)
Patient temperature: 98.6
TCO2: 19 mmol/L (ref 0–100)
pH, Arterial: 7.521 — ABNORMAL HIGH (ref 7.350–7.450)
pO2, Arterial: 135 mmHg — ABNORMAL HIGH (ref 80.0–100.0)

## 2013-04-27 LAB — BASIC METABOLIC PANEL
BUN: 42 mg/dL — ABNORMAL HIGH (ref 6–23)
CO2: 20 mEq/L (ref 19–32)
Calcium: 8.7 mg/dL (ref 8.4–10.5)
GFR calc non Af Amer: 26 mL/min — ABNORMAL LOW (ref >90)
Glucose, Bld: 126 mg/dL — ABNORMAL HIGH (ref 70–99)

## 2013-04-27 LAB — CBC
Hemoglobin: 9.3 g/dL — ABNORMAL LOW (ref 13.0–17.0)
MCH: 28.4 pg (ref 26.0–34.0)
MCHC: 32.4 g/dL (ref 30.0–36.0)
MCV: 87.5 fL (ref 78.0–100.0)
Platelets: 296 10*3/uL (ref 150–400)

## 2013-04-27 LAB — PRO B NATRIURETIC PEPTIDE: Pro B Natriuretic peptide (BNP): 1260 pg/mL — ABNORMAL HIGH (ref 0–125)

## 2013-04-27 LAB — POCT I-STAT TROPONIN I: Troponin i, poc: 0.04 ng/mL (ref 0.00–0.08)

## 2013-04-27 MED ORDER — LORAZEPAM 2 MG/ML IJ SOLN: 1.0000 mg | Freq: Once | INTRAMUSCULAR | Status: DC

## 2013-04-27 MED ORDER — ALPRAZOLAM 0.25 MG PO TABS: 0.2500 mg | ORAL_TABLET | Freq: Three times a day (TID) | ORAL | Status: DC | PRN

## 2013-04-27 MED ORDER — SODIUM CHLORIDE 0.9 % IV BOLUS (SEPSIS)
500.0000 mL | Freq: Once | INTRAVENOUS | Status: AC
  Administered 2013-04-27: 500 mL via INTRAVENOUS

## 2013-04-27 NOTE — ED Notes (Signed)
Pt to department via EMS from home- pt reports he has surgery related to kidney stones. Pt went home and came back here on Monday for PE's. Dc'ed home on Thursday and this morning he woke up and walked to the table became SOB with weakness. Also numbness in the legs. Pt with respiratory distress on EMS arrival, weak pulses. Breath sounds diminished in the lower lobes. States he can't lay back because he feels too SOB. Bp-94/68 after 500 bolus HR-100

## 2013-04-27 NOTE — ED Provider Notes (Signed)
CSN: [id number]     Arrival date & time 04/27/13  1125 History   First MD Initiated Contact with Patient 04/27/13 1129     Chief Complaint  Patient presents with  . Shortness of Breath    HPI The patient presented to the emergency room after having an episode of weakness and shortness of breath .  The patient had a complicated recent medical history. Patient had a procedure for kidney stones and no developing a pulmonary embolism. Subsequently he developed a renal hematoma felt to be related to his anticoagulation. This occurred at [facility]. Patient transferred to [facility] or an IVC filter was placed. The patient has been recovering at home. He was released from hospital this past Thursday. this morning when he was walking to a table he suddenly became short of breath associated with generalized weakness. The patient felt that his left leg was more affected but he did have numbness in both legs. Patient does get more short of breath when he lies flat. He denies any trouble with vomiting or diarrhea. He's not had any chest pain. He does feel lightheaded  Past Medical History  Diagnosis Date  . HTN (hypertension)   . Ureteral stone   . Colon cancer   . Shortness of breath   . Asthma   . Chronic kidney disease     acuts on chronic renal failure  . Arthritis   . PE (pulmonary embolism) 04/2013   Past Surgical History  Procedure Laterality Date  . Ureteral stent placement  04/10/2013  . Transurethral resection of prostate  04/10/2013  . Right colectomy  1996  . Tonsillectomy    . Cystoscopy with stent placement Bilateral 04/19/2013    Procedure: CYSTOSCOPY WITH STENT PLACEMENT;  Surgeon: Anner Crete, MD;  Location: [facility] OR;  Service: Urology;  Laterality: Bilateral;  . Cystoscopy N/A 04/19/2013    Procedure: CYSTOSCOPY WITH CLOT EVACUATION ;  Surgeon: Anner Crete, MD;  Location: [facility] OR;  Service: Urology;  Laterality: N/A;  . Laparotomy N/A 04/19/2013    Procedure: EXPLORATORY  LAPAROTOMY;  Surgeon: Anner Crete, MD;  Location: [facility] OR;  Service: Urology;  Laterality: N/A;  Exploratory Laparotomy with evacuation of blood clot in bladder with placement of super pubic tube    Family History  Problem Relation Age of Onset  . Urolithiasis     History  Substance Use Topics  . Smoking status: Never Smoker   . Smokeless tobacco: Current User    Types: Chew  . Alcohol Use: Yes     Comment: OCCASSIONAL    Review of Systems  All other systems reviewed and are negative.    Allergies  Contrast media  Home Medications   Current Outpatient Rx  Name  Route  Sig  Dispense  Refill  . allopurinol (ZYLOPRIM) 300 MG tablet   Oral   Take 300 mg by mouth daily.         Marland Kitchen docusate sodium (COLACE) 100 MG capsule   Oral   Take 100 mg by mouth 3 (three) times daily.         Marland Kitchen oxyCODONE (OXY IR/ROXICODONE) 5 MG immediate release tablet   Oral   Take 5 mg by mouth every 4 (four) hours as needed for pain.         . Testosterone (ANDROGEL PUMP) 20.25 MG/ACT (1.62%) GEL   Transdermal   Place 1 Act onto the skin daily.         Marland Kitchen  ALPRAZolam (XANAX) 0.25 MG tablet   Oral   Take 1 tablet (0.25 mg total) by mouth 3 (three) times daily as needed for anxiety.   21 tablet   0    BP 117/50  Pulse 97  Temp(Src) 97.6 F (36.4 C) (Oral)  Resp 22  SpO2 99% Physical Exam  Nursing note and vitals reviewed. Constitutional: No distress.  Mildly anxious  HENT:  Head: Normocephalic and atraumatic.  Right Ear: External ear normal.  Left Ear: External ear normal.  Eyes: Conjunctivae are normal. Right eye exhibits no discharge. Left eye exhibits no discharge. No scleral icterus.  Neck: Neck supple. No tracheal deviation present.  Cardiovascular: Normal rate, regular rhythm and intact distal pulses.   Pulmonary/Chest: Effort normal and breath sounds normal. No stridor. No respiratory distress. He has no wheezes. He has no rales.  Abdominal: Soft. Bowel sounds are  normal. He exhibits no distension. There is no tenderness. There is no rebound and no guarding.  Musculoskeletal: He exhibits no edema and no tenderness.  Neurological: He is alert. He has normal strength. No sensory deficit. Cranial nerve deficit:  no gross defecits noted. He exhibits normal muscle tone. He displays no seizure activity. Coordination normal.  Equal grip strength bilaterally, patient is able to lift both legs off the bed without difficulty, normal sensation throughout  Skin: Skin is warm and dry. No rash noted.  Psychiatric: He has a normal mood and affect.    ED Course  Procedures (including critical care time) EKG Sinus tachycardia rate 100 Normal axis, normal intervals Normal ST T waves EKG is not significantly changed from prior EKG dated 04/20/2013 Labs Review Labs Reviewed  BASIC METABOLIC PANEL - Abnormal; Notable for the following:    Glucose, Bld 126 (*)    BUN 42 (*)    Creatinine, Ser 2.44 (*)    GFR calc non Af Amer 26 (*)    GFR calc Af Amer 30 (*)    All other components within normal limits  CBC - Abnormal; Notable for the following:    WBC 16.6 (*)    RBC 3.28 (*)    Hemoglobin 9.3 (*)    HCT 28.7 (*)    All other components within normal limits  PRO B NATRIURETIC PEPTIDE - Abnormal; Notable for the following:    Pro B Natriuretic peptide (BNP) 1260.0 (*)    All other components within normal limits  PROTIME-INR - Abnormal; Notable for the following:    Prothrombin Time 16.6 (*)    All other components within normal limits  POCT I-STAT 3, BLOOD GAS (G3+) - Abnormal; Notable for the following:    pH, Arterial 7.521 (*)    pCO2 arterial 22.4 (*)    pO2, Arterial 135.0 (*)    Bicarbonate 18.4 (*)    Acid-base deficit 3.0 (*)    All other components within normal limits  APTT  POCT I-STAT TROPONIN I   Imaging Review Dg Chest 2 View  04/27/2013   *RADIOLOGY REPORT*  Clinical Data: Chest pain, extremity numbness  CHEST - 2 VIEW  Comparison:  Prior chest x-ray 04/21/2013  Findings: Stable low inspiratory volumes with perhaps trace subsegmental atelectasis.  The lungs are otherwise clear.  Cardiac and mediastinal contours are upper limits of normal but unchanged compared to prior.  No acute osseous abnormality.  Remote healed right-sided rib fractures.  No pleural effusion or pneumothorax.  IMPRESSION: Low inspiratory volumes, but otherwise no acute cardiopulmonary process.   Original Report Authenticated  By: Malachy Moan, M.D.   [phone]  patient is feeling much better now. He was given a dose of Ativan which has helped with the hyperventilation he was experiencing earlier.  MDM   1. Hyperventilation   2. Chronic renal insufficiency     I reviewed the patient's prior records. He was discharged home after having a IVC filter placed for his pulmonary emboli. The patient's laboratory tests are otherwise unremarkable. His renal insufficiency is slightly worse but not enough to cause the symptoms he is experiencing.  There may be a component of anxiety associated with his deconditioning and the fact that he does have pulmonary emboli that need to resolve.   Celene Kras, MD 04/27/13 1520

## 2013-04-30 ENCOUNTER — Inpatient Hospital Stay (HOSPITAL_COMMUNITY): Payer: PRIVATE HEALTH INSURANCE

## 2013-04-30 ENCOUNTER — Inpatient Hospital Stay (HOSPITAL_COMMUNITY)
Admission: AD | Admit: 2013-04-30 | Discharge: 2013-05-29 | DRG: 853 | Disposition: A | Discharge status: Skilled Nursing Facility | Payer: PRIVATE HEALTH INSURANCE | Source: Other Acute Inpatient Hospital | Attending: Internal Medicine | Admitting: Internal Medicine

## 2013-04-30 DIAGNOSIS — N1330 Unspecified hydronephrosis: Secondary | ICD-10-CM | POA: Diagnosis present

## 2013-04-30 DIAGNOSIS — E669 Obesity, unspecified: Secondary | ICD-10-CM | POA: Diagnosis present

## 2013-04-30 DIAGNOSIS — N189 Chronic kidney disease, unspecified: Secondary | ICD-10-CM

## 2013-04-30 DIAGNOSIS — R319 Hematuria, unspecified: Secondary | ICD-10-CM

## 2013-04-30 DIAGNOSIS — K922 Gastrointestinal hemorrhage, unspecified: Secondary | ICD-10-CM

## 2013-04-30 DIAGNOSIS — N170 Acute kidney failure with tubular necrosis: Secondary | ICD-10-CM | POA: Diagnosis present

## 2013-04-30 DIAGNOSIS — I824Y9 Acute embolism and thrombosis of unspecified deep veins of unspecified proximal lower extremity: Secondary | ICD-10-CM | POA: Diagnosis not present

## 2013-04-30 DIAGNOSIS — Z79899 Other long term (current) drug therapy: Secondary | ICD-10-CM

## 2013-04-30 DIAGNOSIS — F411 Generalized anxiety disorder: Secondary | ICD-10-CM | POA: Diagnosis not present

## 2013-04-30 DIAGNOSIS — M79A29 Nontraumatic compartment syndrome of unspecified lower extremity: Secondary | ICD-10-CM | POA: Diagnosis not present

## 2013-04-30 DIAGNOSIS — N179 Acute kidney failure, unspecified: Secondary | ICD-10-CM

## 2013-04-30 DIAGNOSIS — J9601 Acute respiratory failure with hypoxia: Secondary | ICD-10-CM

## 2013-04-30 DIAGNOSIS — D509 Iron deficiency anemia, unspecified: Secondary | ICD-10-CM | POA: Diagnosis present

## 2013-04-30 DIAGNOSIS — I129 Hypertensive chronic kidney disease with stage 1 through stage 4 chronic kidney disease, or unspecified chronic kidney disease: Secondary | ICD-10-CM | POA: Diagnosis present

## 2013-04-30 DIAGNOSIS — Z86718 Personal history of other venous thrombosis and embolism: Secondary | ICD-10-CM

## 2013-04-30 DIAGNOSIS — D62 Acute posthemorrhagic anemia: Secondary | ICD-10-CM

## 2013-04-30 DIAGNOSIS — Z6841 Body Mass Index (BMI) 40.0 and over, adult: Secondary | ICD-10-CM

## 2013-04-30 DIAGNOSIS — N183 Chronic kidney disease, stage 3 unspecified: Secondary | ICD-10-CM | POA: Diagnosis present

## 2013-04-30 DIAGNOSIS — I2699 Other pulmonary embolism without acute cor pulmonale: Secondary | ICD-10-CM

## 2013-04-30 DIAGNOSIS — Z86711 Personal history of pulmonary embolism: Secondary | ICD-10-CM

## 2013-04-30 DIAGNOSIS — R58 Hemorrhage, not elsewhere classified: Secondary | ICD-10-CM | POA: Diagnosis not present

## 2013-04-30 DIAGNOSIS — Z85038 Personal history of other malignant neoplasm of large intestine: Secondary | ICD-10-CM

## 2013-04-30 DIAGNOSIS — A0472 Enterocolitis due to Clostridium difficile, not specified as recurrent: Secondary | ICD-10-CM | POA: Diagnosis not present

## 2013-04-30 DIAGNOSIS — I82403 Acute embolism and thrombosis of unspecified deep veins of lower extremity, bilateral: Secondary | ICD-10-CM

## 2013-04-30 DIAGNOSIS — R5381 Other malaise: Secondary | ICD-10-CM | POA: Diagnosis not present

## 2013-04-30 DIAGNOSIS — E876 Hypokalemia: Secondary | ICD-10-CM | POA: Diagnosis not present

## 2013-04-30 DIAGNOSIS — R52 Pain, unspecified: Secondary | ICD-10-CM

## 2013-04-30 DIAGNOSIS — T79A0XD Compartment syndrome, unspecified, subsequent encounter: Secondary | ICD-10-CM

## 2013-04-30 DIAGNOSIS — K644 Residual hemorrhoidal skin tags: Secondary | ICD-10-CM | POA: Diagnosis not present

## 2013-04-30 DIAGNOSIS — E875 Hyperkalemia: Secondary | ICD-10-CM

## 2013-04-30 DIAGNOSIS — R310 Gross hematuria: Secondary | ICD-10-CM | POA: Diagnosis not present

## 2013-04-30 DIAGNOSIS — S37019D Minor contusion of unspecified kidney, subsequent encounter: Secondary | ICD-10-CM

## 2013-04-30 DIAGNOSIS — A419 Sepsis, unspecified organism: Principal | ICD-10-CM

## 2013-04-30 DIAGNOSIS — M216X9 Other acquired deformities of unspecified foot: Secondary | ICD-10-CM | POA: Diagnosis not present

## 2013-04-30 DIAGNOSIS — I517 Cardiomegaly: Secondary | ICD-10-CM

## 2013-04-30 LAB — CBC
MCH: 29.1 pg (ref 26.0–34.0)
MCV: 83.8 fL (ref 78.0–100.0)
Platelets: 159 10*3/uL (ref 150–400)
RDW: 15.5 % (ref 11.5–15.5)
WBC: 26.1 10*3/uL — ABNORMAL HIGH (ref 4.0–10.5)

## 2013-04-30 LAB — COMPREHENSIVE METABOLIC PANEL
AST: 20 U/L (ref 0–37)
Albumin: 2.7 g/dL — ABNORMAL LOW (ref 3.5–5.2)
Calcium: 8.2 mg/dL — ABNORMAL LOW (ref 8.4–10.5)
Chloride: 90 mEq/L — ABNORMAL LOW (ref 96–112)
Creatinine, Ser: 6.86 mg/dL — ABNORMAL HIGH (ref 0.50–1.35)
Total Protein: 6.4 g/dL (ref 6.0–8.3)

## 2013-04-30 LAB — SODIUM, URINE, RANDOM: Sodium, Ur: 21 mEq/L

## 2013-04-30 MED ORDER — PIPERACILLIN-TAZOBACTAM 3.375 G IVPB 30 MIN
3.3750 g | Freq: Once | INTRAVENOUS | Status: AC
  Administered 2013-04-30: 3.375 g via INTRAVENOUS
  Filled 2013-04-30: qty 50

## 2013-04-30 MED ORDER — PIPERACILLIN-TAZOBACTAM IN DEX 2-0.25 GM/50ML IV SOLN
2.2500 g | Freq: Three times a day (TID) | INTRAVENOUS | Status: DC
  Administered 2013-04-30 – 2013-05-03 (×9): 2.25 g via INTRAVENOUS
  Filled 2013-04-30 (×10): qty 50

## 2013-04-30 MED ORDER — ALPRAZOLAM 0.25 MG PO TABS
0.2500 mg | ORAL_TABLET | Freq: Three times a day (TID) | ORAL | Status: DC | PRN
  Administered 2013-05-24 (×2): 0.25 mg via ORAL
  Filled 2013-04-30 (×2): qty 1

## 2013-04-30 MED ORDER — STERILE WATER FOR INJECTION IV SOLN
INTRAVENOUS | Status: DC
  Administered 2013-04-30 – 2013-05-01 (×5): via INTRAVENOUS
  Filled 2013-04-30 (×9): qty 850

## 2013-04-30 MED ORDER — SODIUM CHLORIDE 0.9 % IV SOLN
INTRAVENOUS | Status: DC
  Administered 2013-04-29: 01:00:00 via INTRAVENOUS

## 2013-04-30 MED ORDER — PERFLUTREN LIPID MICROSPHERE
1.0000 mL | INTRAVENOUS | Status: AC | PRN
  Administered 2013-04-30: 2 mL via INTRAVENOUS
  Filled 2013-04-30: qty 10

## 2013-04-30 MED ORDER — WHITE PETROLATUM GEL
Status: AC
  Administered 2013-04-30: 0.2
  Filled 2013-04-30: qty 5

## 2013-04-30 MED ORDER — SODIUM CHLORIDE 0.9 % IV SOLN
2000.0000 mg | Freq: Once | INTRAVENOUS | Status: AC
  Administered 2013-04-30: 2000 mg via INTRAVENOUS
  Filled 2013-04-30: qty 2000

## 2013-04-30 MED ORDER — SODIUM CHLORIDE 0.9 % IV BOLUS (SEPSIS)
500.0000 mL | Freq: Once | INTRAVENOUS | Status: AC
  Administered 2013-04-30: 500 mL via INTRAVENOUS

## 2013-04-30 MED ORDER — VANCOMYCIN HCL IN DEXTROSE 1-5 GM/200ML-% IV SOLN
1000.0000 mg | Freq: Two times a day (BID) | INTRAVENOUS | Status: DC
  Administered 2013-04-30: 1000 mg via INTRAVENOUS
  Filled 2013-04-30: qty 200

## 2013-04-30 MED ORDER — TESTOSTERONE 50 MG/5GM (1%) TD GEL
5.0000 g | Freq: Every day | TRANSDERMAL | Status: DC
  Administered 2013-04-30: 5 g via TRANSDERMAL
  Filled 2013-04-30: qty 5

## 2013-04-30 MED ORDER — TESTOSTERONE 20.25 MG/ACT (1.62%) TD GEL: 1.0000 | Freq: Every day | TRANSDERMAL | Status: DC

## 2013-04-30 MED ORDER — SODIUM CHLORIDE 0.9 % IJ SOLN
3.0000 mL | Freq: Two times a day (BID) | INTRAMUSCULAR | Status: DC
  Administered 2013-04-30 – 2013-05-29 (×32): 3 mL via INTRAVENOUS

## 2013-04-30 MED ORDER — PIPERACILLIN-TAZOBACTAM 3.375 G IVPB
3.3750 g | Freq: Three times a day (TID) | INTRAVENOUS | Status: DC
  Administered 2013-04-30: 3.375 g via INTRAVENOUS
  Filled 2013-04-30 (×2): qty 50

## 2013-04-30 MED ORDER — OXYCODONE HCL 5 MG PO TABS
5.0000 mg | ORAL_TABLET | ORAL | Status: DC | PRN
  Administered 2013-04-30 – 2013-05-24 (×25): 5 mg via ORAL
  Filled 2013-04-30 (×26): qty 1

## 2013-04-30 NOTE — Progress Notes (Signed)
PHARMACY NOTE  Pharmacy Consult for :  Vancomycin, Zosyn Indication:  Empiric antibiotics for [facility] Problems Principal Problem:   Sepsis Active Problems:   Acute renal failure   Chronic renal insufficiency   Hyperkalemia   Weight: 124.3 kg  Vitals: BP 102/56  Pulse 86  Temp(Src) 98.3 F (36.8 C) (Oral)  Resp 20  Ht 5\' 9"  (1.753 m)  Wt 274 lb 0.5 oz (124.3 kg)  BMI 40.45 kg/m2  SpO2 100%  Labs:  Recent Labs  04/30/13 0300  WBC 26.1*  HGB 8.6*  PLT 159  CREATININE 6.86*   Estimated Creatinine Clearance: 13.8 ml/min (by C-G formula based on Cr of 6.86).   Microbiology: Recent Results (from the past 720 hour(s))  MRSA PCR SCREENING     Status: None   Collection Time    04/18/13 11:07 AM      Result Value Range Status   MRSA by PCR NEGATIVE  NEGATIVE Final   Comment:            The GeneXpert MRSA Assay (FDA     approved for NASAL specimens     only), is one component of a     comprehensive MRSA colonization     surveillance program. It is not     intended to diagnose MRSA     infection nor to guide or     monitor treatment for     MRSA infections.  CULTURE, BLOOD (ROUTINE X 2)     Status: None   Collection Time    04/19/13 10:02 PM      Result Value Range Status   Specimen Description BLOOD A-LINE   Final   Special Requests BOTTLES DRAWN AEROBIC AND ANAEROBIC 5CC EA   Final   Culture  Setup Time     Final   Value: 04/20/2013 06:20     Performed at [facility]   Culture     Final   Value: NO GROWTH 5 DAYS     Performed at [facility]   Report Status 04/26/2013 FINAL   Final  CULTURE, BLOOD (ROUTINE X 2)     Status: None   Collection Time    04/19/13 11:40 PM      Result Value Range Status   Specimen Description BLOOD RIGHT HAND   Final   Special Requests BOTTLES DRAWN AEROBIC ONLY 5CC   Final   Culture  Setup Time     Final   Value: 04/20/2013 06:21     Performed at [facility]   Culture      Final   Value: NO GROWTH 5 DAYS     Performed at [facility]   Report Status 04/26/2013 FINAL   Final    Anti-infectives Anti-infectives   Start     Dose/Rate Route Frequency Ordered Stop   04/30/13 1200  vancomycin (VANCOCIN) IVPB 1000 mg/200 mL premix  Status:  Discontinued     1,000 mg 200 mL/hr over 60 Minutes Intravenous Every 12 hours 04/30/13 0245 04/30/13 1326   04/30/13 0800  piperacillin-tazobactam (ZOSYN) IVPB 3.375 g  Status:  Discontinued     3.375 g 12.5 mL/hr over 240 Minutes Intravenous Every 8 hours 04/30/13 0245 04/30/13 1326   04/30/13 0300  vancomycin (VANCOCIN) 2,000 mg in sodium chloride 0.9 % 500 mL IVPB     2,000 mg 250 mL/hr over 120 Minutes Intravenous  Once 04/30/13 0245 04/30/13 0510   04/30/13 0300  piperacillin-tazobactam (ZOSYN) IVPB  3.375 g     3.375 g 100 mL/hr over 30 Minutes Intravenous  Once 04/30/13 0245 04/30/13 0340      Assessment:  Patient on Empiric Vancomycin and Zosyn for Sepsis/UTI.    Renal function has deteriorated over last 3 days.  Scr 2.44 > 6.86.  CrCl down to 13.8 ml/min.  Admission labs not available when antibiotics initially dosed.  Antibiotics not adjusted for current renal function.  Goal of Therapy:   Vancomycin trough level 15-20 mcg/ml Zosyn selected for infection/cultures and adjusted for renal function.   Plan:   Hold Vancomycin for now.  Will draw random Vancomycin level tonight and re-dose as indicated.  Change Zosyn to 2.25 gm IV q 8 hours. Follow up SCr, UOP, cultures, clinical course and adjust as clinically indicated.   Laurena Bering, Pharm.D.  04/30/2013 1:32 PM

## 2013-04-30 NOTE — H&P (Signed)
[facility] History and Physical  Jack [name] QMV:[id number] DOB: Dec 21, 1945 DOA: 04/30/2013  Referring physician: ED PCP: Simone Curia, MD  Chief Complaint: Sepsis  HPI: Jack [name] is a 67 y.o. male with complicated medical history including a recent stay at [facility] for PE S/P TURP got bilateral renal stents for obstruction, IVC filter for his DVT (anemic so couldn't be anticoagulated), for more information see discharge summary on 04/24/13.  Patient was seen again in the ED at [facility] today, where his work up demonstrated creatinine of 6.0, WBC of 37k, potassium of 5.8, sodium 127, tachycardia, and mild hypotension.  VQ scan was repeated which was now negative for PE.  He was given kayexelate in the ED, 2L NS, and transferred.  Review of Systems: Negative for diarrhea (until he was given the kayexalate), No Dysuria (has a suprapubic catheter in), Does report drainage from his surgical wound site when it "busted open" this morning, 12 systems reviewed and otherwise negative.  Past Medical History  Diagnosis Date  . HTN (hypertension)   . Ureteral stone   . Colon cancer   . Shortness of breath   . Asthma   . Chronic kidney disease     acuts on chronic renal failure  . Arthritis   . PE (pulmonary embolism) 04/2013   Past Surgical History  Procedure Laterality Date  . Ureteral stent placement  04/10/2013  . Transurethral resection of prostate  04/10/2013  . Right colectomy  1996  . Tonsillectomy    . Cystoscopy with stent placement Bilateral 04/19/2013    Procedure: CYSTOSCOPY WITH STENT PLACEMENT;  Surgeon: Anner Crete, MD;  Location: [facility] OR;  Service: Urology;  Laterality: Bilateral;  . Cystoscopy N/A 04/19/2013    Procedure: CYSTOSCOPY WITH CLOT EVACUATION ;  Surgeon: Anner Crete, MD;  Location: [facility] OR;  Service: Urology;  Laterality: N/A;  . Laparotomy N/A 04/19/2013    Procedure: EXPLORATORY LAPAROTOMY;  Surgeon: Anner Crete, MD;  Location: [facility] OR;  Service: Urology;   Laterality: N/A;  Exploratory Laparotomy with evacuation of blood clot in bladder with placement of super pubic tube    Social History:  reports that he has never smoked. His smokeless tobacco use includes Chew. He reports that  drinks alcohol. He reports that he does not use illicit drugs.   Allergies  Allergen Reactions  . Contrast Media [Iodinated Diagnostic Agents]     Blue dye    Family History  Problem Relation Age of Onset  . Urolithiasis       Prior to Admission medications   Medication Sig Start Date End Date Taking? Authorizing Provider  allopurinol (ZYLOPRIM) 300 MG tablet Take 300 mg by mouth daily.    Historical Provider, MD  ALPRAZolam Prudy Feeler) 0.25 MG tablet Take 1 tablet (0.25 mg total) by mouth 3 (three) times daily as needed for anxiety. 04/27/13   Celene Kras, MD  docusate sodium (COLACE) 100 MG capsule Take 100 mg by mouth 3 (three) times daily.    Historical Provider, MD  oxyCODONE (OXY IR/ROXICODONE) 5 MG immediate release tablet Take 5 mg by mouth every 4 (four) hours as needed for pain.    Historical Provider, MD  Testosterone (ANDROGEL PUMP) 20.25 MG/ACT (1.62%) GEL Place 1 Act onto the skin daily.    Historical Provider, MD   Physical Exam: Filed Vitals:   04/30/13 0230  BP: 92/52  Pulse: 101  Temp:   Resp: 22    General:  NAD, resting comfortably  in bed Eyes: PEERLA EOMI ENT: mucous membranes moist Neck: supple w/o JVD Cardiovascular: RRR w/o MRG Respiratory: CTA B Abdomen: soft, nt, nd, bs+, suprapubic catheter in place, surgical wound with dressing in place Skin: no rash nor lesion Musculoskeletal: MAE, full ROM all 4 extremities Psychiatric: normal tone and affect Neurologic: AAOx3, grossly non-focal  Labs on Admission:  Basic Metabolic Panel:  Recent Labs Lab 04/23/13 1005 04/27/13 1240  NA 138 137  K 3.8 4.1  CL 103 100  CO2 26 20  GLUCOSE 155* 126*  BUN 43* 42*  CREATININE 1.85* 2.44*  CALCIUM 8.7 8.7   Liver Function  Tests: No results found for this basename: AST, ALT, ALKPHOS, BILITOT, PROT, ALBUMIN,  in the last 168 hours No results found for this basename: LIPASE, AMYLASE,  in the last 168 hours No results found for this basename: AMMONIA,  in the last 168 hours CBC:  Recent Labs Lab 04/27/13 1240  WBC 16.6*  HGB 9.3*  HCT 28.7*  MCV 87.5  PLT 296   Cardiac Enzymes: No results found for this basename: CKTOTAL, CKMB, CKMBINDEX, TROPONINI,  in the last 168 hours  BNP (last 3 results)  Recent Labs  04/18/13 1245 04/27/13 1240  PROBNP 1762.0* 1260.0*   CBG: No results found for this basename: GLUCAP,  in the last 168 hours  Radiological Exams on Admission: No results found.  EKG: Independently reviewed.  Assessment/Plan Principal Problem:   Sepsis Active Problems:   Acute renal failure   Chronic renal insufficiency   Hyperkalemia   1. Sepsis - unclear source, urology has seen patient and does not feel that surgical wound appears infected, imaging study of his abdomen is also pending to make sure he is not leaking urine into his abdomen.  UTI is a possibility and his UA would certainly seem to suggest this.  Treating with broad spectrum ABx including zosyn and vancomycin empirically.  Blood cultures ordered.  Do not feel C.Diff likely given no diarrhea prior to receiving kayexalate. 2. AKI on CKD - quite possibly pre-renal, treating as such with 2L NS in ED, now have patient on 125 cc/hr of bicarb per Dr. Deterding's recommendation given that his bicarb in ED was low.  Strict intake and output.  Imaging abdomen to make sure he isnt leaking urine and reabsorbing per Urology's recommendation.  High risk for requiring dialysis but his kidney function was already close to ESRD even at baseline before his recent acute illnesses.  Will treat his hypotension aggressively as a last attempt to avoid dialysis, if this fails and he goes into fluid overload then this shows that patient will need  dialysis, Dr. Darrick Penna agrees with this approach in this patient and agrees with the IVF rate of 125 cc/hr at this time. 3. Hyperkalemia - kayexelate in ED, getting bicarb via GTT, repeat CMP ordered.    Urology has already seen patient in consult, note in chart, nephrology consulted and recommendations as above.   Code Status: Full (must indicate code status--if unknown or must be presumed, indicate so) Family Communication: Spoke with family at bedside about plan, all questions answered (indicate person spoken with, if applicable, with phone number if by telephone) Disposition Plan: Admit to inpatient SDU (indicate anticipated LOS)  Time spent:  GARDNER, JARED M. [facility] Pager [phone]  If 7PM-7AM, please contact night-coverage [email or web address] Password [facility] 04/30/2013, 2:55 AM

## 2013-04-30 NOTE — Consult Note (Signed)
Reason for Consult:Acute Renal Failure, Wound Dehiscence, Prostatic Hyperplasia  Referring Physician: Julian Reil DO  Jack [name] is an 67 y.o. male.  HPI:  1 - Wound Problems After Recent TURP and Subsequent Open Bladder Evacuation - Pt s/p transurethral resection of prostate with left ureteroscopic stone manipulation 04/13/13 at [facility] by [facility] MD. Subsequently developed acute renal failure, bilateral hydronephrosis, left perniephric hematoma and clot retention requiring transger to [facility] 9/5 with urgent cystoscopy, bilateral ureteral stent placement, and open bladder evacuation with SP Tube placement by Annabell Howells MD. Has known rt renal atrophy. Was discharged and doing well and had sutures removed 9/16 by [place] with subsequent opening of wound and replacement of sutures in office 9/17. Presented to [facility] ER later 9/17 with leg swelling, recurrent acute renal failure and transferred once again to [facility] hospital.  Denies wound drainage or SPT problems. He has not had voiding trial yet.  2 - Acute Renal Failure, Recent Hydronephrosis - Pt with Cr to 6 range with K 5.8 by labs in [place] pre transfer. Making small volume urine per report. Had bilateral JJ stents 9/5 as per above to maximize drainage.  PMH sig for DVT,PE with recent IVC filter, currently NOT on anticoagulation.  Past Medical History  Diagnosis Date  . HTN (hypertension)   . Ureteral stone   . Colon cancer   . Shortness of breath   . Asthma   . Chronic kidney disease     acuts on chronic renal failure  . Arthritis   . PE (pulmonary embolism) 04/2013    Past Surgical History  Procedure Laterality Date  . Ureteral stent placement  04/10/2013  . Transurethral resection of prostate  04/10/2013  . Right colectomy  1996  . Tonsillectomy    . Cystoscopy with stent placement Bilateral 04/19/2013    Procedure: CYSTOSCOPY WITH STENT PLACEMENT;  Surgeon: Anner Crete, MD;  Location: [facility] OR;  Service: Urology;   Laterality: Bilateral;  . Cystoscopy N/A 04/19/2013    Procedure: CYSTOSCOPY WITH CLOT EVACUATION ;  Surgeon: Anner Crete, MD;  Location: [facility] OR;  Service: Urology;  Laterality: N/A;  . Laparotomy N/A 04/19/2013    Procedure: EXPLORATORY LAPAROTOMY;  Surgeon: Anner Crete, MD;  Location: [facility] OR;  Service: Urology;  Laterality: N/A;  Exploratory Laparotomy with evacuation of blood clot in bladder with placement of super pubic tube     Family History  Problem Relation Age of Onset  . Urolithiasis      Social History:  reports that he has never smoked. His smokeless tobacco use includes Chew. He reports that  drinks alcohol. He reports that he does not use illicit drugs.  Allergies:  Allergies  Allergen Reactions  . Contrast Media [Iodinated Diagnostic Agents]     Blue dye    Medications: I have reviewed the patient's current medications.  No results found for this or any previous visit (from the past 48 hour(s)).  No results found.  Review of Systems  Constitutional: Positive for malaise/fatigue. Negative for fever and chills.  HENT: Negative.   Eyes: Negative.   Respiratory: Negative.   Cardiovascular: Negative.   Gastrointestinal: Negative.  Negative for nausea, vomiting and abdominal pain.  Genitourinary: Negative.  Negative for hematuria and flank pain.  Musculoskeletal: Negative.   Skin: Negative.   Neurological: Negative.   Endo/Heme/Allergies: Negative.   Psychiatric/Behavioral: Negative.    Blood pressure 92/52, pulse 101, temperature 97.4 F (36.3 C), temperature source Oral, resp. rate 22, height 5'  9" (1.753 m), weight 124.3 kg (274 lb 0.5 oz), SpO2 100.00%. Physical Exam  Constitutional: He is oriented to person, place, and time. He appears well-developed and well-nourished.  Family at bedside  HENT:  Head: Normocephalic and atraumatic.  Eyes: EOM are normal. Pupils are equal, round, and reactive to light.  Neck: Normal range of motion. Neck supple.   Cardiovascular: Normal rate.   Respiratory: Effort normal.  GI: Soft.  Recent lower midline incision c/d/i without drainage or purlulence. Irrigated with 150cc NS in 70cc aliquots quantitatively w/o clots.  Genitourinary: Penis normal.  Musculoskeletal: Normal range of motion.  Neurological: He is alert and oriented to person, place, and time.  Skin: Skin is warm and dry.  Psychiatric: He has a normal mood and affect. His behavior is normal. Judgment and thought content normal.    Assessment/Plan: 1 - Wound Problems After Recent TURP and Subsequent Open Bladder Evacuation - Wound relatively unimpressive on exam. Edges together, no gross fluid or purulence even with bladder irrigation. No urgent surgical intervention warranted. Will obtain CT abd pelvis with and without bladder contrast to rule out major leak or evidence of abscess.  2 - Acute Renal Failure, Recent Hydronephrosis - PT with recent tachycardia, hypotension. Likely multifactorial. CT as per above to verify no new / worsening hydro or large intraperitoneal leak. If new large hydro, may need nephrostomy. Agree with aggressive fluid recussitation. Pt with very realistic expectations and knows he may need renal replacement in short term if not improving.  3 - Will Follow, Call with questions anytime.   Jalee Saine 04/30/2013, 2:52 AM

## 2013-04-30 NOTE — Consult Note (Signed)
[facility] Consult Note  Reason for Consult: Acute renal failure Referring Physician: Dr. Julian Reil ([facility])  HPI:  Jack [name] is an 67 y.o. male with a recent complex medical history consisting of TURP done in [place] on 8/31 with subsequent development of acute renal failure, bilateral hydronephrosis, DVT/PE with placement of IVC filter (not anticoag candidate). Recently underwent bilateral ureteral stent placement with open bladder evacuation & suprapubic catheter placement, was discharged home on 9/11. On 9/14 returned to ER c/o SOB and had creatinine of 2.44. Re-presented to [facility] yesterday, found to have marked leukocytosis, hypotension, and again, acute renal failure with Cr >6 and K 5.8. Received kayexalate, 2L NS and was transferred to [facility].  Was also diagnosed yesterday with extensive bilateral lower extremity DVT's but a VQ scan with low probability of PE per [facility] records.  Patient reports that on Sunday his urine output started to slow down and his urine became darker, to the point of becoming almost black. He denies fevers at home. Has had some shortness of breath but no chest pain or abdominal pain. Has noted a lot of swelling in his legs which is what caused him to present to the ER at [facility].  Has PMH of nephrolithiasis, denies hx of UTI's in past.  Past Medical History  Diagnosis Date  . HTN (hypertension)   . Ureteral stone   . Colon cancer   . Shortness of breath   . Asthma   . Chronic kidney disease     acuts on chronic renal failure  . Arthritis   . PE (pulmonary embolism) 04/2013    Past Surgical History  Procedure Laterality Date  . Ureteral stent placement  04/10/2013  . Transurethral resection of prostate  04/10/2013  . Right colectomy  1996  . Tonsillectomy    . Cystoscopy with stent placement Bilateral 04/19/2013    Procedure: CYSTOSCOPY WITH STENT PLACEMENT;  Surgeon: Anner Crete, MD;  Location: [facility] OR;   Service: Urology;  Laterality: Bilateral;  . Cystoscopy N/A 04/19/2013    Procedure: CYSTOSCOPY WITH CLOT EVACUATION ;  Surgeon: Anner Crete, MD;  Location: [facility] OR;  Service: Urology;  Laterality: N/A;  . Laparotomy N/A 04/19/2013    Procedure: EXPLORATORY LAPAROTOMY;  Surgeon: Anner Crete, MD;  Location: [facility] OR;  Service: Urology;  Laterality: N/A;  Exploratory Laparotomy with evacuation of blood clot in bladder with placement of super pubic tube     Family History  Problem Relation Age of Onset  . Urolithiasis    neg for renal disease  Social History:  reports that he has never smoked. His smokeless tobacco use includes Chew. He reports that  drinks alcohol. He reports that he does not use illicit drugs.  Allergies:  Allergies  Allergen Reactions  . Contrast Media [Iodinated Diagnostic Agents]     Blue dye    Medications:  Prior to Admission:  Prescriptions prior to admission  Medication Sig Dispense Refill  . ALPRAZolam (XANAX) 0.25 MG tablet Take 1 tablet (0.25 mg total) by mouth 3 (three) times daily as needed for anxiety.  21 tablet  0  . docusate sodium (COLACE) 100 MG capsule Take 100 mg by mouth 3 (three) times daily.      Marland Kitchen oxyCODONE (OXY IR/ROXICODONE) 5 MG immediate release tablet Take 5 mg by mouth every 4 (four) hours as needed for pain.      . Testosterone (ANDROGEL PUMP) 20.25 MG/ACT (1.62%) GEL Place 1 Act onto  the skin daily.        Results for orders placed during the hospital encounter of 04/30/13 (from the past 48 hour(s))  CBC     Status: Abnormal   Collection Time    04/30/13  3:00 AM      Result Value Range   WBC 26.1 (*) 4.0 - 10.5 K/uL   RBC 2.96 (*) 4.22 - 5.81 MIL/uL   Hemoglobin 8.6 (*) 13.0 - 17.0 g/dL   HCT 16.1 (*) 09.6 - 04.5 %   MCV 83.8  78.0 - 100.0 fL   MCH 29.1  26.0 - 34.0 pg   MCHC 34.7  30.0 - 36.0 g/dL   RDW 40.9  81.1 - 91.4 %   Platelets 159  150 - 400 K/uL  COMPREHENSIVE METABOLIC PANEL     Status: Abnormal   Collection Time     04/30/13  3:00 AM      Result Value Range   Sodium 126 (*) 135 - 145 mEq/L   Potassium 5.1  3.5 - 5.1 mEq/L   Chloride 90 (*) 96 - 112 mEq/L   CO2 15 (*) 19 - 32 mEq/L   Glucose, Bld 122 (*) 70 - 99 mg/dL   BUN 94 (*) 6 - 23 mg/dL   Creatinine, Ser 7.82 (*) 0.50 - 1.35 mg/dL   Calcium 8.2 (*) 8.4 - 10.5 mg/dL   Total Protein 6.4  6.0 - 8.3 g/dL   Albumin 2.7 (*) 3.5 - 5.2 g/dL   AST 20  0 - 37 U/L   ALT 15  0 - 53 U/L   Alkaline Phosphatase 98  39 - 117 U/L   Total Bilirubin 0.5  0.3 - 1.2 mg/dL   GFR calc non Af Amer 7 (*) >90 mL/min   GFR calc Af Amer 9 (*) >90 mL/min   Comment: (NOTE)     The eGFR has been calculated using the CKD EPI equation.     This calculation has not been validated in all clinical situations.     eGFR's persistently <90 mL/min signify possible Chronic Kidney     Disease.    No results found.  ROS: Per HPI, otherwise negative  BP 88/47  Pulse 92  Temp(Src) 98.3 F (36.8 C) (Oral)  Resp 17  Ht 5\' 9"  (1.753 m)  Wt 274 lb 0.5 oz (124.3 kg)  BMI 40.45 kg/m2  SpO2 100% Gen: NAD, pleasant, cooperative HEENT: NCAT, MM slightly dry Heart: RRR, 2+ pulses in all four extremities Lungs: CTAB, NWOB Abd: +BS, soft, nontender, suprapubic catheter site clean/dry, dressing clean, no surrounding erythema or tenderness Ext: marked 3+ edema bilateral lower extremities. No asterixis. Skin: no rashes noted Neuro: grossly nonfocal, speech normal  Assessment/Plan: 1. Acute renal insufficiency: of unclear etiology, possibly prerenal given persistent hypotension/sepsis, but also must question postrenal as he has recently undergone instrumentation and stenting of bilateral ureters - agree with imaging of abdomen to evaluate for urine leak or hydronephrosis, as there seems to be a delay in obtaining CT scan will order renal ultrasound to evaluate for hydronephrosis. - will order labs to calculate FENa - avoid nephrotoxic medications, agree with IVF in the event that  this is prerenal - may yet require dialysis if renal function does not improve, no indication for emergent dialysis at this time - will continue to follow along with you   2. Hyperkalemia: - improved to 5.1 s/p kayexalate which was given at [facility] - continue to monitor BMET closely  3. Hypotension: possibly related to heart strain from recent PE/NSTEMI, also could be related to extensive bilateral lower extremity DVT's causing low preload - consider obtaining echo  4. Infection/sepsis workup & management per primary team  5. Bilateral lower extremity DVT's - has IVC filter in place. Anticoagulation decision making per primary team  Jack Feinstein, MD Family Medicine PGY-2  Renal Service Resident I have seen and examined this patient and agree with plan per Dr Pollie Meyer.  16XW WM just DC'd from hospital for ARF from bil hydronephrosis following TURP with bleeding.  Pt had PE which complicated things and a filter was placed.  Since DC has noticed swelling of lower ext and now has Bil DVT's.  BP has been low 80-90's with elevated WBC but no fevers.  He either has recurrent obstruction or ATN from hypotension.  Plan was to get CT scan with intravesicle contrast but pt says he has allergy to contrast.  Will get [place] for now.  Daily Scr.  Cont IV fluids. Check echo.  Discussed possibility of HD and he is agreeable if needed. Toshiyuki Fredell T,MD 04/30/2013 12:35 PM

## 2013-04-30 NOTE — Progress Notes (Signed)
ANTIBIOTIC CONSULT NOTE - INITIAL  Pharmacy Consult for Vancocin and Zosyn Indication: UTI vs wound infection  Allergies  Allergen Reactions  . Contrast Media [Iodinated Diagnostic Agents]     Blue dye    Patient Measurements: Height: 5\' 9"  (175.3 cm) Weight: 274 lb 0.5 oz (124.3 kg) IBW/kg (Calculated) : 70.7  Vital Signs: Temp: 97.4 F (36.3 C) (09/17 0050) Temp src: Oral (09/17 0050) BP: 92/52 mmHg (09/17 0230) Pulse Rate: 101 (09/17 0230)  Labs:  Recent Labs  04/27/13 1240  WBC 16.6*  HGB 9.3*  PLT 296  CREATININE 2.44*   Estimated Creatinine Clearance: 38.8 ml/min (by C-G formula based on Cr of 2.44).   Microbiology: Recent Results (from the past 720 hour(s))  MRSA PCR SCREENING     Status: None   Collection Time    04/18/13 11:07 AM      Result Value Range Status   MRSA by PCR NEGATIVE  NEGATIVE Final   Comment:            The GeneXpert MRSA Assay (FDA     approved for NASAL specimens     only), is one component of a     comprehensive MRSA colonization     surveillance program. It is not     intended to diagnose MRSA     infection nor to guide or     monitor treatment for     MRSA infections.  CULTURE, BLOOD (ROUTINE X 2)     Status: None   Collection Time    04/19/13 10:02 PM      Result Value Range Status   Specimen Description BLOOD A-LINE   Final   Special Requests BOTTLES DRAWN AEROBIC AND ANAEROBIC 5CC EA   Final   Culture  Setup Time     Final   Value: 04/20/2013 06:20     Performed at [facility]   Culture     Final   Value: NO GROWTH 5 DAYS     Performed at [facility]   Report Status 04/26/2013 FINAL   Final  CULTURE, BLOOD (ROUTINE X 2)     Status: None   Collection Time    04/19/13 11:40 PM      Result Value Range Status   Specimen Description BLOOD RIGHT HAND   Final   Special Requests BOTTLES DRAWN AEROBIC ONLY 5CC   Final   Culture  Setup Time     Final   Value: 04/20/2013 06:21     Performed at [facility]   Culture     Final   Value: NO GROWTH 5 DAYS     Performed at [facility]   Report Status 04/26/2013 FINAL   Final    Medical History: Past Medical History  Diagnosis Date  . HTN (hypertension)   . Ureteral stone   . Colon cancer   . Shortness of breath   . Asthma   . Chronic kidney disease     acuts on chronic renal failure  . Arthritis   . PE (pulmonary embolism) 04/2013    Medications:  Prescriptions prior to admission  Medication Sig Dispense Refill  . allopurinol (ZYLOPRIM) 300 MG tablet Take 300 mg by mouth daily.      Marland Kitchen ALPRAZolam (XANAX) 0.25 MG tablet Take 1 tablet (0.25 mg total) by mouth 3 (three) times daily as needed for anxiety.  21 tablet  0  . docusate sodium (COLACE) 100 MG capsule Take 100  mg by mouth 3 (three) times daily.      Marland Kitchen oxyCODONE (OXY IR/ROXICODONE) 5 MG immediate release tablet Take 5 mg by mouth every 4 (four) hours as needed for pain.      . Testosterone (ANDROGEL PUMP) 20.25 MG/ACT (1.62%) GEL Place 1 Act onto the skin daily.       Scheduled:  . sodium chloride  3 mL Intravenous Q12H  . testosterone  5 g Transdermal Daily   Infusions:  . sodium chloride      Assessment: 67yo male was discharged recently following stay for kidney procedure, PE, and renal hematoma, now c/o sudden SOB and weakness, to begin IV ABX for possible wound infection vs UTI.  Goal of Therapy:  Vancomycin trough 10-20  Plan:  Will give vancomycin 2000mg  IV x1 followed by 1000mg  IV Q12H based on dosing and labs from prior visit as well as Zosyn 3.375g IV Q8H although renal function has been unstable so will monitor CBC, Cx, CrCl, levels closely.  Vernard Gambles, PharmD, BCPS  04/30/2013,2:36 AM

## 2013-04-30 NOTE — Progress Notes (Signed)
Utilization review completed.  

## 2013-04-30 NOTE — Progress Notes (Addendum)
[facility] Progress Note [place] TEAM 1 - Stepdown/ICU TEAM   Jack [name] [id number] DOB: November 18, 1945 DOA: 04/30/2013 PCP: Simone Curia, MD  Admit HPI / Brief Narrative: 67 y.o. male with complicated medical history including a recent stay at [facility] for PE S/P TURP got bilateral renal stents for obstruction, IVC filter for his DVT (anemic so couldn't be anticoagulated). Patient was seen in the ED at [facility] where his work up demonstrated creatinine of 6.0, WBC of 37k, potassium of 5.8, sodium 127, tachycardia, and mild hypotension. VQ scan was repeated which was negative for PE. He was given kayexelate in the ED, 2L NS, and transferred to [facility].  Assessment/Plan:  Hypotension Continue volume resuscitation but monitor closely as patient could rapidly swing into volume overload - TTE to evaluate for possible severe right heart failure  Leukocytosis  WBC count climbing/markedly elevated today - remains on empiric antibiotic with no clear source of infection  Acute renal insufficiency crt was 1.85 at time of d/c 9/10 - nephrology following - ultrasound pending to rule out obstruction  Hyperkalemia Due to above - no acute indication for dialysis at present  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 - given recent severe complications related to bleeding anticoagulation is not felt to be wise at this time - consider IR consult for catheter directed thrombolysis once renal situation clarified further - d/c testosterone replacement as can increase risk of DVT  S/p transurethral resection of prostate with left ureteroscopic stone manipulation 04/13/13 complicated by perinephric hematoma with bilateral hydronephrosis underwent urgent cystoscopy 04/18/2013 with bilateral stent placement and bladder evacuation with suprapubic tube placement   Code Status: FULL Family Communication: Spoke with patient, wife, and daughter at bedside Disposition Plan:  SDU  Consultants: Nephrology Urology  Procedures: None  Antibiotics: Zosyn 9/16 >> Vancomycin 9/16 >>  DVT prophylaxis: None - IVC filter in place  HPI/Subjective: Patient is seen for a followup visit  Objective: Blood pressure 84/40, pulse 91, temperature 98.3 F (36.8 C), temperature source Oral, resp. rate 22, height 5\' 9"  (1.753 m), weight 124.3 kg (274 lb 0.5 oz), SpO2 100.00%.  Intake/Output Summary (Last 24 hours) at 04/30/13 1420 Last data filed at 04/30/13 1137  Gross per 24 hour  Intake   4880 ml  Output    450 ml  Net   4430 ml   Exam: Followup exam completed  Data Reviewed: Basic Metabolic Panel:  Recent Labs Lab 04/27/13 1240 04/30/13 0300  NA 137 126*  K 4.1 5.1  CL 100 90*  CO2 20 15*  GLUCOSE 126* 122*  BUN 42* 94*  CREATININE 2.44* 6.86*  CALCIUM 8.7 8.2*   Liver Function Tests:  Recent Labs Lab 04/30/13 0300  AST 20  ALT 15  ALKPHOS 98  BILITOT 0.5  PROT 6.4  ALBUMIN 2.7*   CBC:  Recent Labs Lab 04/27/13 1240 04/30/13 0300  WBC 16.6* 26.1*  HGB 9.3* 8.6*  HCT 28.7* 24.8*  MCV 87.5 83.8  PLT 296 159   BNP (last 3 results)  Recent Labs  04/18/13 1245 04/27/13 1240  PROBNP 1762.0* 1260.0*    Studies:  Recent x-ray studies have been reviewed in detail by the Attending Physician  Scheduled Meds:  Scheduled Meds: . piperacillin-tazobactam  2.25 g Intravenous Q8H  . sodium chloride  3 mL Intravenous Q12H  . testosterone  5 g Transdermal Daily    Time spent on care of this patient: 25+ mins   Nicki Furlan T  [facility] Office  [phone] Pager - Text Page per Loretha Stapler as per below:  On-Call/Text Page:      [email or web address]      password [email or web address]  If 7PM-7AM, please contact night-coverage [email or web address] Password [email or web address] 04/30/2013, 2:20 PM   LOS: 0 days

## 2013-04-30 NOTE — Progress Notes (Signed)
Echocardiogram 2D Echocardiogram has been performed.  Jack [name] 04/30/2013, 2:35 PM

## 2013-05-01 ENCOUNTER — Encounter (HOSPITAL_COMMUNITY): Payer: Self-pay | Admitting: *Deleted

## 2013-05-01 DIAGNOSIS — I2699 Other pulmonary embolism without acute cor pulmonale: Secondary | ICD-10-CM

## 2013-05-01 DIAGNOSIS — Z5189 Encounter for other specified aftercare: Secondary | ICD-10-CM

## 2013-05-01 LAB — RENAL FUNCTION PANEL
BUN: 102 mg/dL — ABNORMAL HIGH (ref 6–23)
CO2: 22 mEq/L (ref 19–32)
Chloride: 85 mEq/L — ABNORMAL LOW (ref 96–112)
Creatinine, Ser: 6.18 mg/dL — ABNORMAL HIGH (ref 0.50–1.35)
Glucose, Bld: 123 mg/dL — ABNORMAL HIGH (ref 70–99)

## 2013-05-01 LAB — CBC WITH DIFFERENTIAL/PLATELET
Eosinophils Absolute: 0 10*3/uL (ref 0.0–0.7)
Eosinophils Relative: 0 % (ref 0–5)
HCT: 20.7 % — ABNORMAL LOW (ref 39.0–52.0)
Hemoglobin: 7.3 g/dL — ABNORMAL LOW (ref 13.0–17.0)
Lymphs Abs: 0.7 10*3/uL (ref 0.7–4.0)
MCH: 29.2 pg (ref 26.0–34.0)
MCV: 82.8 fL (ref 78.0–100.0)
Monocytes Relative: 11 % (ref 3–12)
Neutrophils Relative %: 85 % — ABNORMAL HIGH (ref 43–77)
RBC: 2.5 MIL/uL — ABNORMAL LOW (ref 4.22–5.81)

## 2013-05-01 LAB — BASIC METABOLIC PANEL
BUN: 99 mg/dL — ABNORMAL HIGH (ref 6–23)
Chloride: 86 mEq/L — ABNORMAL LOW (ref 96–112)
Glucose, Bld: 115 mg/dL — ABNORMAL HIGH (ref 70–99)
Potassium: 3.9 mEq/L (ref 3.5–5.1)

## 2013-05-01 MED ORDER — SODIUM CHLORIDE 0.9 % IV BOLUS (SEPSIS)
500.0000 mL | Freq: Once | INTRAVENOUS | Status: AC
  Administered 2013-05-01: 500 mL via INTRAVENOUS

## 2013-05-01 MED ORDER — VANCOMYCIN HCL 10 G IV SOLR: 1500.0000 mg | INTRAVENOUS | Status: DC

## 2013-05-01 MED ORDER — VANCOMYCIN HCL 10 G IV SOLR
1500.0000 mg | INTRAVENOUS | Status: DC
  Administered 2013-05-01: 1500 mg via INTRAVENOUS
  Filled 2013-05-01: qty 1500

## 2013-05-01 NOTE — Progress Notes (Signed)
ANTIBIOTIC CONSULT NOTE - FOLLOW UP  Pharmacy Consult for vancomycin Indication: UTI vs wound infection  Labs:  Recent Labs  04/30/13 0300 04/30/13 1751 05/01/13 0014 05/01/13 0437  WBC 26.1*  --   --  15.9*  HGB 8.6*  --   --  7.3*  PLT 159  --   --  134*  LABCREA  --  147.00  --   --   CREATININE 6.86*  --  6.08* 6.18*   Estimated Creatinine Clearance: 16.1 ml/min (by C-G formula based on Cr of 6.18).  Recent Labs  05/01/13 0014  VANCORANDOM 27.7     Assessment/Plan: 67yo male with unstable renal function who had a random vanc level drawn last night which was elevated, but difficult to interpret d/t changing renal function. Received a dose of vancomycin 1500mg  IV this morning and frequecy currently entered as Q24hrs. SCr decreased slightly this morning, but is still quite elevated from admission. No plans for HD right now.   Goal of Therapy:  Vancomycin trough level 15-20 mcg/ml  Plan: 1. Change frequency of vancomycin to 1500mg  IV Q48- next dose would be due 9/20 AM. 2. Will check another random vanc level before 9/20 dose and reassess dosing at that time 3. Follow renal function and address dose changes sooner if needed- will also follow if patient goes to HD  Monty Mccarrell D. Braylei Totino, PharmD Clinical Pharmacist Pager: [phone] 05/01/2013 11:04 AM

## 2013-05-01 NOTE — Progress Notes (Signed)
[facility] Progress Note [place] TEAM 1 - Stepdown/ICU TEAM   Jack [name] ZOX:[id number] DOB: 12/27/1945 DOA: 04/30/2013 PCP: Simone Curia, MD  Admit HPI / Brief Narrative: 67 y.o. male with complicated medical history including a recent stay at [facility] for PE S/P TURP got bilateral renal stents for obstruction, IVC filter for his DVT (anemic so couldn't be anticoagulated). Patient was seen in the ED at [facility] where his work up demonstrated creatinine of 6.0, WBC of 37k, potassium of 5.8, sodium 127, tachycardia, and mild hypotension. VQ scan was repeated which was negative for PE. He was given kayexelate in the ED, 2L NS, and transferred to [facility].  Assessment/Plan:  Hypotension Continue volume resuscitation (per renal) but monitor closely as patient could rapidly swing into volume overload -  - ECHO reveals goo LV function- unable to assess RV due to poor visualization  Leukocytosis  WBC improved today to where it was on 9/14- remains on empiric antibiotic with no clear source of infection- I will stop Vancomycin today  Acute renal insufficiency crt was 1.85 at time of d/c 9/10 - nephrology following - ultrasound and Ct abd/pelvis reviewed and there are no new changed  Hyperkalemia Due to above - impoving  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 - given recent severe complications related to bleeding anticoagulation is not felt to be wise at this time - consider IR consult for catheter directed thrombolysis once renal situation clarified further - d/c testosterone replacement as can increase risk of DVT  S/p transurethral resection of prostate with left ureteroscopic stone manipulation 04/13/13 complicated by perinephric hematoma with bilateral hydronephrosis underwent urgent cystoscopy 04/18/2013 with bilateral stent placement and bladder evacuation with suprapubic tube placement   Code Status: FULL Family Communication: Spoke with patient, wife, and  daughter at bedside Disposition Plan: SDU- start PT  Consultants: Nephrology Urology  Procedures: None  Antibiotics: Zosyn 9/16 >> Vancomycin 9/16 >>  DVT prophylaxis: None - IVC filter in place  HPI/Subjective: Patient states he feels better. States both legs are numb from the day of admission- on exam he mentioned subjective numbness in both legs when compared with arms- when I re-checked his sensation 5 min later- he states it is the same as his arms. He states he cant walk because of clots in his legs- unable to lift left leg on exam "too heavy" . Wife at bedside.   Objective: Blood pressure 106/53, pulse 93, temperature 97.8 F (36.6 C), temperature source Oral, resp. rate 21, height 5\' 9"  (1.753 m), weight 135.6 kg (298 lb 15.1 oz), SpO2 100.00%.  Intake/Output Summary (Last 24 hours) at 05/01/13 1511 Last data filed at 05/01/13 1121  Gross per 24 hour  Intake 4074.67 ml  Output   1650 ml  Net 2424.67 ml   Exam: General: NAD, resting comfortably in bed  Eyes: PEERLA EOMI  Cardiovascular: RRR w/o MRG  Respiratory: CTA B  Abdomen: soft, nt, nd, bs+, suprapubic catheter in place, surgical wound with dressing in place  Skin: no rash nor lesion   Neurologic: AAOx3, unable to lift left leg but 5/5 strength at ankle - 4/5 in right leg- arms 5/5   Data Reviewed: Basic Metabolic Panel:  Recent Labs Lab 04/27/13 1240 04/30/13 0300 05/01/13 0014 05/01/13 0437  NA 137 126* 125* 126*  K 4.1 5.1 3.9 3.9  CL 100 90* 86* 85*  CO2 20 15* 20 22  GLUCOSE 126* 122* 115* 123*  BUN 42* 94* 99* 102*  CREATININE 2.44* 6.86* 6.08* 6.18*  CALCIUM 8.7 8.2* 7.1* 7.4*  PHOS  --   --   --  9.4*   Liver Function Tests:  Recent Labs Lab 04/30/13 0300 05/01/13 0437  AST 20  --   ALT 15  --   ALKPHOS 98  --   BILITOT 0.5  --   PROT 6.4  --   ALBUMIN 2.7* 2.2*   CBC:  Recent Labs Lab 04/27/13 1240 04/30/13 0300 05/01/13 0437  WBC 16.6* 26.1* 15.9*  NEUTROABS  --    --  13.5*  HGB 9.3* 8.6* 7.3*  HCT 28.7* 24.8* 20.7*  MCV 87.5 83.8 82.8  PLT 296 159 134*   BNP (last 3 results)  Recent Labs  04/18/13 1245 04/27/13 1240  PROBNP 1762.0* 1260.0*    Studies:  Recent x-ray studies have been reviewed in detail by the Attending Physician  Scheduled Meds:  Scheduled Meds: . piperacillin-tazobactam  2.25 g Intravenous Q8H  . sodium chloride  3 mL Intravenous Q12H    Time spent on care of this patient: 25+ mins   Calvert Cantor, MD  [facility] Office  [phone] Pager - Text Page per Loretha Stapler as per below:  On-Call/Text Page:      [email or web address]      password [email or web address]  If 7PM-7AM, please contact night-coverage [email or web address] Password [email or web address] 05/01/2013, 3:11 PM   LOS: 1 day

## 2013-05-01 NOTE — Progress Notes (Signed)
Patient ID: Jack [name], male   DOB: 1945-09-30, 67 y.o.   MRN: [id number]  S:  No acute events  O: CT and Renal US w/o hydro or large abdominal fluid collections UOP improving, but still very low  A/P:  1 - Acute Renal Failure - Likely pre-renal + instrinsic given lack of hydro and good position of current stents by imaging today.

## 2013-05-01 NOTE — Progress Notes (Signed)
ANTIBIOTIC CONSULT NOTE - FOLLOW UP  Pharmacy Consult for vancomycin Indication: UTI vs wound infection  Labs:  Recent Labs  04/30/13 0300 04/30/13 1751  WBC 26.1*  --   HGB 8.6*  --   PLT 159  --   LABCREA  --  147.00  CREATININE 6.86*  --    Estimated Creatinine Clearance: 13.8 ml/min (by C-G formula based on Cr of 6.86).  Recent Labs  05/01/13 0014  VANCORANDOM 27.7      Assessment/Plan: 67yo male with unstable renal function has random vanc level suggesting current appropriate dose of vanc should be 1500mg  IV Q24H for calculated trough of ~18; however, as on recent prior admission, renal function has changed quickly and dramatically so will require continued close monitoring.   Vernard Gambles, PharmD, BCPS  05/01/2013,1:05 AM

## 2013-05-01 NOTE — Care Management Note (Signed)
    Page 1 of 2   05/23/2013     11:08:35 AM   CARE MANAGEMENT NOTE 05/23/2013  Patient:  Jack [name], Jack [name]   Account Number:  [id number]  Date Initiated:  04/30/2013  Documentation initiated by:  Donn Pierini  Subjective/Objective Assessment:   Pt admitted with sepsis, and acute on chronic renal failure     Action/Plan:   PTA pt was at home with spouse- NCM to follow for d/c needs/planning   Anticipated DC Date:     Anticipated DC Plan:  SKILLED NURSING FACILITY  In-house referral  Clinical Social Worker      DC Associate Professor  CM consult      [facility] Choice  Resumption Of Svcs/PTA Provider   Choice offered to / List presented to:             [facility] agency  [facility]   Status of service:  Completed, signed off Medicare Important Message given?   (If response is "NO", the following Medicare IM given date fields will be blank) Date Medicare IM given:   Date Additional Medicare IM given:    Discharge Disposition:    Per UR Regulation:  Reviewed for med. necessity/level of care/duration of stay  If discussed at Long Length of Stay Meetings, dates discussed:   05/06/2013  05/08/2013  05/13/2013  05/15/2013  05/20/2013  05/22/2013    Comments:  10/10  0955 debbie Davier Tramell rn,bsn spoke w Britta Mccreedy adm coord for inpt rehab. pt's ins will not cover inpt rehab. sw consult has been placed for shrt term snf for rehab.received message to ck on copay for poss xarelto. have asked cm sec to ck on this.  05-12-13 11:45am Avie Arenas, RNBSN [phone] Bilateral DVT's.  IR thrombosing with [facility].  CM will continue to follow for [facility] or SNF discharge.  05/05/13- 1130- Donn Pierini RN, BSN [phone] Received word from Bonifay with CIR that pt's insurance would not approve CIR stay- spoke with pt and wife at bedside regarding insurance decision and d/c plans- per conversation- pt states that if needed he would think about maybe a 2 week stay at [facility] in [place]- if insurance  would pay for that- he states that he was active with [facility] and he could go back home with them if he had to. He reports that he has all needed DME at home -that includes- hospital bed, W/C, BSC, and shower chair. NCM to follow pt progress - d/c planning HH vs SNF option pending progress.   05/01/13- 1000- Donn Pierini RN, BSN [phone] Received call from [facility] of [facility]- to notify that pt was active with them for HH-PT- contact # [phone]-  pt will need resumption orders for [facility] if returns home at discharge- NCM to follow for d/c needs and planning

## 2013-05-01 NOTE — Progress Notes (Signed)
[facility] Progress Note    Assessment/ Plan:   1. Acute renal insufficiency: of unclear etiology, possibly prerenal given persistent hypotension/sepsis, but also must question postrenal as he has recently undergone instrumentation and stenting of bilateral ureters  - creatinine mildly improved today compared to yesterday - renal u/s shows mild R hydronephrosis, slight pelvicaliectasis on L but improved since prior study - FENa = 0.7%, suggests pre-renal cause - adequate UOP overnight - avoid nephrotoxic medications - may yet require dialysis if renal function does not improve, no indication for emergent dialysis at this time  - will continue to follow along with you   2. Hyperkalemia:  - improved to 5.1 s/p kayexalate which was given at [facility] - continue to monitor BMET closely   3. Hypotension: possibly related to heart strain from recent PE/NSTEMI, also could be related to extensive bilateral lower extremity DVT's causing low preload vs. sepsis - remained hypotensive overnight - echo unremarkable but poor quality study  4. Infection/sepsis workup & management per primary team   5. Bilateral lower extremity DVT's - has IVC filter in place. Anticoagulation decision making per primary team   Levert Feinstein, MD  Family Medicine PGY-2  Renal Service Resident   Subjective:   Pt reports feeling well today, better than yesterday. Is tolerating PO. Says urine is coming out well.   Objective:   BP 95/44  Pulse 73  Temp(Src) 98.4 F (36.9 C) (Oral)  Resp 18  Ht 5\' 9"  (1.753 m)  Wt 274 lb 0.5 oz (124.3 kg)  BMI 40.45 kg/m2  SpO2 100%  Intake/Output Summary (Last 24 hours) at 05/01/13 6213 Last data filed at 05/01/13 0600  Gross per 24 hour  Intake   3850 ml  Output   1300 ml  Net   2550 ml   Weight change:   Physical Exam: Gen:NAD CVS:RRR Resp:CTAB, NWOB YQM:VHQI, nontender ONG:EXBMW marked LE edema bilaterally Neuro: speech normal,  grossly nonfocal  Imaging: Ct Abdomen Pelvis Wo Contrast  04/30/2013   *RADIOLOGY REPORT*  Clinical Data:   Acute renal failure.  CT ABDOMEN AND PELVIS WITHOUT CONTRAST  Technique:  Multidetector CT imaging of the abdomen and pelvis was performed following the standard protocol without intravenous contrast.  Comparison: CT scan from 04/16/2013  Findings: Focal atelectasis or pneumonia is seen in the posterior left base.  No focal abnormalities seen in the liver or spleen on this study performed without intravenous contrast material.  The stomach is decompressed.  The duodenum, pancreas, gallbladder, and adrenal glands are unremarkable.  Stable appearance of the 7.8 cm cyst in the upper pole of the left kidney.  6 mm nonobstructing stone is seen in the lower pole of the left kidney.  The left sub capsular hematoma has decreased in the interval measuring about 1.7 cm in thickness today compared 2.4 cm previously.  The left double-J internal ureteral stent remains in place and is stable in position. There is been interval placement of a right double-J internal ureteral stent with the proximal loop formed and upper pole calix and the lower loop formed in the urinary bladder.  IVC filter again noted.  The no abdominal aortic aneurysm.  No free fluid or lymphadenopathy in the abdomen.  Imaging through the pelvis shows no apparent midline lower abdominal wound suggesting recent surgery.  Edema or inflammation is seen in the soft tissues of the extraperitoneal pelvic floor. Bilateral inguinal hernias contain only fat.  There is subcutaneous edema in the lower abdomen and  pelvis bilaterally.  No colonic diverticulitis.  No evidence for bowel obstruction.  The patient is status post right hemicolectomy.  Urinary bladder is decompressed by a suprapubic tube.  Bone windows reveal no worrisome lytic or sclerotic osseous lesions.  IMPRESSION: Interval decrease in size of the left renal subcapsular fluid collection, suggesting  resolving hematoma.  The patient has bilateral internal ureteral stents without overt hydronephrosis at this time.  There is some fullness of the right intrarenal collecting system which is stable to mildly increased in the interval.  Fullness of the left intrarenal collecting system seen previously has decreased in the interval.   Original Report Authenticated By: Kennith Center, M.D.   US Renal  04/30/2013   CLINICAL DATA:  Acute renal failure.  EXAM: RENAL/URINARY TRACT ULTRASOUND COMPLETE  COMPARISON:  04/18/2013  FINDINGS: Right Kidney  Length: 10.7 cm. Mild right hydronephrosis. Stent is visualized within the dilated renal pelvis. 1.4 cm cyst.  Left Kidney  Length: 14.0 cm. Slight pelvicaliectasis. Stent cannot be visualized.  7.1 cm cystic mass in the upper pole of the left kidney. This likely represents a benign cyst and is stable since prior study.  Bladder:  Cannot visualize.  IMPRESSION: Mild right hydronephrosis and slight pelvicaliectasis on the left. These findings have improved since prior study.  Bilateral renal cysts.   Electronically Signed   By: Charlett Nose M.D.   On: 04/30/2013 16:58    Labs: BMET  Recent Labs Lab 04/27/13 1240 04/30/13 0300 05/01/13 0014 05/01/13 0437  NA 137 126* 125* 126*  K 4.1 5.1 3.9 3.9  CL 100 90* 86* 85*  CO2 20 15* 20 22  GLUCOSE 126* 122* 115* 123*  BUN 42* 94* 99* 102*  CREATININE 2.44* 6.86* 6.08* 6.18*  CALCIUM 8.7 8.2* 7.1* 7.4*  PHOS  --   --   --  9.4*   CBC  Recent Labs Lab 04/27/13 1240 04/30/13 0300 05/01/13 0437  WBC 16.6* 26.1* 15.9*  NEUTROABS  --   --  13.5*  HGB 9.3* 8.6* 7.3*  HCT 28.7* 24.8* 20.7*  MCV 87.5 83.8 82.8  PLT 296 159 134*    Medications:    . piperacillin-tazobactam  2.25 g Intravenous Q8H  . sodium chloride  3 mL Intravenous Q12H  . vancomycin  1,500 mg Intravenous Q24H      [name], MD  Family Practice PGY-2 05/01/2013, 8:28 AM   I have seen and examined this patient and agree  with plan per Dr Pollie Meyer.  UO good and Scr down slightly.  FeNa suggest pre renal.  Acidosis better so will decrease bicarb gtt.  Suspect his hypotension is due to poor venous return due to DVT.  Recheck labs in AM.  He might benefit from a transfusion to help with preload as HG 7.3. Brystol Wasilewski T,MD 05/01/2013 10:04 AM

## 2013-05-02 LAB — RENAL FUNCTION PANEL
BUN: 97 mg/dL — ABNORMAL HIGH (ref 6–23)
Glucose, Bld: 123 mg/dL — ABNORMAL HIGH (ref 70–99)
Phosphorus: 7.9 mg/dL — ABNORMAL HIGH (ref 2.3–4.6)
Potassium: 3.5 mEq/L (ref 3.5–5.1)

## 2013-05-02 LAB — CBC WITH DIFFERENTIAL/PLATELET
Basophils Absolute: 0 10*3/uL (ref 0.0–0.1)
Eosinophils Relative: 1 % (ref 0–5)
HCT: 20.5 % — ABNORMAL LOW (ref 39.0–52.0)
Lymphocytes Relative: 8 % — ABNORMAL LOW (ref 12–46)
Lymphs Abs: 1 10*3/uL (ref 0.7–4.0)
MCV: 83.3 fL (ref 78.0–100.0)
Monocytes Absolute: 1.5 10*3/uL — ABNORMAL HIGH (ref 0.1–1.0)
Monocytes Relative: 12 % (ref 3–12)
RDW: 15.7 % — ABNORMAL HIGH (ref 11.5–15.5)
WBC: 13.3 10*3/uL — ABNORMAL HIGH (ref 4.0–10.5)

## 2013-05-02 MED ORDER — DOCUSATE SODIUM 100 MG PO CAPS: 100.0000 mg | ORAL_CAPSULE | Freq: Three times a day (TID) | ORAL | Status: DC

## 2013-05-02 MED ORDER — MIDODRINE HCL 5 MG PO TABS
10.0000 mg | ORAL_TABLET | Freq: Three times a day (TID) | ORAL | Status: DC
  Administered 2013-05-02 – 2013-05-13 (×31): 10 mg via ORAL
  Filled 2013-05-02 (×39): qty 2

## 2013-05-02 NOTE — Progress Notes (Signed)
[facility] Progress Note [place] TEAM 1 - Stepdown/ICU TEAM   Jack [name] ZOX:[id number] DOB: 17-Oct-1945 DOA: 04/30/2013 PCP: Simone Curia, MD  Admit HPI / Brief Narrative: 67 y.o. male with complicated medical history including a recent stay at [facility] for PE S/P TURP got bilateral renal stents for obstruction, IVC filter for DVT (anemic so couldn't be anticoagulated). Patient was seen in [facility] ED at [facility] where his work up demonstrated creatinine of 6.0, WBC of 37k, potassium of 5.8, sodium 127, tachycardia, and mild hypotension. VQ scan was repeated which was negative for PE. He was given kayexelate in the ED, 2L NS, and transferred to [facility].  Assessment/Plan:  Hypotension - persistent  Continue volume resuscitation but monitor closely as patient could rapidly swing into volume overload - ECHO reveals preserved LV function but  unable to assess RV due to poor visualization - transfuse 2U PRBC and follow   Progressive anemia  Likely a combination of significant recent acute loss plus decreased production - though patient does not appear to be bleeding at this time his hemoglobin is drifting down with hydration and in the setting of hypotension and acute renal failure I have chosen to transfuse him 2 units of packed red blood cells  Leukocytosis  WBC improved - on empiric antibiotic with no clear source of infection - consider d/c of abx after 5 days of tx  Acute renal insufficiency crt was 1.85 at time of d/c 9/10 - Nephrology following - ultrasound and Ct abd/pelvis reviewed and there are no acute findings   Hyperkalemia Due to above - resolved   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 - given recent severe complications related to bleeding anticoagulation is not felt to be wise at this time - consider IR consult for catheter directed thrombolysis once renal situation clarified further - d/c testosterone replacement as can increase risk of DVT  S/p  transurethral resection of prostate with left ureteroscopic stone manipulation 04/13/13 complicated by perinephric hematoma with bilateral hydronephrosis underwent urgent cystoscopy 04/18/2013 with bilateral stent placement and bladder evacuation with suprapubic tube placement - Urology is following   Code Status: FULL Family Communication: Spoke with patient and wife at bedside  Disposition Plan: SDU  Consultants: Nephrology Urology  Procedures: None  Antibiotics: Zosyn 9/16 >> Vancomycin 9/16 >>9/18  DVT prophylaxis: None - IVC filter in place  HPI/Subjective: Patient is alert and oriented.  He reports he feels better overall today.  He denies chest pain fevers chills nausea vomiting shortness of breath.  Objective: Blood pressure 82/34, pulse 75, temperature 99.1 F (37.3 C), temperature source Oral, resp. rate 23, height 5\' 9"  (1.753 m), weight 135.6 kg (298 lb 15.1 oz), SpO2 98.00%.  Intake/Output Summary (Last 24 hours) at 05/02/13 1632 Last data filed at 05/02/13 1336  Gross per 24 hour  Intake 2467.58 ml  Output   1850 ml  Net 617.58 ml   Exam: General: No acute respiratory distress Lungs: Clear to auscultation bilaterally without wheezes or crackles Cardiovascular: Regular rate and rhythm without murmur gallop or rub normal S1 and S2 Abdomen: nondistended, soft, bowel sounds positive, no rebound, no ascites, no appreciable mass - no flank ecchymosis - suprapubic tube insertion site clean and dry with no erythema or discharge Extremities: 3+ plus edema bilateral lower extremities to pelvis  Data Reviewed: Basic Metabolic Panel:  Recent Labs Lab 04/27/13 1240 04/30/13 0300 05/01/13 0014 05/01/13 0437 05/02/13 0325  NA 137 126* 125* 126* 129*  K  4.1 5.1 3.9 3.9 3.5  CL 100 90* 86* 85* 87*  CO2 20 15* 20 22 26   GLUCOSE 126* 122* 115* 123* 123*  BUN 42* 94* 99* 102* 97*  CREATININE 2.44* 6.86* 6.08* 6.18* 5.05*  CALCIUM 8.7 8.2* 7.1* 7.4* 7.1*  PHOS  --    --   --  9.4* 7.9*   Liver Function Tests:  Recent Labs Lab 04/30/13 0300 05/01/13 0437 05/02/13 0325  AST 20  --   --   ALT 15  --   --   ALKPHOS 98  --   --   BILITOT 0.5  --   --   PROT 6.4  --   --   ALBUMIN 2.7* 2.2* 2.1*   CBC:  Recent Labs Lab 04/27/13 1240 04/30/13 0300 05/01/13 0437 05/02/13 0325  WBC 16.6* 26.1* 15.9* 13.3*  NEUTROABS  --   --  13.5* 10.6*  HGB 9.3* 8.6* 7.3* 7.2*  HCT 28.7* 24.8* 20.7* 20.5*  MCV 87.5 83.8 82.8 83.3  PLT 296 159 134* 145*   BNP (last 3 results)  Recent Labs  04/18/13 1245 04/27/13 1240  PROBNP 1762.0* 1260.0*    Studies:  Recent x-ray studies have been reviewed in detail by the Attending Physician  Scheduled Meds:  Scheduled Meds: . midodrine  10 mg Oral TID WC  . piperacillin-tazobactam  2.25 g Intravenous Q8H  . sodium chloride  3 mL Intravenous Q12H    Time spent on care of this patient: 35 mins   MCCLUNG,JEFFREY T, MD  [facility] Office  [phone] Pager - Text Page per Loretha Stapler as per below:  On-Call/Text Page:      [email or web address]      password [email or web address]  If 7PM-7AM, please contact night-coverage [email or web address] Password [email or web address] 05/02/2013, 4:32 PM   LOS: 2 days

## 2013-05-02 NOTE — Progress Notes (Signed)
Nutrition Brief Note  Patient identified on the Malnutrition Screening Tool (MST) Report for eating poorly because of a decreased appetite.  Patient reports a good appetite; consumed 100% of breakfast this AM.  Wt Readings from Last 15 Encounters:  05/01/13 298 lb 15.1 oz (135.6 kg)  04/23/13 274 lb (124.286 kg)  04/23/13 274 lb (124.286 kg)    Body mass index is 44.13 kg/(m^2). Patient meets criteria for Obesity Class III based on current BMI.   Current diet order is Renal 80/90-09-15-1198 ml. Labs and medications reviewed.   No nutrition interventions warranted at this time. If nutrition issues arise, please consult RD.   RD added Dysphagia 3 comment to diet order as patient has trouble chewing.  Maureen Chatters, RD, LDN Pager #: [phone] After-Hours Pager #: [phone]

## 2013-05-02 NOTE — Progress Notes (Signed)
Rehab Admissions Coordinator Note:  Patient was screened by Brock Ra for appropriateness for an Inpatient Acute Rehab Consult.  At this time, we are recommending Inpatient Rehab consult.  Brock Ra 05/02/2013, 4:26 PM  I can be reached at [phone].

## 2013-05-02 NOTE — Progress Notes (Signed)
[facility] Progress Note    Assessment/ Plan:   1. Acute renal insufficiency: of unclear etiology, possibly prerenal given persistent hypotension/sepsis, but also must question postrenal as he has recently undergone instrumentation and stenting of bilateral ureters  - creatinine continues to improve - renal u/s shows mild R hydronephrosis, slight pelvicaliectasis on L but improved since prior study - FENa = 0.7%, suggests pre-renal cause - adequate UOP thus far - avoid nephrotoxic medications - may yet require dialysis if renal function significantly worsens, no indication for emergent dialysis at this time  - will continue to follow along with you   2. Hyperkalemia:  - improved to 5.1 s/p kayexalate which was [facility] at [facility] - continue to monitor BMET closely   3. Hypotension: possibly related to heart strain from recent PE/NSTEMI, also could be related to extensive bilateral lower extremity DVT's causing low preload vs. sepsis - remained hypotensive overnight - echo unremarkable but poor quality study  4. Infection/sepsis workup & management per primary team   5. Bilateral lower extremity DVT's - has IVC filter in place. Anticoagulation decision making per primary team   Levert Feinstein, MD  Family Medicine PGY-2  Renal Service Resident   Subjective:   Pt reports feeling well today, has no complaints. Eating breakfast.   Objective:   BP 101/46  Pulse 72  Temp(Src) 98.4 F (36.9 C) (Oral)  Resp 19  Ht 5\' 9"  (1.753 m)  Wt 298 lb 15.1 oz (135.6 kg)  BMI 44.13 kg/m2  SpO2 98%  Intake/Output Summary (Last 24 hours) at 05/02/13 4540 Last data filed at 05/02/13 0813  Gross per 24 hour  Intake 2927.25 ml  Output   1525 ml  Net 1402.25 ml   Weight change:   Physical Exam: Gen:NAD CVS:RRR Resp:CTAB, NWOB JWJ:XBJY, nontender to palpation Ext: marked LE edema bilaterally remains Neuro: speech normal, grossly nonfocal  Imaging: Ct  Abdomen Pelvis Wo Contrast  04/30/2013   *RADIOLOGY REPORT*  Clinical Data:   Acute renal failure.  CT ABDOMEN AND PELVIS WITHOUT CONTRAST  Technique:  Multidetector CT imaging of the abdomen and pelvis was performed following the standard protocol without intravenous contrast.  Comparison: CT scan from 04/16/2013  Findings: Focal atelectasis or pneumonia is seen in the posterior left base.  No focal abnormalities seen in the liver or spleen on this study performed without intravenous contrast material.  The stomach is decompressed.  The duodenum, pancreas, gallbladder, and adrenal glands are unremarkable.  Stable appearance of the 7.8 cm cyst in the upper pole of the left kidney.  6 mm nonobstructing stone is seen in the lower pole of the left kidney.  The left sub capsular hematoma has decreased in the interval measuring about 1.7 cm in thickness today compared 2.4 cm previously.  The left double-J internal ureteral stent remains in place and is stable in position. There is been interval placement of a right double-J internal ureteral stent with the proximal loop formed and upper pole calix and the lower loop formed in the urinary bladder.  IVC filter again noted.  The no abdominal aortic aneurysm.  No free fluid or lymphadenopathy in the abdomen.  Imaging through the pelvis shows no apparent midline lower abdominal wound suggesting recent surgery.  Edema or inflammation is seen in the soft tissues of the extraperitoneal pelvic floor. Bilateral inguinal hernias contain only fat.  There is subcutaneous edema in the lower abdomen and pelvis bilaterally.  No colonic diverticulitis.  No evidence for bowel  obstruction.  The patient is status post right hemicolectomy.  Urinary bladder is decompressed by a suprapubic tube.  Bone windows reveal no worrisome lytic or sclerotic osseous lesions.  IMPRESSION: Interval decrease in size of the left renal subcapsular fluid collection, suggesting resolving hematoma.  The patient  has bilateral internal ureteral stents without overt hydronephrosis at this time.  There is some fullness of the right intrarenal collecting system which is stable to mildly increased in the interval.  Fullness of the left intrarenal collecting system seen previously has decreased in the interval.   Original Report Authenticated By: Kennith Center, M.D.   US Renal  04/30/2013   CLINICAL DATA:  Acute renal failure.  EXAM: RENAL/URINARY TRACT ULTRASOUND COMPLETE  COMPARISON:  04/18/2013  FINDINGS: Right Kidney  Length: 10.7 cm. Mild right hydronephrosis. Stent is visualized within the dilated renal pelvis. 1.4 cm cyst.  Left Kidney  Length: 14.0 cm. Slight pelvicaliectasis. Stent cannot be visualized.  7.1 cm cystic mass in the upper pole of the left kidney. This likely represents a benign cyst and is stable since prior study.  Bladder:  Cannot visualize.  IMPRESSION: Mild right hydronephrosis and slight pelvicaliectasis on the left. These findings have improved since prior study.  Bilateral renal cysts.   Electronically Signed   By: Charlett Nose M.D.   On: 04/30/2013 16:58    Labs: BMET  Recent Labs Lab 04/27/13 1240 04/30/13 0300 05/01/13 0014 05/01/13 0437 05/02/13 0325  NA 137 126* 125* 126* 129*  K 4.1 5.1 3.9 3.9 3.5  CL 100 90* 86* 85* 87*  CO2 20 15* 20 22 26   GLUCOSE 126* 122* 115* 123* 123*  BUN 42* 94* 99* 102* 97*  CREATININE 2.44* 6.86* 6.08* 6.18* 5.05*  CALCIUM 8.7 8.2* 7.1* 7.4* 7.1*  PHOS  --   --   --  9.4* 7.9*   CBC  Recent Labs Lab 04/27/13 1240 04/30/13 0300 05/01/13 0437 05/02/13 0325  WBC 16.6* 26.1* 15.9* 13.3*  NEUTROABS  --   --  13.5* 10.6*  HGB 9.3* 8.6* 7.3* 7.2*  HCT 28.7* 24.8* 20.7* 20.5*  MCV 87.5 83.8 82.8 83.3  PLT 296 159 134* 145*    Medications:    . piperacillin-tazobactam  2.25 g Intravenous Q8H  . sodium chloride  3 mL Intravenous Q12H      [name], MD  Family Practice PGY-2 05/02/2013, 8:26 AM  I have seen and  examined this patient and agree with plan  Per Dr Pollie Meyer.  UO good and renal fx improving.  Will DC IV bicarb.  Try midodrine to see if will help with BP.  I would recommend transfusion due to low BP and HG 7.2.  Recheck labs in AM. Chanti Golubski T,MD 05/02/2013 10:09 AM

## 2013-05-02 NOTE — Progress Notes (Addendum)
Physical Therapy Evaluation Patient Details Name: Jack [name] MRN: [id number] DOB: 1946-07-31 Today's Date: 05/02/2013 Time: 3086-5784 PT Time Calculation (min): 29 min  PT Assessment / Plan / Recommendation History of Present Illness  22 M underwent TURP and L ureteral stent 8/29. Admitted to [facility] 9/02 with acute PE and anticoagulation initiated. Hospitalization c/b hematuria, progressive rean linsufficiency and anemia requiring transfusion. Bladder irrigations initiated and CT scan abdomen performed 9/03 revealed L renal fluid subcapsular fluid collection thought to represent hematoma. Transferred to [facility] 9/05 for impending need for HD and placement of IVC filter. Currently has a suprapubic catheter with CBI.  Readmit to [facility] 04/25/13 due to sepsis and hypotension.    Clinical Impression  Pt admitted with above. Pt currently with functional limitations due to the deficits listed below (see PT Problem List). Will need Rehab to reach prior functional level.   Pt will benefit from skilled PT to increase their independence and safety with mobility to allow discharge to the venue listed below.     PT Assessment  Patient needs continued PT services    Follow Up Recommendations  CIR;Supervision/Assistance - 24 hour                Equipment Recommendations  None recommended by PT    Recommendations for Other Services Rehab consult   Frequency Min 3X/week    Precautions / Restrictions Precautions Precautions: Fall Restrictions Weight Bearing Restrictions: No   Pertinent Vitals/Pain VSS, some pain in bil LEs per pt with edema present      Mobility  Bed Mobility Bed Mobility: Rolling Right;Right Sidelying to Sit;Sitting - Scoot to Edge of Bed Rolling Right: 3: Mod assist Right Sidelying to Sit: 3: Mod assist Sitting - Scoot to Edge of Bed: 3: Mod assist Details for Bed Mobility Assistance: Pt needed assist for elevation of trunk.  Stated he needed to use the bathroom.  Was  going to get pt on 3N1 but once he stood he felt too weak to pivot to 3N1.   Transfers Transfers: Sit to Stand;Stand to Sit;Stand Pivot Transfers Sit to Stand: 3: Mod assist;With upper extremity assist;From bed;From elevated surface Stand to Sit: 3: Mod assist;With upper extremity assist;To bed Stand Pivot Transfers: 4: Min assist Details for Transfer Assistance: Pt needed cues for hand placement with right hand on bed and left pulled up on RW.  PT placed bed panunder pt on bed and pt had BM.  Then pt stood again and nursing cleaned pt.  Pt decided he could pivot to recliner and took a few steps around to recliner with steadying assist and cues.   Ambulation/Gait Ambulation/Gait Assistance: Not tested (comment) Stairs: No Wheelchair Mobility Wheelchair Mobility: No         PT Diagnosis: Difficulty walking  PT Problem List: Decreased strength;Decreased mobility;Decreased knowledge of use of DME PT Treatment Interventions: DME instruction;Gait training;Stair training;Functional mobility training;Therapeutic activities;Therapeutic exercise;Patient/family education     PT Goals(Current goals can be found in the care plan section) Acute Rehab PT Goals Patient Stated Goal: to go home PT Goal Formulation: With patient Time For Goal Achievement: 05/16/13 Potential to Achieve Goals: Good  Visit Information  Last PT Received On: 05/02/13 Assistance Needed: +2 (for ambulation) History of Present Illness: 62 M underwent TURP and L ureteral stent 8/29. Admitted to [facility] 9/02 with acute PE and anticoagulation initiated. Hospitalization c/b hematuria, progressive rean linsufficiency and anemia requiring transfusion. Bladder irrigations initiated and CT scan abdomen performed 9/03 revealed L renal  fluid subcapsular fluid collection thought to represent hematoma. Transferred to [facility] 9/05 for impending need for HD and placement of IVC filter. Currently has a suprapubic catheter with CBI.          Prior Functioning  Home Living Family/patient expects to be discharged to:: Private residence Living Arrangements: Spouse/significant other;Children Available Help at Discharge: Family;Available 24 hours/day Type of Home: Mobile home Home Access: Ramped entrance Home Layout: Multi-level Alternate Level Stairs-Number of Steps: 1 step to bedroom Alternate Level Stairs-Rails: None Home Equipment: Bedside commode;Walker - 2 wheels;Hand held shower head;Hospital bed (walking sticks) Prior Function Level of Independence: Independent with assistive device(s) Communication Communication: No difficulties Dominant Hand: Right    Cognition  Cognition Arousal/Alertness: Awake/alert Behavior During Therapy: WFL for tasks assessed/performed Overall Cognitive Status: Within Functional Limits for tasks assessed    Extremity/Trunk Assessment Upper Extremity Assessment Upper Extremity Assessment: Defer to OT evaluation Lower Extremity Assessment Lower Extremity Assessment: RLE deficits/detail;LLE deficits/detail RLE Deficits / Details: grossly 3-/5, edema RLE Sensation: decreased light touch LLE Deficits / Details: grossly 3-/5 with edema LLE Sensation: decreased light touch   Balance Balance Balance Assessed: Yes Static Standing Balance Static Standing - Balance Support: Bilateral upper extremity supported;During functional activity Static Standing - Level of Assistance: 4: Min assist Static Standing - Comment/# of Minutes: 3  End of Session PT - End of Session Equipment Utilized During Treatment: Gait belt;Oxygen Activity Tolerance: Patient limited by fatigue Patient left: in chair;with call bell/phone within reach;with family/visitor present Nurse Communication: Mobility status;Need for lift equipment       [name],Jack Kintzel 05/02/2013, 3:51 PM [facility] Acute Rehabilitation [phone] (pager)

## 2013-05-03 LAB — CBC
HCT: 26 % — ABNORMAL LOW (ref 39.0–52.0)
MCV: 84.4 fL (ref 78.0–100.0)
RBC: 3.08 MIL/uL — ABNORMAL LOW (ref 4.22–5.81)
WBC: 12 10*3/uL — ABNORMAL HIGH (ref 4.0–10.5)

## 2013-05-03 LAB — RENAL FUNCTION PANEL
BUN: 86 mg/dL — ABNORMAL HIGH (ref 6–23)
CO2: 24 mEq/L (ref 19–32)
Chloride: 86 mEq/L — ABNORMAL LOW (ref 96–112)
Creatinine, Ser: 3.8 mg/dL — ABNORMAL HIGH (ref 0.50–1.35)
GFR calc non Af Amer: 15 mL/min — ABNORMAL LOW (ref >90)

## 2013-05-03 MED ORDER — POTASSIUM CHLORIDE CRYS ER 20 MEQ PO TBCR
30.0000 meq | EXTENDED_RELEASE_TABLET | Freq: Once | ORAL | Status: AC
  Administered 2013-05-03: 11:00:00 30 meq via ORAL
  Filled 2013-05-03: qty 1

## 2013-05-03 MED ORDER — PIPERACILLIN-TAZOBACTAM 3.375 G IVPB
3.3750 g | Freq: Three times a day (TID) | INTRAVENOUS | Status: AC
  Administered 2013-05-03 (×2): 3.375 g via INTRAVENOUS
  Filled 2013-05-03 (×3): qty 50

## 2013-05-03 NOTE — Progress Notes (Signed)
ANTIBIOTIC CONSULT NOTE - FOLLOW UP  Pharmacy Consult for zosyn Indication: UTI vs wound infection  Labs:  Recent Labs  04/30/13 1751  05/01/13 0437 05/02/13 0325 05/03/13 0530  WBC  --   --  15.9* 13.3* 12.0*  HGB  --   --  7.3* 7.2* 9.0*  PLT  --   --  134* 145* 163  LABCREA 147.00  --   --   --   --   CREATININE  --   < > 6.18* 5.05* 3.80*  < > = values in this interval not displayed. Estimated Creatinine Clearance: 26.2 ml/min (by C-G formula based on Cr of 3.8).  Recent Labs  05/01/13 0014  VANCORANDOM 27.7     Assessment/Plan: 67yo male with unstable renal function who had a random vanc level drawn last night which was elevated, but difficult to interpret d/t changing renal function. Now on Zosyn for infection of unclear source. Renal function continues to improve.   Plan: 1.Change to Zosyn 3.375gm Q8H due to improved renal function 2. Monitor renal function, C&S, clinical progress 3. Follow-up length of therapy  Thank you, Piedad Climes, PharmD Clinical Pharmacist - Resident Pager: [phone] Pharmacy: [phone] 05/03/2013 12:19 PM

## 2013-05-03 NOTE — Progress Notes (Signed)
[name] Progress Note    Assessment/ Plan:   1. Acute renal insufficiency: of unclear etiology, possibly prerenal given persistent hypotension/sepsis, but also must question postrenal as he has recently undergone instrumentation and stenting of bilateral ureters  - creatinine continues to improve - renal u/s shows mild R hydronephrosis, slight pelvicaliectasis on L but improved since prior study - FENa = 0.7%, suggests pre-renal cause - continues with excellent UOP - avoid nephrotoxic medications - may yet require dialysis if renal function significantly worsens, no indication for emergent dialysis at this time  - will continue to follow along with you   2. Hyperkalemia: resolved, now hypokalemic at 3.1 - will give dose of KDur today - continue to monitor BMET closely   3. Hypotension: possibly related to heart strain from recent PE/NSTEMI, also could be related to extensive bilateral lower extremity DVT's causing low preload vs. sepsis - BP's improved after administration of blood & midodrine - echo unremarkable but poor quality study  4. Infection/sepsis workup & management per primary team, WBC continues to downtrend  5. Bilateral lower extremity DVT's - has IVC filter in place. Anticoagulation decision making per primary team   Jack Feinstein, MD  Family Medicine PGY-2  Renal Service Resident   Subjective:   Pt reports feeling well today, urinating well. He is deconditioned and said it took a lot for him to get off bedside commode.   Objective:   BP 106/49  Pulse 52  Temp(Src) 97.7 F (36.5 C) (Oral)  Resp 19  Ht 5\' 9"  (1.753 m)  Wt 298 lb 15.1 oz (135.6 kg)  BMI 44.13 kg/m2  SpO2 97%  Intake/Output Summary (Last 24 hours) at 05/03/13 0926 Last data filed at 05/03/13 0300  Gross per 24 hour  Intake    895 ml  Output   1775 ml  Net   -880 ml   Weight change:   Physical Exam: Gen:NAD CVS:RRR Resp:CTAB, mild increased WOB since  just transferred to bed from bedside commode [id number], nontender to palpation, +BS Ext: marked LE edema bilaterally remains Neuro: speech normal, grossly nonfocal  Imaging: No results found.  Labs: BMET  Recent Labs Lab 04/27/13 1240 04/30/13 0300 05/01/13 0014 05/01/13 0437 05/02/13 0325 05/03/13 0530  NA 137 126* 125* 126* 129* 126*  K 4.1 5.1 3.9 3.9 3.5 3.1*  CL 100 90* 86* 85* 87* 86*  CO2 20 15* 20 22 26 24   GLUCOSE 126* 122* 115* 123* 123* 115*  BUN 42* 94* 99* 102* 97* 86*  CREATININE 2.44* 6.86* 6.08* 6.18* 5.05* 3.80*  CALCIUM 8.7 8.2* 7.1* 7.4* 7.1* 7.4*  PHOS  --   --   --  9.4* 7.9* 5.4*   CBC  Recent Labs Lab 04/30/13 0300 05/01/13 0437 05/02/13 0325 05/03/13 0530  WBC 26.1* 15.9* 13.3* 12.0*  NEUTROABS  --  13.5* 10.6*  --   HGB 8.6* 7.3* 7.2* 9.0*  HCT 24.8* 20.7* 20.5* 26.0*  MCV 83.8 82.8 83.3 84.4  PLT 159 134* 145* 163    Medications:    . midodrine  10 mg Oral TID WC  . piperacillin-tazobactam  2.25 g Intravenous Q8H  . sodium chloride  3 mL Intravenous Q12H      Jack Dodrill, MD  Family Practice PGY-2 05/03/2013, 9:26 AM  I have seen and examined this patient and agree with plan per Dr Pollie Meyer.  UO excellent and renal fx cont to improve.  Trial of midodrine to see if helps with  BP.  Replace K.  Recheck Scr in AM Jack Dabbs T,MD 05/03/2013 9:41 AM

## 2013-05-03 NOTE — Progress Notes (Signed)
[facility] Progress Note Jack TEAM 1 - Stepdown/ICU TEAM   Jack [name] [name] DOB: [birth year] DOA: 04/30/2013 PCP: Simone Curia, MD  Admit HPI / Brief Narrative: 67 y.o. male with complicated medical history including a recent stay at [facility] for PE S/P TURP who required subsequent bilateral renal stents for obstruction due to hemorrhagic complications of anticoagulation s/p eventual IVC filter. Patient was seen in the ED at [facility] 9/17 where his work up demonstrated creatinine of 6.0, WBC of 37k, potassium of 5.8, sodium 127, tachycardia, and mild hypotension. VQ scan was repeated which was negative for PE. He was given kayexelate in the ED, 2L NS, and transferred to [facility].  Assessment/Plan:  Hypotension - persistent  Appears to be euvolemic at this time - ECHO revealed preserved LV function but unable to assess RV due to poor visualization - transfused 2U PRBC  Progressive anemia  Likely a combination of significant recent acute loss plus decreased production - the patient has been transfused 2 units packed red blood cells - follow hemoglobin trend  Leukocytosis  WBC improved - on empiric antibiotic with no clear source of infection - d/c abx after 5 days of tx  Acute renal insufficiency crt was 1.85 at time of d/c 9/10 - Nephrology following - ultrasound and Ct abd/pelvis reviewed and there are no acute findings - renal function improving continuous  Hyperkalemia >> hypokalemia Due to above - being replaced per Nephrology  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 - given recent severe complications related to bleeding anticoagulation is not felt to be wise at this time - further review of records reveals Dopplers from 9/7 at which time there were actually no DVTs appreciated - I will repeat dopplers now to quantify what clinically appears to be significant bilateral DVTs - d/c testosterone replacement as can increase risk of DVT  S/p  transurethral resection of prostate with left ureteroscopic stone manipulation 04/13/13 complicated by perinephric hematoma with bilateral hydronephrosis underwent urgent cystoscopy 04/18/2013 with bilateral stent placement and bladder evacuation with suprapubic tube placement - Urology is following   Code Status: FULL Family Communication: Spoke with patient at bedside  Disposition Plan: SDU  Consultants: Nephrology Urology  Procedures: None  Antibiotics: Zosyn 9/16 >> 9/20 Vancomycin 9/16 >>9/18  DVT prophylaxis: None - IVC filter in place  HPI/Subjective: Patient is alert and oriented.  He denies chest pain fevers chills nausea vomiting shortness of breath.  Objective: Blood pressure 106/49, pulse 52, temperature 97.8 F (36.6 C), temperature source Oral, resp. rate 19, height 5\' 9"  (1.753 m), weight 135.6 kg (298 lb 15.1 oz), SpO2 97.00%.  Intake/Output Summary (Last 24 hours) at 05/03/13 1410 Last data filed at 05/03/13 0300  Gross per 24 hour  Intake    655 ml  Output   1225 ml  Net   -570 ml   Exam: General: No acute respiratory distress Lungs: Clear to auscultation bilaterally without wheezes or crackles Cardiovascular: Regular rate and rhythm without murmur gallop or rub  Abdomen: Obese, nondistended, soft, bowel sounds positive, no rebound, no ascites, no appreciable mass - no flank ecchymosis - suprapubic tube insertion site clean and dry with no erythema or discharge Extremities: 3+ plus edema bilateral lower extremities to pelvis  Data Reviewed: Basic Metabolic Panel:  Recent Labs Lab 04/30/13 0300 05/01/13 0014 05/01/13 0437 05/02/13 0325 05/03/13 0530  NA 126* 125* 126* 129* 126*  K 5.1 3.9 3.9 3.5 3.1*  CL 90* 86* 85* 87* 86*  CO2 15* 20 22 26 24   GLUCOSE 122* 115* 123* 123* 115*  BUN 94* 99* 102* 97* 86*  CREATININE 6.86* 6.08* 6.18* 5.05* 3.80*  CALCIUM 8.2* 7.1* 7.4* 7.1* 7.4*  PHOS  --   --  9.4* 7.9* 5.4*   Liver Function  Tests:  Recent Labs Lab 04/30/13 0300 05/01/13 0437 05/02/13 0325 05/03/13 0530  AST 20  --   --   --   ALT 15  --   --   --   ALKPHOS 98  --   --   --   BILITOT 0.5  --   --   --   PROT 6.4  --   --   --   ALBUMIN 2.7* 2.2* 2.1* 2.0*   CBC:  Recent Labs Lab 04/27/13 1240 04/30/13 0300 05/01/13 0437 05/02/13 0325 05/03/13 0530  WBC 16.6* 26.1* 15.9* 13.3* 12.0*  NEUTROABS  --   --  13.5* 10.6*  --   HGB 9.3* 8.6* 7.3* 7.2* 9.0*  HCT 28.7* 24.8* 20.7* 20.5* 26.0*  MCV 87.5 83.8 82.8 83.3 84.4  PLT 296 159 134* 145* 163   BNP (last 3 results)  Recent Labs  04/18/13 1245 04/27/13 1240  PROBNP 1762.0* 1260.0*    Studies:  Recent x-ray studies have been reviewed in detail by the Attending Physician  Scheduled Meds:  Scheduled Meds: . midodrine  10 mg Oral TID WC  . piperacillin-tazobactam (ZOSYN)  IV  3.375 g Intravenous Q8H  . sodium chloride  3 mL Intravenous Q12H    Time spent on care of this patient: 35 mins   MCCLUNG,JEFFREY T, MD  [facility] Office  [phone] Pager - Text Page per Loretha Stapler as per below:  On-Call/Text Page:      [email or web address]      password [email or web address]  If 7PM-7AM, please contact night-coverage [email or web address] Password [email or web address] 05/03/2013, 2:10 PM   LOS: 3 days

## 2013-05-04 DIAGNOSIS — M7989 Other specified soft tissue disorders: Secondary | ICD-10-CM

## 2013-05-04 LAB — CBC
MCV: 85.2 fL (ref 78.0–100.0)
Platelets: 180 10*3/uL (ref 150–400)
RBC: 3.11 MIL/uL — ABNORMAL LOW (ref 4.22–5.81)
WBC: 9.1 10*3/uL (ref 4.0–10.5)

## 2013-05-04 LAB — RENAL FUNCTION PANEL
Albumin: 2.1 g/dL — ABNORMAL LOW (ref 3.5–5.2)
BUN: 68 mg/dL — ABNORMAL HIGH (ref 6–23)
Chloride: 90 mEq/L — ABNORMAL LOW (ref 96–112)
GFR calc Af Amer: 26 mL/min — ABNORMAL LOW (ref >90)
Glucose, Bld: 115 mg/dL — ABNORMAL HIGH (ref 70–99)
Potassium: 3.2 mEq/L — ABNORMAL LOW (ref 3.5–5.1)
Sodium: 129 mEq/L — ABNORMAL LOW (ref 135–145)

## 2013-05-04 MED ORDER — POTASSIUM CHLORIDE CRYS ER 20 MEQ PO TBCR
30.0000 meq | EXTENDED_RELEASE_TABLET | Freq: Once | ORAL | Status: AC
  Administered 2013-05-04: 30 meq via ORAL
  Filled 2013-05-04: qty 1

## 2013-05-04 NOTE — Progress Notes (Signed)
  [facility] Progress Note    Assessment/ Plan:   1. Acute renal insufficiency: of unclear etiology, possibly prerenal given persistent hypotension/sepsis, but also must question postrenal as he has recently undergone instrumentation and stenting of bilateral ureters  - creatinine continues to improve, now down to 2.78 - renal u/s shows mild R hydronephrosis, slight pelvicaliectasis on L but improved since prior study - FENa = 0.7%, suggests pre-renal cause - continues with excellent UOP - avoid nephrotoxic medications  - will continue to follow along with you   2. Hypokalemia: 3.3 s/p replacement yesterday - will give dose of KDur today - continue to monitor BMET closely   3. Hypotension: possibly related to heart strain from recent PE/NSTEMI, also could be related to extensive bilateral lower extremity DVT's causing low preload vs. sepsis - BP's slightly improved after administration of blood & midodrine - echo unremarkable but poor quality study  4. Infection/sepsis workup & management per primary team, WBC continues to downtrend  5. Bilateral lower extremity DVT's - has IVC filter in place. Anticoagulation decision making per primary team     Subjective:   Doing well. No complaints this morning.   Objective:   BP 113/47  Pulse 70  Temp(Src) 97.6 F (36.4 C) (Oral)  Resp 18  Ht 5\' 9"  (1.753 m)  Wt 298 lb 15.1 oz (135.6 kg)  BMI 44.13 kg/m2  SpO2 100%  Intake/Output Summary (Last 24 hours) at 05/04/13 0908 Last data filed at 05/04/13 0700  Gross per 24 hour  Intake    960 ml  Output   3175 ml  Net  -2215 ml   Weight change:   Physical Exam: Gen: NAD, resting in bed CVS: rrr, no mrg appreciated Resp: CTAB, no wheezes or crackles appreciated Abd: soft, nontender to palpation, +BS  Suprapubic catheter Ext: marked LE edema bilaterally remains   Imaging: No results found.  Labs: BMET  Recent Labs Lab 04/27/13 1240 04/30/13 0300  05/01/13 0014 05/01/13 0437 05/02/13 0325 05/03/13 0530 05/04/13 0533  NA 137 126* 125* 126* 129* 126* 129*  K 4.1 5.1 3.9 3.9 3.5 3.1* 3.2*  CL 100 90* 86* 85* 87* 86* 90*  CO2 20 15* 20 22 26 24 28   GLUCOSE 126* 122* 115* 123* 123* 115* 115*  BUN 42* 94* 99* 102* 97* 86* 68*  CREATININE 2.44* 6.86* 6.08* 6.18* 5.05* 3.80* 2.78*  CALCIUM 8.7 8.2* 7.1* 7.4* 7.1* 7.4* 7.9*  PHOS  --   --   --  9.4* 7.9* 5.4* 3.8   CBC  Recent Labs Lab 05/01/13 0437 05/02/13 0325 05/03/13 0530 05/04/13 0533  WBC 15.9* 13.3* 12.0* 9.1  NEUTROABS 13.5* 10.6*  --   --   HGB 7.3* 7.2* 9.0* 9.0*  HCT 20.7* 20.5* 26.0* 26.5*  MCV 82.8 83.3 84.4 85.2  PLT 134* 145* 163 180    Medications:    . midodrine  10 mg Oral TID WC  . potassium chloride  30 mEq Oral Once  . sodium chloride  3 mL Intravenous Q12H      Glori Luis, MD  Nephrology Service PGY-2 05/04/2013, 9:08 AM  I have seen and examined this patient and agree with plan per Dr De Nurse.  Renal fx cont to improve.  BP a little better with midodrine.  Renal fx should cont to get better, will sign off.  Call if further renal issues. Jakeria Caissie T,MD 05/04/2013 10:05 AM

## 2013-05-04 NOTE — Progress Notes (Signed)
[facility] Progress Note [place] TEAM 1 - Stepdown/ICU TEAM   Jack [name] ZOX:[id number] DOB: 05-Jun-1946 DOA: 04/30/2013 PCP: Simone Curia, MD  Admit HPI / Brief Narrative: 67 y.o. male with complicated medical history including a recent stay at [facility] for PE S/P TURP who required subsequent bilateral renal stents for obstruction due to hemorrhagic complications of anticoagulation s/p eventual IVC filter. Patient was seen in the ED at [facility] 9/17 where his work up demonstrated creatinine of 6.0, WBC of 37k, potassium of 5.8, sodium 127, tachycardia, and mild hypotension. VQ scan was repeated which was negative for PE. He was given kayexelate in the ED, 2L NS, and transferred to [facility].  8/29 - TURP w/ L ureteral stent [facility] 9/02 - admit [facility] w/ acute PE >> anticoag >> severe hematuria + anemia + renal failure  9/3 - CT abdom reveals large L renal capsular hematoma 9/5 - transferred to [facility] to PCCM service - noted to have large clot in bladder w/ B hydronephrosis 9/5 - IVC filter placement  9/6 - B ureteral stents placed   Assessment/Plan:  Hypotension  Appears to be euvolemic at this time - ECHO revealed preserved LV function but unable to assess RV due to poor visualization - transfused 2U PRBC this hospitalization - BP improving   Progressive anemia  Likely a combination of significant recent acute loss plus decreased production - the patient has been transfused 2 units packed red blood cells - hemoglobin holding steady  Leukocytosis  WBC now normalized - completed a five-day course of empiric antibiotic therapy  Acute renal insufficiency crt was 1.85 at time of d/c 9/10 - Nephrology following - ultrasound and Ct abd/pelvis reviewed and there are no acute findings - renal function improving continuously  Hyperkalemia >> hypokalemia Due to above - replaced per Nephrology  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 - given recent  severe complications related to bleeding anticoagulation was not felt to be wise at the time of admission - further review of records reveals Dopplers from 9/7 at which time there were actually no DVTs appreciated - repeat dopplers now confirmed extensive bilateral lower extremity DVTs - d/c'ed testosterone replacement as can increase risk of DVT - suspect patient is not a candidate for catheter directed thrombolysis given recent bleeding but will ask interventional radiology to comment - may be forced to consider systemic anticoagulation again for fear of propagating clot above the IVC filter  S/p transurethral resection of prostate with left ureteroscopic stone manipulation 04/13/13 complicated by perinephric hematoma with bilateral hydronephrosis underwent urgent cystoscopy 04/18/2013 with bilateral stent placement and bladder evacuation with suprapubic tube placement - Urology is following   Code Status: FULL Family Communication: Spoke with patient and wife at bedside  Disposition Plan: SDU  Consultants: Nephrology Urology  Procedures: None  Antibiotics: Zosyn 9/16 >> 9/20 Vancomycin 9/16 >>9/18  DVT prophylaxis: None - IVC filter in place  HPI/Subjective: Patient is alert and oriented.  He denies chest pain fevers chills nausea vomiting shortness of breath.  He is in good spirits today.    Objective: Blood pressure 118/61, pulse 70, temperature 98.2 F (36.8 C), temperature source Oral, resp. rate 18, height 5\' 9"  (1.753 m), weight 133.1 kg (293 lb 6.9 oz), SpO2 100.00%.  Intake/Output Summary (Last 24 hours) at 05/04/13 1534 Last data filed at 05/04/13 1156  Gross per 24 hour  Intake    360 ml  Output   2725 ml  Net  -  2365 ml   Exam: General: No acute respiratory distress Lungs: Clear to auscultation bilaterally without wheezes or crackles Cardiovascular: Regular rate and rhythm without murmur gallop or rub  Abdomen: Obese, nondistended, soft, bowel sounds positive, no  rebound, no ascites, no appreciable mass  Extremities: 3+ plus edema bilateral lower extremities to pelvis  Data Reviewed: Basic Metabolic Panel:  Recent Labs Lab 05/01/13 0014 05/01/13 0437 05/02/13 0325 05/03/13 0530 05/04/13 0533  NA 125* 126* 129* 126* 129*  K 3.9 3.9 3.5 3.1* 3.2*  CL 86* 85* 87* 86* 90*  CO2 20 22 26 24 28   GLUCOSE 115* 123* 123* 115* 115*  BUN 99* 102* 97* 86* 68*  CREATININE 6.08* 6.18* 5.05* 3.80* 2.78*  CALCIUM 7.1* 7.4* 7.1* 7.4* 7.9*  PHOS  --  9.4* 7.9* 5.4* 3.8   Liver Function Tests:  Recent Labs Lab 04/30/13 0300 05/01/13 0437 05/02/13 0325 05/03/13 0530 05/04/13 0533  AST 20  --   --   --   --   ALT 15  --   --   --   --   ALKPHOS 98  --   --   --   --   BILITOT 0.5  --   --   --   --   PROT 6.4  --   --   --   --   ALBUMIN 2.7* 2.2* 2.1* 2.0* 2.1*   CBC:  Recent Labs Lab 04/30/13 0300 05/01/13 0437 05/02/13 0325 05/03/13 0530 05/04/13 0533  WBC 26.1* 15.9* 13.3* 12.0* 9.1  NEUTROABS  --  13.5* 10.6*  --   --   HGB 8.6* 7.3* 7.2* 9.0* 9.0*  HCT 24.8* 20.7* 20.5* 26.0* 26.5*  MCV 83.8 82.8 83.3 84.4 85.2  PLT 159 134* 145* 163 180   BNP (last 3 results)  Recent Labs  04/18/13 1245 04/27/13 1240  PROBNP 1762.0* 1260.0*    Studies:  Recent x-ray studies have been reviewed in detail by the Attending Physician  Scheduled Meds:  Scheduled Meds: . midodrine  10 mg Oral TID WC  . sodium chloride  3 mL Intravenous Q12H    Time spent on care of this patient: 35 mins   MCCLUNG,JEFFREY T, MD  [facility] Office  [phone] Pager - Text Page per Loretha Stapler as per below:  On-Call/Text Page:      [email or web address]      password [email or web address]  If 7PM-7AM, please contact night-coverage [email or web address] Password [facility] 05/04/2013, 3:34 PM   LOS: 4 days

## 2013-05-04 NOTE — Progress Notes (Signed)
Bilateral lower extremity venous duplex completed.  Bilateral:  Positive for DVT in the common femoral, profunda, femoral, popliteal, and posterior tibial veins with superficial thrombus in the greater and lesser saphenous veins.

## 2013-05-05 LAB — CBC
HCT: 27.5 % — ABNORMAL LOW (ref 39.0–52.0)
Hemoglobin: 9.3 g/dL — ABNORMAL LOW (ref 13.0–17.0)
Hemoglobin: 9.6 g/dL — ABNORMAL LOW (ref 13.0–17.0)
MCHC: 33.8 g/dL (ref 30.0–36.0)
Platelets: 260 10*3/uL (ref 150–400)
RBC: 3.19 MIL/uL — ABNORMAL LOW (ref 4.22–5.81)
RBC: 3.33 MIL/uL — ABNORMAL LOW (ref 4.22–5.81)
WBC: 10 10*3/uL (ref 4.0–10.5)

## 2013-05-05 LAB — BASIC METABOLIC PANEL
BUN: 51 mg/dL — ABNORMAL HIGH (ref 6–23)
CO2: 30 mEq/L (ref 19–32)
Chloride: 93 mEq/L — ABNORMAL LOW (ref 96–112)
Glucose, Bld: 108 mg/dL — ABNORMAL HIGH (ref 70–99)
Potassium: 3.7 mEq/L (ref 3.5–5.1)
Sodium: 130 mEq/L — ABNORMAL LOW (ref 135–145)

## 2013-05-05 LAB — TYPE AND SCREEN: Unit division: 0

## 2013-05-05 LAB — APTT: aPTT: 30 seconds (ref 24–37)

## 2013-05-05 LAB — PROTIME-INR: INR: 1.28 (ref 0.00–1.49)

## 2013-05-05 LAB — HEPARIN LEVEL (UNFRACTIONATED): Heparin Unfractionated: 0.33 IU/mL (ref 0.30–0.70)

## 2013-05-05 MED ORDER — HEPARIN (PORCINE) IN NACL 100-0.45 UNIT/ML-% IJ SOLN
2000.0000 [IU]/h | INTRAMUSCULAR | Status: DC
  Administered 2013-05-06 – 2013-05-08 (×5): 1900 [IU]/h via INTRAVENOUS
  Administered 2013-05-10: 2000 [IU]/h via INTRAVENOUS
  Administered 2013-05-10: 1900 [IU]/h via INTRAVENOUS
  Administered 2013-05-10 – 2013-05-12 (×5): 2000 [IU]/h via INTRAVENOUS
  Filled 2013-05-05 (×12): qty 250

## 2013-05-05 MED ORDER — HEPARIN (PORCINE) IN NACL 100-0.45 UNIT/ML-% IJ SOLN
1800.0000 [IU]/h | INTRAMUSCULAR | Status: DC
  Administered 2013-05-05: 1800 [IU]/h via INTRAVENOUS
  Filled 2013-05-05 (×2): qty 250

## 2013-05-05 NOTE — Progress Notes (Signed)
Occupational Therapy Evaluation Patient Details Name: Jack [name] MRN: [id number] DOB: 11/14/1945 Today's Date: 05/05/2013 Time: 4098-1191 OT Time Calculation (min): 31 min  OT Assessment / Plan / Recommendation History of present illness [name] underwent TURP and L ureteral stent 8/29. Admitted to [facility] 9/02 with acute PE and anticoagulation initiated. Hospitalization c/b hematuria, progressive rean linsufficiency and anemia requiring transfusion. Bladder irrigations initiated and CT scan abdomen performed 9/03 revealed L renal fluid subcapsular fluid collection thought to represent hematoma. Transferred to [facility] 9/05 for impending need for HD and placement of IVC filter. Currently has a suprapubic catheter with CBI.     Clinical Impression   PTA, pt lived at home with wife and was mod i with ADL and mobility. Pt presents with a functional decline, however, is making steady progress. Pt has all nec DME for safe D/C home. Pt can provide 24/7 S and house is w/c accessible. If pt continues to progress, he may be able to D/C home with [facility] services @ w/c level. If progress is not as expected, he may benefit from short rehab stay at [facility]. Will continue to assess.     OT Assessment  Patient needs continued OT Services    Follow Up Recommendations       Barriers to Discharge      Equipment Recommendations       Recommendations for Other Services    Frequency       Precautions / Restrictions Precautions Precautions: Fall Precaution Comments: starting Coumadin 9/22 Restrictions Weight Bearing Restrictions: No   Pertinent Vitals/Pain no apparent distress     ADL  Grooming: Set up Where Assessed - Grooming: Unsupported sitting Upper Body Bathing: Set up;Supervision/safety Where Assessed - Upper Body Bathing: Supported sitting Lower Body Bathing: Moderate assistance Where Assessed - Lower Body Bathing: Supported sit to stand Upper Body Dressing: Supervision/safety;Modified  independent Where Assessed - Upper Body Dressing: Supported sitting Lower Body Dressing: Moderate assistance Where Assessed - Lower Body Dressing: Supported sit to stand Toilet Transfer: Minimal assistance Statistician Method: Surveyor, minerals: Materials engineer and Hygiene: Minimal assistance Where Assessed - Engineer, mining and Hygiene: Sit to stand from 3-in-1 or toilet Equipment Used: Gait belt;Rolling walker Transfers/Ambulation Related to ADLs: min A sit - stand.  ADL Comments: limited by fatigue/weakness and BLE weakness    OT Diagnosis: Generalized weakness  OT Problem List: Decreased strength;Decreased activity tolerance;Impaired balance (sitting and/or standing);Decreased knowledge of use of DME or AE;Obesity;Pain;Increased edema;Cardiopulmonary status limiting activity OT Treatment Interventions:     OT Goals(Current goals can be found in the care plan section) Acute Rehab OT Goals Patient Stated Goal: to go home OT Goal Formulation: With patient Time For Goal Achievement: 05/19/13 Potential to Achieve Goals: Good  Visit Information  Last OT Received On: 05/05/13 Assistance Needed: +1 (for ambulation) History of Present Illness: [name] underwent TURP and L ureteral stent 8/29. Admitted to [facility] 9/02 with acute PE and anticoagulation initiated. Hospitalization c/b hematuria, progressive rean linsufficiency and anemia requiring transfusion. Bladder irrigations initiated and CT scan abdomen performed 9/03 revealed L renal fluid subcapsular fluid collection thought to represent hematoma. Transferred to [facility] 9/05 for impending need for HD and placement of IVC filter. Currently has a suprapubic catheter with CBI.         Prior Functioning     Home Living Family/patient expects to be discharged to:: Private residence Living Arrangements: Spouse/significant other;Children Available Help at Discharge:  Family;Available 24 hours/day Type of Home: Mobile home Home Access: Ramped entrance Home Layout: Multi-level Alternate Level Stairs-Number of Steps: 1 step to bedroom Alternate Level Stairs-Rails: None Home Equipment: Bedside commode;Walker - 2 wheels;Hand held shower head;Hospital bed (walking sticks) Prior Function Level of Independence: Independent with assistive device(s) (uses walking stick) Communication Communication: No difficulties Dominant Hand: Right         Vision/Perception     Cognition  Cognition Arousal/Alertness: Awake/alert Behavior During Therapy: WFL for tasks assessed/performed Overall Cognitive Status: Within Functional Limits for tasks assessed    Extremity/Trunk Assessment Upper Extremity Assessment Upper Extremity Assessment: Overall WFL for tasks assessed Lower Extremity Assessment RLE Deficits / Details: grossly 3-/5, edema RLE Sensation: decreased light touch LLE Deficits / Details: grossly 3-/5 with edema LLE Sensation: decreased light touch Cervical / Trunk Assessment Cervical / Trunk Assessment: Normal     Mobility Bed Mobility Bed Mobility: Rolling Right;Right Sidelying to Sit;Sitting - Scoot to Edge of Bed Rolling Right: 5: Supervision Right Sidelying to Sit: 5: Supervision;HOB elevated Sitting - Scoot to Edge of Bed: 5: Supervision;With rail Details for Bed Mobility Assistance: Improved prformance Transfers Sit to Stand: 3: Mod assist;With upper extremity assist;From bed;From elevated surface Stand to Sit: 3: Mod assist;With upper extremity assist;To bed Details for Transfer Assistance: Pt needed cues for hand placement with right hand on bed and left pulled up on RW.  PT placed bed panunder pt on bed and pt had BM.  Then pt stood again and nursing cleaned pt.  Pt decided he could pivot to recliner and took a few steps around to recliner with steadying assist and cues.       Exercise Other Exercises Other Exercises: BUE AROM Other  Exercises: BUE LUE AROM   Balance Balance Balance Assessed: Yes Static Standing Balance Static Standing - Balance Support: Bilateral upper extremity supported;During functional activity Static Standing - Level of Assistance: 4: Min assist   End of Session OT - End of Session Equipment Utilized During Treatment: Gait belt;Rolling walker Activity Tolerance: Patient limited by fatigue Patient left: in chair;with call bell/phone within reach;with family/visitor present Nurse Communication: Mobility status  GO     Bertine Schlottman,HILLARY 05/05/2013, 12:34 PM [facility], OTR/L  [phone] 05/05/2013

## 2013-05-05 NOTE — Progress Notes (Signed)
ANTICOAGULATION CONSULT NOTE - Initial Consult  Pharmacy Consult for Heparin Indication: Bilateral DVTs, h/o PE, MD concerned about movement of clot through IVC filter  Allergies  Allergen Reactions  . Contrast Media [Iodinated Diagnostic Agents]     Blue dye    Patient Measurements: Height: 5\' 9"  (175.3 cm) Weight: 286 lb 9.6 oz (130 kg) IBW/kg (Calculated) : 70.7 Heparin Dosing Weight: 101 kg  Vital Signs: Temp: 97.6 F (36.4 C) (09/22 0709) Temp src: Oral (09/22 0709) BP: 99/84 mmHg (09/22 0709) Pulse Rate: 66 (09/22 0334)  Labs:  Recent Labs  05/03/13 0530 05/04/13 0533 05/05/13 0355  HGB 9.0* 9.0* 9.3*  HCT 26.0* 26.5* 27.5*  PLT 163 180 208  APTT  --   --  30  LABPROT  --   --  15.7*  INR  --   --  1.28  CREATININE 3.80* 2.78* 1.96*    Estimated Creatinine Clearance: 49.5 ml/min (by C-G formula based on Cr of 1.96).   Medical History: Past Medical History  Diagnosis Date  . HTN (hypertension)   . Ureteral stone   . Colon cancer   . Shortness of breath   . Asthma   . Chronic kidney disease     acuts on chronic renal failure  . Arthritis   . PE (pulmonary embolism) 04/2013    Assessment: 67 y/o M recently DC from hospital with IVC filter for presumed PE (no AC as pt was thought to have perinephric hematoma after resection of prostate and left ureteroscopic stone manipulation. Now with new findings of extensive bilateral DVTs, MD opting to start anticoagulation. Baseline INR is 1.28, aPTT 30, Hgb 9.3 (was 7.2 on 9/19). Plts 208. Scr 1.96<2.78 with CrCl ~ 50.   Goal of Therapy:  Heparin level 0.3-0.7 units/ml Monitor platelets by anticoagulation protocol: Yes   Plan:  -NO BOLUS given recent perinephric hematoma  -Start heparin infusion at 1800 units/hr -8 hour HL at 2030 -Daily CBC/HL -Monitor CLOSELY for bleeding, drops in Hgb -F/U MD plans for [facility] -Pt does have IVC in place  Thank you for allowing me to take part in this patient's  care,  Abran Duke, PharmD Clinical Pharmacist Phone: [phone] Pager: [phone] 05/05/2013 12:05 PM

## 2013-05-05 NOTE — Progress Notes (Signed)
ANTICOAGULATION CONSULT NOTE - Follow Up Consult   HL = 0.33 (goal 0.3 - 0.7 units/mL) Heparin dosing weight = 101 kg   Assessment: 53 YOM with history of PE to continue on IV heparin for bilateral DVTs.  He does have an IVC filter.  Heparin level is therapeutic; no bleeding reported.  Patient's hemoglobin and platelets are improving.   Plan: - Increase heparin gtt to 1900 units/hr - F/U AM labs     Kent Braunschweig D. Laney Potash, PharmD, BCPS Pager:  [phone] 05/05/2013, 9:40 PM

## 2013-05-05 NOTE — Progress Notes (Signed)
I have discussed with [employer] the possibility of an inpatient rehabilitation admission. They will not approve admission at this time for this diagnosis. Please call me with any questions. I will alert RN CM. [phone]

## 2013-05-05 NOTE — Progress Notes (Signed)
[facility] Progress Note [place] TEAM 1 - Stepdown/ICU TEAM   Jack [name] ZOX:[id number] DOB: 07/09/1946 DOA: 04/30/2013 PCP: Simone Curia, MD  Admit HPI / Brief Narrative: 67 y.o. male with complicated medical history including a recent stay at [facility] for PE S/P TURP who required subsequent bilateral renal stents for obstruction due to hemorrhagic complications of anticoagulation s/p eventual IVC filter. Patient was seen in the ED at [facility] 9/17 where his work up demonstrated creatinine of 6.0, WBC of 37k, potassium of 5.8, sodium 127, tachycardia, and mild hypotension. VQ scan was repeated which was negative for PE. He was given kayexelate in the ED, 2L NS, and transferred to [facility]  8/29 - TURP w/ L ureteral stent [facility] 9/02 - admit [facility] w/ acute PE >> anticoag >> severe hematuria + anemia + renal failure  9/3 - CT abdom reveals large L renal capsular hematoma 9/5 - transferred to [facility] to PCCM service - noted to have large clot in bladder w/ B hydronephrosis 9/5 - IVC filter placement  9/6 - B ureteral stents placed   Assessment/Plan:  Hypotension  Appears to be euvolemic at this time - ECHO revealed preserved LV function but unable to assess RV due to poor visualization - transfused 2U PRBC this hospitalization - BP stable at this time   Acute renal failure This was the presenting diagnosis for this admit - crt was 1.85 at time of d/c 9/10 - Nephrology has now signed off - ultrasound and Ct abd/pelvis reviewed and there are no acute findings - renal function improving continuously - most likely simple pre-renal state v/s ATN related to hypotension  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 9/5  - given recent severe complications related to bleeding anticoagulation was not felt to be wise at the time of admission - further review of records reveals Dopplers from 9/7 which were negative for DVT - repeat dopplers this hospitalization  confirmed extensive bilateral lower extremity DVTs - d/c'ed testosterone replacement - due to fear of propagation of clot through IVC filter I have chosen to initiate heparin today after discussing the real risk of bleeding complications with the patient at length - I have spoken with interventional radiology and they will investigate the possibility of candidacy for catheter directed thrombolysis if the patient tolerates anticoagulation over the next 24-48 hours   Progressive anemia  Likely a combination of significant recent acute loss plus decreased production - the patient has been transfused 2 units packed red blood cells - hemoglobin holding steady/increasing  Leukocytosis  WBC now normalized - completed a five-day course of empiric antibiotic therapy  Hyperkalemia >> hypokalemia Due to above - replaced and now normalized   S/p transurethral resection of prostate with left ureteroscopic stone manipulation 04/13/13 complicated by perinephric hematoma with bilateral hydronephrosis underwent urgent cystoscopy 04/18/2013 with bilateral stent placement and bladder evacuation with suprapubic tube placement - Urology is following   Code Status: FULL Family Communication: Spoke with patient and wife at bedside  Disposition Plan: SDU  Consultants: Nephrology Urology IR  Procedures: None  Antibiotics: Zosyn 9/16 >> 9/20 Vancomycin 9/16 >>9/18  DVT prophylaxis: IV heparin - IVC filter   HPI/Subjective: Patient is alert and oriented.  He denies chest pain fevers chills nausea vomiting shortness of breath.  He is in good spirits today.  He understands the risk of anticoagulation but also the need for same and is willing to proceed.    Objective: Blood pressure 118/62, pulse  81, temperature 98.1 F (36.7 C), temperature source Oral, resp. rate 17, height 5\' 9"  (1.753 m), weight 130 kg (286 lb 9.6 oz), SpO2 98.00%.  Intake/Output Summary (Last 24 hours) at 05/05/13 1304 Last data filed  at 05/05/13 1100  Gross per 24 hour  Intake    303 ml  Output   2100 ml  Net  -1797 ml   Exam: General: No acute respiratory distress Lungs: Clear to auscultation bilaterally without wheezes or crackles Cardiovascular: Regular rate and rhythm without murmur gallop or rub  Abdomen: Obese, nondistended, soft, bowel sounds positive, no rebound, no ascites, no appreciable mass  Extremities: 3+ plus edema bilateral lower extremities to pelvis without significant change   Data Reviewed: Basic Metabolic Panel:  Recent Labs Lab 05/01/13 0437 05/02/13 0325 05/03/13 0530 05/04/13 0533 05/05/13 0355  NA 126* 129* 126* 129* 130*  K 3.9 3.5 3.1* 3.2* 3.7  CL 85* 87* 86* 90* 93*  CO2 22 26 24 28 30   GLUCOSE 123* 123* 115* 115* 108*  BUN 102* 97* 86* 68* 51*  CREATININE 6.18* 5.05* 3.80* 2.78* 1.96*  CALCIUM 7.4* 7.1* 7.4* 7.9* 7.9*  PHOS 9.4* 7.9* 5.4* 3.8  --    Liver Function Tests:  Recent Labs Lab 04/30/13 0300 05/01/13 0437 05/02/13 0325 05/03/13 0530 05/04/13 0533  AST 20  --   --   --   --   ALT 15  --   --   --   --   ALKPHOS 98  --   --   --   --   BILITOT 0.5  --   --   --   --   PROT 6.4  --   --   --   --   ALBUMIN 2.7* 2.2* 2.1* 2.0* 2.1*   CBC:  Recent Labs Lab 05/01/13 0437 05/02/13 0325 05/03/13 0530 05/04/13 0533 05/05/13 0355  WBC 15.9* 13.3* 12.0* 9.1 9.2  NEUTROABS 13.5* 10.6*  --   --   --   HGB 7.3* 7.2* 9.0* 9.0* 9.3*  HCT 20.7* 20.5* 26.0* 26.5* 27.5*  MCV 82.8 83.3 84.4 85.2 86.2  PLT 134* 145* 163 180 208   BNP (last 3 results)  Recent Labs  04/18/13 1245 04/27/13 1240  PROBNP 1762.0* 1260.0*    Studies:  Recent x-ray studies have been reviewed in detail by the Attending Physician  Scheduled Meds:  Scheduled Meds: . midodrine  10 mg Oral TID WC  . sodium chloride  3 mL Intravenous Q12H    Time spent on care of this patient: 35 mins   Soma Lizak T, MD  [facility] Office  [phone] Pager - Text Page  per Loretha Stapler as per below:  On-Call/Text Page:      [email or web address]      password [email or web address]  If 7PM-7AM, please contact night-coverage [email or web address] Password [email or web address] 05/05/2013, 1:04 PM   LOS: 5 days

## 2013-05-05 NOTE — Progress Notes (Signed)
Utilization review completed.  

## 2013-05-05 NOTE — Progress Notes (Signed)
Physical Therapy Treatment Patient Details Name: Jack [name] MRN: [id number] DOB: 1945-12-09 Today's Date: 05/05/2013 Time: 7829-5621 PT Time Calculation (min): 18 min  PT Assessment / Plan / Recommendation  History of Present Illness Jack [name] underwent TURP and L ureteral stent 8/29. Admitted to [facility] 9/02 with acute PE and anticoagulation initiated. Hospitalization c/b hematuria, progressive rean linsufficiency and anemia requiring transfusion. Bladder irrigations initiated and CT scan abdomen performed 9/03 revealed L renal fluid subcapsular fluid collection thought to represent hematoma. Transferred to [facility] 9/05 for impending need for HD and placement of IVC filter. Currently has a suprapubic catheter with CBI.     PT Comments   Pt is progressing well with mobility.  He is at min assist level overall and was able to walk around room with RW today.  Since he is not a candidate for CIR level therapies and his mobility is progressing so quickly, I agree with OT with HHPT and his wife's supervision/min assist.    Follow Up Recommendations  Home health PT;Supervision/Assistance - 24 hour     Does the patient have the potential to tolerate intense rehabilitation    Yes  Barriers to Discharge   None      Equipment Recommendations  None recommended by PT    Recommendations for Other Services   None  Frequency Min 3X/week   Progress towards PT Goals Progress towards PT goals: Progressing toward goals  Plan Discharge plan needs to be updated    Precautions / Restrictions Precautions Precautions: Fall Precaution Comments: starting Coumadin 9/22   Pertinent Vitals/Pain See vitals flow sheet.    Mobility  Bed Mobility Rolling Left: 6: Modified independent (Device/Increase time);With rail Left Sidelying to Sit: 6: Modified independent (Device/Increase time);With rails;HOB flat Details for Bed Mobility Assistance: Pt relying heavily on railing, but able to move to EOB without external  assist.  Transfers Sit to Stand: 4: Min assist;From elevated surface;With upper extremity assist;From bed Stand to Sit: 4: Min assist;With upper extremity assist;With armrests;To chair/3-in-1 Details for Transfer Assistance: Min assist to steady pt for balance.  Pt benefited from elevated surface to increase ease of transfer.  He is tall to begin with and would have a very hard time getting up from a lower surface.   Ambulation/Gait Ambulation/Gait Assistance: 4: Min assist Ambulation Distance (Feet): 20 Feet Assistive device: Rolling walker Ambulation/Gait Assistance Details: min assist with RW to get to doorway and back to recliner on far side of the room.  Pt was worried that he would not make it because of his left leg pain, but he did.  RN helped with IV pole so that therapist could have both hands on pt for safety.  He agreed that he could walk further if we followed him with a recliner or a WC.   Gait Pattern: Step-through pattern;Antalgic;Trunk flexed      PT Goals (current goals can now be found in the care plan section) Acute Rehab PT Goals Patient Stated Goal: to go home  Visit Information  Last PT Received On: 05/05/13 Assistance Needed: +1 (+2 for chair to follow would be helpful) History of Present Illness: Jack [name] underwent TURP and L ureteral stent 8/29. Admitted to [facility] 9/02 with acute PE and anticoagulation initiated. Hospitalization c/b hematuria, progressive rean linsufficiency and anemia requiring transfusion. Bladder irrigations initiated and CT scan abdomen performed 9/03 revealed L renal fluid subcapsular fluid collection thought to represent hematoma. Transferred to [facility] 9/05 for impending need for HD and placement  of IVC filter. Currently has a suprapubic catheter with CBI.      Subjective Data  Subjective: Pt wants to go home with wife.  He reports his left leg buckles at times due to pain.   Patient Stated Goal: to go home   Cognition   Cognition Arousal/Alertness: Awake/alert (HOH) Behavior During Therapy: WFL for tasks assessed/performed Overall Cognitive Status: Within Functional Limits for tasks assessed    Balance  Balance Balance Assessed: Yes Static Sitting Balance Static Sitting - Balance Support: Bilateral upper extremity supported;Feet supported Static Sitting - Level of Assistance: 6: Modified independent (Device/Increase time) Static Standing Balance Static Standing - Balance Support: Bilateral upper extremity supported Static Standing - Level of Assistance: 4: Min assist Dynamic Standing Balance Dynamic Standing - Balance Support: Bilateral upper extremity supported Dynamic Standing - Level of Assistance: 4: Min assist  End of Session PT - End of Session Equipment Utilized During Treatment: Gait belt Activity Tolerance: Patient limited by fatigue;Patient limited by pain Patient left: in chair;with call bell/phone within reach;with family/visitor present     Lurena Joiner B. Shayanne Gomm, PT, DPT [phone]   05/05/2013, 5:14 PM

## 2013-05-06 DIAGNOSIS — R319 Hematuria, unspecified: Secondary | ICD-10-CM

## 2013-05-06 LAB — BASIC METABOLIC PANEL
BUN: 42 mg/dL — ABNORMAL HIGH (ref 6–23)
Chloride: 95 mEq/L — ABNORMAL LOW (ref 96–112)
GFR calc Af Amer: 48 mL/min — ABNORMAL LOW (ref >90)
GFR calc non Af Amer: 41 mL/min — ABNORMAL LOW (ref >90)
Glucose, Bld: 107 mg/dL — ABNORMAL HIGH (ref 70–99)
Potassium: 3.7 mEq/L (ref 3.5–5.1)
Sodium: 132 mEq/L — ABNORMAL LOW (ref 135–145)

## 2013-05-06 LAB — CULTURE, BLOOD (ROUTINE X 2): Culture: NO GROWTH

## 2013-05-06 LAB — CBC
HCT: 28.9 % — ABNORMAL LOW (ref 39.0–52.0)
Hemoglobin: 9.3 g/dL — ABNORMAL LOW (ref 13.0–17.0)
MCHC: 32.2 g/dL (ref 30.0–36.0)
WBC: 8.8 10*3/uL (ref 4.0–10.5)

## 2013-05-06 LAB — HEPARIN LEVEL (UNFRACTIONATED): Heparin Unfractionated: 0.49 IU/mL (ref 0.30–0.70)

## 2013-05-06 NOTE — Progress Notes (Signed)
Agree with PA note.  Will continue to follow.  Signed,  Sterling Big, MD [facility]

## 2013-05-06 NOTE — Progress Notes (Signed)
[facility] Progress Note [place] TEAM 1 - Stepdown/ICU TEAM   TAYLAN MAREZ WGN:[id number] DOB: November 09, 1945 DOA: 04/30/2013 PCP: Simone Curia, MD  Admit HPI / Brief Narrative: 67 y.o. male with complicated medical history including a recent stay at [facility] for PE S/P TURP who required subsequent bilateral renal stents for obstruction due to hemorrhagic complications of anticoagulation s/p eventual IVC filter. Patient was seen in the ED at [facility] 9/17 where his work up demonstrated creatinine of 6.0, WBC of 37k, potassium of 5.8, sodium 127, tachycardia, and mild hypotension. VQ scan was repeated which was negative for PE. He was given kayexelate in the ED, 2L NS, and transferred to [facility].  8/29 - TURP w/ L ureteral stent [facility] 9/02 - admit [facility] w/ acute PE >> anticoag >> severe hematuria + anemia + renal failure  9/3 - CT abdom reveals large L renal capsular hematoma 9/5 - transferred to [facility] to PCCM service - noted to have large clot in bladder w/ B hydronephrosis 9/5 - IVC filter placement  9/6 - B ureteral stents placed   Assessment/Plan:  Hypotension  Appears to be euvolemic at this time - ECHO revealed preserved LV function but unable to assess RV due to poor visualization - transfused 2U PRBC this hospitalization - BP stable at this time   Acute renal failure This was the presenting diagnosis for this admit - crt was 1.85 at time of d/c 9/10 - Nephrology has now signed off - ultrasound and Ct abd/pelvis reviewed and there are no acute findings - renal function improving continuously - most likely simple pre-renal state v/s ATN related to hypotension  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 9/5  - given recent severe complications related to bleeding anticoagulation was not felt to be wise at the time of admission - further review of records reveals Dopplers from 9/7 which were negative for DVT - repeat dopplers this hospitalization  confirmed extensive bilateral lower extremity DVTs - d/c'ed testosterone replacement - due to fear of propagation of clot through IVC filter I have chosen to initiate heparin today after discussing the real risk of bleeding complications with the patient at length - Dr Dolphus Jenny has consulted IR for possible thrombectomy- appreciate consult  Progressive anemia  Likely a combination of significant recent acute loss plus decreased production - the patient has been transfused 2 units packed red blood cells - hemoglobin holding stead on Heparin  Leukocytosis  WBC now normalized - completed a five-day course of empiric antibiotic therapy  Hyperkalemia >> hypokalemia Due to above - replaced and now normalized   S/p transurethral resection of prostate with left ureteroscopic stone manipulation 04/13/13 complicated by perinephric hematoma with bilateral hydronephrosis underwent urgent cystoscopy 04/18/2013 with bilateral stent placement and bladder evacuation with suprapubic tube placement - Urology is following   Code Status: FULL Family Communication: Spoke with patient and his daughter Disposition Plan: SDU  Consultants: Nephrology Urology IR  Procedures: None  Antibiotics: Zosyn 9/16 >> 9/20 Vancomycin 9/16 >>9/18  DVT prophylaxis: IV heparin - IVC filter   HPI/Subjective: Patient is sitting up in a chair- ambulated with PT- left leg is very heavy but he can lift it a little. No other complaints.   Objective: Blood pressure 112/68, pulse 71, temperature 97.9 F (36.6 C), temperature source Oral, resp. rate 18, height 5\' 9"  (1.753 m), weight 130.6 kg (287 lb 14.7 oz), SpO2 98.00%.  Intake/Output Summary (Last 24 hours) at 05/06/13 1802 Last data  filed at 05/06/13 1543  Gross per 24 hour  Intake 955.57 ml  Output   1625 ml  Net -669.43 ml   Exam: General: No acute respiratory distress Lungs: Clear to auscultation bilaterally without wheezes or crackles Cardiovascular: Regular  rate and rhythm without murmur gallop or rub  Abdomen: Obese, nondistended, soft, bowel sounds positive, no rebound, no ascites, no appreciable mass  Extremities: 3+ plus edema bilateral lower extremities to pelvis without significant change   Data Reviewed: Basic Metabolic Panel:  Recent Labs Lab 05/01/13 0437 05/02/13 0325 05/03/13 0530 05/04/13 0533 05/05/13 0355 05/06/13 0448  NA 126* 129* 126* 129* 130* 132*  K 3.9 3.5 3.1* 3.2* 3.7 3.7  CL 85* 87* 86* 90* 93* 95*  CO2 22 26 24 28 30 27   GLUCOSE 123* 123* 115* 115* 108* 107*  BUN 102* 97* 86* 68* 51* 42*  CREATININE 6.18* 5.05* 3.80* 2.78* 1.96* 1.66*  CALCIUM 7.4* 7.1* 7.4* 7.9* 7.9* 8.1*  PHOS 9.4* 7.9* 5.4* 3.8  --   --    Liver Function Tests:  Recent Labs Lab 04/30/13 0300 05/01/13 0437 05/02/13 0325 05/03/13 0530 05/04/13 0533  AST 20  --   --   --   --   ALT 15  --   --   --   --   ALKPHOS 98  --   --   --   --   BILITOT 0.5  --   --   --   --   PROT 6.4  --   --   --   --   ALBUMIN 2.7* 2.2* 2.1* 2.0* 2.1*   CBC:  Recent Labs Lab 05/01/13 0437 05/02/13 0325 05/03/13 0530 05/04/13 0533 05/05/13 0355 05/05/13 2055 05/06/13 0448  WBC 15.9* 13.3* 12.0* 9.1 9.2 10.0 8.8  NEUTROABS 13.5* 10.6*  --   --   --   --   --   HGB 7.3* 7.2* 9.0* 9.0* 9.3* 9.6* 9.3*  HCT 20.7* 20.5* 26.0* 26.5* 27.5* 29.1* 28.9*  MCV 82.8 83.3 84.4 85.2 86.2 87.4 87.8  PLT 134* 145* 163 180 208 260 238   BNP (last 3 results)  Recent Labs  04/18/13 1245 04/27/13 1240  PROBNP 1762.0* 1260.0*    Studies:  Recent x-ray studies have been reviewed in detail by the Attending Physician  Scheduled Meds:  Scheduled Meds: . midodrine  10 mg Oral TID WC  . sodium chloride  3 mL Intravenous Q12H    Time spent on care of this patient: 35 mins   Haywood Meinders, MD  [facility] Office  [phone] Pager - Text Page per Loretha Stapler as per below:  On-Call/Text Page:      [email or web address]      password [email or web address]  If 7PM-7AM,  please contact night-coverage [email or web address] Password [email or web address] 05/06/2013, 6:02 PM   LOS: 6 days

## 2013-05-06 NOTE — Progress Notes (Signed)
Subjective: This is a 67 year old male with history of a TURP 04/10/13 at [place]; left ureteral stent and double J. The patient developed chest pain and shortness of breath after discharge. The patient was readmitted and a VQ scan revealed high probability of PE and he was started on IV heparin. The patient developed a left subcapsular hematoma and an IVC filter was placed. The patient has had bilateral leg swelling since. Today he states that since he has been on IV heparin since 9/22 his right leg "coldness" has improved. He states he has left leg "coldness" and "numbness" He states yesterday he was able to walk from the door of his room to the window approx 15 feet. Today he has not walked at all. He denies any skin breakdown and states he thinks the swelling has improved since yesterday of his right leg. He denies any current shortness of breath or chest pain. Last lower ext venous duplex was 05/04/13 Findings consistent with acute deep vein thrombosis involving the right common femoral, profunda, femoral, popliteal, and posterior tibial veins, involving the left common femoral, profunda, femoral, popliteal, and posterior tibial veins. Superficial thrombus is noted in the greater and lesser saphenous veins bilaterally.  Objective: Physical Exam: BP 112/68  Pulse 71  Temp(Src) 98.1 F (36.7 C) (Oral)  Resp 18  Ht 5\' 9"  (1.753 m)  Wt 287 lb 14.7 oz (130.6 kg)  BMI 42.5 kg/m2  SpO2 98%  Abd: (+) BS, no edema palpable. Extremities: 3+ pitting edema bilaterally to knees. No breakdown of skin. Warm to the touch. DP not palpable given edema.  Labs: CBC  Recent Labs  05/05/13 2055 05/06/13 0448  WBC 10.0 8.8  HGB 9.6* 9.3*  HCT 29.1* 28.9*  PLT 260 238   BMET  Recent Labs  05/05/13 0355 05/06/13 0448  NA 130* 132*  K 3.7 3.7  CL 93* 95*  CO2 30 27  GLUCOSE 108* 107*  BUN 51* 42*  CREATININE 1.96* 1.66*  CALCIUM 7.9* 8.1*   LFT  Recent Labs  05/04/13 0533  ALBUMIN 2.1*    PT/INR  Recent Labs  05/05/13 0355  LABPROT 15.7*  INR 1.28    Studies/Results: No results found.  Assessment/Plan: Turp with left ureteral stent 04/10/13 at [place] PE by VQ scan post procedure, renal failure 04/15/13 Left subcapsular hematoma on IV heparin for PE 04/16/13 IVC filter placed 04/18/13 Bilateral ureteral stents placed 04/19/13 Bilateral leg swelling with extensive DVT last doppler 05/04/13, previous venous doppler 04/20/13 no evidence of DVT. not a good lysis candidate given recent bleed on IV heparin.   Patient has been placed on IV heparin 9/22 given extensive DVT burden and not a lysis candidate.  Dr. Archer Asa has discussed with the primary team possibility of DVT intervention with angiovac, which would require the need for venous bypass and further approval. We will continue to monitor the patient on IV heparin today and tomorrow. If renal function continues to improve Cr 1.66 today (1.96) consider CT abdomen/pelvis with delayed venogram, if renal function does not improve consider MR venogram without contrast.    LOS: 6 days    Berneta Levins PA-C 05/06/2013 1:34 PM

## 2013-05-06 NOTE — Progress Notes (Signed)
ANTICOAGULATION CONSULT NOTE - Follow-up  Pharmacy Consult for Heparin Indication: Bilateral DVTs, h/o PE, MD concerned about movement of clot through IVC filter  Allergies  Allergen Reactions  . Contrast Media [Iodinated Diagnostic Agents]     Blue dye    Patient Measurements: Height: 5\' 9"  (175.3 cm) Weight: 287 lb 14.7 oz (130.6 kg) IBW/kg (Calculated) : 70.7 Heparin Dosing Weight: 101 kg  Vital Signs: Temp: 98.1 F (36.7 C) (09/23 0800) Temp src: Oral (09/23 0800) BP: 122/71 mmHg (09/23 0800) Pulse Rate: 71 (09/23 0430)  Labs:  Recent Labs  05/04/13 0533 05/05/13 0355 05/05/13 2055 05/06/13 0448  HGB 9.0* 9.3* 9.6* 9.3*  HCT 26.5* 27.5* 29.1* 28.9*  PLT 180 208 260 238  APTT  --  30  --   --   LABPROT  --  15.7*  --   --   INR  --  1.28  --   --   HEPARINUNFRC  --   --  0.33 0.49  CREATININE 2.78* 1.96*  --  1.66*    Estimated Creatinine Clearance: 58.6 ml/min (by C-G formula based on Cr of 1.66).   Medical History: Past Medical History  Diagnosis Date  . HTN (hypertension)   . Ureteral stone   . Colon cancer   . Shortness of breath   . Asthma   . Chronic kidney disease     acuts on chronic renal failure  . Arthritis   . PE (pulmonary embolism) 04/2013    Assessment: 67 y/o M recently DC from hospital with IVC filter for presumed PE (no AC as pt was thought to have perinephric hematoma after resection of prostate and left ureteroscopic stone manipulation. Now with new findings of extensive bilateral DVTs, MD opting to start anticoagulation. Baseline INR is 1.28, aPTT 30, Hgb 9.3 (was 7.2 on 9/19). Plts 238. Scr 1.66<1.96<2.78 with CrCl ~ 55-60. HL is 0.49<0.33.  Goal of Therapy:  Heparin level 0.3-0.7 units/ml Monitor platelets by anticoagulation protocol: Yes   Plan:  -Continue heparin infusion at 1900 units/hr -Daily CBC/HL -Monitor CLOSELY for bleeding, drops in Hgb -F/U MD plans for [facility] -Pt does have IVC in place  Thank you for allowing  me to take part in this patient's care,  Abran Duke, PharmD Clinical Pharmacist Phone: [phone] Pager: [phone] 05/06/2013 9:54 AM

## 2013-05-07 LAB — CBC
HCT: 29.7 % — ABNORMAL LOW (ref 39.0–52.0)
Hemoglobin: 9.7 g/dL — ABNORMAL LOW (ref 13.0–17.0)
MCH: 28.8 pg (ref 26.0–34.0)
MCHC: 32.7 g/dL (ref 30.0–36.0)
RBC: 3.37 MIL/uL — ABNORMAL LOW (ref 4.22–5.81)

## 2013-05-07 LAB — HEPARIN LEVEL (UNFRACTIONATED): Heparin Unfractionated: 0.66 IU/mL (ref 0.30–0.70)

## 2013-05-07 NOTE — Progress Notes (Signed)
ANTICOAGULATION CONSULT NOTE - Follow-up  Pharmacy Consult for Heparin Indication: Bilateral DVTs, h/o PE, MD concerned about movement of clot through IVC filter  Allergies  Allergen Reactions  . Contrast Media [Iodinated Diagnostic Agents]     Blue dye    Patient Measurements: Height: 5\' 9"  (175.3 cm) Weight: 286 lb 13.1 oz (130.1 kg) IBW/kg (Calculated) : 70.7 Heparin Dosing Weight: 101 kg  Vital Signs: Temp: 97.9 F (36.6 C) (09/24 0700) Temp src: Oral (09/24 0700) BP: 117/68 mmHg (09/24 0400) Pulse Rate: 96 (09/24 0907)  Labs:  Recent Labs  05/05/13 0355 05/05/13 2055 05/06/13 0448 05/07/13 0605  HGB 9.3* 9.6* 9.3* 9.7*  HCT 27.5* 29.1* 28.9* 29.7*  PLT 208 260 238 275  APTT 30  --   --   --   LABPROT 15.7*  --   --   --   INR 1.28  --   --   --   HEPARINUNFRC  --  0.33 0.49 0.66  CREATININE 1.96*  --  1.66*  --     Estimated Creatinine Clearance: 58.5 ml/min (by C-G formula based on Cr of 1.66).   Medical History: Past Medical History  Diagnosis Date  . HTN (hypertension)   . Ureteral stone   . Colon cancer   . Shortness of breath   . Asthma   . Chronic kidney disease     acuts on chronic renal failure  . Arthritis   . PE (pulmonary embolism) 04/2013    Assessment: 67 y/o M recently DC from hospital with IVC filter for presumed PE (no AC as pt was thought to have perinephric hematoma after resection of prostate and left ureteroscopic stone manipulation. Now with new findings of extensive bilateral DVTs, MD opting to start anticoagulation. Baseline INR is 1.28, aPTT 30, Hgb 9.7 (was 7.2 on 9/19). Plts 275. Scr 1.66<1.96<2.78 with CrCl ~ 55-60. HL is 0.66<0.49<0.33.  Goal of Therapy:  Heparin level 0.3-0.7 units/ml Monitor platelets by anticoagulation protocol: Yes   Plan:  -Continue heparin infusion at 1900 units/hr -Daily CBC/HL -Monitor CLOSELY for bleeding, drops in Hgb -F/U MD plans for oral AC -Pt does have IVC in place  Thank you for  allowing me to take part in this patient's care,  Abran Duke, PharmD Clinical Pharmacist Phone: [phone] Pager: [phone] 05/07/2013 10:40 AM

## 2013-05-07 NOTE — Progress Notes (Signed)
Subjective: This is a 67 year old male with history of a TURP 04/10/13 at [place]; left ureteral stent and double J. The patient developed chest pain and shortness of breath after discharge. The patient was readmitted and a VQ scan revealed high probability of PE and he was started on IV heparin. The patient developed a left subcapsular hematoma and an IVC filter was placed. The patient has since had bilateral leg swelling since. Last lower ext venous duplex was 05/04/13 with extensive DVT and he was placed on IV heparin 9/22.  Today he states that since yesterday his right leg swelling is still slowly improving and he feels he can palpate his left knee easier and that swelling is starting to improve on the left. He states he did not ambulate at all yesterday and today he was able to walk down the hallway. He states his left leg "coldness" is now "cool rather than cold" and still he c/o  "numbness" in his left leg. He does admit to an episode today of left leg weakness. While he was walking, he was unable to lift his left leg off floor or even slide it across the floor, he then performed some stretching and was able to continue to walk down hallway.   He does c/o increased shortness of breath when walking the halls, denies chest pain. He states upon sitting in chair his shortness of breath is the same as the day before.   Objective: Physical Exam: BP 129/61  Pulse 96  Temp(Src) 98.3 F (36.8 C) (Oral)  Resp 27  Ht 5\' 9"  (1.753 m)  Wt 286 lb 13.1 oz (130.1 kg)  BMI 42.34 kg/m2  SpO2 99%  Abd: (+) BS, no edema palpable.  Extremities: 3+ pitting edema bilaterally to knees. Left > right. No breakdown of skin. Warm to the touch. DP not palpable given edema.  Labs: CBC  Recent Labs  05/06/13 0448 05/07/13 0605  WBC 8.8 8.0  HGB 9.3* 9.7*  HCT 28.9* 29.7*  PLT 238 275   BMET  Recent Labs  05/05/13 0355 05/06/13 0448  NA 130* 132*  K 3.7 3.7  CL 93* 95*  CO2 30 27  GLUCOSE 108*  107*  BUN 51* 42*  CREATININE 1.96* 1.66*  CALCIUM 7.9* 8.1*   LFT No results found for this basename: PROT, ALBUMIN, AST, ALT, ALKPHOS, BILITOT, BILIDIR, IBILI, LIPASE,  in the last 72 hours PT/INR  Recent Labs  05/05/13 0355  LABPROT 15.7*  INR 1.28    Studies/Results: No results found.  Assessment/Plan: Turp with left ureteral stent 04/10/13 at [place]  PE by VQ scan post procedure, renal failure 04/15/13  Left subcapsular hematoma on IV heparin for PE 04/16/13  IVC filter placed 04/18/13  Bilateral ureteral stents placed 04/19/13  Bilateral leg swelling with extensive DVT last doppler 05/04/13, previous venous doppler 04/20/13 no evidence of DVT. not a good lysis candidate given recent bleed on IV heparin.   Patient has been placed on IV heparin 9/22 given extensive DVT burden and not a lysis candidate.   Dr. Archer Asa has discussed with the primary team possibility of DVT intervention with angiovac, which would require the need for venous bypass and further approval. We will continue to monitor the patient on IV heparin. If renal function continues to improve Cr 1.66 on 9/23 (1.96) consider CT abdomen/pelvis with delayed venogram, if renal function does not improve consider MR venogram without contrast.  We will continue to follow the patient.  LOS: 7 days    Cloretta Ned 05/07/2013 4:22 PM ]

## 2013-05-07 NOTE — Progress Notes (Signed)
Physical Therapy Treatment Patient Details Name: IAM LIPSON MRN: [id number] DOB: Sep 07, 1945 Today's Date: 05/07/2013 Time: 4098-1191 PT Time Calculation (min): 18 min  PT Assessment / Plan / Recommendation  History of Present Illness 19 M underwent TURP and L ureteral stent 8/29. Admitted to [facility] 9/02 with acute PE and anticoagulation initiated. Hospitalization c/b hematuria, progressive rean linsufficiency and anemia requiring transfusion. Bladder irrigations initiated and CT scan abdomen performed 9/03 revealed L renal fluid subcapsular fluid collection thought to represent hematoma. Transferred to [facility] 9/05 for impending need for HD and placement of IVC filter. Currently has a suprapubic catheter with CBI.     PT Comments   Pt with increased amb tolerance this date however demo's significant SOB, dec SpO2 and inc in HR with amb of 20 feet. Awaiting medical management of bilat LE blood clots however anticipate pt to still be able to d/c home with assist of wife and HHPT once medically stable.   Follow Up Recommendations  Home health PT;Supervision/Assistance - 24 hour     Does the patient have the potential to tolerate intense rehabilitation     Barriers to Discharge        Equipment Recommendations  None recommended by PT    Recommendations for Other Services    Frequency Min 3X/week   Progress towards PT Goals Progress towards PT goals: Progressing toward goals  Plan Current plan remains appropriate    Precautions / Restrictions Precautions Precautions: Fall Precaution Comments: may have to go to surgery for blood clots Restrictions Weight Bearing Restrictions: No   Pertinent Vitals/Pain During amb HR into 140s and SpO2 dec to 70s on RA    Mobility  Bed Mobility Bed Mobility: Not assessed Transfers Transfers: Sit to Stand;Stand to Sit Sit to Stand: With upper extremity assist;With armrests;From chair/3-in-1;4: Min assist Stand to Sit: 4: Min guard;With upper  extremity assist;To chair/3-in-1 Details for Transfer Assistance: v/c's for safe hand placement, increased time Ambulation/Gait Ambulation/Gait Assistance: 4: Min guard Ambulation Distance (Feet): 20 Feet (x2 with 5 minute seated rest break) Assistive device: Rolling walker Ambulation/Gait Assistance Details: at approx 20 feet pt reports "I can't lift that L leg anymore. It won't move." Pt with + SOB, SpO2 into 70s at end of walk and HR in 140s. Pt SpO2 inc to 100% s/p 1 min of sitting and HR down to 100s. RN present and aware Gait Pattern: Step-through pattern;Antalgic;Trunk flexed;Wide base of support Gait velocity: slow Stairs: No    Exercises General Exercises - Lower Extremity Ankle Circles/Pumps: AROM;Both;15 reps;Seated Long Arc Quad: AROM;Left;10 reps;Seated (with 2 second hold)   PT Diagnosis:    PT Problem List:   PT Treatment Interventions:     PT Goals (current goals can now be found in the care plan section) Acute Rehab PT Goals Patient Stated Goal: go home with wife  Visit Information  Last PT Received On: 05/07/13 Assistance Needed: +1 (2nd person helpful for chair follow) History of Present Illness: 46 M underwent TURP and L ureteral stent 8/29. Admitted to [facility] 9/02 with acute PE and anticoagulation initiated. Hospitalization c/b hematuria, progressive rean linsufficiency and anemia requiring transfusion. Bladder irrigations initiated and CT scan abdomen performed 9/03 revealed L renal fluid subcapsular fluid collection thought to represent hematoma. Transferred to [facility] 9/05 for impending need for HD and placement of IVC filter. Currently has a suprapubic catheter with CBI.      Subjective Data  Subjective: "they're trying to figure out what to do with these  blood clots." Patient Stated Goal: go home with wife   Cognition  Cognition Arousal/Alertness: Awake/alert Behavior During Therapy: WFL for tasks assessed/performed Overall Cognitive Status: Within  Functional Limits for tasks assessed    Balance     End of Session PT - End of Session Equipment Utilized During Treatment: Gait belt Activity Tolerance: Patient limited by fatigue Patient left: in chair;with call bell/phone within reach;with family/visitor present Nurse Communication: Mobility status   GP     Marcene Brawn 05/07/2013, 12:19 PM  Lewis Shock, PT, DPT Pager #: [phone] Office #: [phone]

## 2013-05-07 NOTE — Progress Notes (Signed)
[facility] Progress Note [place] TEAM 1 - Stepdown/ICU TEAM   Jack [name] ZOX:[id number] DOB: 1946-05-02 DOA: 04/30/2013 PCP: Simone Curia, MD  Admit HPI / Brief Narrative: 67 y.o. male with complicated medical history including a recent stay at [facility] for PE S/P TURP who required subsequent bilateral renal stents for obstruction due to hemorrhagic complications of anticoagulation s/p eventual IVC filter. Patient was seen in the ED at [facility] 9/17 where his work up demonstrated creatinine of 6.0, WBC of 37k, potassium of 5.8, sodium 127, tachycardia, and mild hypotension. VQ scan was repeated which was negative for PE. He was given kayexelate in the ED, 2L NS, and transferred to [facility].  8/29 - TURP w/ L ureteral stent [facility] 9/02 - admit [facility] w/ acute PE >> anticoag >> severe hematuria + anemia + renal failure  9/3 - CT abdom reveals large L renal capsular hematoma 9/5 - transferred to [facility] to PCCM service - noted to have large clot in bladder w/ B hydronephrosis 9/5 - IVC filter placement  9/6 - B ureteral stents placed  9/22 - IV heparin initiated  Assessment/Plan:  Hypotension - resolved  BP stable at this time - felt to be due to hypovolemia   Acute renal failure This was the presenting diagnosis for this admit - crt was 1.85 at time of d/c 9/10 - Nephrology has now signed off - ultrasound and Ct abd/pelvis reviewed and there are no acute findings - renal function improving continuously - most likely simple pre-renal state v/s ATN related to hypotension - crt continues to improve   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 9/5  - given recent severe complications related to bleeding anticoagulation was not felt to be wise at the time of admission - further review of records revealed Dopplers from 9/7 which were negative for DVT - repeat dopplers this hospitalization confirmed extensive bilateral lower extremity DVTs - d/c'ed  testosterone replacement - due to fear of propagation of clot through IVC filter pt is now on IV heparin - no clinical evidence of spontaneous re-cannulation of LE venous system at this time - IR following for possible thrombectomy via vacuum procedure - appreciate consult  Progressive anemia  Likely a combination of significant recent acute loss plus decreased production - the patient has been transfused 2 units packed red blood cells - hemoglobin holding steady on Heparin  Leukocytosis  WBC now normalized - completed a five-day course of empiric antibiotic therapy  Hyperkalemia >> hypokalemia Due to above - replaced and now normalized   S/p transurethral resection of prostate with left ureteroscopic stone manipulation 04/13/13 complicated by perinephric hematoma with bilateral hydronephrosis underwent urgent cystoscopy 04/18/2013 with bilateral stent placement and bladder evacuation with suprapubic tube placement - Urology has seen in f/u   Code Status: FULL Family Communication: Spoke with patient and his wife at bedside  Disposition Plan: SDU  Consultants: Nephrology Urology IR  Procedures: None  Antibiotics: Zosyn 9/16 >> 9/20 Vancomycin 9/16 >>9/18  DVT prophylaxis: IV heparin - IVC filter   HPI/Subjective: Patient is sitting up in a chair.  He ambulate down the hall with therapy today.  Has not noted any decrease in LE edema.  No gross blood loss.  No cp or sob.    Objective: Blood pressure 129/61, pulse 96, temperature 98.4 F (36.9 C), temperature source Oral, resp. rate 27, height 5\' 9"  (1.753 m), weight 130.1 kg (286 lb 13.1 oz), SpO2 99.00%.  Intake/Output Summary (  Last 24 hours) at 05/07/13 1507 Last data filed at 05/07/13 0800  Gross per 24 hour  Intake    844 ml  Output   1125 ml  Net   -281 ml   Exam: General: No acute respiratory distress Lungs: Clear to auscultation bilaterally without wheezes or crackles Cardiovascular: Regular rate and rhythm  without murmur gallop or rub  Abdomen: Obese, nondistended, soft, bowel sounds positive, no rebound, no ascites, no appreciable mass  Extremities: 3+ edema bilateral lower extremities to pelvis without significant change   Data Reviewed: Basic Metabolic Panel:  Recent Labs Lab 05/01/13 0437 05/02/13 0325 05/03/13 0530 05/04/13 0533 05/05/13 0355 05/06/13 0448  NA 126* 129* 126* 129* 130* 132*  K 3.9 3.5 3.1* 3.2* 3.7 3.7  CL 85* 87* 86* 90* 93* 95*  CO2 22 26 24 28 30 27   GLUCOSE 123* 123* 115* 115* 108* 107*  BUN 102* 97* 86* 68* 51* 42*  CREATININE 6.18* 5.05* 3.80* 2.78* 1.96* 1.66*  CALCIUM 7.4* 7.1* 7.4* 7.9* 7.9* 8.1*  PHOS 9.4* 7.9* 5.4* 3.8  --   --    Liver Function Tests:  Recent Labs Lab 05/01/13 0437 05/02/13 0325 05/03/13 0530 05/04/13 0533  ALBUMIN 2.2* 2.1* 2.0* 2.1*   CBC:  Recent Labs Lab 05/01/13 0437 05/02/13 0325  05/04/13 0533 05/05/13 0355 05/05/13 2055 05/06/13 0448 05/07/13 0605  WBC 15.9* 13.3*  < > 9.1 9.2 10.0 8.8 8.0  NEUTROABS 13.5* 10.6*  --   --   --   --   --   --   HGB 7.3* 7.2*  < > 9.0* 9.3* 9.6* 9.3* 9.7*  HCT 20.7* 20.5*  < > 26.5* 27.5* 29.1* 28.9* 29.7*  MCV 82.8 83.3  < > 85.2 86.2 87.4 87.8 88.1  PLT 134* 145*  < > 180 208 260 238 275  < > = values in this interval not displayed. BNP (last 3 results)  Recent Labs  04/18/13 1245 04/27/13 1240  PROBNP 1762.0* 1260.0*    Studies:  Recent x-ray studies have been reviewed in detail by the Attending Physician  Scheduled Meds:  Scheduled Meds: . midodrine  10 mg Oral TID WC  . sodium chloride  3 mL Intravenous Q12H    Time spent on care of this patient: 25 mins   Tekoa Amon T, MD  [facility] Office  [phone] Pager - Text Page per Loretha Stapler as per below:  On-Call/Text Page:      [email or web address]      password [email or web address]  If 7PM-7AM, please contact night-coverage [email or web address] Password [email or web address] 05/07/2013, 3:07 PM   LOS: 7 days

## 2013-05-07 NOTE — Progress Notes (Signed)
Occupational Therapy Treatment Patient Details Name: Jack [name] MRN: [id number] DOB: 1946/06/17 Today's Date: 05/07/2013 Time: 4696-2952 OT Time Calculation (min): 14 min  OT Assessment / Plan / Recommendation  History of present illness 46 M underwent TURP and L ureteral stent 8/29. Admitted to [facility] 9/02 with acute PE and anticoagulation initiated. Hospitalization c/b hematuria, progressive rean linsufficiency and anemia requiring transfusion. Bladder irrigations initiated and CT scan abdomen performed 9/03 revealed L renal fluid subcapsular fluid collection thought to represent hematoma. Transferred to [facility] 9/05 for impending need for HD and placement of IVC filter. Currently has a suprapubic catheter with CBI.     OT comments  Pt making good progress. Educated pt on AE for LB ADL, which increases his safety and independence with ADL. Pt completing BUE and LE ex. Will give AE and theraband tomorrow. Excellent participation. Will continue to follow.  Follow Up Recommendations  Home health OT    Barriers to Discharge       Equipment Recommendations  None recommended by OT    Recommendations for Other Services    Frequency Min 2X/week   Progress towards OT Goals Progress towards OT goals: Progressing toward goals  Plan Discharge plan remains appropriate    Precautions / Restrictions Precautions Precautions: Fall   Pertinent Vitals/Pain no apparent distress     ADL  ADL Comments: Educated pt on available AE for LB ADL. AE will significantly increase his independence and safety with ADL at home.    OT Diagnosis:    OT Problem List:   OT Treatment Interventions:     OT Goals(current goals can now be found in the care plan section) Acute Rehab OT Goals Patient Stated Goal: go home with wife OT Goal Formulation: With patient Time For Goal Achievement: 05/19/13 Potential to Achieve Goals: Good ADL Goals Pt Will Perform Lower Body Bathing: with adaptive equipment;sit  to/from stand;with min assist;with caregiver independent in assisting Pt Will Perform Lower Body Dressing: with min assist;with caregiver independent in assisting;with adaptive equipment;sit to/from stand Pt Will Transfer to Toilet: with supervision;bedside commode;stand pivot transfer Pt Will Perform Toileting - Clothing Manipulation and hygiene: with modified independence;sitting/lateral leans;sit to/from stand Pt/caregiver will Perform Home Exercise Program: Increased strength;Both right and left upper extremity;With theraband  Visit Information  Last OT Received On: 05/07/13 Assistance Needed: +1 History of Present Illness: 9 M underwent TURP and L ureteral stent 8/29. Admitted to [facility] 9/02 with acute PE and anticoagulation initiated. Hospitalization c/b hematuria, progressive rean linsufficiency and anemia requiring transfusion. Bladder irrigations initiated and CT scan abdomen performed 9/03 revealed L renal fluid subcapsular fluid collection thought to represent hematoma. Transferred to [facility] 9/05 for impending need for HD and placement of IVC filter. Currently has a suprapubic catheter with CBI.      Subjective Data      Prior Functioning       Cognition  Cognition Arousal/Alertness: Awake/alert Behavior During Therapy: WFL for tasks assessed/performed Overall Cognitive Status: Within Functional Limits for tasks assessed    Mobility  Transfers Transfers: Sit to Stand;Stand to Sit Sit to Stand: With upper extremity assist;With armrests;From chair/3-in-1;4: Min assist Stand to Sit: 4: Min guard;To chair/3-in-1 Details for Transfer Assistance: good safety awareness    Exercises  Other Exercises Other Exercises: chair push ups Other Exercises: BUE AROM x 100 reps throughout the day Other Exercises: BLE leg lifts x 100 reps throughtout the day   Balance     End of Session OT - End of  Session Activity Tolerance: Patient tolerated treatment well Patient left: in  chair;with call bell/phone within reach;with family/visitor present Nurse Communication: Mobility status  GO     Lillien Petronio,HILLARY 05/07/2013, 4:45 PM [facility], OTR/L  [phone] 05/07/2013

## 2013-05-08 DIAGNOSIS — I82403 Acute embolism and thrombosis of unspecified deep veins of lower extremity, bilateral: Secondary | ICD-10-CM | POA: Diagnosis present

## 2013-05-08 DIAGNOSIS — I82409 Acute embolism and thrombosis of unspecified deep veins of unspecified lower extremity: Secondary | ICD-10-CM

## 2013-05-08 LAB — BASIC METABOLIC PANEL
BUN: 32 mg/dL — ABNORMAL HIGH (ref 6–23)
Creatinine, Ser: 1.54 mg/dL — ABNORMAL HIGH (ref 0.50–1.35)
GFR calc Af Amer: 53 mL/min — ABNORMAL LOW (ref >90)
GFR calc non Af Amer: 45 mL/min — ABNORMAL LOW (ref >90)

## 2013-05-08 LAB — CBC
HCT: 28.9 % — ABNORMAL LOW (ref 39.0–52.0)
Hemoglobin: 9.4 g/dL — ABNORMAL LOW (ref 13.0–17.0)
MCH: 28.7 pg (ref 26.0–34.0)
MCHC: 32.5 g/dL (ref 30.0–36.0)
MCV: 88.4 fL (ref 78.0–100.0)
RDW: 15.4 % (ref 11.5–15.5)

## 2013-05-08 LAB — HEPARIN LEVEL (UNFRACTIONATED): Heparin Unfractionated: 0.59 IU/mL (ref 0.30–0.70)

## 2013-05-08 NOTE — Progress Notes (Signed)
ANTICOAGULATION CONSULT NOTE - Follow-up  Pharmacy Consult for Heparin Indication: Bilateral DVTs, h/o PE, MD concerned about movement of clot through IVC filter  Allergies  Allergen Reactions  . Contrast Media [Iodinated Diagnostic Agents]     Blue dye    Patient Measurements: Height: 5\' 9"  (175.3 cm) Weight: 286 lb 9.6 oz (130 kg) IBW/kg (Calculated) : 70.7 Heparin Dosing Weight: 101 kg  Vital Signs: Temp: 98 F (36.7 C) (09/25 0807) Temp src: Oral (09/25 0807) BP: 127/54 mmHg (09/25 0340) Pulse Rate: 77 (09/25 0340)  Labs:  Recent Labs  05/06/13 0448 05/07/13 0605 05/08/13 0605  HGB 9.3* 9.7* 9.4*  HCT 28.9* 29.7* 28.9*  PLT 238 275 293  HEPARINUNFRC 0.49 0.66 0.59  CREATININE 1.66*  --   --     Estimated Creatinine Clearance: 58.4 ml/min (by C-G formula based on Cr of 1.66).   Medical History: Past Medical History  Diagnosis Date  . HTN (hypertension)   . Ureteral stone   . Colon cancer   . Shortness of breath   . Asthma   . Chronic kidney disease     acuts on chronic renal failure  . Arthritis   . PE (pulmonary embolism) 04/2013    Assessment: 67 y/o M recently DC from hospital with IVC filter for presumed PE (no AC as pt was thought to have perinephric hematoma after resection of prostate and left ureteroscopic stone manipulation. Now with new findings of extensive bilateral DVTs, MD opting to start anticoagulation. Baseline INR is 1.28, aPTT 30, Hgb now fairly stable at 9.4 (was 7.2 on 9/19). Plts 293. Scr 1.66<1.96<2.78 with CrCl ~ 55-60. HL is 0.59<0.66<0.49<0.33.  Goal of Therapy:  Heparin level 0.3-0.7 units/ml Monitor platelets by anticoagulation protocol: Yes   Plan:  -Continue heparin infusion at 1900 units/hr -Daily CBC/HL -Monitor CLOSELY for bleeding, drops in Hgb -F/U MD plans for oral AC -Pt does have IVC in place -IR seeing for possible interventions  Thank you for allowing me to take part in this patient's care,  Abran Duke, PharmD Clinical Pharmacist Phone: [phone] Pager: [phone] 05/08/2013 10:08 AM

## 2013-05-08 NOTE — Progress Notes (Signed)
[facility] Progress Note [place] TEAM 1 - Stepdown/ICU TEAM   JOSEMARIA BRINING [name] DOB: September 05, 1945 DOA: 04/30/2013 PCP: Simone Curia, MD  Admit HPI / Brief Narrative: 67 y.o. male with complicated medical history including a recent stay at [facility] for PE S/P TURP who required subsequent bilateral renal stents for obstruction due to hemorrhagic complications of anticoagulation s/p eventual IVC filter. Patient was seen in the ED at [facility] 9/17 where his work up demonstrated creatinine of 6.0, WBC of 37k, potassium of 5.8, sodium 127, tachycardia, and mild hypotension. VQ scan was repeated which was negative for PE. He was given kayexelate in the ED, 2L NS, and transferred to [facility].  8/29 - TURP w/ L ureteral stent [facility] 9/02 - admit [facility] w/ acute PE >> anticoag >> severe hematuria + anemia + renal failure  9/3 - CT abdom reveals large L renal capsular hematoma 9/5 - transferred to [facility] to PCCM service - noted to have large clot in bladder w/ B hydronephrosis 9/5 - IVC filter placement  9/6 - B ureteral stents placed  9/22 - IV heparin initiated  Assessment/Plan:  Hypotension - resolved  BP stable at this time - felt to be due to hypovolemia   Acute renal failure This was the presenting diagnosis for this admit - crt was 1.85 at time of d/c 9/10 - Nephrology has now signed off - ultrasound and Ct abd/pelvis reviewed and there are no acute findings - renal function improving continuously - most likely simple pre-renal state v/s ATN related to hypotension - crt continues to improve   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 9/5  - given recent severe complications related to bleeding anticoagulation was not felt to be wise at the time of admission - further review of records revealed Dopplers from 9/7 which were negative for DVT - repeat dopplers this hospitalization confirmed extensive bilateral lower extremity DVTs - d/c'ed  testosterone replacement - due to fear of propagation of clot through IVC filter pt is now on IV heparin - no clinical evidence of spontaneous re-cannulation of LE venous system at this time - IR following for possible thrombectomy via vacuum procedure - appreciate consult  Progressive anemia  Likely a combination of significant recent acute loss plus decreased production - the patient has been transfused 2 units packed red blood cells - hemoglobin holding steady on Heparin  Leukocytosis  WBC now normalized - completed a five-day course of empiric antibiotic therapy  Hyperkalemia >> hypokalemia Due to above - replaced and now normalized   S/p transurethral resection of prostate with left ureteroscopic stone manipulation 04/13/13 complicated by perinephric hematoma with bilateral hydronephrosis underwent urgent cystoscopy 04/18/2013 with bilateral stent placement and bladder evacuation with suprapubic tube placement - Urology has seen in f/u   Code Status: FULL Family Communication: Spoke with patient and his wife at bedside  Disposition Plan: SDU  Consultants: Nephrology Urology IR  Procedures: None  Antibiotics: Zosyn 9/16 >> 9/20 Vancomycin 9/16 >>9/18  DVT prophylaxis: IV heparin - IVC filter   HPI/Subjective: Patient is sitting up in a chair.  No change in status. No complaints.   Objective: Blood pressure 115/66, pulse 77, temperature 99 F (37.2 C), temperature source Oral, resp. rate 13, height 5\' 9"  (1.753 m), weight 130 kg (286 lb 9.6 oz), SpO2 89.00%.  Intake/Output Summary (Last 24 hours) at 05/08/13 1722 Last data filed at 05/08/13 1649  Gross per 24 hour  Intake   1157  ml  Output   2076 ml  Net   -919 ml   Exam: General: No acute respiratory distress Lungs: Clear to auscultation bilaterally without wheezes or crackles Cardiovascular: Regular rate and rhythm without murmur gallop or rub  Abdomen: Obese, nondistended, soft, bowel sounds positive, no  rebound, no ascites, no appreciable mass  Extremities: 3+ edema bilateral lower extremities to pelvis without significant change   Data Reviewed: Basic Metabolic Panel:  Recent Labs Lab 05/02/13 0325 05/03/13 0530 05/04/13 0533 05/05/13 0355 05/06/13 0448 05/08/13 0930  NA 129* 126* 129* 130* 132* 135  K 3.5 3.1* 3.2* 3.7 3.7 4.1  CL 87* 86* 90* 93* 95* 98  CO2 26 24 28 30 27 27   GLUCOSE 123* 115* 115* 108* 107* 76  BUN 97* 86* 68* 51* 42* 32*  CREATININE 5.05* 3.80* 2.78* 1.96* 1.66* 1.54*  CALCIUM 7.1* 7.4* 7.9* 7.9* 8.1* 8.5  PHOS 7.9* 5.4* 3.8  --   --   --    Liver Function Tests:  Recent Labs Lab 05/02/13 0325 05/03/13 0530 05/04/13 0533  ALBUMIN 2.1* 2.0* 2.1*   CBC:  Recent Labs Lab 05/02/13 0325  05/05/13 0355 05/05/13 2055 05/06/13 0448 05/07/13 0605 05/08/13 0605  WBC 13.3*  < > 9.2 10.0 8.8 8.0 7.8  NEUTROABS 10.6*  --   --   --   --   --   --   HGB 7.2*  < > 9.3* 9.6* 9.3* 9.7* 9.4*  HCT 20.5*  < > 27.5* 29.1* 28.9* 29.7* 28.9*  MCV 83.3  < > 86.2 87.4 87.8 88.1 88.4  PLT 145*  < > 208 260 238 275 293  < > = values in this interval not displayed. BNP (last 3 results)  Recent Labs  04/18/13 1245 04/27/13 1240  PROBNP 1762.0* 1260.0*    Studies:  Recent x-ray studies have been reviewed in detail by the Attending Physician  Scheduled Meds:  Scheduled Meds: . midodrine  10 mg Oral TID WC  . sodium chloride  3 mL Intravenous Q12H    Time spent on care of this patient: 25 mins   Calvert Cantor, MD  [facility] Office  [phone] Pager - Text Page per Loretha Stapler as per below:  On-Call/Text Page:      [email or web address]      password [email or web address]  If 7PM-7AM, please contact night-coverage [email or web address] Password [email or web address] 05/08/2013, 5:22 PM   LOS: 8 days

## 2013-05-08 NOTE — Progress Notes (Signed)
Physical Therapy Treatment Patient Details Name: Jack [name] MRN: [id number] DOB: 1945/08/22 Today's Date: 05/08/2013 Time: 4098-1191 PT Time Calculation (min): 32 min  PT Assessment / Plan / Recommendation  History of Present Illness 76 M underwent TURP and L ureteral stent 8/29. Admitted to [facility] 9/02 with acute PE and anticoagulation initiated. Hospitalization c/b hematuria, progressive rean linsufficiency and anemia requiring transfusion. Bladder irrigations initiated and CT scan abdomen performed 9/03 revealed L renal fluid subcapsular fluid collection thought to represent hematoma. Transferred to [facility] 9/05 for impending need for HD and placement of IVC filter. Currently has a suprapubic catheter with CBI.     PT Comments   Pt able to  Increase ambulation distance however continues to get SOB with ambulation with increase HR to 140s.  Pt needed 2 seated rest breaks to perform pursed lipped breathing and to allow HR to decrease.    Follow Up Recommendations  Home health PT;Supervision/Assistance - 24 hour     Equipment Recommendations  None recommended by PT    Frequency Min 3X/week   Progress towards PT Goals Progress towards PT goals: Progressing toward goals  Plan Current plan remains appropriate    Precautions / Restrictions Precautions Precautions: Fall   Pertinent Vitals/Pain No c/o pain; HR increase to 140s during mobility    Mobility  Bed Mobility Bed Mobility: Not assessed Transfers Transfers: Sit to Stand;Stand to Sit Sit to Stand: With upper extremity assist;With armrests;From chair/3-in-1;4: Min assist Stand to Sit: 4: Min guard;To chair/3-in-1 Details for Transfer Assistance: (A) to initiate transfer with increase fatigue after several seated rest breaks Ambulation/Gait Ambulation/Gait Assistance: 4: Min guard Ambulation Distance (Feet): 75 Feet (2 seated rest breaks for ~ ) Assistive device: Rolling walker Ambulation/Gait Assistance Details:  Cues for body position within RW and correct RW placement with backwards ambulation to recliner Gait Pattern: Step-through pattern;Antalgic;Trunk flexed;Wide base of support Gait velocity: slow General Gait Details: Pt continues to have SOB during ambulation  Stairs: No Wheelchair Mobility Wheelchair Mobility: No    Exercises General Exercises - Lower Extremity Ankle Circles/Pumps: AROM;Both;15 reps;Seated Long Arc Quad: AROM;Left;10 reps;Seated   PT Diagnosis:    PT Problem List:   PT Treatment Interventions:     PT Goals (current goals can now be found in the care plan section) Acute Rehab PT Goals Patient Stated Goal: go home with wife PT Goal Formulation: With patient Time For Goal Achievement: 05/16/13 Potential to Achieve Goals: Good  Visit Information  Last PT Received On: 05/08/13 Assistance Needed: +1 History of Present Illness: 7 M underwent TURP and L ureteral stent 8/29. Admitted to [facility] 9/02 with acute PE and anticoagulation initiated. Hospitalization c/b hematuria, progressive rean linsufficiency and anemia requiring transfusion. Bladder irrigations initiated and CT scan abdomen performed 9/03 revealed L renal fluid subcapsular fluid collection thought to represent hematoma. Transferred to [facility] 9/05 for impending need for HD and placement of IVC filter. Currently has a suprapubic catheter with CBI.      Subjective Data  Subjective: "I'm going to do the best I can." Patient Stated Goal: go home with wife   Cognition  Cognition Arousal/Alertness: Awake/alert Behavior During Therapy: WFL for tasks assessed/performed Overall Cognitive Status: Within Functional Limits for tasks assessed    Balance     End of Session PT - End of Session Equipment Utilized During Treatment: Gait belt Activity Tolerance: Patient limited by fatigue Patient left: in chair;with call bell/phone within reach;with family/visitor present Nurse Communication: Mobility status  GP     Rashauna Tep 05/08/2013, 2:50 PM  [place], PT DPT [phone]

## 2013-05-09 DIAGNOSIS — K922 Gastrointestinal hemorrhage, unspecified: Secondary | ICD-10-CM

## 2013-05-09 LAB — BASIC METABOLIC PANEL
BUN: 32 mg/dL — ABNORMAL HIGH (ref 6–23)
CO2: 25 mEq/L (ref 19–32)
Calcium: 8.4 mg/dL (ref 8.4–10.5)
Creatinine, Ser: 1.63 mg/dL — ABNORMAL HIGH (ref 0.50–1.35)
GFR calc non Af Amer: 42 mL/min — ABNORMAL LOW (ref >90)
Glucose, Bld: 113 mg/dL — ABNORMAL HIGH (ref 70–99)
Sodium: 135 mEq/L (ref 135–145)

## 2013-05-09 LAB — CBC
HCT: 33.1 % — ABNORMAL LOW (ref 39.0–52.0)
HCT: 29.5 % — ABNORMAL LOW (ref 39.0–52.0)
MCH: 28.3 pg (ref 26.0–34.0)
MCH: 28.7 pg (ref 26.0–34.0)
MCHC: 31.4 g/dL (ref 30.0–36.0)
Platelets: 333 10*3/uL (ref 150–400)
Platelets: 313 10*3/uL (ref 150–400)
RBC: 3.31 MIL/uL — ABNORMAL LOW (ref 4.22–5.81)
RDW: 15.8 % — ABNORMAL HIGH (ref 11.5–15.5)
WBC: 9.3 10*3/uL (ref 4.0–10.5)

## 2013-05-09 MED ORDER — SODIUM CHLORIDE 0.9 % IV SOLN
INTRAVENOUS | Status: AC
  Administered 2013-05-09 – 2013-05-10 (×3): via INTRAVENOUS

## 2013-05-09 MED ORDER — HYDROCORTISONE ACETATE 25 MG RE SUPP
25.0000 mg | Freq: Two times a day (BID) | RECTAL | Status: DC
  Administered 2013-05-09 – 2013-05-11 (×5): 25 mg via RECTAL
  Filled 2013-05-09 (×8): qty 1

## 2013-05-09 NOTE — Progress Notes (Signed)
Occupational Therapy Treatment Patient Details Name: TRASE BUNDA MRN: [id number] DOB: [birth year] Today's Date: 05/09/2013 Time: 3086-5784 OT Time Calculation (min): 29 min  OT Assessment / Plan / Recommendation  History of present illness 5 M underwent TURP and L ureteral stent 8/29. Admitted to [facility] 9/02 with acute PE and anticoagulation initiated. Hospitalization c/b hematuria, progressive rean linsufficiency and anemia requiring transfusion. Bladder irrigations initiated and CT scan abdomen performed 9/03 revealed L renal fluid subcapsular fluid collection thought to represent hematoma. Transferred to [facility] 9/05 for impending need for HD and placement of IVC filter. Currently has a suprapubic catheter with CBI.     OT comments  Pt making good progress, however, c/o SOB today. Using 2L O@ with SATS >95. Pt progressing with ADL - using AE to assist with LB ADL. Educated pt/wife on theraband ex for BUE strengthening and pt agrees to complete independently. Will continue to follow.  Follow Up Recommendations  Home health OT    Barriers to Discharge       Equipment Recommendations  None recommended by OT    Recommendations for Other Services    Frequency Min 2X/week   Progress towards OT Goals Progress towards OT goals: Progressing toward goals  Plan Discharge plan remains appropriate    Precautions / Restrictions Precautions Precautions: Fall Precaution Comments: may have to go to surgery for blood clots Restrictions Weight Bearing Restrictions: No   Pertinent Vitals/Pain no apparent distress     ADL  Lower Body Bathing: Minimal assistance;Other (comment) (using AE) Where Assessed - Lower Body Bathing: Supported sit to stand Lower Body Dressing: Minimal assistance Where Assessed - Lower Body Dressing: Supported sit to stand (using AE) Equipment Used: Rolling walker;Sock aid;Reacher;Long-handled sponge ADL Comments: AE significantl;y    OT Diagnosis:    OT Problem  List:   OT Treatment Interventions:     OT Goals(current goals can now be found in the care plan section) Acute Rehab OT Goals Patient Stated Goal: go home with wife OT Goal Formulation: With patient Time For Goal Achievement: 05/19/13 Potential to Achieve Goals: Good ADL Goals Pt Will Perform Lower Body Bathing: with adaptive equipment;sit to/from stand;with min assist;with caregiver independent in assisting Pt Will Perform Lower Body Dressing: with min assist;with caregiver independent in assisting;with adaptive equipment;sit to/from stand Pt Will Transfer to Toilet: with supervision;bedside commode;stand pivot transfer Pt Will Perform Toileting - Clothing Manipulation and hygiene: with modified independence;sitting/lateral leans;sit to/from stand Pt/caregiver will Perform Home Exercise Program: Increased strength;Both right and left upper extremity;With theraband  Visit Information  Last OT Received On: 05/09/13 Assistance Needed: +1 History of Present Illness: 27 M underwent TURP and L ureteral stent 8/29. Admitted to [facility] 9/02 with acute PE and anticoagulation initiated. Hospitalization c/b hematuria, progressive rean linsufficiency and anemia requiring transfusion. Bladder irrigations initiated and CT scan abdomen performed 9/03 revealed L renal fluid subcapsular fluid collection thought to represent hematoma. Transferred to [facility] 9/05 for impending need for HD and placement of IVC filter. Currently has a suprapubic catheter with CBI.      Subjective Data      Prior Functioning       Cognition  Cognition Arousal/Alertness: Awake/alert Behavior During Therapy: WFL for tasks assessed/performed Overall Cognitive Status: Within Functional Limits for tasks assessed    Mobility  Bed Mobility Bed Mobility: Not assessed Transfers Sit to Stand: With upper extremity assist;With armrests;From chair/3-in-1;4: Min assist Stand to Sit: 4: Min guard;To chair/3-in-1 Details for  Transfer Assistance: (A) to initiate  transfer with increase fatigue after several seated rest breaks.  Pt had one episode while coming up to stand from sitting where his"left knee caught" and he had to sit back down abruptly needing min assist.      Exercises  General Exercises - Lower Extremity Ankle Circles/Pumps: AROM;Both;15 reps;Seated Long Arc Quad: AROM;Left;10 reps;Seated Other Exercises Other Exercises: BUE theraband level 1 ex   Balance Static Sitting Balance Static Sitting - Level of Assistance: Not tested (comment) Static Standing Balance Static Standing - Balance Support: Bilateral upper extremity supported Static Standing - Level of Assistance: 4: Min assist Static Standing - Comment/# of Minutes: 3   End of Session OT - End of Session Activity Tolerance: Patient tolerated treatment well Patient left: in chair;with call bell/phone within reach;with family/visitor present Nurse Communication: Mobility status  GO     Kiam Bransfield,HILLARY 05/09/2013, 1:41 PM [facility], OTR/L  [phone] 05/09/2013

## 2013-05-09 NOTE — Progress Notes (Signed)
Utilization review completed.  

## 2013-05-09 NOTE — Progress Notes (Signed)
Physical Therapy Treatment Patient Details Name: Jack [name] MRN: [id number] DOB: December 06, 1945 Today's Date: 05/09/2013 Time: 1130-1204 PT Time Calculation (min): 34 min  PT Assessment / Plan / Recommendation  History of Present Illness 19 M underwent TURP and L ureteral stent 8/29. Admitted to [facility] 9/02 with acute PE and anticoagulation initiated. Hospitalization c/b hematuria, progressive rean linsufficiency and anemia requiring transfusion. Bladder irrigations initiated and CT scan abdomen performed 9/03 revealed L renal fluid subcapsular fluid collection thought to represent hematoma. Transferred to [facility] 9/05 for impending need for HD and placement of IVC filter. Currently has a suprapubic catheter with CBI.     PT Comments   Pt admitted with above. Pt currently with functional limitations due to continued endurance deficits with need for 2LO2 with ambulation today.   Pt will benefit from skilled PT to increase their independence and safety with mobility to allow discharge to the venue listed below.   Follow Up Recommendations  Home health PT;Supervision/Assistance - 24 hour                 Equipment Recommendations  None recommended by PT        Frequency Min 3X/week   Progress towards PT Goals Progress towards PT goals: Progressing toward goals  Plan Current plan remains appropriate    Precautions / Restrictions Precautions Precautions: Fall Restrictions Weight Bearing Restrictions: No   Pertinent Vitals/Pain VSS with 2LO2, no pain    Mobility  Bed Mobility Bed Mobility: Not assessed Transfers Transfers: Sit to Stand;Stand to Sit Sit to Stand: With upper extremity assist;With armrests;From chair/3-in-1;4: Min assist Stand to Sit: 4: Min guard;To chair/3-in-1 Stand Pivot Transfers: Not tested (comment) Details for Transfer Assistance: (A) to initiate transfer with increase fatigue after several seated rest breaks.  Pt had one episode while coming up to stand from  sitting where his"left knee caught" and he had to sit back down abruptly needing min assist.   Ambulation/Gait Ambulation/Gait Assistance: 4: Min guard Ambulation Distance (Feet): 145 Feet (80 feet then 30 feet and then 35 feet with seated rest break) Assistive device: Rolling walker Ambulation/Gait Assistance Details: cues for body position in RW and correct RW placement.  DOE 3/4 with need for 2L O2 to keep sats >90%.   Gait Pattern: Step-through pattern;Antalgic;Trunk flexed;Wide base of support Gait velocity: slow General Gait Details: Pt continues to have SOB during ambulation  Stairs: No Wheelchair Mobility Wheelchair Mobility: No    Exercises General Exercises - Lower Extremity Ankle Circles/Pumps: AROM;Both;15 reps;Seated Long Arc Quad: AROM;Left;10 reps;Seated   PT Goals (current goals can now be found in the care plan section)    Visit Information  Last PT Received On: 05/09/13 Assistance Needed: +1 (wife follows pt with chair) History of Present Illness: 50 M underwent TURP and L ureteral stent 8/29. Admitted to [facility] 9/02 with acute PE and anticoagulation initiated. Hospitalization c/b hematuria, progressive rean linsufficiency and anemia requiring transfusion. Bladder irrigations initiated and CT scan abdomen performed 9/03 revealed L renal fluid subcapsular fluid collection thought to represent hematoma. Transferred to [facility] 9/05 for impending need for HD and placement of IVC filter. Currently has a suprapubic catheter with CBI.      Subjective Data  Subjective: "My left knee catches sometimes."   Cognition  Cognition Arousal/Alertness: Awake/alert Behavior During Therapy: WFL for tasks assessed/performed Overall Cognitive Status: Within Functional Limits for tasks assessed    Balance  Static Sitting Balance Static Sitting - Level of Assistance: Not tested (comment)  Static Standing Balance Static Standing - Balance Support: Bilateral upper extremity  supported Static Standing - Level of Assistance: 4: Min assist Static Standing - Comment/# of Minutes: 3  End of Session PT - End of Session Equipment Utilized During Treatment: Gait belt Activity Tolerance: Patient limited by fatigue Patient left: in chair;with call bell/phone within reach;with family/visitor present Nurse Communication: Mobility status        INGOLD,Zuleika Gallus 05/09/2013, 1:31 PM  [facility] [phone] (pager)

## 2013-05-09 NOTE — Progress Notes (Signed)
SATURATION QUALIFICATIONS: (This note is used to comply with regulatory documentation for home oxygen)  Patient Saturations on Room Air at Rest = 92%  Patient Saturations on Room Air while Ambulating = 86%  Patient Saturations on 2Liters of oxygen while Ambulating = 98%  Please briefly explain why patient needs home oxygen: Pt currently desats when he does not have O2 on.  Will need home O2.  [facility] [phone] (pager)

## 2013-05-09 NOTE — Progress Notes (Signed)
This note also relates to the following rows which could not be included: Dose (Units/Hr) Heparin  - Cannot attach notes to extension rows Rate Heparin - Cannot attach notes to extension rows Concentration Heparin - Cannot attach notes to extension rows   Pt with red obs in stool MD/N

## 2013-05-09 NOTE — Progress Notes (Addendum)
[facility] Progress Note [place] TEAM 1 - Stepdown/ICU TEAM   KEANTE URIZAR NWG:[id number] DOB: 10/16/1945 DOA: 04/30/2013 PCP: Simone Curia, MD  Admit HPI / Brief Narrative: 67 y.o. male with complicated medical history including a recent stay at [facility] for PE S/P TURP who required subsequent bilateral renal stents for obstruction due to hemorrhagic complications of anticoagulation s/p eventual IVC filter. Patient was seen in the ED at [facility] 9/17 where his work up demonstrated creatinine of 6.0, WBC of 37k, potassium of 5.8, sodium 127, tachycardia, and mild hypotension. VQ scan was repeated which was negative for PE. He was given kayexelate in the ED, 2L NS, and transferred to [facility].  8/29 - TURP w/ L ureteral stent [facility] 9/02 - admit [facility] w/ acute PE >> anticoag >> severe hematuria + anemia + renal failure  9/3 - CT abdom reveals large L renal capsular hematoma 9/5 - transferred to [facility] to PCCM service - noted to have large clot in bladder w/ B hydronephrosis 9/5 - IVC filter placement  9/6 - B ureteral stents placed  9/22 - IV heparin initiated  Assessment/Plan:   Acute renal failure This was the presenting diagnosis for this admit - crt was 1.85 at time of d/c 9/10 - Nephrology has now signed off - ultrasound and Ct abd/pelvis reviewed and there are no acute findings - renal function improving continuously - most likely simple pre-renal state v/s ATN related to hypotension  - crt appears to have stabilized at 1.6-1.8  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 9/5  - given recent severe complications related to bleeding anticoagulation was not felt to be wise at the time of admission - further review of records revealed Dopplers from 9/7 which were negative for DVT - repeat dopplers this hospitalization confirmed extensive bilateral lower extremity DVTs - d/c'ed testosterone replacement - due to fear of propagation of clot through IVC  filter pt is now on IV heparin - no clinical evidence of spontaneous re-cannulation of LE venous system at this time  - IR following for possible thrombectomy via vacuum procedure - appreciate consult - Will start IVF (NS) now also for renal protection  Progressive anemia  Likely a combination of significant recent acute loss plus decreased production - the patient has been transfused 2 units packed red blood cells - hemoglobin holding steady on Heparin despite the bleeding which started yesterday - check H/H Q12  GI bleed Bleeding started yesterday-bright red blood in stools- appears to be from hemorrhoids - he has visible external hemorroids- staff unable to quantify amount of bleeding - cont Heparin, follow Hgb, and start treatment for hemorrhoids.    Leukocytosis  WBC now normalized - completed a five-day course of empiric antibiotic therapy  Hyperkalemia >> hypokalemia Due to above - replaced and now normalized   S/p transurethral resection of prostate with left ureteroscopic stone manipulation 04/13/13 complicated by perinephric hematoma with bilateral hydronephrosis underwent urgent cystoscopy 04/18/2013 with bilateral stent placement and bladder evacuation with suprapubic tube placement - Urology has seen in f/u   Hypotension - resolved  BP stable at this time - felt to be due to hypovolemia   Code Status: FULL Family Communication: Spoke with patient and his wife at bedside  Disposition Plan: SDU  Consultants: Nephrology Urology IR  Procedures: None  Antibiotics: Zosyn 9/16 >> 9/20 Vancomycin 9/16 >>9/18  DVT prophylaxis: IV heparin - IVC filter   HPI/Subjective: Patient is sitting up in a chair.  Able to walk more than yesterday. Doesn't know if he had blood in his stool- I have discussed it with his RN.   Objective: Blood pressure 144/74, pulse 83, temperature 98.2 F (36.8 C), temperature source Oral, resp. rate 26, height 5\' 9"  (1.753 m), weight 130.6 kg  (287 lb 14.7 oz), SpO2 100.00%.  Intake/Output Summary (Last 24 hours) at 05/09/13 1541 Last data filed at 05/09/13 1100  Gross per 24 hour  Intake    664 ml  Output   1252 ml  Net   -588 ml   Exam: General: No acute respiratory distress Lungs: Clear to auscultation bilaterally without wheezes or crackles Cardiovascular: Regular rate and rhythm without murmur gallop or rub  Abdomen: Obese, nondistended, soft, bowel sounds positive, no rebound, no ascites, no appreciable mass  Extremities: 3+ edema bilateral lower extremities to pelvis without significant change R>L  Data Reviewed: Basic Metabolic Panel:  Recent Labs Lab 05/03/13 0530 05/04/13 0533 05/05/13 0355 05/06/13 0448 05/08/13 0930 05/09/13 0400  NA 126* 129* 130* 132* 135 135  K 3.1* 3.2* 3.7 3.7 4.1 4.0  CL 86* 90* 93* 95* 98 98  CO2 24 28 30 27 27 25   GLUCOSE 115* 115* 108* 107* 76 113*  BUN 86* 68* 51* 42* 32* 32*  CREATININE 3.80* 2.78* 1.96* 1.66* 1.54* 1.63*  CALCIUM 7.4* 7.9* 7.9* 8.1* 8.5 8.4  PHOS 5.4* 3.8  --   --   --   --    Liver Function Tests:  Recent Labs Lab 05/03/13 0530 05/04/13 0533  ALBUMIN 2.0* 2.1*   CBC:  Recent Labs Lab 05/05/13 2055 05/06/13 0448 05/07/13 0605 05/08/13 0605 05/09/13 0400  WBC 10.0 8.8 8.0 7.8 9.6  HGB 9.6* 9.3* 9.7* 9.4* 10.4*  HCT 29.1* 28.9* 29.7* 28.9* 33.1*  MCV 87.4 87.8 88.1 88.4 89.9  PLT 260 238 275 293 333   BNP (last 3 results)  Recent Labs  04/18/13 1245 04/27/13 1240  PROBNP 1762.0* 1260.0*    Studies:  Recent x-ray studies have been reviewed in detail by the Attending Physician  Scheduled Meds:  Scheduled Meds: . hydrocortisone  25 mg Rectal BID  . midodrine  10 mg Oral TID WC  . sodium chloride  3 mL Intravenous Q12H    Time spent on care of this patient: 45 mins   Preciliano Castell, MD  [facility] Office  [phone] Pager - Text Page per Loretha Stapler as per below:  On-Call/Text Page:      [email or web address]      password  [email or web address]  If 7PM-7AM, please contact night-coverage [email or web address] Password [email or web address] 05/09/2013, 3:41 PM   LOS: 9 days

## 2013-05-09 NOTE — Progress Notes (Signed)
ANTICOAGULATION CONSULT NOTE - Follow Up Consult  Pharmacy Consult for Heparin Indication: pulmonary embolus & DVT  Allergies  Allergen Reactions  . Contrast Media [Iodinated Diagnostic Agents]     Blue dye    Patient Measurements: Height: 5\' 9"  (175.3 cm) Weight: 287 lb 14.7 oz (130.6 kg) IBW/kg (Calculated) : 70.7 Heparin Dosing Weight:   Vital Signs: Temp: 98.2 F (36.8 C) (09/26 1100) Temp src: Oral (09/26 1100) BP: 124/57 mmHg (09/26 0751) Pulse Rate: 83 (09/26 0751)  Labs:  Recent Labs  05/07/13 0605 05/08/13 0605 05/08/13 0930 05/09/13 0400  HGB 9.7* 9.4*  --  10.4*  HCT 29.7* 28.9*  --  33.1*  PLT 275 293  --  333  HEPARINUNFRC 0.66 0.59  --  0.48  CREATININE  --   --  1.54* 1.63*    Estimated Creatinine Clearance: 59.7 ml/min (by C-G formula based on Cr of 1.63).   Medications:  Scheduled:  . midodrine  10 mg Oral TID WC  . sodium chloride  3 mL Intravenous Q12H    Assessment: 67yo male with presumed PE with IVC filter and now with new extensive BLE DVTs.  Heparin level is therapeutic on 1900 units/hr.  Hg 10.4- up and pltc wnl.  Pt with recent renal hematoma, watching closely signs of bleeding.  Goal of Therapy:  Heparin level 0.3-0.7 units/ml Monitor platelets by anticoagulation protocol: Yes   Plan:  1.  Continue Heparin at current rate 2.  F/U in AM  Marisue Humble, PharmD Clinical Pharmacist [facility]

## 2013-05-09 NOTE — Progress Notes (Signed)
Subjective: This is a 67 year old male with history of a TURP 04/10/13 at [place]; left ureteral stent and double J. The patient developed chest pain and shortness of breath after discharge. The patient was readmitted and a VQ scan revealed high probability of PE and he was started on IV heparin. The patient developed a left subcapsular hematoma, heparin was discontinued and an IVC filter was placed. The patient has since had bilateral leg swelling which upon discussion with family and the patient they feel the weekend of sept 6-7th is when the leg swelling started being noticeable. Lower ext venous duplex performed once leg swelling started was not until 05/04/13 which revealed extensive DVT and he was placed on IV heparin 9/22.   Today he states that he is slowly improving. He states this morning he did develop more shortness of breath and was given some oxygen and has since improved. He states he has been short of breath x 4 years and has had difficulty ambulating in the past given a "bad" left knee. He states his left leg is warm now besides his toes. He denies any numbness to his legs bilaterally. He denies any cold sensation in his right leg. He denies any chest pain. He denies any bleeding, blood in his stool or urine.   Objective: Physical Exam: BP 144/74  Pulse 83  Temp(Src) 98.2 F (36.8 C) (Oral)  Resp 26  Ht 5\' 9"  (1.753 m)  Wt 287 lb 14.7 oz (130.6 kg)  BMI 42.5 kg/m2  SpO2 100%  Abd: (+) BS, no edema palpable.  Extremities: 3+ pitting edema bilaterally to knees. Left > right. No breakdown of skin. Warm to the touch. DP not palpable given edema.  Labs: CBC  Recent Labs  05/08/13 0605 05/09/13 0400  WBC 7.8 9.6  HGB 9.4* 10.4*  HCT 28.9* 33.1*  PLT 293 333   BMET  Recent Labs  05/08/13 0930 05/09/13 0400  NA 135 135  K 4.1 4.0  CL 98 98  CO2 27 25  GLUCOSE 76 113*  BUN 32* 32*  CREATININE 1.54* 1.63*  CALCIUM 8.5 8.4   LFT No results found for this basename:  PROT, ALBUMIN, AST, ALT, ALKPHOS, BILITOT, BILIDIR, IBILI, LIPASE,  in the last 72 hours PT/INR No results found for this basename: LABPROT, INR,  in the last 72 hours   Studies/Results: No results found.  Assessment/Plan: [place] with left ureteral stent 04/10/13 at   PE by VQ scan post procedure, renal failure 04/15/13  Left subcapsular hematoma on IV heparin for PE 04/16/13  IVC filter placed 04/18/13  Bilateral ureteral stents placed 04/19/13  Bilateral leg swelling starting 04/19/13 with extensive DVT last doppler 05/04/13, previous venous doppler 04/20/13 no evidence of DVT. not a good lysis candidate given recent bleed on IV heparin.   Patient has been placed on IV heparin 9/22 given extensive DVT burden and not a lysis candidate.   Dr. Archer Asa has discussed the risks and benefits of possible procedures that could be performed for the patient's bilateral DVT with the patient and the family. However, given the window of > 2 weeks since leg swelling has started on 9/6th weekend and unaware of the extent of the clot or location burden into the IVC, we would recommend at this time further imaging to fully evaluate the clot and location. Cr 1.63 consider CT abdomen/pelvis with delayed venogram or consider MR venogram without contrast. This has been d/w Dr. Butler Denmark over the phone today.  LOS: 9 days    Cloretta Ned 05/09/2013 2:33 PM

## 2013-05-10 ENCOUNTER — Inpatient Hospital Stay (HOSPITAL_COMMUNITY): Payer: PRIVATE HEALTH INSURANCE

## 2013-05-10 DIAGNOSIS — I2699 Other pulmonary embolism without acute cor pulmonale: Secondary | ICD-10-CM | POA: Diagnosis present

## 2013-05-10 LAB — CBC
HCT: 29.2 % — ABNORMAL LOW (ref 39.0–52.0)
MCH: 28.5 pg (ref 26.0–34.0)
Platelets: 303 10*3/uL (ref 150–400)
RDW: 15.9 % — ABNORMAL HIGH (ref 11.5–15.5)
WBC: 9.2 10*3/uL (ref 4.0–10.5)

## 2013-05-10 LAB — BASIC METABOLIC PANEL
Chloride: 101 mEq/L (ref 96–112)
GFR calc Af Amer: 53 mL/min — ABNORMAL LOW (ref >90)
GFR calc non Af Amer: 46 mL/min — ABNORMAL LOW (ref >90)
Glucose, Bld: 114 mg/dL — ABNORMAL HIGH (ref 70–99)
Potassium: 4.6 mEq/L (ref 3.5–5.1)
Sodium: 134 mEq/L — ABNORMAL LOW (ref 135–145)

## 2013-05-10 LAB — HEPARIN LEVEL (UNFRACTIONATED)
Heparin Unfractionated: 0.28 IU/mL — ABNORMAL LOW (ref 0.30–0.70)
Heparin Unfractionated: 0.5 IU/mL (ref 0.30–0.70)
Heparin Unfractionated: 0.54 IU/mL (ref 0.30–0.70)

## 2013-05-10 MED ORDER — IOHEXOL 350 MG/ML SOLN
125.0000 mL | Freq: Once | INTRAVENOUS | Status: AC | PRN
  Administered 2013-05-10: 100 mL via INTRAVENOUS

## 2013-05-10 MED ORDER — METHYLPREDNISOLONE SODIUM SUCC 125 MG IJ SOLR
125.0000 mg | Freq: Once | INTRAMUSCULAR | Status: AC
  Administered 2013-05-10: 125 mg via INTRAVENOUS
  Filled 2013-05-10: qty 2

## 2013-05-10 MED ORDER — DIPHENHYDRAMINE HCL 50 MG PO CAPS
50.0000 mg | ORAL_CAPSULE | Freq: Once | ORAL | Status: AC
  Administered 2013-05-10: 50 mg via ORAL
  Filled 2013-05-10: qty 1

## 2013-05-10 MED ORDER — SODIUM CHLORIDE 0.9 % IV SOLN
INTRAVENOUS | Status: DC
  Administered 2013-05-18: 22:00:00 via INTRAVENOUS
  Administered 2013-05-20: 10 mL via INTRAVENOUS

## 2013-05-10 NOTE — Progress Notes (Signed)
Notified Dr. Butler Denmark of pt's CT abd/pelvis results. Jack [name], Jack [name]

## 2013-05-10 NOTE — Progress Notes (Signed)
ANTICOAGULATION CONSULT NOTE - Follow Up Consult  Pharmacy Consult for Heparin Indication: pulmonary embolus, DVT, and extensive IVC thrombus  Allergies  Allergen Reactions  . Contrast Media [Iodinated Diagnostic Agents]     On 04/29/2013 spoke with patient he had some type of procedure at [facility] and broke out in hives after the exams in 1997, he did not know what of type of study he had.  I called [facility] Radiology department the only type of contrast they used during that time  was Conray    Patient Measurements: Height: 5\' 9"  (175.3 cm) Weight: 290 lb 2 oz (131.6 kg) IBW/kg (Calculated) : 70.7 Heparin Dosing Weight: 100 kg  Vital Signs: Temp: 97.6 F (36.4 C) (09/27 1952) Temp src: Oral (09/27 1952) BP: 122/54 mmHg (09/27 1952) Pulse Rate: 78 (09/27 1952)  Labs:  Recent Labs  05/08/13 0930 05/09/13 0400 05/09/13 1653 05/10/13 0445 05/10/13 1315 05/10/13 1851  HGB  --  10.4* 9.5* 9.4*  --   --   HCT  --  33.1* 29.5* 29.2*  --   --   PLT  --  333 313 303  --   --   HEPARINUNFRC  --  0.48  --  0.28* 0.50 0.54  CREATININE 1.54* 1.63*  --  1.52*  --   --     Estimated Creatinine Clearance: 64.3 ml/min (by C-G formula based on Cr of 1.52).   Medications:  Scheduled:  . hydrocortisone  25 mg Rectal BID  . midodrine  10 mg Oral TID WC  . sodium chloride  3 mL Intravenous Q12H    Assessment: 67 yo male with presumed PE and extensive BLE DVTs.  Noted new CT results showing extensive IVC thrombus as well.  Heparin level remains therapeutic on 2000 units/hr.  Hg is stable at 9.4 and pltc is wnl.  No bleeding problems noted.    Goal of Therapy:  Heparin level 0.3-0.7 units/ml Monitor platelets by anticoagulation protocol: Yes   Plan:  1.  Continue Heparin 2000 units/hr 2.  Heparin level and CBC in AM 3.  Follow up plans for long term anticoagulation  [employer], Pharm.D., BCPS Clinical Pharmacist Pager [phone] 05/10/2013 8:42 PM

## 2013-05-10 NOTE — Progress Notes (Addendum)
[facility] Progress Note [place] TEAM 1 - Stepdown/ICU TEAM   Jack [name] ZOX:[id number] DOB: May 24, 1946 DOA: 04/30/2013 PCP: Simone Curia, MD  Admit HPI / Brief Narrative: 67 y.o. male with complicated medical history including a recent stay at [facility] for PE S/P TURP who required subsequent bilateral renal stents for obstruction due to hemorrhagic complications of anticoagulation s/p eventual IVC filter. Patient was seen in the ED at [facility] 9/17 where his work up demonstrated creatinine of 6.0, WBC of 37k, potassium of 5.8, sodium 127, tachycardia, and mild hypotension. VQ scan was repeated which was negative for PE. He was given kayexelate in the ED, 2L NS, and transferred to [facility].  8/29 - TURP w/ L ureteral stent [employer] 9/02 - admit [facility] w/ V/Q "high probability" for PE >> anticoag >> severe hematuria + anemia + renal failure  9/3 - CT abdom reveals large L renal capsular hematoma 9/5 - transferred to [facility] to PCCM service - noted to have large clot in bladder w/ B hydronephrosis 9/5 - IVC filter placement  9/6 - B ureteral stents placed - open evacuation of clot from bladder- suprapubic tube placed 9/7- venous duplex lower extremity - NO DVT 9/21-venous duplex lower extremity- Bilateral: Positive for DVT in the common femoral, profunda, femoral, popliteal, and posterior tibial veins with superficial thrombus in the greater and lesser saphenous veins. 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 filter 9/5  - given recent severe complications related to bleeding anticoagulation was not felt to be wise at the time of admission - further review of records revealed Dopplers from 9/7 which were negative for DVT - repeat dopplers this hospitalization confirmed extensive bilateral lower extremity DVTs - d/c'ed testosterone replacement - due to fear of propagation of clot through IVC filter pt is now on IV heparin - no clinical evidence of spontaneous re-cannulation of LE venous system at this time  - IR following for possible thrombectomy via vacuum procedure - appreciate consult- will need venogram- can give sodium Bicarb infusion starting 1 hr prior to, during and 6hr after the venogram.  -on continuous IVF (NS) started yesterday for renal protection  Progressive anemia  Likely a combination of significant recent acute loss plus decreased production - the patient has been transfused 2 units packed red blood cells - hemoglobin holding steady on Heparin despite the rectal bleeding  - Hgb stable- will NOT draw labs tomorrow  Hemorrhoidal GI bleed Bleeding started 9/25 -bright red blood streaking in stools- appears to be from hemorrhoids - he has visible external hemorrhoids- no pain - cont Heparin,  Hgb stable -  started treatment for hemorrhoids.   Leukocytosis  WBC now normalized - completed a five-day course of empiric antibiotic therapy  Hyperkalemia >> hypokalemia Due to above - replaced and now normalized   S/p transurethral resection of prostate with left ureteroscopic stone manipulation 04/13/13 complicated by perinephric hematoma with bilateral hydronephrosis underwent urgent cystoscopy 04/18/2013 with bilateral stent placement and  bladder evacuation with suprapubic tube placement - Urology has seen in f/u   Hypotension - resolved  BP stable at this time - felt to be due to hypovolemia   Code Status: FULL Family Communication: Spoke with  patient and his daughter at bedside  Disposition Plan: SDU  Consultants: Nephrology Urology IR  Procedures: None  Antibiotics: Zosyn 9/16 >> 9/20 Vancomycin 9/16 >>9/18  DVT prophylaxis: IV heparin - IVC filter   HPI/Subjective: Patient had a BM this AM- I saw it myself- only a streak of blood- he has no compliants at all.   Objective: Blood pressure 101/60, pulse 95, temperature 99.1 F (37.3 C), temperature source Oral, resp. rate 21, height 5\' 9"  (1.753 m), weight 131.6 kg (290 lb 2 oz), SpO2 99.00%.  Intake/Output Summary (Last 24 hours) at 05/10/13 0924 Last data filed at 05/10/13 0800  Gross per 24 hour  Intake   1711 ml  Output   1300 ml  Net    411 ml   Exam: General: No acute respiratory distress Lungs: Clear to auscultation bilaterally without wheezes or crackles Cardiovascular: Regular rate and rhythm without murmur gallop or rub  Abdomen: Obese, nondistended, soft, bowel sounds positive, no rebound, no ascites, no appreciable mass  Extremities: 3+ edema bilateral lower extremities to pelvis without significant change R>L  Data Reviewed: Basic Metabolic Panel:  Recent Labs Lab 05/04/13 0533 05/05/13 0355 05/06/13 0448 05/08/13 0930 05/09/13 0400 05/10/13 0445  NA 129* 130* 132* 135 135 134*  K 3.2* 3.7 3.7 4.1 4.0 4.6  CL 90* 93* 95* 98 98 101  CO2 28 30 27 27 25 23   GLUCOSE 115* 108* 107* 76 113* 114*  BUN 68* 51* 42* 32* 32* 29*  CREATININE 2.78* 1.96* 1.66* 1.54* 1.63* 1.52*  CALCIUM 7.9* 7.9* 8.1* 8.5 8.4 8.2*  PHOS 3.8  --   --   --   --   --    Liver Function Tests:  Recent Labs Lab 05/04/13 0533  ALBUMIN 2.1*   CBC:  Recent Labs Lab 05/07/13 0605 05/08/13 0605 05/09/13 0400 05/09/13 1653 05/10/13 0445  WBC 8.0 7.8 9.6 9.3 9.2  HGB 9.7* 9.4* 10.4* 9.5* 9.4*  HCT 29.7* 28.9* 33.1* 29.5* 29.2*  MCV 88.1 88.4 89.9 89.1 88.5  PLT 275 293 333 313 303   BNP (last 3 results)  Recent Labs  04/18/13 1245 04/27/13 1240   PROBNP 1762.0* 1260.0*    Studies:  Recent x-ray studies have been reviewed in detail by the Attending Physician  Scheduled Meds:  Scheduled Meds: . hydrocortisone  25 mg Rectal BID  . midodrine  10 mg Oral TID WC  . sodium chloride  3 mL Intravenous Q12H    Time spent on care of this patient: 35 mins   Nadeen Shipman, MD  [facility] Office  [phone] Pager - Text Page per Loretha Stapler as per below:  On-Call/Text Page:      [email or web address]      password [email or web address]  If 7PM-7AM, please contact night-coverage [email or web address] Password [email or web address] 05/10/2013, 9:24 AM   LOS: 10 days

## 2013-05-10 NOTE — Progress Notes (Signed)
ANTICOAGULATION CONSULT NOTE - Follow Up Consult  Pharmacy Consult for heparin Indication: pulmonary embolus and DVT  Labs:  Recent Labs  05/08/13 0605 05/08/13 0930 05/09/13 0400 05/09/13 1653 05/10/13 0445  HGB 9.4*  --  10.4* 9.5* 9.4*  HCT 28.9*  --  33.1* 29.5* 29.2*  PLT 293  --  333 313 303  HEPARINUNFRC 0.59  --  0.48  --  0.28*  CREATININE  --  1.54* 1.63*  --  1.52*    Assessment: 67yo male now slightly subtherapeutic on heparin after being fairly stable at current rate, RN reports no gtt issues overnight.  Goal of Therapy:  Heparin level 0.3-0.7 units/ml   Plan:  Will increase heparin gtt slightly to 2000 units/hr and check level in 6hr.  Vernard Gambles, PharmD, BCPS  05/10/2013,5:47 AM

## 2013-05-10 NOTE — Progress Notes (Signed)
ANTICOAGULATION CONSULT NOTE - Follow Up Consult  Pharmacy Consult for Heparin Indication: pulmonary embolus and DVT  Allergies  Allergen Reactions  . Contrast Media [Iodinated Diagnostic Agents]     On 04/29/2013 spoke with patient he had some type of procedure at [facility] and broke out in hives after the exams in 1997, he did not know what of type of study he had.  I called [facility] Radiology department the only type of contrast they used during that time  was Conray    Patient Measurements: Height: 5\' 9"  (175.3 cm) Weight: 290 lb 2 oz (131.6 kg) IBW/kg (Calculated) : 70.7 Heparin Dosing Weight:   Vital Signs: Temp: 98.7 F (37.1 C) (09/27 1155) Temp src: Oral (09/27 1155) BP: 122/66 mmHg (09/27 1155) Pulse Rate: 88 (09/27 1155)  Labs:  Recent Labs  05/08/13 0930 05/09/13 0400 05/09/13 1653 05/10/13 0445 05/10/13 1315  HGB  --  10.4* 9.5* 9.4*  --   HCT  --  33.1* 29.5* 29.2*  --   PLT  --  333 313 303  --   HEPARINUNFRC  --  0.48  --  0.28* 0.50  CREATININE 1.54* 1.63*  --  1.52*  --     Estimated Creatinine Clearance: 64.3 ml/min (by C-G formula based on Cr of 1.52).   Medications:  Scheduled:  . hydrocortisone  25 mg Rectal BID  . midodrine  10 mg Oral TID WC  . sodium chloride  3 mL Intravenous Q12H    Assessment: 67yo male with presumed PE and extensive BLE DVTs.  Heparin level is therapeutic on 2000 units/hr.  Hg is stable at 9.4 and pltc is wnl.  No bleeding problems noted.    Goal of Therapy:  Heparin level 0.3-0.7 units/ml Monitor platelets by anticoagulation protocol: Yes   Plan:  1.  Continue Heparin 2000 units/hr 2.  Check Heparin level in 6hr to verify therapeutic 3.  F/U in AM  Marisue Humble, PharmD Clinical Pharmacist [facility]

## 2013-05-11 ENCOUNTER — Inpatient Hospital Stay (HOSPITAL_COMMUNITY): Payer: PRIVATE HEALTH INSURANCE

## 2013-05-11 LAB — FIBRINOGEN: Fibrinogen: 525 mg/dL — ABNORMAL HIGH (ref 204–475)

## 2013-05-11 LAB — APTT: aPTT: 78 seconds — ABNORMAL HIGH (ref 24–37)

## 2013-05-11 LAB — MRSA PCR SCREENING: MRSA by PCR: NEGATIVE

## 2013-05-11 LAB — GLUCOSE, CAPILLARY: Glucose-Capillary: 103 mg/dL — ABNORMAL HIGH (ref 70–99)

## 2013-05-11 LAB — HEPARIN LEVEL (UNFRACTIONATED): Heparin Unfractionated: 0.5 IU/mL (ref 0.30–0.70)

## 2013-05-11 MED ORDER — SODIUM CHLORIDE 0.9 % IJ SOLN
3.0000 mL | Freq: Two times a day (BID) | INTRAMUSCULAR | Status: DC
  Administered 2013-05-11 – 2013-05-17 (×9): 3 mL via INTRAVENOUS
  Administered 2013-05-18: 22:00:00 via INTRAVENOUS
  Administered 2013-05-18 – 2013-05-20 (×2): 3 mL via INTRAVENOUS

## 2013-05-11 MED ORDER — TENECTEPLASE 50 MG IV KIT: 0.2500 mg/h | PACK | INTRAVENOUS | Status: DC

## 2013-05-11 MED ORDER — ONDANSETRON HCL 4 MG/2ML IJ SOLN: 4.0000 mg | Freq: Four times a day (QID) | INTRAMUSCULAR | Status: DC | PRN

## 2013-05-11 MED ORDER — IOHEXOL 300 MG/ML  SOLN
150.0000 mL | Freq: Once | INTRAMUSCULAR | Status: AC | PRN
  Administered 2013-05-11: 40 mL via INTRAVENOUS

## 2013-05-11 MED ORDER — MIDAZOLAM HCL 2 MG/2ML IJ SOLN
INTRAMUSCULAR | Status: DC | PRN
  Administered 2013-05-11 (×2): 1 mg via INTRAVENOUS
  Administered 2013-05-11: 0.5 mg via INTRAVENOUS
  Administered 2013-05-11: 1 mg via INTRAVENOUS
  Administered 2013-05-11: 0.5 mg via INTRAVENOUS

## 2013-05-11 MED ORDER — SODIUM CHLORIDE 0.9 % IV SOLN
Freq: Once | INTRAVENOUS | Status: DC
  Filled 2013-05-11: qty 250

## 2013-05-11 MED ORDER — HEPARIN (PORCINE) IN NACL 100-0.45 UNIT/ML-% IJ SOLN: 800.0000 [IU]/h | INTRAMUSCULAR | Status: DC

## 2013-05-11 MED ORDER — MORPHINE SULFATE 4 MG/ML IJ SOLN
5.0000 mg | INTRAMUSCULAR | Status: DC | PRN
  Administered 2013-05-16: 4 mg via INTRAVENOUS
  Administered 2013-05-17: 5 mg via INTRAVENOUS
  Administered 2013-05-17 (×3): 4 mg via INTRAVENOUS
  Administered 2013-05-17 – 2013-05-23 (×6): 5 mg via INTRAVENOUS
  Filled 2013-05-11 (×10): qty 2

## 2013-05-11 MED ORDER — TENECTEPLASE 50 MG IV KIT
0.2500 mg/h | PACK | INTRAVENOUS | Status: DC
  Filled 2013-05-11 (×2): qty 0.5

## 2013-05-11 MED ORDER — SODIUM CHLORIDE 0.9 % IV SOLN
250.0000 mL | INTRAVENOUS | Status: DC | PRN
  Administered 2013-05-11: 250 mL via INTRAVENOUS
  Administered 2013-05-17: 10:00:00 via INTRAVENOUS

## 2013-05-11 MED ORDER — MIDAZOLAM HCL 2 MG/2ML IJ SOLN: 1.0000 mg | INTRAMUSCULAR | Status: DC | PRN

## 2013-05-11 MED ORDER — HEPARIN (PORCINE) IN NACL 2-0.9 UNIT/ML-% IJ SOLN
INTRAMUSCULAR | Status: DC
  Administered 2013-05-11: 18:00:00 via INTRAVENOUS

## 2013-05-11 MED ORDER — DIPHENHYDRAMINE HCL 50 MG/ML IJ SOLN
INTRAMUSCULAR | Status: AC | PRN
  Administered 2013-05-11: 50 mg via INTRAVENOUS

## 2013-05-11 MED ORDER — SODIUM CHLORIDE 0.9 % IJ SOLN: 3.0000 mL | INTRAMUSCULAR | Status: DC | PRN

## 2013-05-11 MED ORDER — FENTANYL CITRATE 0.05 MG/ML IJ SOLN
INTRAMUSCULAR | Status: AC | PRN
  Administered 2013-05-11: 25 ug via INTRAVENOUS
  Administered 2013-05-11: 50 ug via INTRAVENOUS
  Administered 2013-05-11: 25 ug via INTRAVENOUS
  Administered 2013-05-12 (×3): 50 ug via INTRAVENOUS
  Administered 2013-05-12 (×2): 25 ug via INTRAVENOUS

## 2013-05-11 MED ORDER — TENECTEPLASE 50 MG IV KIT
0.2500 mg/h | PACK | INTRAVENOUS | Status: DC
  Administered 2013-05-12: 0.5 mg/h
  Filled 2013-05-11 (×3): qty 0.5

## 2013-05-11 MED ORDER — ALTEPLASE 100 MG IV SOLR: 12.0000 mg | Freq: Once | INTRAVENOUS | Status: DC

## 2013-05-11 NOTE — Progress Notes (Signed)
[facility] Progress Note [place] TEAM 1 - Stepdown/ICU TEAM   Jack [name] RUE:[id number] DOB: 10-02-1945 DOA: 04/30/2013 PCP: Simone Curia, MD  Admit HPI / Brief Narrative: 67 y.o. male with complicated medical history including a recent stay at [facility] for PE S/P TURP who required subsequent bilateral renal stents for obstruction due to hemorrhagic complications of anticoagulation s/p eventual IVC filter. Patient was seen in the ED at [facility] 9/17 where his work up demonstrated creatinine of 6.0, WBC of 37k, potassium of 5.8, sodium 127, tachycardia, and mild hypotension. VQ scan was repeated which was negative for PE. He was given kayexelate in the ED, 2L NS, and transferred to [facility].  8/29 - TURP w/ L ureteral stent [facility] 9/02 - admit [facility] w/ V/Q "high probability" for PE >> anticoag >> severe hematuria + anemia + renal failure  9/3 - CT abdom reveals large L renal capsular hematoma 9/5 - transferred to [facility] to PCCM service - noted to have large clot in bladder w/ B hydronephrosis 9/5 - IVC filter placement  9/6 - B ureteral stents placed - open evacuation of clot from bladder- suprapubic tube placed 9/7- venous duplex lower extremity - NO DVT 9/21-venous duplex lower extremity- Bilateral: Positive for DVT in the common femoral, profunda, femoral, popliteal, and posterior tibial veins with superficial thrombus in the greater and lesser saphenous veins. 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 filter 9/5  - given recent severe complications related to bleeding anticoagulation was not felt to be wise at the time of admission - further review of records revealed Dopplers from 9/7 which were negative for DVT - repeat dopplers this hospitalization confirmed extensive bilateral lower extremity DVTs - d/c'ed testosterone replacement - due to fear of propagation of clot through IVC filter pt is now on IV heparin - no clinical evidence of spontaneous re-cannulation of LE venous system at this time  - venogram reveals clot b/l lower ext veins extending into in IVC and involving the filter - Will have thrombolysis today by IR - tx to ICU for  monitoring  Progressive anemia  Likely a combination of significant recent acute loss plus decreased production - the patient has been transfused 2 units packed red blood cells - hemoglobin holding steady on Heparin despite the rectal bleeding  - Hgb stable- follow closely on TPA  Hemorrhoidal GI bleed Bleeding started 9/25 -bright red blood streaking in stools- appears to be from hemorrhoids - he has visible external hemorrhoids- no pain -  Hgb stable -  started treatment for hemorrhoids.   Leukocytosis  WBC now normalized - completed a five-day course of empiric antibiotic therapy  Hyperkalemia >> hypokalemia Due to above - replaced and now normalized   S/p transurethral resection of prostate with left ureteroscopic stone manipulation 04/13/13 complicated by perinephric hematoma with bilateral hydronephrosis underwent urgent cystoscopy 04/18/2013 with bilateral stent placement and bladder evacuation with suprapubic tube placement - Urology has seen in f/u  Hypotension - resolved  BP stable at this time - felt to be due to hypovolemia   Code Status: FULL Family Communication: Spoke with patient and his daughter at bedside  Disposition Plan:  SDU  Consultants: Nephrology Urology IR  Procedures: None  Antibiotics: Zosyn 9/16 >> 9/20 Vancomycin 9/16 >>9/18  DVT prophylaxis: IV heparin - IVC filter   HPI/Subjective: Pt feels anxious about extent of clot in his body and repeats that something has to be done about it. Small amount of blood noted streaking with stools yesterday.   Objective: Blood pressure 130/53, pulse 70, temperature 97.3 F (36.3 C), temperature source Oral, resp. rate 22, height 5\' 9"  (1.753 m), weight 132 kg (291 lb 0.1 oz), SpO2 99.00%.  Intake/Output Summary (Last 24 hours) at 05/11/13 1228 Last data filed at 05/11/13 1100  Gross per 24 hour  Intake   1706 ml  Output   2000 ml  Net   -294 ml   Exam: General: No acute respiratory distress Lungs: Clear to auscultation bilaterally without wheezes or crackles Cardiovascular: Regular rate and rhythm without murmur gallop or rub  Abdomen: Obese, nondistended, soft, bowel sounds positive, no rebound, no ascites, no appreciable mass  Extremities: 3+ edema bilateral lower extremities to pelvis without significant change R>L  Data Reviewed: Basic Metabolic Panel:  Recent Labs Lab 05/05/13 0355 05/06/13 0448 05/08/13 0930 05/09/13 0400 05/10/13 0445  NA 130* 132* 135 135 134*  K 3.7 3.7 4.1 4.0 4.6  CL 93* 95* 98 98 101  CO2 30 27 27 25 23   GLUCOSE 108* 107* 76 113* 114*  BUN 51* 42* 32* 32* 29*  CREATININE 1.96* 1.66* 1.54* 1.63* 1.52*  CALCIUM 7.9* 8.1* 8.5 8.4 8.2*   Liver Function Tests: No results found for this basename: AST, ALT, ALKPHOS, BILITOT, PROT, ALBUMIN,  in the last 168 hours CBC:  Recent Labs Lab 05/07/13 0605 05/08/13 0605 05/09/13 0400 05/09/13 1653 05/10/13 0445  WBC 8.0 7.8 9.6 9.3 9.2  HGB 9.7* 9.4* 10.4* 9.5* 9.4*  HCT 29.7* 28.9* 33.1* 29.5* 29.2*  MCV 88.1 88.4 89.9 89.1 88.5  PLT 275 293 333 313 303   BNP (last 3 results)  Recent Labs  04/18/13 1245 04/27/13 1240  PROBNP 1762.0* 1260.0*     Studies:  Recent x-ray studies have been reviewed in detail by the Attending Physician  Scheduled Meds:  Scheduled Meds: . hydrocortisone  25 mg Rectal BID  . midodrine  10 mg Oral TID WC  . sodium chloride  3 mL Intravenous Q12H    Time spent on care of this patient: 35 mins   Shaneca Orne, MD  [facility] Office  [phone] Pager - Text Page per Loretha Stapler as per below:  On-Call/Text Page:      [email or web address]      password [email or web address]  If 7PM-7AM, please contact night-coverage [email or web address] Password [facility] 05/11/2013, 12:28 PM   LOS: 11 days

## 2013-05-11 NOTE — Procedures (Signed)
Interventional Radiology Procedure Note  Procedure: Bilateral popliteal venous access with initiation of bilateral LE and IVC thrombolysis. Access: Bilateral popliteal vein, 55F sheaths  Complications: None Recommendations: - [place]se overnight at 0.25 mg/hr TNK per catheter (o.5 mg/hr total) - Q6 hr CBC, Fibrinogen - Watch closely for signs of bleeding and volume overload - Will recheck tomorrow in IR, possible continuation of lysis vs. Adjunct thrombectomy/PTA/stenting - Sips and chips only until 0800 then NPO  Signed,  Sterling Big, MD [facility] Radiology

## 2013-05-11 NOTE — Progress Notes (Signed)
Discussed at length with patient and family.   Pt's clot is likely between 2-3 weeks in age and so there is at least some chance we can debulk the thrombus burden enough to restore antegrade flow.   He has several complicating features including recent surgery 3-4 weeks previously complicated by left perinephric hematoma and bladder hemorrhage and acute renal failure which he is just recovering from.  Unfortunately, his response to conservative anticoagulation with heparin has plateaued and he continues to have massive bilateral LE edema limiting his ability to ambulate.    After discussing the risks, benefits and alternatives, he has decided to proceed with a conservative bilateral venous lysis procedure.  We will have a low threshold to stop lysis at any sign of recurrent bleeding and limit the amount of IV contrast used as much as possible.  He has a questionable history of IV contrast allergy (hives/rash) from 1999.  He did vein with IV contrast yesterday following IV benadryl and solumedrol.  I do not think he requires further steroid.  Benadryl should be sufficient.  Signed,  Sterling Big, MD [facility] Radiology

## 2013-05-11 NOTE — Progress Notes (Signed)
Patient ID: Jack [name], male   DOB: 1945-08-25, 67 y.o.   MRN: [id number] Pt familiar to our service and most recently seen on 05/09/13 for consideration of thrombolytic therapy of IVC/bialteral LE DVT ( see note for additional details). Latest imaging studies were reviewed by Dr. Archer Asa and case d/w Dr. Butler Denmark. Pt not progressing at satisfactory pace with regards to LE edema resolution, limiting his mobility and quality of life. Following extensive discussion of benefits/risks of thrombolytic therapy pt/family now wish to proceed with intervention. On exam today, pt is awake/alert; chest- CTA bilat., heart- RRR; abd-obese,soft+BS,NT; ext- 3+ bilat LE pitting edema, intact DP pulses.   Filed Vitals:   05/11/13 0700 05/11/13 0710 05/11/13 0715 05/11/13 0725  BP:   113/42   Pulse:  44 63 41  Temp: 97.6 F (36.4 C)     TempSrc: Oral     Resp:  18 19 18   Height:      Weight:      SpO2:  98% 100% 98%   Past Medical History  Diagnosis Date  . HTN (hypertension)   . Ureteral stone   . Colon cancer   . Shortness of breath   . Asthma   . Chronic kidney disease     acuts on chronic renal failure  . Arthritis   . PE (pulmonary embolism) 04/2013   Past Surgical History  Procedure Laterality Date  . Ureteral stent placement  04/10/2013  . Transurethral resection of prostate  04/10/2013  . Right colectomy  1996  . Tonsillectomy    . Cystoscopy with stent placement Bilateral 04/19/2013    Procedure: CYSTOSCOPY WITH STENT PLACEMENT;  Surgeon: Anner Crete, MD;  Location: [facility] OR;  Service: Urology;  Laterality: Bilateral;  . Cystoscopy N/A 04/19/2013    Procedure: CYSTOSCOPY WITH CLOT EVACUATION ;  Surgeon: Anner Crete, MD;  Location: [facility] OR;  Service: Urology;  Laterality: N/A;  . Laparotomy N/A 04/19/2013    Procedure: EXPLORATORY LAPAROTOMY;  Surgeon: Anner Crete, MD;  Location: [facility] OR;  Service: Urology;  Laterality: N/A;  Exploratory Laparotomy with evacuation of blood clot in bladder with  placement of super pubic tube   Ct Abdomen Pelvis Wo Contrast  04/30/2013   *RADIOLOGY REPORT*  Clinical Data:   Acute renal failure.  CT ABDOMEN AND PELVIS WITHOUT CONTRAST  Technique:  Multidetector CT imaging of the abdomen and pelvis was performed following the standard protocol without intravenous contrast.  Comparison: CT scan from 04/16/2013  Findings: Focal atelectasis or pneumonia is seen in the posterior left base.  No focal abnormalities seen in the liver or spleen on this study performed without intravenous contrast material.  The stomach is decompressed.  The duodenum, pancreas, gallbladder, and adrenal glands are unremarkable.  Stable appearance of the 7.8 cm cyst in the upper pole of the left kidney.  6 mm nonobstructing stone is seen in the lower pole of the left kidney.  The left sub capsular hematoma has decreased in the interval measuring about 1.7 cm in thickness today compared 2.4 cm previously.  The left double-J internal ureteral stent remains in place and is stable in position. There is been interval placement of a right double-J internal ureteral stent with the proximal loop formed and upper pole calix and the lower loop formed in the urinary bladder.  IVC filter again noted.  The no abdominal aortic aneurysm.  No free fluid or lymphadenopathy in the abdomen.  Imaging through the pelvis shows no apparent midline  lower abdominal wound suggesting recent surgery.  Edema or inflammation is seen in the soft tissues of the extraperitoneal pelvic floor. Bilateral inguinal hernias contain only fat.  There is subcutaneous edema in the lower abdomen and pelvis bilaterally.  No colonic diverticulitis.  No evidence for bowel obstruction.  The patient is status post right hemicolectomy.  Urinary bladder is decompressed by a suprapubic tube.  Bone windows reveal no worrisome lytic or sclerotic osseous lesions.  IMPRESSION: Interval decrease in size of the left renal subcapsular fluid collection,  suggesting resolving hematoma.  The patient has bilateral internal ureteral stents without overt hydronephrosis at this time.  There is some fullness of the right intrarenal collecting system which is stable to mildly increased in the interval.  Fullness of the left intrarenal collecting system seen previously has decreased in the interval.   Original Report Authenticated By: Kennith Center, M.D.   Dg Chest 2 View  04/27/2013   *RADIOLOGY REPORT*  Clinical Data: Chest pain, extremity numbness  CHEST - 2 VIEW  Comparison: Prior chest x-ray 04/21/2013  Findings: Stable low inspiratory volumes with perhaps trace subsegmental atelectasis.  The lungs are otherwise clear.  Cardiac and mediastinal contours are upper limits of normal but unchanged compared to prior.  No acute osseous abnormality.  Remote healed right-sided rib fractures.  No pleural effusion or pneumothorax.  IMPRESSION: Low inspiratory volumes, but otherwise no acute cardiopulmonary process.   Original Report Authenticated By: Malachy Moan, M.D.   Ir Ivc Filter Plmt / S&i /img Guid/mod Sed  04/18/2013   *RADIOLOGY REPORT*  Indication: High probability for pulmonary embolism based on ventilation and perfusion nuclear medicine examination.  Hematuria and left renal hematoma.  The patient is not a candidate for anticoagulation at this time.  PROCEDURE(S): IVC FILTER PLACEMENT; IVC VENOGRAM; ULTRASOUND FOR VASCULAR ACCESS  Physician:  Rachelle Hora. Henn, MD  Medications: Versed 4 mg, Fentanyl 100 mcg. A radiology nurse monitored the patient for moderate sedation.  Moderate sedation time: 33 minutes  Fluoroscopy time:  2 minutes and 36 seconds  Contrast:Carbon dioxide  Procedure:Informed consent was obtained for an IVC venogram and filter placement.  Ultrasound demonstrated a patent right common femoral vein.  Ultrasound images were obtained for documentation. The right groin was prepped and draped in a sterile fashion. Maximal barrier sterile technique was  utilized including caps, mask, sterile gowns, sterile gloves, sterile drape, hand hygiene and skin antiseptic.  The skin was anesthetized with 1% lidocaine. A 21 gauge needle was directed into the vein with ultrasound guidance and a micropuncture dilator set was placed.  A wire was advanced into the IVC.  The filter sheath was advanced over the wire into the IVC.  An IVC venogram was performed with carbon dioxide.  Fluoroscopic images were obtained for documentation. The location of the renal veins was confirmed by cannulating the renal veins with a Bentson wire.   A Denali filter was deployed below the lowest renal vein.  The vascular sheath was removed with manual compression.  Findings:IVC was patent.  Bilateral renal veins were identified. The filter was deployed below the lowest renal vein.  Impression:Successful placement of a retrievable IVC filter.   Original Report Authenticated By: Richarda Overlie, M.D.   Dg Retrograde Pyelogram  04/20/2013   *RADIOLOGY REPORT*  Clinical Data: Ureteral obstruction, hydronephrosis with acute kidney failure  RETROGRADE PYELOGRAM  Comparison: CT abdomen pelvis - 04/16/2013  Fluoroscopy time:  37 seconds  Findings:  A single spot intraoperative image of the lower pelvis  is provided for review.  A cystoscope overlies the expected location of the urinary bladder.  Image demonstrates the caudal aspect of bilateral double J ureteral stents.   There is minimal contrast opacification of the distal aspect of the bilateral ureters.  The urinary bladder is underdistended but there is the suggestion of a persistent filling defect within the urinary bladder compatible with known bladder hematoma.  IMPRESSION: Post bilateral double-J ureteral stent placement.   Original Report Authenticated By: Tacey Ruiz, MD   US Renal  04/30/2013   CLINICAL DATA:  Acute renal failure.  EXAM: RENAL/URINARY TRACT ULTRASOUND COMPLETE  COMPARISON:  04/18/2013  FINDINGS: Right Kidney  Length: 10.7 cm. Mild  right hydronephrosis. Stent is visualized within the dilated renal pelvis. 1.4 cm cyst.  Left Kidney  Length: 14.0 cm. Slight pelvicaliectasis. Stent cannot be visualized.  7.1 cm cystic mass in the upper pole of the left kidney. This likely represents a benign cyst and is stable since prior study.  Bladder:  Cannot visualize.  IMPRESSION: Mild right hydronephrosis and slight pelvicaliectasis on the left. These findings have improved since prior study.  Bilateral renal cysts.   Electronically Signed   By: Charlett Nose M.D.   On: 04/30/2013 16:58   US Renal  04/19/2013   *RADIOLOGY REPORT*  Clinical Data: Recent transurethral resection of prostate. Hypertension.  Colon cancer.  Ureteral calcification.  RENAL/URINARY TRACT ULTRASOUND COMPLETE  Comparison:  CT abdomen and pelvis 04/16/2013  Findings:  Right Kidney:  Right kidney measures 13.7 cm in length.  There is diffuse parenchymal atrophy with prominent hydronephrosis. Hydronephrosis appears to be increasing since the previous CT scan.  Left Kidney:  Left kidney measures 13.5 cm length.  Cystic structure off of the upper pole appears to communicate with upper pole calix and may be a cyst or a calyceal diverticulum.  Moderate hydronephrosis of the left kidney.  This is similar to previous study.  The left ureteral stent is not identified and may have been removed in the interval.  There is a subcapsular fluid collection in the mid pole which appears smaller than on the previous study. This could be hematoma or urinoma.  No pararenal fluid collections.  Bladder:  Bladder volume measures 572 ml.  Foley catheter is in the bladder base and is open to drainage.  Recommend check Foley catheter function.  There is a large heterogeneous filling defect in the bladder measuring 7.8 x 8.2 x 7.2 cm.  This is of mixed but predominate hyperechoic appearance.  No flow is demonstrated on color flow Doppler imaging.  This likely represents a large blood clot.  No definite bladder  wall thickening.  IMPRESSION: Large blood clot in the bladder with Foley catheter in place. Bilateral hydronephrosis with progressive hydronephrosis on the right since previous CT scan.  Left renal cyst or calyceal dilatation.  Small subcapsular collection on the left may represent hematoma or urinoma.   Original Report Authenticated By: Burman Nieves, M.D.   Dg Chest Port 1 View  04/21/2013   *RADIOLOGY REPORT*  Clinical Data: Short of breath, assess edema  PORTABLE CHEST - 1 VIEW  Comparison: Prior chest x-ray 04/20/2013  Findings: The patient has been extubated and the nasogastric tube removed.  Right IJ central venous catheter in unchanged position with the tip at the superior cavoatrial junction.  Slight interval decrease in pulmonary edema.  Bibasilar atelectasis versus infiltrate and likely small bilateral effusions persist.  Stable cardiomegaly and mediastinal contours.  Atherosclerotic calcifications noted in  the transverse aorta.  No pneumothorax. Healed right-sided rib fractures.  IMPRESSION:  1.  Interval extubation and removal of nasogastric tube. 2.  Stable to slightly improved pulmonary edema. 3.  Persistent bibasilar atelectasis versus infiltrates and likely small effusions.   Original Report Authenticated By: Malachy Moan, M.D.   Dg Chest Port 1 View  04/20/2013   *RADIOLOGY REPORT*  Clinical Data: Evaluate pulmonary edema  PORTABLE CHEST - 1 VIEW  Comparison: 04/19/2013; 04/18/2013  Findings:  Grossly unchanged enlarged cardiac silhouette and mediastinal contours given persistently reduced lung volumes.  Stable positioning of support apparatus.  No pneumothorax. Mild cephalization of flow without frank evidence of edema.  Trace bilateral effusions are not excluded. No change to slight worsening in bibasilar heterogeneous opacities, left greater than right. There is unchanged deformity involving the posterior lateral aspect of the right eighth and ninth ribs.  No acute osseous  abnormalities.  IMPRESSION: 1.  Stable positioning of support apparatus.  No pneumothorax. 2.  Persistent findings of cardiomegaly, hypoventilation and pulmonary venous congestion without frank evidence of edema.  3.  No change to slight worsening of bibasilar opacities, left greater than right, likely atelectasis.   Original Report Authenticated By: Tacey Ruiz, MD   Portable Chest Xray  04/19/2013   *RADIOLOGY REPORT*  Clinical Data: Endotracheal tube placement and central line placement.  PORTABLE CHEST - 1 VIEW  Comparison: Earlier today at 12:09 p.m.  Findings: Mildly degraded exam due to AP portable technique and patient body habitus.  Endotracheal tube 3.0 cm above carina.  Nasogastric tube extends beyond the  inferior aspect of the film.  Right IJ central line difficult to visualize centrally.  Followed to at least the level of the mid right atrium.  No pneumothorax.  Remote right rib trauma. Cardiomegaly accentuated by AP portable technique.  Left costophrenic angle  excluded.  No definite pleural fluid. No congestive failure.  Mild right base volume loss.  IMPRESSION: Endotracheal tube appropriately positioned.  Right internal jugular line which is difficult to visualize centrally.  Followed to at least the level of the mid right atrium. Recommend retraction approximately 5.5 cm with repeat radiograph.  Cardiomegaly and low lung volumes, without congestive failure   Original Report Authenticated By: Jeronimo Greaves, M.D.   Dg Chest Port 1 View  04/19/2013   *RADIOLOGY REPORT*  Clinical Data: Evaluate for pulmonary edema.  PORTABLE CHEST - 1 VIEW  Comparison: Chest x-ray 04/18/2013.  Findings: Lung volumes are normal.  No consolidative airspace disease.  No pleural effusions.  No pneumothorax.  No pulmonary nodule or mass noted.  Pulmonary vasculature and the cardiomediastinal silhouette are within normal limits.  IMPRESSION: 1. No radiographic evidence of acute cardiopulmonary disease. Specifically, no  evidence of pulmonary edema at this time.   Original Report Authenticated By: Trudie Reed, M.D.   Dg Chest Port 1 View  04/18/2013   *RADIOLOGY REPORT*  Clinical Data: Evaluate for edema  PORTABLE CHEST - 1 VIEW  Comparison: 04/16/2013  Findings: Low lung volumes are present taking this into consideration heart size is mildly enlarged and stable.  The mediastinal contour appears unchanged with the right hilum appearing somewhat less prominent than on the prior exam. Mild central peribronchial cuffing and interstitial haziness is seen and this may indicate early or incipient pulmonary edema however the interstitium may be accentuated by low lung volumes as well.  No interstitial septal lines, pleural effusion or overt pulmonary edema is seen. No focal infiltrates are noted.  Old healed rib  fracture of the right posterior eighth rib is stable.  IMPRESSION: Subtle changes may indicate early or developing interstitial edema but no overt interstitial or alveolar edema is seen.  No focal infiltrates.   Original Report Authenticated By: Rhodia Albright, M.D.   Ct Angio Abd/pel W/ And/or W/o  05/10/2013   *RADIOLOGY REPORT*  Clinical Data: Extensive bilateral DVT, evaluate for caval thrombus.  CT ANGIOGRAPHY ABDOMEN AND PELVIS WITH CONTRAST AND WITHOUT CONTRAST  Technique:  Multidetector CT imaging of the abdomen and pelvis was performed following the standard protocol during bolus administration of intravenous contrast.  Comparison: Prior CT abdomen/pelvis 04/30/2013  Findings:  Lower Chest:  Mild dependent atelectasis versus scarring in the posterior left lower lobe.  The lung bases are otherwise clear. Visualized heart within normal limits for size.  No pericardial effusion.  Unremarkable distal thoracic esophagus.  Abdomen: Unremarkable CT appearance of the stomach, duodenum, spleen, pancreas and left adrenal gland.  Dystrophic calcification in the right adrenal gland is nonspecific, but stable.  Normal hepatic  contours and morphology.  No focal hepatic mass. Gallbladder is unremarkable. No intra or extrahepatic biliary ductal dilatation.  Stable 7.7 cm simple cyst exophytic from the upper pole of the left kidney.  Stable low attenuation left subcapsular fluid collection, likely a liquefied subcapsular hematoma.  No hydronephrosis. Bilateral double J ureteral stents are in place.  There are multiple small parapelvic cysts in the right kidney.  Small exophytic renal cortical cyst on the right.  Symmetric bilateral renal perfusion.  No bowel obstruction or focal bowel wall thickening.  No free fluid or suspicious adenopathy.  Pelvis: The bladder is decompressed.  There is a suprapubic catheter as well as to double-J ureteral stents.  Surgical changes of prior TURP.  No free fluid or suspicious adenopathy.  Bones: No acute fracture or aggressive appearing lytic or blastic osseous lesion.  Vascular: Extensive thrombus beginning in the inferior vena cava contained within the IVC filter and extending through out of the bilateral iliac and femoral venous systems.  Thrombus likely extends into the superficial great saphenous veins bilaterally as well.  No significant provocation superior to the filter.  The renal veins are patent bilaterally.  Mild scattered atherosclerotic vascular calcifications.  No acute arterial abnormality.  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  No evidence of propagation more centrally within the inferior vena cava.  The bilateral renal veins remain widely patent.  Additional ancillary findings as above without significant interval change.   Original Report Authenticated By: Malachy Moan, M.D.  Results for orders placed during the hospital encounter of 04/30/13  CULTURE, BLOOD (ROUTINE X 2)      Result Value Range   Specimen Description BLOOD LEFT HAND     Special Requests BOTTLES DRAWN AEROBIC ONLY 3CC     Culture   Setup Time       Value: 04/30/2013 13:06     Performed at [facility]   Culture       Value: NO GROWTH 5 DAYS     Performed at [facility]   Report Status 05/06/2013 FINAL    CULTURE, BLOOD (ROUTINE X 2)      Result Value Range   Specimen Description BLOOD RIGHT HAND     Special Requests BOTTLES DRAWN AEROBIC ONLY 4CC     Culture  Setup Time       Value: 04/30/2013 13:06     Performed at [facility]   Culture       Value: NO GROWTH 5 DAYS     Performed at [facility]   Report Status 05/06/2013 FINAL    CBC      Result Value Range   WBC 26.1 (*) 4.0 - 10.5 K/uL   RBC 2.96 (*) 4.22 - 5.81 MIL/uL   Hemoglobin 8.6 (*) 13.0 - 17.0 g/dL   HCT 16.1 (*) 09.6 - 04.5 %   MCV 83.8  78.0 - 100.0 fL   MCH 29.1  26.0 - 34.0 pg   MCHC 34.7  30.0 - 36.0 g/dL   RDW 40.9  81.1 - 91.4 %   Platelets 159  150 - 400 K/uL  COMPREHENSIVE METABOLIC PANEL      Result Value Range   Sodium 126 (*) 135 - 145 mEq/L   Potassium 5.1  3.5 - 5.1 mEq/L   Chloride 90 (*) 96 - 112 mEq/L   CO2 15 (*) 19 - 32 mEq/L   Glucose, Bld 122 (*) 70 - 99 mg/dL   BUN 94 (*) 6 - 23 mg/dL   Creatinine, Ser 7.82 (*) 0.50 - 1.35 mg/dL   Calcium 8.2 (*) 8.4 - 10.5 mg/dL   Total Protein 6.4  6.0 - 8.3 g/dL   Albumin 2.7 (*) 3.5 - 5.2 g/dL   AST 20  0 - 37 U/L   ALT 15  0 - 53 U/L   Alkaline Phosphatase 98  39 - 117 U/L   Total Bilirubin 0.5  0.3 - 1.2 mg/dL   GFR calc non Af Amer 7 (*) >90 mL/min   GFR calc Af Amer 9 (*) >90 mL/min  SODIUM, URINE, RANDOM      Result Value Range   Sodium, Ur 21    CREATININE, URINE, RANDOM      Result Value Range   Creatinine, Urine 147.00    VANCOMYCIN, RANDOM      Result Value Range   Vancomycin Rm 27.7    BASIC METABOLIC PANEL      Result Value Range   Sodium 125 (*) 135 - 145 mEq/L   Potassium 3.9  3.5 - 5.1 mEq/L   Chloride 86 (*) 96 - 112 mEq/L   CO2 20  19 - 32 mEq/L   Glucose, Bld 115 (*) 70 - 99 mg/dL   BUN 99 (*) 6 - 23 mg/dL    Creatinine, Ser 9.56 (*) 0.50 - 1.35 mg/dL   Calcium 7.1 (*) 8.4 - 10.5 mg/dL   GFR calc non Af Amer 9 (*) >90 mL/min   GFR calc Af Amer 10 (*) >90 mL/min  RENAL FUNCTION PANEL      Result Value Range   Sodium 126 (*) 135 - 145 mEq/L   Potassium 3.9  3.5 - 5.1 mEq/L   Chloride 85 (*) 96 - 112 mEq/L   CO2 22  19 - 32 mEq/L   Glucose, Bld 123 (*) 70 - 99 mg/dL   BUN 213 (*) 6 - 23 mg/dL   Creatinine, Ser 0.86 (*) 0.50 - 1.35 mg/dL   Calcium 7.4 (*) 8.4 - 10.5 mg/dL   Phosphorus 9.4 (*) 2.3 - 4.6 mg/dL   Albumin 2.2 (*) 3.5 - 5.2 g/dL   GFR calc non Af Amer 8 (*) >90 mL/min   GFR calc Af Amer 10 (*) >90 mL/min  CBC WITH DIFFERENTIAL      Result Value Range   WBC 15.9 (*) 4.0 - 10.5 K/uL   RBC  2.50 (*) 4.22 - 5.81 MIL/uL   Hemoglobin 7.3 (*) 13.0 - 17.0 g/dL   HCT 16.1 (*) 09.6 - 04.5 %   MCV 82.8  78.0 - 100.0 fL   MCH 29.2  26.0 - 34.0 pg   MCHC 35.3  30.0 - 36.0 g/dL   RDW 40.9  81.1 - 91.4 %   Platelets 134 (*) 150 - 400 K/uL   Neutrophils Relative % 85 (*) 43 - 77 %   Neutro Abs 13.5 (*) 1.7 - 7.7 K/uL   Lymphocytes Relative 4 (*) 12 - 46 %   Lymphs Abs 0.7  0.7 - 4.0 K/uL   Monocytes Relative 11  3 - 12 %   Monocytes Absolute 1.7 (*) 0.1 - 1.0 K/uL   Eosinophils Relative 0  0 - 5 %   Eosinophils Absolute 0.0  0.0 - 0.7 K/uL   Basophils Relative 0  0 - 1 %   Basophils Absolute 0.0  0.0 - 0.1 K/uL  RENAL FUNCTION PANEL      Result Value Range   Sodium 129 (*) 135 - 145 mEq/L   Potassium 3.5  3.5 - 5.1 mEq/L   Chloride 87 (*) 96 - 112 mEq/L   CO2 26  19 - 32 mEq/L   Glucose, Bld 123 (*) 70 - 99 mg/dL   BUN 97 (*) 6 - 23 mg/dL   Creatinine, Ser 7.82 (*) 0.50 - 1.35 mg/dL   Calcium 7.1 (*) 8.4 - 10.5 mg/dL   Phosphorus 7.9 (*) 2.3 - 4.6 mg/dL   Albumin 2.1 (*) 3.5 - 5.2 g/dL   GFR calc non Af Amer 11 (*) >90 mL/min   GFR calc Af Amer 12 (*) >90 mL/min  CBC WITH DIFFERENTIAL      Result Value Range   WBC 13.3 (*) 4.0 - 10.5 K/uL   RBC 2.46 (*) 4.22 - 5.81 MIL/uL    Hemoglobin 7.2 (*) 13.0 - 17.0 g/dL   HCT 95.6 (*) 21.3 - 08.6 %   MCV 83.3  78.0 - 100.0 fL   MCH 29.3  26.0 - 34.0 pg   MCHC 35.1  30.0 - 36.0 g/dL   RDW 57.8 (*) 46.9 - 62.9 %   Platelets 145 (*) 150 - 400 K/uL   Neutrophils Relative % 80 (*) 43 - 77 %   Neutro Abs 10.6 (*) 1.7 - 7.7 K/uL   Lymphocytes Relative 8 (*) 12 - 46 %   Lymphs Abs 1.0  0.7 - 4.0 K/uL   Monocytes Relative 12  3 - 12 %   Monocytes Absolute 1.5 (*) 0.1 - 1.0 K/uL   Eosinophils Relative 1  0 - 5 %   Eosinophils Absolute 0.1  0.0 - 0.7 K/uL   Basophils Relative 0  0 - 1 %   Basophils Absolute 0.0  0.0 - 0.1 K/uL  GLUCOSE, CAPILLARY      Result Value Range   Glucose-Capillary 155 (*) 70 - 99 mg/dL   Comment 1 Notify RN     Comment 2 Documented in Chart    CBC      Result Value Range   WBC 12.0 (*) 4.0 - 10.5 K/uL   RBC 3.08 (*) 4.22 - 5.81 MIL/uL   Hemoglobin 9.0 (*) 13.0 - 17.0 g/dL   HCT 52.8 (*) 41.3 - 24.4 %   MCV 84.4  78.0 - 100.0 fL   MCH 29.2  26.0 - 34.0 pg   MCHC 34.6  30.0 - 36.0 g/dL  RDW 15.2  11.5 - 15.5 %   Platelets 163  150 - 400 K/uL  RENAL FUNCTION PANEL      Result Value Range   Sodium 126 (*) 135 - 145 mEq/L   Potassium 3.1 (*) 3.5 - 5.1 mEq/L   Chloride 86 (*) 96 - 112 mEq/L   CO2 24  19 - 32 mEq/L   Glucose, Bld 115 (*) 70 - 99 mg/dL   BUN 86 (*) 6 - 23 mg/dL   Creatinine, Ser 1.61 (*) 0.50 - 1.35 mg/dL   Calcium 7.4 (*) 8.4 - 10.5 mg/dL   Phosphorus 5.4 (*) 2.3 - 4.6 mg/dL   Albumin 2.0 (*) 3.5 - 5.2 g/dL   GFR calc non Af Amer 15 (*) >90 mL/min   GFR calc Af Amer 18 (*) >90 mL/min  RENAL FUNCTION PANEL      Result Value Range   Sodium 129 (*) 135 - 145 mEq/L   Potassium 3.2 (*) 3.5 - 5.1 mEq/L   Chloride 90 (*) 96 - 112 mEq/L   CO2 28  19 - 32 mEq/L   Glucose, Bld 115 (*) 70 - 99 mg/dL   BUN 68 (*) 6 - 23 mg/dL   Creatinine, Ser 0.96 (*) 0.50 - 1.35 mg/dL   Calcium 7.9 (*) 8.4 - 10.5 mg/dL   Phosphorus 3.8  2.3 - 4.6 mg/dL   Albumin 2.1 (*) 3.5 - 5.2 g/dL    GFR calc non Af Amer 22 (*) >90 mL/min   GFR calc Af Amer 26 (*) >90 mL/min  CBC      Result Value Range   WBC 9.1  4.0 - 10.5 K/uL   RBC 3.11 (*) 4.22 - 5.81 MIL/uL   Hemoglobin 9.0 (*) 13.0 - 17.0 g/dL   HCT 04.5 (*) 40.9 - 81.1 %   MCV 85.2  78.0 - 100.0 fL   MCH 28.9  26.0 - 34.0 pg   MCHC 34.0  30.0 - 36.0 g/dL   RDW 91.4  78.2 - 95.6 %   Platelets 180  150 - 400 K/uL  CBC      Result Value Range   WBC 9.2  4.0 - 10.5 K/uL   RBC 3.19 (*) 4.22 - 5.81 MIL/uL   Hemoglobin 9.3 (*) 13.0 - 17.0 g/dL   HCT 21.3 (*) 08.6 - 57.8 %   MCV 86.2  78.0 - 100.0 fL   MCH 29.2  26.0 - 34.0 pg   MCHC 33.8  30.0 - 36.0 g/dL   RDW 46.9  62.9 - 52.8 %   Platelets 208  150 - 400 K/uL  BASIC METABOLIC PANEL      Result Value Range   Sodium 130 (*) 135 - 145 mEq/L   Potassium 3.7  3.5 - 5.1 mEq/L   Chloride 93 (*) 96 - 112 mEq/L   CO2 30  19 - 32 mEq/L   Glucose, Bld 108 (*) 70 - 99 mg/dL   BUN 51 (*) 6 - 23 mg/dL   Creatinine, Ser 4.13 (*) 0.50 - 1.35 mg/dL   Calcium 7.9 (*) 8.4 - 10.5 mg/dL   GFR calc non Af Amer 34 (*) >90 mL/min   GFR calc Af Amer 39 (*) >90 mL/min  PROTIME-INR      Result Value Range   Prothrombin Time 15.7 (*) 11.6 - 15.2 seconds   INR 1.28  0.00 - 1.49  APTT      Result Value Range   aPTT 30  24 -  37 seconds  HEPARIN LEVEL (UNFRACTIONATED)      Result Value Range   Heparin Unfractionated 0.33  0.30 - 0.70 IU/mL  CBC      Result Value Range   WBC 10.0  4.0 - 10.5 K/uL   RBC 3.33 (*) 4.22 - 5.81 MIL/uL   Hemoglobin 9.6 (*) 13.0 - 17.0 g/dL   HCT 16.1 (*) 09.6 - 04.5 %   MCV 87.4  78.0 - 100.0 fL   MCH 28.8  26.0 - 34.0 pg   MCHC 33.0  30.0 - 36.0 g/dL   RDW 40.9  81.1 - 91.4 %   Platelets 260  150 - 400 K/uL  HEPARIN LEVEL (UNFRACTIONATED)      Result Value Range   Heparin Unfractionated 0.49  0.30 - 0.70 IU/mL  CBC      Result Value Range   WBC 8.8  4.0 - 10.5 K/uL   RBC 3.29 (*) 4.22 - 5.81 MIL/uL   Hemoglobin 9.3 (*) 13.0 - 17.0 g/dL   HCT 78.2  (*) 95.6 - 52.0 %   MCV 87.8  78.0 - 100.0 fL   MCH 28.3  26.0 - 34.0 pg   MCHC 32.2  30.0 - 36.0 g/dL   RDW 21.3  08.6 - 57.8 %   Platelets 238  150 - 400 K/uL  BASIC METABOLIC PANEL      Result Value Range   Sodium 132 (*) 135 - 145 mEq/L   Potassium 3.7  3.5 - 5.1 mEq/L   Chloride 95 (*) 96 - 112 mEq/L   CO2 27  19 - 32 mEq/L   Glucose, Bld 107 (*) 70 - 99 mg/dL   BUN 42 (*) 6 - 23 mg/dL   Creatinine, Ser 4.69 (*) 0.50 - 1.35 mg/dL   Calcium 8.1 (*) 8.4 - 10.5 mg/dL   GFR calc non Af Amer 41 (*) >90 mL/min   GFR calc Af Amer 48 (*) >90 mL/min  HEPARIN LEVEL (UNFRACTIONATED)      Result Value Range   Heparin Unfractionated 0.66  0.30 - 0.70 IU/mL  CBC      Result Value Range   WBC 8.0  4.0 - 10.5 K/uL   RBC 3.37 (*) 4.22 - 5.81 MIL/uL   Hemoglobin 9.7 (*) 13.0 - 17.0 g/dL   HCT 62.9 (*) 52.8 - 41.3 %   MCV 88.1  78.0 - 100.0 fL   MCH 28.8  26.0 - 34.0 pg   MCHC 32.7  30.0 - 36.0 g/dL   RDW 24.4  01.0 - 27.2 %   Platelets 275  150 - 400 K/uL  HEPARIN LEVEL (UNFRACTIONATED)      Result Value Range   Heparin Unfractionated 0.59  0.30 - 0.70 IU/mL  CBC      Result Value Range   WBC 7.8  4.0 - 10.5 K/uL   RBC 3.27 (*) 4.22 - 5.81 MIL/uL   Hemoglobin 9.4 (*) 13.0 - 17.0 g/dL   HCT 53.6 (*) 64.4 - 03.4 %   MCV 88.4  78.0 - 100.0 fL   MCH 28.7  26.0 - 34.0 pg   MCHC 32.5  30.0 - 36.0 g/dL   RDW 74.2  59.5 - 63.8 %   Platelets 293  150 - 400 K/uL  BASIC METABOLIC PANEL      Result Value Range   Sodium 135  135 - 145 mEq/L   Potassium 4.1  3.5 - 5.1 mEq/L   Chloride 98  96 - 112  mEq/L   CO2 27  19 - 32 mEq/L   Glucose, Bld 76  70 - 99 mg/dL   BUN 32 (*) 6 - 23 mg/dL   Creatinine, Ser 3.24 (*) 0.50 - 1.35 mg/dL   Calcium 8.5  8.4 - 40.1 mg/dL   GFR calc non Af Amer 45 (*) >90 mL/min   GFR calc Af Amer 53 (*) >90 mL/min  HEPARIN LEVEL (UNFRACTIONATED)      Result Value Range   Heparin Unfractionated 0.48  0.30 - 0.70 IU/mL  CBC      Result Value Range   WBC 9.6   4.0 - 10.5 K/uL   RBC 3.68 (*) 4.22 - 5.81 MIL/uL   Hemoglobin 10.4 (*) 13.0 - 17.0 g/dL   HCT 02.7 (*) 25.3 - 66.4 %   MCV 89.9  78.0 - 100.0 fL   MCH 28.3  26.0 - 34.0 pg   MCHC 31.4  30.0 - 36.0 g/dL   RDW 40.3 (*) 47.4 - 25.9 %   Platelets 333  150 - 400 K/uL  BASIC METABOLIC PANEL      Result Value Range   Sodium 135  135 - 145 mEq/L   Potassium 4.0  3.5 - 5.1 mEq/L   Chloride 98  96 - 112 mEq/L   CO2 25  19 - 32 mEq/L   Glucose, Bld 113 (*) 70 - 99 mg/dL   BUN 32 (*) 6 - 23 mg/dL   Creatinine, Ser 5.63 (*) 0.50 - 1.35 mg/dL   Calcium 8.4  8.4 - 87.5 mg/dL   GFR calc non Af Amer 42 (*) >90 mL/min   GFR calc Af Amer 49 (*) >90 mL/min  CBC      Result Value Range   WBC 9.3  4.0 - 10.5 K/uL   RBC 3.31 (*) 4.22 - 5.81 MIL/uL   Hemoglobin 9.5 (*) 13.0 - 17.0 g/dL   HCT 64.3 (*) 32.9 - 51.8 %   MCV 89.1  78.0 - 100.0 fL   MCH 28.7  26.0 - 34.0 pg   MCHC 32.2  30.0 - 36.0 g/dL   RDW 84.1 (*) 66.0 - 63.0 %   Platelets 313  150 - 400 K/uL  CBC      Result Value Range   WBC 9.2  4.0 - 10.5 K/uL   RBC 3.30 (*) 4.22 - 5.81 MIL/uL   Hemoglobin 9.4 (*) 13.0 - 17.0 g/dL   HCT 16.0 (*) 10.9 - 32.3 %   MCV 88.5  78.0 - 100.0 fL   MCH 28.5  26.0 - 34.0 pg   MCHC 32.2  30.0 - 36.0 g/dL   RDW 55.7 (*) 32.2 - 02.5 %   Platelets 303  150 - 400 K/uL  HEPARIN LEVEL (UNFRACTIONATED)      Result Value Range   Heparin Unfractionated 0.28 (*) 0.30 - 0.70 IU/mL  BASIC METABOLIC PANEL      Result Value Range   Sodium 134 (*) 135 - 145 mEq/L   Potassium 4.6  3.5 - 5.1 mEq/L   Chloride 101  96 - 112 mEq/L   CO2 23  19 - 32 mEq/L   Glucose, Bld 114 (*) 70 - 99 mg/dL   BUN 29 (*) 6 - 23 mg/dL   Creatinine, Ser 4.27 (*) 0.50 - 1.35 mg/dL   Calcium 8.2 (*) 8.4 - 10.5 mg/dL   GFR calc non Af Amer 46 (*) >90 mL/min   GFR calc Af Amer 53 (*) >90  mL/min  HEPARIN LEVEL (UNFRACTIONATED)      Result Value Range   Heparin Unfractionated 0.50  0.30 - 0.70 IU/mL  HEPARIN LEVEL (UNFRACTIONATED)       Result Value Range   Heparin Unfractionated 0.54  0.30 - 0.70 IU/mL  HEPARIN LEVEL (UNFRACTIONATED)      Result Value Range   Heparin Unfractionated 0.50  0.30 - 0.70 IU/mL  TYPE AND SCREEN      Result Value Range   ABO/RH(D) O NEG     Antibody Screen NEG     Sample Expiration 05/05/2013     Unit Number [id number]     Blood Component Type RED CELLS,LR     Unit division 00     Status of Unit ISSUED,FINAL     Transfusion Status OK TO TRANSFUSE     Crossmatch Result Compatible     Unit Number [id number]     Blood Component Type RED CELLS,LR     Unit division 00     Status of Unit ISSUED,FINAL     Transfusion Status OK TO TRANSFUSE     Crossmatch Result Compatible    PREPARE RBC (CROSSMATCH)      Result Value Range   Order Confirmation ORDER PROCESSED BY BLOOD BANK     A/P: Pt with hx of PE, ARF/CKD, S/P TURP with bil ureteral stent placement /subsequent left renal capsular hematoma, IVC filter placement; now with extensive IVC/bilateral LE DVT. Plan is for initiation of bilateral LE venous thrombolysis today . Details/risks of procedure d/w pt/family with their understanding and consent. Pt will transfer to MICU afterwards for hemodynamic monitoring and undergo f/u venography on 9/29. Check fibrinogen preprocedure.

## 2013-05-11 NOTE — Progress Notes (Signed)
ANTICOAGULATION CONSULT NOTE - Follow Up Consult  Pharmacy Consult for Heparin Indication: pulmonary embolus & DVT  Allergies  Allergen Reactions  . Contrast Media [Iodinated Diagnostic Agents]     On 04/29/2013 spoke with patient he had some type of procedure at [facility] and broke out in hives after the exams in 1997, he did not know what of type of study he had.  I called [facility] Radiology department the only type of contrast they used during that time  was Conray    Patient Measurements: Height: 5\' 9"  (175.3 cm) Weight: 291 lb 0.1 oz (132 kg) IBW/kg (Calculated) : 70.7 Heparin Dosing Weight:   Vital Signs: Temp: 97.6 F (36.4 C) (09/28 0700) Temp src: Oral (09/28 0700) BP: 113/42 mmHg (09/28 0715) Pulse Rate: 41 (09/28 0725)  Labs:  Recent Labs  05/08/13 0930  05/09/13 0400 05/09/13 1653 05/10/13 0445 05/10/13 1315 05/10/13 1851 05/11/13 0535  HGB  --   < > 10.4* 9.5* 9.4*  --   --   --   HCT  --   --  33.1* 29.5* 29.2*  --   --   --   PLT  --   --  333 313 303  --   --   --   HEPARINUNFRC  --   < > 0.48  --  0.28* 0.50 0.54 0.50  CREATININE 1.54*  --  1.63*  --  1.52*  --   --   --   < > = values in this interval not displayed.  Estimated Creatinine Clearance: 64.4 ml/min (by C-G formula based on Cr of 1.52).   Medications:  Scheduled:  . hydrocortisone  25 mg Rectal BID  . midodrine  10 mg Oral TID WC  . sodium chloride  3 mL Intravenous Q12H    Assessment: 67yo male with PE and IVC thrombus extending through extensive BLE DVTs.  Heparin level therapeutic on current rate.  There is no CBC this AM as the daily order was d/c'd by MD on 9/27.  No bleeding problems noted.    Goal of Therapy:  Heparin level 0.3-0.7 units/ml Monitor platelets by anticoagulation protocol: Yes   Plan:  1.  Continue heparin at current rate 2.  Daily CBC, will not check today 3.  Watch closely for s/s bleeding given recent hematoma near kidney  Marisue Humble,  PharmD Clinical Pharmacist [facility]

## 2013-05-11 NOTE — Progress Notes (Signed)
Dr. Butler Denmark notified that pt is having bradycardia (low 40s) while awake. Other VS normal; pt asymptomatic. Stat EKG being obtained. Renette Butters, Viona Gilmore

## 2013-05-11 NOTE — Consult Note (Signed)
PULMONARY  / CRITICAL CARE MEDICINE  Name: Jack [name] MRN: [id number] DOB: 1945/10/18    ADMISSION DATE:  04/30/2013 CONSULTATION DATE:  05/11/2013  REFERRING MD :  Butler Denmark PRIMARY SERVICE: TRH  CHIEF COMPLAINT:  DVT  BRIEF PATIENT DESCRIPTION: 67 y.o. male with complicated medical history including a recent stay at [facility] for PE S/P TURP who required subsequent bilateral renal stents for obstruction due to hemorrhagic complications of anticoagulation s/p eventual IVC filter. Patient was seen in the ED at [facility] 9/17 where his work up demonstrated creatinine of 6.0, WBC of 37k, potassium of 5.8, sodium 127, tachycardia, and mild hypotension. VQ scan was repeated which was negative for PE. He was given kayexelate in the ED, 2L NS, and transferred to [facility].  In cone he demonstrated bilateral large clot burden DVT in lower ext and IVC was clotted.  Decision was made to perform a catheter directed lysis of clot and patient was transferred to the ICU for monitoring and further care while lytics are on.  SIGNIFICANT EVENTS / STUDIES:  8/29 - TURP w/ L ureteral stent [facility]  9/02 - admit [facility] w/ V/Q "high probability" for PE >> anticoag >> severe hematuria + anemia + renal failure  9/3 - CT abdom reveals large L renal capsular hematoma  9/5 - transferred to [facility] to PCCM service - noted to have large clot in bladder w/ B hydronephrosis  9/5 - IVC filter placement  9/6 - B ureteral stents placed - open evacuation of clot from bladder- suprapubic tube placed  9/7- venous duplex lower extremity - NO DVT  9/21-venous duplex lower extremity- Bilateral: Positive for DVT in the common femoral, profunda, femoral, popliteal, and posterior tibial veins with superficial thrombus in the greater and lesser saphenous veins.  9/22 - IV heparin initiated for DVTs (anticoagulation had been on hold due to renal hematoma)   LINES / TUBES: PIV IR placed sheath for lytic  drip.  CULTURES: 9/17 Blood>>>NTD  ANTIBIOTICS: None  PAST MEDICAL HISTORY :  Past Medical History  Diagnosis Date  . HTN (hypertension)   . Ureteral stone   . Colon cancer   . Shortness of breath   . Asthma   . Chronic kidney disease     acuts on chronic renal failure  . Arthritis   . PE (pulmonary embolism) 04/2013   Past Surgical History  Procedure Laterality Date  . Ureteral stent placement  04/10/2013  . Transurethral resection of prostate  04/10/2013  . Right colectomy  1996  . Tonsillectomy    . Cystoscopy with stent placement Bilateral 04/19/2013    Procedure: CYSTOSCOPY WITH STENT PLACEMENT;  Surgeon: Anner Crete, MD;  Location: [facility] OR;  Service: Urology;  Laterality: Bilateral;  . Cystoscopy N/A 04/19/2013    Procedure: CYSTOSCOPY WITH CLOT EVACUATION ;  Surgeon: Anner Crete, MD;  Location: [facility] OR;  Service: Urology;  Laterality: N/A;  . Laparotomy N/A 04/19/2013    Procedure: EXPLORATORY LAPAROTOMY;  Surgeon: Anner Crete, MD;  Location: [facility] OR;  Service: Urology;  Laterality: N/A;  Exploratory Laparotomy with evacuation of blood clot in bladder with placement of super pubic tube    Prior to Admission medications   Medication Sig Start Date End Date Taking? Authorizing Provider  ALPRAZolam (XANAX) 0.25 MG tablet Take 1 tablet (0.25 mg total) by mouth 3 (three) times daily as needed for anxiety. 04/27/13  Yes Celene Kras, MD  docusate sodium (COLACE) 100 MG capsule Take 100  mg by mouth 3 (three) times daily.   Yes Historical Provider, MD  oxyCODONE (OXY IR/ROXICODONE) 5 MG immediate release tablet Take 5 mg by mouth every 4 (four) hours as needed for pain.   Yes Historical Provider, MD  Testosterone (ANDROGEL PUMP) 20.25 MG/ACT (1.62%) GEL Place 1 Act onto the skin daily.   Yes Historical Provider, MD   Allergies  Allergen Reactions  . Contrast Media [Iodinated Diagnostic Agents]     On 04/29/2013 spoke with patient he had some type of procedure at [facility] and broke  out in hives after the exams in 1997, he did not know what of type of study he had.  I called [facility] Radiology department the only type of contrast they used during that time  was Conray    FAMILY HISTORY:  Family History  Problem Relation Age of Onset  . Urolithiasis    . Deep vein thrombosis Mother   . Heart disease Sister    SOCIAL HISTORY:  reports that he has never smoked. His smokeless tobacco use includes Chew. He reports that  drinks alcohol. He reports that he does not use illicit drugs.  REVIEW OF SYSTEMS:  Leg pain but otherwise 12 point ROS is negative.  SUBJECTIVE: C/O leg pain and swelling.  VITAL SIGNS: Temp:  [97.3 F (36.3 C)-99.3 F (37.4 C)] 97.3 F (36.3 C) (09/28 1100) Pulse Rate:  [41-78] 70 (09/28 1055) Resp:  [16-25] 22 (09/28 1055) BP: (108-130)/(42-66) 130/53 mmHg (09/28 1055) SpO2:  [96 %-100 %] 99 % (09/28 1055) Weight:  [132 kg (291 lb 0.1 oz)] 132 kg (291 lb 0.1 oz) (09/28 0403) HEMODYNAMICS:   VENTILATOR SETTINGS:   INTAKE / OUTPUT: Intake/Output     09/27 0701 - 09/28 0700 09/28 0701 - 09/29 0700   P.O. 310 240   I.V. (mL/kg) 1651 (12.5) 280 (2.1)   Total Intake(mL/kg) 1961 (14.9) 520 (3.9)   Urine (mL/kg/hr) 2100 (0.7) 450 (0.4)   Total Output 2100 450   Net -139 +70        Stool Occurrence 3 x      PHYSICAL EXAMINATION: General:  Chronically ill appearing male, NAD. Neuro:  Alert and interactive, moves all ext to command. HEENT:  [place]/AT, PERRL, EOM-I and MMM. Cardiovascular:  RRR, Nl S1/s2 and -M/R/G. Lungs:  Bibasilar crackles, otherwise clear. Abdomen:  Soft, NT, ND and +BS. Musculoskeletal:  Bilateral leg pitting edema, 2+. Skin:  Erythema on both legs.  LABS:  CBC Recent Labs     05/09/13  0400  05/09/13  1653  05/10/13  0445  WBC  9.6  9.3  9.2  HGB  10.4*  9.5*  9.4*  HCT  33.1*  29.5*  29.2*  PLT  333  313  303   Coag's Recent Labs     05/11/13  1155  APTT  78*  INR  1.13   BMET Recent Labs      05/09/13  0400  05/10/13  0445  NA  135  134*  K  4.0  4.6  CL  98  101  CO2  25  23  BUN  32*  29*  CREATININE  1.63*  1.52*  GLUCOSE  113*  114*   Electrolytes Recent Labs     05/09/13  0400  05/10/13  0445  CALCIUM  8.4  8.2*   Sepsis Markers No results found for this basename: LACTICACIDVEN, PROCALCITON, O2SATVEN,  in the last 72 hours ABG No results found for  this basename: PHART, PCO2ART, PO2ART,  in the last 72 hours Liver Enzymes No results found for this basename: AST, ALT, ALKPHOS, BILITOT, ALBUMIN,  in the last 72 hours Cardiac Enzymes No results found for this basename: TROPONINI, PROBNP,  in the last 72 hours Glucose No results found for this basename: GLUCAP,  in the last 72 hours  Imaging Ct Angio Abd/pel W/ And/or W/o  05/10/2013   *RADIOLOGY REPORT*  Clinical Data: Extensive bilateral DVT, evaluate for caval thrombus.  CT ANGIOGRAPHY ABDOMEN AND PELVIS WITH CONTRAST AND WITHOUT CONTRAST  Technique:  Multidetector CT imaging of the abdomen and pelvis was performed following the standard protocol during bolus administration of intravenous contrast.  Comparison: Prior CT abdomen/pelvis 04/30/2013  Findings:  Lower Chest:  Mild dependent atelectasis versus scarring in the posterior left lower lobe.  The lung bases are otherwise clear. Visualized heart within normal limits for size.  No pericardial effusion.  Unremarkable distal thoracic esophagus.  Abdomen: Unremarkable CT appearance of the stomach, duodenum, spleen, pancreas and left adrenal gland.  Dystrophic calcification in the right adrenal gland is nonspecific, but stable.  Normal hepatic contours and morphology.  No focal hepatic mass. Gallbladder is unremarkable. No intra or extrahepatic biliary ductal dilatation.  Stable 7.7 cm simple cyst exophytic from the upper pole of the left kidney.  Stable low attenuation left subcapsular fluid collection, likely a liquefied subcapsular hematoma.  No hydronephrosis.  Bilateral double J ureteral stents are in place.  There are multiple small parapelvic cysts in the right kidney.  Small exophytic renal cortical cyst on the right.  Symmetric bilateral renal perfusion.  No bowel obstruction or focal bowel wall thickening.  No free fluid or suspicious adenopathy.  Pelvis: The bladder is decompressed.  There is a suprapubic catheter as well as to double-J ureteral stents.  Surgical changes of prior TURP.  No free fluid or suspicious adenopathy.  Bones: No acute fracture or aggressive appearing lytic or blastic osseous lesion.  Vascular: Extensive thrombus beginning in the inferior vena cava contained within the IVC filter and extending through out of the bilateral iliac and femoral venous systems.  Thrombus likely extends into the superficial great saphenous veins bilaterally as well.  No significant provocation superior to the filter.  The renal veins are patent bilaterally.  Mild scattered atherosclerotic vascular calcifications.  No acute arterial abnormality.  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  No evidence of propagation more centrally within the inferior vena cava.  The bilateral renal veins remain widely patent.  Additional ancillary findings as above without significant interval change.   Original Report Authenticated By: Malachy Moan, M.D.   ASSESSMENT / PLAN:  PULMONARY A: No respiratory concerns.  PE history as above.  IVC filter history as above. P:   - Titrate O2. - IS per RT protocol.  CARDIOVASCULAR A: DVT/PE.  Marginal BP prior, on Midodrine. P:  - Continue midodrine. - Tele monitoring.  RENAL A:  Renal failure with left sided hematoma as history above.  Now getting tPA. P:   - Monitor BMET. - Monitor for further signs of bleeding.  GASTROINTESTINAL A:  No active issues. P:   - Heart healthy diet.  HEMATOLOGIC A:  tPA given as a drip for DVT. P:  - Monitor  closely for signs of bleeding. - If there is a concern for hemorrhage then please contact IR. - Type and screen 2 units pRBC. - H&H q6 x4. -  There are multiple labs including fibrinogen, split products...etc. That IR will be following upon. - If flank pain concerning for rebleeds please call IR immediately, Dr. Ruthe Mannan (sp) is oncall for this patient and will deal with the situation.  INFECTIOUS A:  No active issues. P:   - Monitor WBC and fever curve.  ENDOCRINE A:  No active issues. P:   - Monitor.  NEUROLOGIC A:  No active issues. Monitor.  I have personally obtained a history, examined the patient, evaluated laboratory and imaging results, formulated the assessment and plan and placed orders.  Alyson Reedy, M.D. Pulmonary and Critical Care Medicine [facility] Pager: [phone]  05/11/2013, 3:19 PM

## 2013-05-12 ENCOUNTER — Inpatient Hospital Stay (HOSPITAL_COMMUNITY): Payer: PRIVATE HEALTH INSURANCE

## 2013-05-12 DIAGNOSIS — D62 Acute posthemorrhagic anemia: Secondary | ICD-10-CM

## 2013-05-12 DIAGNOSIS — J9600 Acute respiratory failure, unspecified whether with hypoxia or hypercapnia: Secondary | ICD-10-CM

## 2013-05-12 LAB — BASIC METABOLIC PANEL
BUN: 38 mg/dL — ABNORMAL HIGH (ref 6–23)
CO2: 24 mEq/L (ref 19–32)
Calcium: 8.4 mg/dL (ref 8.4–10.5)
Chloride: 105 mEq/L (ref 96–112)
Creatinine, Ser: 1.82 mg/dL — ABNORMAL HIGH (ref 0.50–1.35)
GFR calc Af Amer: 43 mL/min — ABNORMAL LOW (ref >90)
Glucose, Bld: 100 mg/dL — ABNORMAL HIGH (ref 70–99)
Sodium: 138 mEq/L (ref 135–145)

## 2013-05-12 LAB — CBC
HCT: 24.5 % — ABNORMAL LOW (ref 39.0–52.0)
Hemoglobin: 8.1 g/dL — ABNORMAL LOW (ref 13.0–17.0)
MCHC: 33.1 g/dL (ref 30.0–36.0)
Platelets: 210 10*3/uL (ref 150–400)
RDW: 16.2 % — ABNORMAL HIGH (ref 11.5–15.5)
WBC: 8.3 10*3/uL (ref 4.0–10.5)

## 2013-05-12 LAB — PHOSPHORUS: Phosphorus: 3.7 mg/dL (ref 2.3–4.6)

## 2013-05-12 LAB — HEPARIN LEVEL (UNFRACTIONATED)
Heparin Unfractionated: 0.33 IU/mL (ref 0.30–0.70)
Heparin Unfractionated: 0.34 IU/mL (ref 0.30–0.70)
Heparin Unfractionated: 0.35 IU/mL (ref 0.30–0.70)
Heparin Unfractionated: 0.4 IU/mL (ref 0.30–0.70)

## 2013-05-12 LAB — FIBRINOGEN
Fibrinogen: 107 mg/dL — ABNORMAL LOW (ref 204–475)
Fibrinogen: 109 mg/dL — ABNORMAL LOW (ref 204–475)
Fibrinogen: 127 mg/dL — ABNORMAL LOW (ref 204–475)

## 2013-05-12 LAB — MAGNESIUM: Magnesium: 1.8 mg/dL (ref 1.5–2.5)

## 2013-05-12 LAB — TSH: TSH: 1.341 u[IU]/mL (ref 0.350–4.500)

## 2013-05-12 MED ORDER — HYDROCORTISONE 2.5 % RE CREA
TOPICAL_CREAM | Freq: Two times a day (BID) | RECTAL | Status: DC
  Administered 2013-05-12 – 2013-05-15 (×7): via RECTAL
  Administered 2013-05-16: 1 via RECTAL
  Administered 2013-05-16 – 2013-05-26 (×17): via RECTAL
  Administered 2013-05-27: 1 via RECTAL
  Administered 2013-05-27 – 2013-05-29 (×4): via RECTAL
  Filled 2013-05-12 (×4): qty 28.35

## 2013-05-12 MED ORDER — HEPARIN (PORCINE) IN NACL 100-0.45 UNIT/ML-% IJ SOLN
2000.0000 [IU]/h | INTRAMUSCULAR | Status: DC
  Administered 2013-05-12 – 2013-05-14 (×2): 2000 [IU]/h via INTRAVENOUS
  Filled 2013-05-12 (×5): qty 250

## 2013-05-12 MED ORDER — DIPHENHYDRAMINE HCL 50 MG/ML IJ SOLN
50.0000 mg | Freq: Once | INTRAMUSCULAR | Status: AC
  Administered 2013-05-12: 50 mg via INTRAVENOUS

## 2013-05-12 MED ORDER — MIDAZOLAM HCL 2 MG/2ML IJ SOLN
INTRAMUSCULAR | Status: AC | PRN
  Administered 2013-05-12 (×2): 1 mg via INTRAVENOUS
  Administered 2013-05-12: 2 mg via INTRAVENOUS

## 2013-05-12 MED ORDER — IOHEXOL 300 MG/ML  SOLN
100.0000 mL | Freq: Once | INTRAMUSCULAR | Status: AC | PRN
  Administered 2013-05-12: 60 mL via INTRAVENOUS

## 2013-05-12 MED FILL — Heparin Sodium (Porcine) Inj 1000 Unit/ML: INTRAMUSCULAR | Qty: 10 | Status: AC

## 2013-05-12 NOTE — Progress Notes (Deleted)
Jack [name] was transported  via bed to IR.  VSS.  Tenectaplase infusing into bilateral popliteal sheaths.

## 2013-05-12 NOTE — Progress Notes (Signed)
Subjective: Patient with extensive IVC and bilateral LE DVT s/p bilateral LE and IVC thrombolysis with 69F bilateral popliteal vein sheaths in place. Lysis overnight 0.25mg /hr then decreased by half after hematuria noticed last evening. Urine light pink today. Patient states he feels swelling has improved bilaterally, he denies any numbness or tingling. He denies any cold sensation in his LE. He denies any chest pain or shortness of breath. He does c/o mild diffuse RLE pain.  Objective: Physical Exam: BP 129/46  Pulse 72  Temp(Src) 97.9 F (36.6 C) (Oral)  Resp 21  Ht 6' (1.829 m)  Wt 304 lb 7.3 oz (138.1 kg)  BMI 41.28 kg/m2  SpO2 97%  Abd: Soft, NT, ND (+) BS Ext: bilateral LE edema improving, bilateral patella region able to be palpated. Metatarsal region palpated on feet bilaterally. 2-3+ edema. Sheath sites bilaterally popliteal vein 69F dressing intact, no bleeding or hematoma.  Labs: CBC  Recent Labs  05/10/13 0445 05/12/13 0545  WBC 9.2 8.3  HGB 9.4* 8.1*  HCT 29.2* 24.5*  PLT 303 210   BMET  Recent Labs  05/10/13 0445 05/12/13 0545  NA 134* 138  K 4.6 4.1  CL 101 105  CO2 23 24  GLUCOSE 114* 100*  BUN 29* 38*  CREATININE 1.52* 1.82*  CALCIUM 8.2* 8.4   LFT No results found for this basename: PROT, ALBUMIN, AST, ALT, ALKPHOS, BILITOT, BILIDIR, IBILI, LIPASE,  in the last 72 hours PT/INR  Recent Labs  05/26/2013 1155  LABPROT 14.3  INR 1.13     Studies/Results: Ir Veno/ext/bi  May 26, 2013   *RADIOLOGY REPORT*  PRIOR ULTRASOUND GUIDED VASCULAR ACCESS; IR INFERIOR VENA CAVA GRAM; IR THROMBOLYSIS/THROMBECTOMY BILATERAL LOWER EXTREMITIES; IR INITIATION OF VENOUS LYSIS INITIAL DAY  Date: 05/26/13  Clinical History: 67 year old male with complicated medical history.  He had a TURP and left double-J ureteral stent placement procedure done in the end of August which was complicated by postoperative PE and subsequent postoperative hemorrhage following initiation  of anticoagulation.  Hemostasis was obtained and IVC filter was placed.  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  He is now nearly 4 weeks post operative and showing in significant improvement with systemic heparinization.  He presents for attempted pharmacomechanical catheter directed thrombolysis/thrombectomy.  Procedures Performed: 1. Ultrasound-guided puncture of the left popliteal vein 2.  Catheterization of the inferior vena cava above the IVC filter with vena cava gram 3.  Power pulse spray infusion of 6 mg of TPA from the popliteal access to the infrarenal IVC 4.  Placement of a 90 cm total length 50 cm infusion length multi side-hole infusion catheter 5.  Ultrasound-guided puncture of the right popliteal vein 6.  Catheterization of the inferior vena cava above the IVC filter 7.  Power pulse spray infusion of 6 mg of TPA from the right popliteal access into the infrarenal IVC 8.  Placement of a 130 cm total length 50 cm infusion length multi side-hole infusion catheter 9.  Initiation of venous thrombolysis  Interventional Radiologist:  Sterling Big, MD  Sedation: Moderate (conscious) sedation was used.  Four mg Versed, 100 mcg Fentanyl were administered intravenously.  The patient's vital signs were monitored continuously by radiology nursing throughout the procedure.  Sedation Time: 60 minutes  Fluoroscopy time: 15 minutes 6 seconds  Contrast volume: 10 ml Omnipaque-300  Intravenous medications:  A total of 12 mg TPA was administered  into the thrombus  PROCEDURE/FINDINGS:   Informed consent was obtained from the patient following explanation of the procedure, risks, benefits and alternatives. The patient understands, agrees and consents for the procedure. All questions were addressed. A time out was performed.  Maximal barrier sterile technique utilized including  caps, mask, sterile gowns, sterile gloves, large sterile drape, hand hygiene, and betadine skin prep.  The left popliteal fossa was interrogated with ultrasound.  The popliteal vein is expanded and completely thrombosed.  Local anesthesia was attained by infiltration with 1% lidocaine.  Under real time sonographic guidance, the thrombosed vein was punctured with a 21-gauge micropuncture needle.  Images obtained stored for the medical record.  The transitional micro sheath was upsized to a 6-French working vascular sheath over a short Amplatz wire.  Using a stiff Glidewire and angled catheter, the catheter was used to navigate into the infrarenal IVC just beyond the nose cone of the IVC filter.  An inferior vena cava gram was performed limiting the amount of intravenous contrast used.  The left renal vein and inferior vena cava are patent.  Inflow from the right renal vein is also noted indicating patency.  The wire was advanced into the suprarenal IVC.  The 6-French 90 cm length AngioJet device was then used to power pulse spray a total of 6 mg of TPA and 125 ml of saline from the popliteal vein throughout the left lower extremity and into the IVC filter.  A 90 cm total length, 50 cm infusion length Unifuse infusion catheter was then positioned over the wire so that the proximal side hole was just within the filter, and the more proximal side hole in the mid thigh.  The sheath and catheter were secured to the skin with post silk suture.  The right popliteal fossa was interrogated with ultrasound.  Again, the popliteal vein was found be expanded completely thrombosed. Local anesthesia was attained by infiltration with 1% lidocaine. Under real time sonographic guidance, the vein was punctured using a 21-gauge micropuncture needle.  Images obtained stored for the medical record.  A 6-French working vascular sheath was then placed over a stiff Amplatz wire after transitioning from the transitional micro sheath.  The  wire was advanced into the suprarenal IVC.  Care was taken to navigate the wire through the right aspect of the IVC and the filter to maximize lytic dispersal.  The 6-French AngioJet was then used to power pulse spray a total of 6 mg of TPA and 125 ml of saline from the popliteal artery into the IVC.  A 130 cm total length 50 cm infusion length Unifuse multi side-hole infusion catheter was then placed in identical position to the contralateral side.  Initial venous lysis was initiated at 0.25 mg connector place through each catheter for a total of 0.5 mg per hour.  Overall, the patient tolerated the procedure very well.  There was no immediate complication.  IMPRESSION:  1.  Inferior venacavagram demonstrates patency of the renal veins and IVC above the level of the filter.  2. Initial limited bilateral lower extremity pharmacomechanical thrombolysis.  3.  Initiation of bilateral lower extremity venous lysis using bilateral multi side-hole infusion catheters.  The patient will lyse for 12-24 hours and return interventional radiology for venogram and potential adjunctive interventions tomorrow.  Signed,  Sterling Big, MD [facility]   Original Report Authenticated By: Malachy Moan, M.D.   Ir Caffie Damme Ivc  05/11/2013   *RADIOLOGY REPORT*  PRIOR  ULTRASOUND GUIDED VASCULAR ACCESS; IR INFERIOR VENA CAVA GRAM; IR THROMBOLYSIS/THROMBECTOMY BILATERAL LOWER EXTREMITIES; IR INITIATION OF VENOUS LYSIS INITIAL DAY  Date: 05/11/2013  Clinical History: 67 year old male with complicated medical history.  He had a TURP and left double-J ureteral stent placement procedure done in the end of August which was complicated by postoperative PE and subsequent postoperative hemorrhage following initiation of anticoagulation.  Hemostasis was obtained and IVC filter was placed.  The patient subsequently developed extensive bilateral lower extremity swelling and was  found to have extensive bilateral lower extremity and caval DVT extending into the filter.  He is now nearly 4 weeks post operative and showing in significant improvement with systemic heparinization.  He presents for attempted pharmacomechanical catheter directed thrombolysis/thrombectomy.  Procedures Performed: 1. Ultrasound-guided puncture of the left popliteal vein 2.  Catheterization of the inferior vena cava above the IVC filter with vena cava gram 3.  Power pulse spray infusion of 6 mg of TPA from the popliteal access to the infrarenal IVC 4.  Placement of a 90 cm total length 50 cm infusion length multi side-hole infusion catheter 5.  Ultrasound-guided puncture of the right popliteal vein 6.  Catheterization of the inferior vena cava above the IVC filter 7.  Power pulse spray infusion of 6 mg of TPA from the right popliteal access into the infrarenal IVC 8.  Placement of a 130 cm total length 50 cm infusion length multi side-hole infusion catheter 9.  Initiation of venous thrombolysis  Interventional Radiologist:  Sterling Big, MD  Sedation: Moderate (conscious) sedation was used.  Four mg Versed, 100 mcg Fentanyl were administered intravenously.  The patient's vital signs were monitored continuously by radiology nursing throughout the procedure.  Sedation Time: 60 minutes  Fluoroscopy time: 15 minutes 6 seconds  Contrast volume: 10 ml Omnipaque-300  Intravenous medications:  A total of 12 mg TPA was administered into the thrombus  PROCEDURE/FINDINGS:   Informed consent was obtained from the patient following explanation of the procedure, risks, benefits and alternatives. The patient understands, agrees and consents for the procedure. All questions were addressed. A time out was performed.  Maximal barrier sterile technique utilized including caps, mask, sterile gowns, sterile gloves, large sterile drape, hand hygiene, and betadine skin prep.  The left popliteal fossa was interrogated with ultrasound.   The popliteal vein is expanded and completely thrombosed.  Local anesthesia was attained by infiltration with 1% lidocaine.  Under real time sonographic guidance, the thrombosed vein was punctured with a 21-gauge micropuncture needle.  Images obtained stored for the medical record.  The transitional micro sheath was upsized to a 6-French working vascular sheath over a short Amplatz wire.  Using a stiff Glidewire and angled catheter, the catheter was used to navigate into the infrarenal IVC just beyond the nose cone of the IVC filter.  An inferior vena cava gram was performed limiting the amount of intravenous contrast used.  The left renal vein and inferior vena cava are patent.  Inflow from the right renal vein is also noted indicating patency.  The wire was advanced into the suprarenal IVC.  The 6-French 90 cm length AngioJet device was then used to power pulse spray a total of 6 mg of TPA and 125 ml of saline from the popliteal vein throughout the left lower extremity and into the IVC filter.  A 90 cm total length, 50 cm infusion length Unifuse infusion catheter was then positioned over the wire so that the proximal side hole was just within  the filter, and the more proximal side hole in the mid thigh.  The sheath and catheter were secured to the skin with post silk suture.  The right popliteal fossa was interrogated with ultrasound.  Again, the popliteal vein was found be expanded completely thrombosed. Local anesthesia was attained by infiltration with 1% lidocaine. Under real time sonographic guidance, the vein was punctured using a 21-gauge micropuncture needle.  Images obtained stored for the medical record.  A 6-French working vascular sheath was then placed over a stiff Amplatz wire after transitioning from the transitional micro sheath.  The wire was advanced into the suprarenal IVC.  Care was taken to navigate the wire through the right aspect of the IVC and the filter to maximize lytic dispersal.  The  6-French AngioJet was then used to power pulse spray a total of 6 mg of TPA and 125 ml of saline from the popliteal artery into the IVC.  A 130 cm total length 50 cm infusion length Unifuse multi side-hole infusion catheter was then placed in identical position to the contralateral side.  Initial venous lysis was initiated at 0.25 mg connector place through each catheter for a total of 0.5 mg per hour.  Overall, the patient tolerated the procedure very well.  There was no immediate complication.  IMPRESSION:  1.  Inferior venacavagram demonstrates patency of the renal veins and IVC above the level of the filter.  2. Initial limited bilateral lower extremity pharmacomechanical thrombolysis.  3.  Initiation of bilateral lower extremity venous lysis using bilateral multi side-hole infusion catheters.  The patient will lyse for 12-24 hours and return interventional radiology for venogram and potential adjunctive interventions tomorrow.  Signed,  Sterling Big, MD [facility]   Original Report Authenticated By: Malachy Moan, M.D.   Ir Thrombect Veno Mech Mod Sed  05/11/2013   *RADIOLOGY REPORT*  PRIOR ULTRASOUND GUIDED VASCULAR ACCESS; IR INFERIOR VENA CAVA GRAM; IR THROMBOLYSIS/THROMBECTOMY BILATERAL LOWER EXTREMITIES; IR INITIATION OF VENOUS LYSIS INITIAL DAY  Date: 05/11/2013  Clinical History: 67 year old male with complicated medical history.  He had a TURP and left double-J ureteral stent placement procedure done in the end of August which was complicated by postoperative PE and subsequent postoperative hemorrhage following initiation of anticoagulation.  Hemostasis was obtained and IVC filter was placed.  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  He is now nearly 4 weeks post operative and showing in  significant improvement with systemic heparinization.  He presents for attempted pharmacomechanical catheter directed thrombolysis/thrombectomy.  Procedures Performed: 1. Ultrasound-guided puncture of the left popliteal vein 2.  Catheterization of the inferior vena cava above the IVC filter with vena cava gram 3.  Power pulse spray infusion of 6 mg of TPA from the popliteal access to the infrarenal IVC 4.  Placement of a 90 cm total length 50 cm infusion length multi side-hole infusion catheter 5.  Ultrasound-guided puncture of the right popliteal vein 6.  Catheterization of the inferior vena cava above the IVC filter 7.  Power pulse spray infusion of 6 mg of TPA from the right popliteal access into the infrarenal IVC 8.  Placement of a 130 cm total length 50 cm infusion length multi side-hole infusion catheter 9.  Initiation of venous thrombolysis  Interventional Radiologist:  Sterling Big, MD  Sedation: Moderate (conscious) sedation was used.  Four mg Versed, 100  mcg Fentanyl were administered intravenously.  The patient's vital signs were monitored continuously by radiology nursing throughout the procedure.  Sedation Time: 60 minutes  Fluoroscopy time: 15 minutes 6 seconds  Contrast volume: 10 ml Omnipaque-300  Intravenous medications:  A total of 12 mg TPA was administered into the thrombus  PROCEDURE/FINDINGS:   Informed consent was obtained from the patient following explanation of the procedure, risks, benefits and alternatives. The patient understands, agrees and consents for the procedure. All questions were addressed. A time out was performed.  Maximal barrier sterile technique utilized including caps, mask, sterile gowns, sterile gloves, large sterile drape, hand hygiene, and betadine skin prep.  The left popliteal fossa was interrogated with ultrasound.  The popliteal vein is expanded and completely thrombosed.  Local anesthesia was attained by infiltration with 1% lidocaine.  Under real time  sonographic guidance, the thrombosed vein was punctured with a 21-gauge micropuncture needle.  Images obtained stored for the medical record.  The transitional micro sheath was upsized to a 6-French working vascular sheath over a short Amplatz wire.  Using a stiff Glidewire and angled catheter, the catheter was used to navigate into the infrarenal IVC just beyond the nose cone of the IVC filter.  An inferior vena cava gram was performed limiting the amount of intravenous contrast used.  The left renal vein and inferior vena cava are patent.  Inflow from the right renal vein is also noted indicating patency.  The wire was advanced into the suprarenal IVC.  The 6-French 90 cm length AngioJet device was then used to power pulse spray a total of 6 mg of TPA and 125 ml of saline from the popliteal vein throughout the left lower extremity and into the IVC filter.  A 90 cm total length, 50 cm infusion length Unifuse infusion catheter was then positioned over the wire so that the proximal side hole was just within the filter, and the more proximal side hole in the mid thigh.  The sheath and catheter were secured to the skin with post silk suture.  The right popliteal fossa was interrogated with ultrasound.  Again, the popliteal vein was found be expanded completely thrombosed. Local anesthesia was attained by infiltration with 1% lidocaine. Under real time sonographic guidance, the vein was punctured using a 21-gauge micropuncture needle.  Images obtained stored for the medical record.  A 6-French working vascular sheath was then placed over a stiff Amplatz wire after transitioning from the transitional micro sheath.  The wire was advanced into the suprarenal IVC.  Care was taken to navigate the wire through the right aspect of the IVC and the filter to maximize lytic dispersal.  The 6-French AngioJet was then used to power pulse spray a total of 6 mg of TPA and 125 ml of saline from the popliteal artery into the IVC.  A  130 cm total length 50 cm infusion length Unifuse multi side-hole infusion catheter was then placed in identical position to the contralateral side.  Initial venous lysis was initiated at 0.25 mg connector place through each catheter for a total of 0.5 mg per hour.  Overall, the patient tolerated the procedure very well.  There was no immediate complication.  IMPRESSION:  1.  Inferior venacavagram demonstrates patency of the renal veins and IVC above the level of the filter.  2. Initial limited bilateral lower extremity pharmacomechanical thrombolysis.  3.  Initiation of bilateral lower extremity venous lysis using bilateral multi side-hole infusion catheters.  The patient will lyse for  12-24 hours and return interventional radiology for venogram and potential adjunctive interventions tomorrow.  Signed,  Sterling Big, MD [facility]   Original Report Authenticated By: Malachy Moan, M.D.   Ir Thrombect Veno Mech Mod Sed  05/11/2013   *RADIOLOGY REPORT*  PRIOR ULTRASOUND GUIDED VASCULAR ACCESS; IR INFERIOR VENA CAVA GRAM; IR THROMBOLYSIS/THROMBECTOMY BILATERAL LOWER EXTREMITIES; IR INITIATION OF VENOUS LYSIS INITIAL DAY  Date: 05/11/2013  Clinical History: 67 year old male with complicated medical history.  He had a TURP and left double-J ureteral stent placement procedure done in the end of August which was complicated by postoperative PE and subsequent postoperative hemorrhage following initiation of anticoagulation.  Hemostasis was obtained and IVC filter was placed.  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  He is now nearly 4 weeks post operative and showing in significant improvement with systemic heparinization.  He presents for attempted pharmacomechanical catheter directed thrombolysis/thrombectomy.   Procedures Performed: 1. Ultrasound-guided puncture of the left popliteal vein 2.  Catheterization of the inferior vena cava above the IVC filter with vena cava gram 3.  Power pulse spray infusion of 6 mg of TPA from the popliteal access to the infrarenal IVC 4.  Placement of a 90 cm total length 50 cm infusion length multi side-hole infusion catheter 5.  Ultrasound-guided puncture of the right popliteal vein 6.  Catheterization of the inferior vena cava above the IVC filter 7.  Power pulse spray infusion of 6 mg of TPA from the right popliteal access into the infrarenal IVC 8.  Placement of a 130 cm total length 50 cm infusion length multi side-hole infusion catheter 9.  Initiation of venous thrombolysis  Interventional Radiologist:  Sterling Big, MD  Sedation: Moderate (conscious) sedation was used.  Four mg Versed, 100 mcg Fentanyl were administered intravenously.  The patient's vital signs were monitored continuously by radiology nursing throughout the procedure.  Sedation Time: 60 minutes  Fluoroscopy time: 15 minutes 6 seconds  Contrast volume: 10 ml Omnipaque-300  Intravenous medications:  A total of 12 mg TPA was administered into the thrombus  PROCEDURE/FINDINGS:   Informed consent was obtained from the patient following explanation of the procedure, risks, benefits and alternatives. The patient understands, agrees and consents for the procedure. All questions were addressed. A time out was performed.  Maximal barrier sterile technique utilized including caps, mask, sterile gowns, sterile gloves, large sterile drape, hand hygiene, and betadine skin prep.  The left popliteal fossa was interrogated with ultrasound.  The popliteal vein is expanded and completely thrombosed.  Local anesthesia was attained by infiltration with 1% lidocaine.  Under real time sonographic guidance, the thrombosed vein was punctured with a 21-gauge micropuncture needle.  Images obtained stored for the medical record.  The  transitional micro sheath was upsized to a 6-French working vascular sheath over a short Amplatz wire.  Using a stiff Glidewire and angled catheter, the catheter was used to navigate into the infrarenal IVC just beyond the nose cone of the IVC filter.  An inferior vena cava gram was performed limiting the amount of intravenous contrast used.  The left renal vein and inferior vena cava are patent.  Inflow from the right renal vein is also noted indicating patency.  The wire was advanced into the suprarenal IVC.  The 6-French 90 cm length AngioJet device was then used to power pulse spray a total  of 6 mg of TPA and 125 ml of saline from the popliteal vein throughout the left lower extremity and into the IVC filter.  A 90 cm total length, 50 cm infusion length Unifuse infusion catheter was then positioned over the wire so that the proximal side hole was just within the filter, and the more proximal side hole in the mid thigh.  The sheath and catheter were secured to the skin with post silk suture.  The right popliteal fossa was interrogated with ultrasound.  Again, the popliteal vein was found be expanded completely thrombosed. Local anesthesia was attained by infiltration with 1% lidocaine. Under real time sonographic guidance, the vein was punctured using a 21-gauge micropuncture needle.  Images obtained stored for the medical record.  A 6-French working vascular sheath was then placed over a stiff Amplatz wire after transitioning from the transitional micro sheath.  The wire was advanced into the suprarenal IVC.  Care was taken to navigate the wire through the right aspect of the IVC and the filter to maximize lytic dispersal.  The 6-French AngioJet was then used to power pulse spray a total of 6 mg of TPA and 125 ml of saline from the popliteal artery into the IVC.  A 130 cm total length 50 cm infusion length Unifuse multi side-hole infusion catheter was then placed in identical position to the contralateral side.   Initial venous lysis was initiated at 0.25 mg connector place through each catheter for a total of 0.5 mg per hour.  Overall, the patient tolerated the procedure very well.  There was no immediate complication.  IMPRESSION:  1.  Inferior venacavagram demonstrates patency of the renal veins and IVC above the level of the filter.  2. Initial limited bilateral lower extremity pharmacomechanical thrombolysis.  3.  Initiation of bilateral lower extremity venous lysis using bilateral multi side-hole infusion catheters.  The patient will lyse for 12-24 hours and return interventional radiology for venogram and potential adjunctive interventions tomorrow.  Signed,  Sterling Big, MD [facility] Radiology   Original Report Authenticated By: Malachy Moan, M.D.   Ir US Guide Vasc Access Left  05/11/2013   *RADIOLOGY REPORT*  PRIOR ULTRASOUND GUIDED VASCULAR ACCESS; IR INFERIOR VENA CAVA GRAM; IR THROMBOLYSIS/THROMBECTOMY BILATERAL LOWER EXTREMITIES; IR INITIATION OF VENOUS LYSIS INITIAL DAY  Date: 05/11/2013  Clinical History: 67 year old male with complicated medical history.  He had a TURP and left double-J ureteral stent placement procedure done in the end of August which was complicated by postoperative PE and subsequent postoperative hemorrhage following initiation of anticoagulation.  Hemostasis was obtained and IVC filter was placed.  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  He is now nearly 4 weeks post operative and showing in significant improvement with systemic heparinization.  He presents for attempted pharmacomechanical catheter directed thrombolysis/thrombectomy.  Procedures Performed: 1. Ultrasound-guided puncture of the left popliteal vein 2.  Catheterization of the inferior vena cava above the IVC filter with vena  cava gram 3.  Power pulse spray infusion of 6 mg of TPA from the popliteal access to the infrarenal IVC 4.  Placement of a 90 cm total length 50 cm infusion length multi side-hole infusion catheter 5.  Ultrasound-guided puncture of the right popliteal vein 6.  Catheterization of the inferior vena cava above the IVC filter 7.  Power pulse spray infusion of 6 mg of TPA  from the right popliteal access into the infrarenal IVC 8.  Placement of a 130 cm total length 50 cm infusion length multi side-hole infusion catheter 9.  Initiation of venous thrombolysis  Interventional Radiologist:  Sterling Big, MD  Sedation: Moderate (conscious) sedation was used.  Four mg Versed, 100 mcg Fentanyl were administered intravenously.  The patient's vital signs were monitored continuously by radiology nursing throughout the procedure.  Sedation Time: 60 minutes  Fluoroscopy time: 15 minutes 6 seconds  Contrast volume: 10 ml Omnipaque-300  Intravenous medications:  A total of 12 mg TPA was administered into the thrombus  PROCEDURE/FINDINGS:   Informed consent was obtained from the patient following explanation of the procedure, risks, benefits and alternatives. The patient understands, agrees and consents for the procedure. All questions were addressed. A time out was performed.  Maximal barrier sterile technique utilized including caps, mask, sterile gowns, sterile gloves, large sterile drape, hand hygiene, and betadine skin prep.  The left popliteal fossa was interrogated with ultrasound.  The popliteal vein is expanded and completely thrombosed.  Local anesthesia was attained by infiltration with 1% lidocaine.  Under real time sonographic guidance, the thrombosed vein was punctured with a 21-gauge micropuncture needle.  Images obtained stored for the medical record.  The transitional micro sheath was upsized to a 6-French working vascular sheath over a short Amplatz wire.  Using a stiff Glidewire and angled catheter, the  catheter was used to navigate into the infrarenal IVC just beyond the nose cone of the IVC filter.  An inferior vena cava gram was performed limiting the amount of intravenous contrast used.  The left renal vein and inferior vena cava are patent.  Inflow from the right renal vein is also noted indicating patency.  The wire was advanced into the suprarenal IVC.  The 6-French 90 cm length AngioJet device was then used to power pulse spray a total of 6 mg of TPA and 125 ml of saline from the popliteal vein throughout the left lower extremity and into the IVC filter.  A 90 cm total length, 50 cm infusion length Unifuse infusion catheter was then positioned over the wire so that the proximal side hole was just within the filter, and the more proximal side hole in the mid thigh.  The sheath and catheter were secured to the skin with post silk suture.  The right popliteal fossa was interrogated with ultrasound.  Again, the popliteal vein was found be expanded completely thrombosed. Local anesthesia was attained by infiltration with 1% lidocaine. Under real time sonographic guidance, the vein was punctured using a 21-gauge micropuncture needle.  Images obtained stored for the medical record.  A 6-French working vascular sheath was then placed over a stiff Amplatz wire after transitioning from the transitional micro sheath.  The wire was advanced into the suprarenal IVC.  Care was taken to navigate the wire through the right aspect of the IVC and the filter to maximize lytic dispersal.  The 6-French AngioJet was then used to power pulse spray a total of 6 mg of TPA and 125 ml of saline from the popliteal artery into the IVC.  A 130 cm total length 50 cm infusion length Unifuse multi side-hole infusion catheter was then placed in identical position to the contralateral side.  Initial venous lysis was initiated at 0.25 mg connector place through each catheter for a total of 0.5 mg per hour.  Overall, the patient tolerated the  procedure very well.  There was no immediate complication.  IMPRESSION:  1.  Inferior venacavagram demonstrates patency of the renal veins and IVC above the level of the filter.  2. Initial limited bilateral lower extremity pharmacomechanical thrombolysis.  3.  Initiation of bilateral lower extremity venous lysis using bilateral multi side-hole infusion catheters.  The patient will lyse for 12-24 hours and return interventional radiology for venogram and potential adjunctive interventions tomorrow.  Signed,  Sterling Big, MD [facility] Radiology   Original Report Authenticated By: Malachy Moan, M.D.   Ir US Guide Vasc Access Right  05/11/2013   *RADIOLOGY REPORT*  PRIOR ULTRASOUND GUIDED VASCULAR ACCESS; IR INFERIOR VENA CAVA GRAM; IR THROMBOLYSIS/THROMBECTOMY BILATERAL LOWER EXTREMITIES; IR INITIATION OF VENOUS LYSIS INITIAL DAY  Date: 05/11/2013  Clinical History: 66 year old male with complicated medical history.  He had a TURP and left double-J ureteral stent placement procedure done in the end of August which was complicated by postoperative PE and subsequent postoperative hemorrhage following initiation of anticoagulation.  Hemostasis was obtained and IVC filter was placed.  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  He is now nearly 4 weeks post operative and showing in significant improvement with systemic heparinization.  He presents for attempted pharmacomechanical catheter directed thrombolysis/thrombectomy.  Procedures Performed: 1. Ultrasound-guided puncture of the left popliteal vein 2.  Catheterization of the inferior vena cava above the IVC filter with vena cava gram 3.  Power pulse spray infusion of 6 mg of TPA from the popliteal access to the infrarenal IVC 4.  Placement of a 90 cm total length 50 cm  infusion length multi side-hole infusion catheter 5.  Ultrasound-guided puncture of the right popliteal vein 6.  Catheterization of the inferior vena cava above the IVC filter 7.  Power pulse spray infusion of 6 mg of TPA from the right popliteal access into the infrarenal IVC 8.  Placement of a 130 cm total length 50 cm infusion length multi side-hole infusion catheter 9.  Initiation of venous thrombolysis  Interventional Radiologist:  Sterling Big, MD  Sedation: Moderate (conscious) sedation was used.  Four mg Versed, 100 mcg Fentanyl were administered intravenously.  The patient's vital signs were monitored continuously by radiology nursing throughout the procedure.  Sedation Time: 60 minutes  Fluoroscopy time: 15 minutes 6 seconds  Contrast volume: 10 ml Omnipaque-300  Intravenous medications:  A total of 12 mg TPA was administered into the thrombus  PROCEDURE/FINDINGS:   Informed consent was obtained from the patient following explanation of the procedure, risks, benefits and alternatives. The patient understands, agrees and consents for the procedure. All questions were addressed. A time out was performed.  Maximal barrier sterile technique utilized including caps, mask, sterile gowns, sterile gloves, large sterile drape, hand hygiene, and betadine skin prep.  The left popliteal fossa was interrogated with ultrasound.  The popliteal vein is expanded and completely thrombosed.  Local anesthesia was attained by infiltration with 1% lidocaine.  Under real time sonographic guidance, the thrombosed vein was punctured with a 21-gauge micropuncture needle.  Images obtained stored for the medical record.  The transitional micro sheath was upsized to a 6-French working vascular sheath over a short Amplatz wire.  Using a stiff Glidewire and angled catheter, the catheter was used to navigate into the infrarenal IVC just beyond the nose cone of the IVC filter.  An inferior vena cava gram was performed limiting the  amount of intravenous  contrast used.  The left renal vein and inferior vena cava are patent.  Inflow from the right renal vein is also noted indicating patency.  The wire was advanced into the suprarenal IVC.  The 6-French 90 cm length AngioJet device was then used to power pulse spray a total of 6 mg of TPA and 125 ml of saline from the popliteal vein throughout the left lower extremity and into the IVC filter.  A 90 cm total length, 50 cm infusion length Unifuse infusion catheter was then positioned over the wire so that the proximal side hole was just within the filter, and the more proximal side hole in the mid thigh.  The sheath and catheter were secured to the skin with post silk suture.  The right popliteal fossa was interrogated with ultrasound.  Again, the popliteal vein was found be expanded completely thrombosed. Local anesthesia was attained by infiltration with 1% lidocaine. Under real time sonographic guidance, the vein was punctured using a 21-gauge micropuncture needle.  Images obtained stored for the medical record.  A 6-French working vascular sheath was then placed over a stiff Amplatz wire after transitioning from the transitional micro sheath.  The wire was advanced into the suprarenal IVC.  Care was taken to navigate the wire through the right aspect of the IVC and the filter to maximize lytic dispersal.  The 6-French AngioJet was then used to power pulse spray a total of 6 mg of TPA and 125 ml of saline from the popliteal artery into the IVC.  A 130 cm total length 50 cm infusion length Unifuse multi side-hole infusion catheter was then placed in identical position to the contralateral side.  Initial venous lysis was initiated at 0.25 mg connector place through each catheter for a total of 0.5 mg per hour.  Overall, the patient tolerated the procedure very well.  There was no immediate complication.  IMPRESSION:  1.  Inferior venacavagram demonstrates patency of the renal veins and IVC above  the level of the filter.  2. Initial limited bilateral lower extremity pharmacomechanical thrombolysis.  3.  Initiation of bilateral lower extremity venous lysis using bilateral multi side-hole infusion catheters.  The patient will lyse for 12-24 hours and return interventional radiology for venogram and potential adjunctive interventions tomorrow.  Signed,  Sterling Big, MD [facility] Radiology   Original Report Authenticated By: Malachy Moan, M.D.   Ct Angio Abd/pel W/ And/or W/o  05/10/2013   *RADIOLOGY REPORT*  Clinical Data: Extensive bilateral DVT, evaluate for caval thrombus.  CT ANGIOGRAPHY ABDOMEN AND PELVIS WITH CONTRAST AND WITHOUT CONTRAST  Technique:  Multidetector CT imaging of the abdomen and pelvis was performed following the standard protocol during bolus administration of intravenous contrast.  Comparison: Prior CT abdomen/pelvis 04/30/2013  Findings:  Lower Chest:  Mild dependent atelectasis versus scarring in the posterior left lower lobe.  The lung bases are otherwise clear. Visualized heart within normal limits for size.  No pericardial effusion.  Unremarkable distal thoracic esophagus.  Abdomen: Unremarkable CT appearance of the stomach, duodenum, spleen, pancreas and left adrenal gland.  Dystrophic calcification in the right adrenal gland is nonspecific, but stable.  Normal hepatic contours and morphology.  No focal hepatic mass. Gallbladder is unremarkable. No intra or extrahepatic biliary ductal dilatation.  Stable 7.7 cm simple cyst exophytic from the upper pole of the left kidney.  Stable low attenuation left subcapsular fluid collection, likely a liquefied subcapsular hematoma.  No hydronephrosis. Bilateral double J ureteral  stents are in place.  There are multiple small parapelvic cysts in the right kidney.  Small exophytic renal cortical cyst on the right.  Symmetric bilateral renal perfusion.  No bowel obstruction or focal  bowel wall thickening.  No free fluid or suspicious adenopathy.  Pelvis: The bladder is decompressed.  There is a suprapubic catheter as well as to double-J ureteral stents.  Surgical changes of prior TURP.  No free fluid or suspicious adenopathy.  Bones: No acute fracture or aggressive appearing lytic or blastic osseous lesion.  Vascular: Extensive thrombus beginning in the inferior vena cava contained within the IVC filter and extending through out of the bilateral iliac and femoral venous systems.  Thrombus likely extends into the superficial great saphenous veins bilaterally as well.  No significant provocation superior to the filter.  The renal veins are patent bilaterally.  Mild scattered atherosclerotic vascular calcifications.  No acute arterial abnormality.  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  No evidence of propagation more centrally within the inferior vena cava.  The bilateral renal veins remain widely patent.  Additional ancillary findings as above without significant interval change.   Original Report Authenticated By: Malachy Moan, M.D.   Ir Infusion Thrombol Venous Initial (ms)  05/11/2013   *RADIOLOGY REPORT*  PRIOR ULTRASOUND GUIDED VASCULAR ACCESS; IR INFERIOR VENA CAVA GRAM; IR THROMBOLYSIS/THROMBECTOMY BILATERAL LOWER EXTREMITIES; IR INITIATION OF VENOUS LYSIS INITIAL DAY  Date: 05/11/2013  Clinical History: 67 year old male with complicated medical history.  He had a TURP and left double-J ureteral stent placement procedure done in the end of August which was complicated by postoperative PE and subsequent postoperative hemorrhage following initiation of anticoagulation.  Hemostasis was obtained and IVC filter was placed.  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  He is now  nearly 4 weeks post operative and showing in significant improvement with systemic heparinization.  He presents for attempted pharmacomechanical catheter directed thrombolysis/thrombectomy.  Procedures Performed: 1. Ultrasound-guided puncture of the left popliteal vein 2.  Catheterization of the inferior vena cava above the IVC filter with vena cava gram 3.  Power pulse spray infusion of 6 mg of TPA from the popliteal access to the infrarenal IVC 4.  Placement of a 90 cm total length 50 cm infusion length multi side-hole infusion catheter 5.  Ultrasound-guided puncture of the right popliteal vein 6.  Catheterization of the inferior vena cava above the IVC filter 7.  Power pulse spray infusion of 6 mg of TPA from the right popliteal access into the infrarenal IVC 8.  Placement of a 130 cm total length 50 cm infusion length multi side-hole infusion catheter 9.  Initiation of venous thrombolysis  Interventional Radiologist:  Sterling Big, MD  Sedation: Moderate (conscious) sedation was used.  Four mg Versed, 100 mcg Fentanyl were administered intravenously.  The patient's vital signs were monitored continuously by radiology nursing throughout the procedure.  Sedation Time: 60 minutes  Fluoroscopy time: 15 minutes 6 seconds  Contrast volume: 10 ml Omnipaque-300  Intravenous medications:  A total of 12 mg TPA was administered into the thrombus  PROCEDURE/FINDINGS:   Informed consent was obtained from the patient following explanation of the procedure, risks, benefits and alternatives. The patient understands, agrees and consents for the procedure. All questions were addressed. A time out was performed.  Maximal barrier sterile technique utilized including  caps, mask, sterile gowns, sterile gloves, large sterile drape, hand hygiene, and betadine skin prep.  The left popliteal fossa was interrogated with ultrasound.  The popliteal vein is expanded and completely thrombosed.  Local anesthesia was attained by  infiltration with 1% lidocaine.  Under real time sonographic guidance, the thrombosed vein was punctured with a 21-gauge micropuncture needle.  Images obtained stored for the medical record.  The transitional micro sheath was upsized to a 6-French working vascular sheath over a short Amplatz wire.  Using a stiff Glidewire and angled catheter, the catheter was used to navigate into the infrarenal IVC just beyond the nose cone of the IVC filter.  An inferior vena cava gram was performed limiting the amount of intravenous contrast used.  The left renal vein and inferior vena cava are patent.  Inflow from the right renal vein is also noted indicating patency.  The wire was advanced into the suprarenal IVC.  The 6-French 90 cm length AngioJet device was then used to power pulse spray a total of 6 mg of TPA and 125 ml of saline from the popliteal vein throughout the left lower extremity and into the IVC filter.  A 90 cm total length, 50 cm infusion length Unifuse infusion catheter was then positioned over the wire so that the proximal side hole was just within the filter, and the more proximal side hole in the mid thigh.  The sheath and catheter were secured to the skin with post silk suture.  The right popliteal fossa was interrogated with ultrasound.  Again, the popliteal vein was found be expanded completely thrombosed. Local anesthesia was attained by infiltration with 1% lidocaine. Under real time sonographic guidance, the vein was punctured using a 21-gauge micropuncture needle.  Images obtained stored for the medical record.  A 6-French working vascular sheath was then placed over a stiff Amplatz wire after transitioning from the transitional micro sheath.  The wire was advanced into the suprarenal IVC.  Care was taken to navigate the wire through the right aspect of the IVC and the filter to maximize lytic dispersal.  The 6-French AngioJet was then used to power pulse spray a total of 6 mg of TPA and 125 ml of  saline from the popliteal artery into the IVC.  A 130 cm total length 50 cm infusion length Unifuse multi side-hole infusion catheter was then placed in identical position to the contralateral side.  Initial venous lysis was initiated at 0.25 mg connector place through each catheter for a total of 0.5 mg per hour.  Overall, the patient tolerated the procedure very well.  There was no immediate complication.  IMPRESSION:  1.  Inferior venacavagram demonstrates patency of the renal veins and IVC above the level of the filter.  2. Initial limited bilateral lower extremity pharmacomechanical thrombolysis.  3.  Initiation of bilateral lower extremity venous lysis using bilateral multi side-hole infusion catheters.  The patient will lyse for 12-24 hours and return interventional radiology for venogram and potential adjunctive interventions tomorrow.  Signed,  Sterling Big, MD [facility] Radiology   Original Report Authenticated By: Malachy Moan, M.D.    Assessment/Plan: History of acute renal failure s/p turp with bilateral stent placement.  Pulmonary embolism on IV heparin. History of left renal capsular hematoma on IV heparin. IVC filter placed.  Extensive IVC and bilateral LE DVT s/p thrombolysis 9/28. Patient will have venogram performed today with possible intervention. Will continue to monitor H/H Q6hrs and fibrinogen Q6hrs.  Hematuria improving, light pink in color today.    LOS: 12 days    Cloretta Ned 05/12/2013 9:45 AM

## 2013-05-12 NOTE — Procedures (Signed)
Procedure:  Follow up angio during thrombolytic therapy; right iliac venous angioplasty Findings:  Markedly improved patency of bilateral lower extremity deep veins, iliac veins and IVC after overnight thrombolytic therapy. 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  Good antegrade flow bilaterally on completion.  IVC filter patent. Plan:  Discontinue lytic therapy.  Continue IV heparin per Pharmacy.

## 2013-05-12 NOTE — Progress Notes (Signed)
ANTICOAGULATION CONSULT NOTE - Follow Up Consult  Pharmacy Consult for heparin Indication: DVT  Labs:  Recent Labs  05/10/13 0445  05/11/13 1155  05/12/13 0545 05/12/13 1159 05/12/13 2235  HGB 9.4*  --   --   --  8.1*  --   --   HCT 29.2*  --   --   --  24.5*  --   --   PLT 303  --   --   --  210  --   --   APTT  --   --  78*  --   --   --   --   LABPROT  --   --  14.3  --   --   --   --   INR  --   --  1.13  --   --   --   --   HEPARINUNFRC 0.28*  < >  --   < > 0.40 0.35 0.33  CREATININE 1.52*  --   --   --  1.82*  --   --   < > = values in this interval not displayed.   Assessment/Plan:  67yo male therapeutic on heparin after resuming, tenecteplase now d/c'd.  Will continue gtt at current rate and continue to monitor closely.  Vernard Gambles, PharmD, BCPS  05/12/2013,11:12 PM

## 2013-05-12 NOTE — Progress Notes (Signed)
PULMONARY  / CRITICAL CARE MEDICINE  Name: Jack [name] MRN: [id number] DOB: 11-04-1945    ADMISSION DATE:  04/30/2013 CONSULTATION DATE:  05/12/2013  REFERRING MD :  Butler Denmark PRIMARY SERVICE: PCCM  CHIEF COMPLAINT:  DVT  BRIEF PATIENT DESCRIPTION: 67 y.o. male with complicated medical history including a recent stay at [facility] for PE S/P TURP 8/29  who required subsequent bilateral renal stents for obstruction due to hemorrhagic complications of anticoagulation.Pt was seen at [facility]  9/2 for V/Q showing likely PE. PT had CT showing L renal hematoma.9/5 - transferred to [facility] to PCCM service - noted to have large clot in bladder w/ B hydronephrosis 9/5 - IVC filter placement. In cone he demonstrated bilateral large clot burden DVT in lower ext and IVC was clotted.  Decision was made to perform a catheter directed lysis of clot and patient was transferred to the ICU for monitoring and further care while lytics are on.  SIGNIFICANT EVENTS / STUDIES:  8/29 - TURP w/ L ureteral stent [facility]  9/02 - admit [facility] w/ V/Q "high probability" for PE >> anticoag >> severe hematuria + anemia + renal failure  9/3 - CT abdom reveals large L renal capsular hematoma  9/5 - transferred to [facility] to PCCM service - noted to have large clot in bladder w/ B hydronephrosis  9/5 - IVC filter placement  9/6 - B ureteral stents placed - open evacuation of clot from bladder- suprapubic tube placed  9/7- venous duplex lower extremity - NO DVT  9/21-venous duplex lower extremity- Bilateral: Positive for DVT in the common femoral, profunda, femoral, popliteal, and posterior tibial veins with superficial thrombus in the greater and lesser saphenous veins.  9/22 - IV heparin initiated for DVTs (anticoagulation had been on hold due to renal hematoma)  9/28-Bilateral popliteal venous acc with initiation of bilateral LE and IVC thrombolysis, popliteal sheaths  LINES / TUBES: PIV 9/28 IR placed  bilateral sheaths for lytic drip Suprapubic catheter   CULTURES: 9/17 Blood>>>NTD 9/28 MRSA>> Neg   ANTIBIOTICS: None  SUBJECTIVE: C/O leg pain with movement and swelling. Resting comfortably and responds appropriately  VITAL SIGNS: Temp:  [97.3 F (36.3 C)-99 F (37.2 C)] 98.1 F (36.7 C) (09/29 0400) Pulse Rate:  [48-85] 66 (09/29 0700) Resp:  [14-29] 20 (09/29 0700) BP: (109-174)/(37-94) 126/49 mmHg (09/29 0800) SpO2:  [91 %-100 %] 96 % (09/29 0700) Weight:  [298 lb 15.1 oz (135.6 kg)-304 lb 7.3 oz (138.1 kg)] 304 lb 7.3 oz (138.1 kg) (09/29 0500)    INTAKE / OUTPUT: Intake/Output     09/28 0701 - 09/29 0700 09/29 0701 - 09/30 0700   P.O. 240    I.V. (mL/kg) 950 (6.9)    IV Piggyback 675 50   Total Intake(mL/kg) 1865 (13.5) 50 (0.4)   Urine (mL/kg/hr) 1880 (0.6) 300 (2)   Total Output 1880 300   Net -15 -250          PHYSICAL EXAMINATION: General:  Chronically ill appearing male, NAD Neuro:  Alert and interactive, moves all ext to command HEENT:  [place]/AT, PERRL, EOMI Cardiovascular:  RRR, no m/r/g Lungs:  Bibasilar crackles Abdomen:  Soft, NT, ND and +BS. GU: hematuria present coming from suprapubic catheter Extremities: Bilateral leg pitting edema, 2+, mild erythema on both legs, bilateral popliteal sheaths in place  LABS:  CBC Recent Labs     05/09/13  1653  05/10/13  0445  05/12/13  0545  WBC  9.3  9.2  8.3  HGB  9.5*  9.4*  8.1*  HCT  29.5*  29.2*  24.5*  PLT  313  303  210   Coag's Recent Labs     05/11/13  1155  APTT  78*  INR  1.13   BMET Recent Labs     05/10/13  0445  05/12/13  0545  NA  134*  138  K  4.6  4.1  CL  101  105  CO2  23  24  BUN  29*  38*  CREATININE  1.52*  1.82*  GLUCOSE  114*  100*   Electrolytes Recent Labs     05/10/13  0445  05/12/13  0545  CALCIUM  8.2*  8.4  MG   --   1.8  PHOS   --   3.7   Glucose Recent Labs     05/11/13  1941  GLUCAP  103*    Imaging Ir Veno/ext/bi  05/11/2013    *RADIOLOGY REPORT*  PRIOR ULTRASOUND GUIDED VASCULAR ACCESS; IR INFERIOR VENA CAVA GRAM; IR THROMBOLYSIS/THROMBECTOMY BILATERAL LOWER EXTREMITIES; IR INITIATION OF VENOUS LYSIS INITIAL DAY  Date: 05/11/2013  Clinical History: 67 year old male with complicated medical history.  He had a TURP and left double-J ureteral stent placement procedure done in the end of August which was complicated by postoperative PE and subsequent postoperative hemorrhage following initiation of anticoagulation.  Hemostasis was obtained and IVC filter was placed.  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  He is now nearly 4 weeks post operative and showing in significant improvement with systemic heparinization.  He presents for attempted pharmacomechanical catheter directed thrombolysis/thrombectomy.  Procedures Performed: 1. Ultrasound-guided puncture of the left popliteal vein 2.  Catheterization of the inferior vena cava above the IVC filter with vena cava gram 3.  Power pulse spray infusion of 6 mg of TPA from the popliteal access to the infrarenal IVC 4.  Placement of a 90 cm total length 50 cm infusion length multi side-hole infusion catheter 5.  Ultrasound-guided puncture of the right popliteal vein 6.  Catheterization of the inferior vena cava above the IVC filter 7.  Power pulse spray infusion of 6 mg of TPA from the right popliteal access into the infrarenal IVC 8.  Placement of a 130 cm total length 50 cm infusion length multi side-hole infusion catheter 9.  Initiation of venous thrombolysis  Interventional Radiologist:  Sterling Big, MD  Sedation: Moderate (conscious) sedation was used.  Four mg Versed, 100 mcg Fentanyl were administered intravenously.  The patient's vital signs were monitored continuously by radiology nursing throughout the procedure.  Sedation Time: 60 minutes   Fluoroscopy time: 15 minutes 6 seconds  Contrast volume: 10 ml Omnipaque-300  Intravenous medications:  A total of 12 mg TPA was administered into the thrombus  PROCEDURE/FINDINGS:   Informed consent was obtained from the patient following explanation of the procedure, risks, benefits and alternatives. The patient understands, agrees and consents for the procedure. All questions were addressed. A time out was performed.  Maximal barrier sterile technique utilized including caps, mask, sterile gowns, sterile gloves, large sterile drape, hand hygiene, and betadine skin prep.  The left popliteal fossa was interrogated with ultrasound.  The popliteal vein is expanded and completely thrombosed.  Local anesthesia was attained by infiltration with 1% lidocaine.  Under real time sonographic guidance, the thrombosed vein was punctured with a 21-gauge  micropuncture needle.  Images obtained stored for the medical record.  The transitional micro sheath was upsized to a 6-French working vascular sheath over a short Amplatz wire.  Using a stiff Glidewire and angled catheter, the catheter was used to navigate into the infrarenal IVC just beyond the nose cone of the IVC filter.  An inferior vena cava gram was performed limiting the amount of intravenous contrast used.  The left renal vein and inferior vena cava are patent.  Inflow from the right renal vein is also noted indicating patency.  The wire was advanced into the suprarenal IVC.  The 6-French 90 cm length AngioJet device was then used to power pulse spray a total of 6 mg of TPA and 125 ml of saline from the popliteal vein throughout the left lower extremity and into the IVC filter.  A 90 cm total length, 50 cm infusion length Unifuse infusion catheter was then positioned over the wire so that the proximal side hole was just within the filter, and the more proximal side hole in the mid thigh.  The sheath and catheter were secured to the skin with post silk suture.  The right  popliteal fossa was interrogated with ultrasound.  Again, the popliteal vein was found be expanded completely thrombosed. Local anesthesia was attained by infiltration with 1% lidocaine. Under real time sonographic guidance, the vein was punctured using a 21-gauge micropuncture needle.  Images obtained stored for the medical record.  A 6-French working vascular sheath was then placed over a stiff Amplatz wire after transitioning from the transitional micro sheath.  The wire was advanced into the suprarenal IVC.  Care was taken to navigate the wire through the right aspect of the IVC and the filter to maximize lytic dispersal.  The 6-French AngioJet was then used to power pulse spray a total of 6 mg of TPA and 125 ml of saline from the popliteal artery into the IVC.  A 130 cm total length 50 cm infusion length Unifuse multi side-hole infusion catheter was then placed in identical position to the contralateral side.  Initial venous lysis was initiated at 0.25 mg connector place through each catheter for a total of 0.5 mg per hour.  Overall, the patient tolerated the procedure very well.  There was no immediate complication.  IMPRESSION:  1.  Inferior venacavagram demonstrates patency of the renal veins and IVC above the level of the filter.  2. Initial limited bilateral lower extremity pharmacomechanical thrombolysis.  3.  Initiation of bilateral lower extremity venous lysis using bilateral multi side-hole infusion catheters.  The patient will lyse for 12-24 hours and return interventional radiology for venogram and potential adjunctive interventions tomorrow.  Signed,  Sterling Big, MD [facility] Radiology   Original Report Authenticated By: Malachy Moan, M.D.   Ir Caffie Damme Ivc  05/11/2013   *RADIOLOGY REPORT*  PRIOR ULTRASOUND GUIDED VASCULAR ACCESS; IR INFERIOR VENA CAVA GRAM; IR THROMBOLYSIS/THROMBECTOMY BILATERAL LOWER EXTREMITIES; IR INITIATION OF  VENOUS LYSIS INITIAL DAY  Date: 05/11/2013  Clinical History: 67 year old male with complicated medical history.  He had a TURP and left double-J ureteral stent placement procedure done in the end of August which was complicated by postoperative PE and subsequent postoperative hemorrhage following initiation of anticoagulation.  Hemostasis was obtained and IVC filter was placed.  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  He is now nearly  4 weeks post operative and showing in significant improvement with systemic heparinization.  He presents for attempted pharmacomechanical catheter directed thrombolysis/thrombectomy.  Procedures Performed: 1. Ultrasound-guided puncture of the left popliteal vein 2.  Catheterization of the inferior vena cava above the IVC filter with vena cava gram 3.  Power pulse spray infusion of 6 mg of TPA from the popliteal access to the infrarenal IVC 4.  Placement of a 90 cm total length 50 cm infusion length multi side-hole infusion catheter 5.  Ultrasound-guided puncture of the right popliteal vein 6.  Catheterization of the inferior vena cava above the IVC filter 7.  Power pulse spray infusion of 6 mg of TPA from the right popliteal access into the infrarenal IVC 8.  Placement of a 130 cm total length 50 cm infusion length multi side-hole infusion catheter 9.  Initiation of venous thrombolysis  Interventional Radiologist:  Sterling Big, MD  Sedation: Moderate (conscious) sedation was used.  Four mg Versed, 100 mcg Fentanyl were administered intravenously.  The patient's vital signs were monitored continuously by radiology nursing throughout the procedure.  Sedation Time: 60 minutes  Fluoroscopy time: 15 minutes 6 seconds  Contrast volume: 10 ml Omnipaque-300  Intravenous medications:  A total of 12 mg TPA was administered into the thrombus   PROCEDURE/FINDINGS:   Informed consent was obtained from the patient following explanation of the procedure, risks, benefits and alternatives. The patient understands, agrees and consents for the procedure. All questions were addressed. A time out was performed.  Maximal barrier sterile technique utilized including caps, mask, sterile gowns, sterile gloves, large sterile drape, hand hygiene, and betadine skin prep.  The left popliteal fossa was interrogated with ultrasound.  The popliteal vein is expanded and completely thrombosed.  Local anesthesia was attained by infiltration with 1% lidocaine.  Under real time sonographic guidance, the thrombosed vein was punctured with a 21-gauge micropuncture needle.  Images obtained stored for the medical record.  The transitional micro sheath was upsized to a 6-French working vascular sheath over a short Amplatz wire.  Using a stiff Glidewire and angled catheter, the catheter was used to navigate into the infrarenal IVC just beyond the nose cone of the IVC filter.  An inferior vena cava gram was performed limiting the amount of intravenous contrast used.  The left renal vein and inferior vena cava are patent.  Inflow from the right renal vein is also noted indicating patency.  The wire was advanced into the suprarenal IVC.  The 6-French 90 cm length AngioJet device was then used to power pulse spray a total of 6 mg of TPA and 125 ml of saline from the popliteal vein throughout the left lower extremity and into the IVC filter.  A 90 cm total length, 50 cm infusion length Unifuse infusion catheter was then positioned over the wire so that the proximal side hole was just within the filter, and the more proximal side hole in the mid thigh.  The sheath and catheter were secured to the skin with post silk suture.  The right popliteal fossa was interrogated with ultrasound.  Again, the popliteal vein was found be expanded completely thrombosed. Local anesthesia was attained by  infiltration with 1% lidocaine. Under real time sonographic guidance, the vein was punctured using a 21-gauge micropuncture needle.  Images obtained stored for the medical record.  A 6-French working vascular sheath was then placed over a stiff Amplatz wire after transitioning from the transitional micro sheath.  The wire was advanced into the  suprarenal IVC.  Care was taken to navigate the wire through the right aspect of the IVC and the filter to maximize lytic dispersal.  The 6-French AngioJet was then used to power pulse spray a total of 6 mg of TPA and 125 ml of saline from the popliteal artery into the IVC.  A 130 cm total length 50 cm infusion length Unifuse multi side-hole infusion catheter was then placed in identical position to the contralateral side.  Initial venous lysis was initiated at 0.25 mg connector place through each catheter for a total of 0.5 mg per hour.  Overall, the patient tolerated the procedure very well.  There was no immediate complication.  IMPRESSION:  1.  Inferior venacavagram demonstrates patency of the renal veins and IVC above the level of the filter.  2. Initial limited bilateral lower extremity pharmacomechanical thrombolysis.  3.  Initiation of bilateral lower extremity venous lysis using bilateral multi side-hole infusion catheters.  The patient will lyse for 12-24 hours and return interventional radiology for venogram and potential adjunctive interventions tomorrow.  Signed,  Sterling Big, MD [facility] Radiology   Original Report Authenticated By: Malachy Moan, M.D.   Ir Thrombect Veno Mech Mod Sed  05/11/2013   *RADIOLOGY REPORT*  PRIOR ULTRASOUND GUIDED VASCULAR ACCESS; IR INFERIOR VENA CAVA GRAM; IR THROMBOLYSIS/THROMBECTOMY BILATERAL LOWER EXTREMITIES; IR INITIATION OF VENOUS LYSIS INITIAL DAY  Date: 05/11/2013  Clinical History: 67 year old male with complicated medical history.  He had a TURP and left  double-J ureteral stent placement procedure done in the end of August which was complicated by postoperative PE and subsequent postoperative hemorrhage following initiation of anticoagulation.  Hemostasis was obtained and IVC filter was placed.  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  He is now nearly 4 weeks post operative and showing in significant improvement with systemic heparinization.  He presents for attempted pharmacomechanical catheter directed thrombolysis/thrombectomy.  Procedures Performed: 1. Ultrasound-guided puncture of the left popliteal vein 2.  Catheterization of the inferior vena cava above the IVC filter with vena cava gram 3.  Power pulse spray infusion of 6 mg of TPA from the popliteal access to the infrarenal IVC 4.  Placement of a 90 cm total length 50 cm infusion length multi side-hole infusion catheter 5.  Ultrasound-guided puncture of the right popliteal vein 6.  Catheterization of the inferior vena cava above the IVC filter 7.  Power pulse spray infusion of 6 mg of TPA from the right popliteal access into the infrarenal IVC 8.  Placement of a 130 cm total length 50 cm infusion length multi side-hole infusion catheter 9.  Initiation of venous thrombolysis  Interventional Radiologist:  Sterling Big, MD  Sedation: Moderate (conscious) sedation was used.  Four mg Versed, 100 mcg Fentanyl were administered intravenously.  The patient's vital signs were monitored continuously by radiology nursing throughout the procedure.  Sedation Time: 60 minutes  Fluoroscopy time: 15 minutes 6 seconds  Contrast volume: 10 ml Omnipaque-300  Intravenous medications:  A total of 12 mg TPA was administered into the thrombus  PROCEDURE/FINDINGS:   Informed consent was obtained from the patient following explanation of the procedure, risks, benefits and alternatives.  The patient understands, agrees and consents for the procedure. All questions were addressed. A time out was performed.  Maximal barrier sterile technique utilized including caps, mask, sterile gowns, sterile gloves, large sterile  drape, hand hygiene, and betadine skin prep.  The left popliteal fossa was interrogated with ultrasound.  The popliteal vein is expanded and completely thrombosed.  Local anesthesia was attained by infiltration with 1% lidocaine.  Under real time sonographic guidance, the thrombosed vein was punctured with a 21-gauge micropuncture needle.  Images obtained stored for the medical record.  The transitional micro sheath was upsized to a 6-French working vascular sheath over a short Amplatz wire.  Using a stiff Glidewire and angled catheter, the catheter was used to navigate into the infrarenal IVC just beyond the nose cone of the IVC filter.  An inferior vena cava gram was performed limiting the amount of intravenous contrast used.  The left renal vein and inferior vena cava are patent.  Inflow from the right renal vein is also noted indicating patency.  The wire was advanced into the suprarenal IVC.  The 6-French 90 cm length AngioJet device was then used to power pulse spray a total of 6 mg of TPA and 125 ml of saline from the popliteal vein throughout the left lower extremity and into the IVC filter.  A 90 cm total length, 50 cm infusion length Unifuse infusion catheter was then positioned over the wire so that the proximal side hole was just within the filter, and the more proximal side hole in the mid thigh.  The sheath and catheter were secured to the skin with post silk suture.  The right popliteal fossa was interrogated with ultrasound.  Again, the popliteal vein was found be expanded completely thrombosed. Local anesthesia was attained by infiltration with 1% lidocaine. Under real time sonographic guidance, the vein was punctured using a 21-gauge micropuncture needle.  Images obtained  stored for the medical record.  A 6-French working vascular sheath was then placed over a stiff Amplatz wire after transitioning from the transitional micro sheath.  The wire was advanced into the suprarenal IVC.  Care was taken to navigate the wire through the right aspect of the IVC and the filter to maximize lytic dispersal.  The 6-French AngioJet was then used to power pulse spray a total of 6 mg of TPA and 125 ml of saline from the popliteal artery into the IVC.  A 130 cm total length 50 cm infusion length Unifuse multi side-hole infusion catheter was then placed in identical position to the contralateral side.  Initial venous lysis was initiated at 0.25 mg connector place through each catheter for a total of 0.5 mg per hour.  Overall, the patient tolerated the procedure very well.  There was no immediate complication.  IMPRESSION:  1.  Inferior venacavagram demonstrates patency of the renal veins and IVC above the level of the filter.  2. Initial limited bilateral lower extremity pharmacomechanical thrombolysis.  3.  Initiation of bilateral lower extremity venous lysis using bilateral multi side-hole infusion catheters.  The patient will lyse for 12-24 hours and return interventional radiology for venogram and potential adjunctive interventions tomorrow.  Signed,  Sterling Big, MD [facility] Radiology   Original Report Authenticated By: Malachy Moan, M.D.   Ir Thrombect Veno Mech Mod Sed  05/11/2013   *RADIOLOGY REPORT*  PRIOR ULTRASOUND GUIDED VASCULAR ACCESS; IR INFERIOR VENA CAVA GRAM; IR THROMBOLYSIS/THROMBECTOMY BILATERAL LOWER EXTREMITIES; IR INITIATION OF VENOUS LYSIS INITIAL DAY  Date: 05/11/2013  Clinical History: 67 year old male with complicated medical history.  He had a TURP and left double-J ureteral stent placement procedure done in the end of August which was complicated  by postoperative PE and subsequent postoperative hemorrhage  following initiation of anticoagulation.  Hemostasis was obtained and IVC filter was placed.  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  He is now nearly 4 weeks post operative and showing in significant improvement with systemic heparinization.  He presents for attempted pharmacomechanical catheter directed thrombolysis/thrombectomy.  Procedures Performed: 1. Ultrasound-guided puncture of the left popliteal vein 2.  Catheterization of the inferior vena cava above the IVC filter with vena cava gram 3.  Power pulse spray infusion of 6 mg of TPA from the popliteal access to the infrarenal IVC 4.  Placement of a 90 cm total length 50 cm infusion length multi side-hole infusion catheter 5.  Ultrasound-guided puncture of the right popliteal vein 6.  Catheterization of the inferior vena cava above the IVC filter 7.  Power pulse spray infusion of 6 mg of TPA from the right popliteal access into the infrarenal IVC 8.  Placement of a 130 cm total length 50 cm infusion length multi side-hole infusion catheter 9.  Initiation of venous thrombolysis  Interventional Radiologist:  Sterling Big, MD  Sedation: Moderate (conscious) sedation was used.  Four mg Versed, 100 mcg Fentanyl were administered intravenously.  The patient's vital signs were monitored continuously by radiology nursing throughout the procedure.  Sedation Time: 60 minutes  Fluoroscopy time: 15 minutes 6 seconds  Contrast volume: 10 ml Omnipaque-300  Intravenous medications:  A total of 12 mg TPA was administered into the thrombus  PROCEDURE/FINDINGS:   Informed consent was obtained from the patient following explanation of the procedure, risks, benefits and alternatives. The patient understands, agrees and consents for the procedure. All questions were addressed. A time out was performed.  Maximal barrier sterile  technique utilized including caps, mask, sterile gowns, sterile gloves, large sterile drape, hand hygiene, and betadine skin prep.  The left popliteal fossa was interrogated with ultrasound.  The popliteal vein is expanded and completely thrombosed.  Local anesthesia was attained by infiltration with 1% lidocaine.  Under real time sonographic guidance, the thrombosed vein was punctured with a 21-gauge micropuncture needle.  Images obtained stored for the medical record.  The transitional micro sheath was upsized to a 6-French working vascular sheath over a short Amplatz wire.  Using a stiff Glidewire and angled catheter, the catheter was used to navigate into the infrarenal IVC just beyond the nose cone of the IVC filter.  An inferior vena cava gram was performed limiting the amount of intravenous contrast used.  The left renal vein and inferior vena cava are patent.  Inflow from the right renal vein is also noted indicating patency.  The wire was advanced into the suprarenal IVC.  The 6-French 90 cm length AngioJet device was then used to power pulse spray a total of 6 mg of TPA and 125 ml of saline from the popliteal vein throughout the left lower extremity and into the IVC filter.  A 90 cm total length, 50 cm infusion length Unifuse infusion catheter was then positioned over the wire so that the proximal side hole was just within the filter, and the more proximal side hole in the mid thigh.  The sheath and catheter were secured to the skin with post silk suture.  The right popliteal fossa was interrogated with ultrasound.  Again, the popliteal vein was found be expanded completely thrombosed. Local anesthesia was attained by infiltration with 1%  lidocaine. Under real time sonographic guidance, the vein was punctured using a 21-gauge micropuncture needle.  Images obtained stored for the medical record.  A 6-French working vascular sheath was then placed over a stiff Amplatz wire after transitioning from the  transitional micro sheath.  The wire was advanced into the suprarenal IVC.  Care was taken to navigate the wire through the right aspect of the IVC and the filter to maximize lytic dispersal.  The 6-French AngioJet was then used to power pulse spray a total of 6 mg of TPA and 125 ml of saline from the popliteal artery into the IVC.  A 130 cm total length 50 cm infusion length Unifuse multi side-hole infusion catheter was then placed in identical position to the contralateral side.  Initial venous lysis was initiated at 0.25 mg connector place through each catheter for a total of 0.5 mg per hour.  Overall, the patient tolerated the procedure very well.  There was no immediate complication.  IMPRESSION:  1.  Inferior venacavagram demonstrates patency of the renal veins and IVC above the level of the filter.  2. Initial limited bilateral lower extremity pharmacomechanical thrombolysis.  3.  Initiation of bilateral lower extremity venous lysis using bilateral multi side-hole infusion catheters.  The patient will lyse for 12-24 hours and return interventional radiology for venogram and potential adjunctive interventions tomorrow.  Signed,  Sterling Big, MD [facility]   Original Report Authenticated By: Malachy Moan, M.D.   Ir US Guide Vasc Access Left  05/11/2013   *RADIOLOGY REPORT*  PRIOR ULTRASOUND GUIDED VASCULAR ACCESS; IR INFERIOR VENA CAVA GRAM; IR THROMBOLYSIS/THROMBECTOMY BILATERAL LOWER EXTREMITIES; IR INITIATION OF VENOUS LYSIS INITIAL DAY  Date: 05/11/2013  Clinical History: 67 year old male with complicated medical history.  He had a TURP and left double-J ureteral stent placement procedure done in the end of August which was complicated by postoperative PE and subsequent postoperative hemorrhage following initiation of anticoagulation.  Hemostasis was obtained and IVC filter was placed.  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  He is now nearly 4 weeks post operative and showing in significant improvement with systemic heparinization.  He presents for attempted pharmacomechanical catheter directed thrombolysis/thrombectomy.  Procedures Performed: 1. Ultrasound-guided puncture of the left popliteal vein 2.  Catheterization of the inferior vena cava above the IVC filter with vena cava gram 3.  Power pulse spray infusion of 6 mg of TPA from the popliteal access to the infrarenal IVC 4.  Placement of a 90 cm total length 50 cm infusion length multi side-hole infusion catheter 5.  Ultrasound-guided puncture of the right popliteal vein 6.  Catheterization of the inferior vena cava above the IVC filter 7.  Power pulse spray infusion of 6 mg of TPA from the right popliteal access into the infrarenal IVC 8.  Placement of a 130 cm total length 50 cm infusion length multi side-hole infusion catheter 9.  Initiation of venous thrombolysis  Interventional Radiologist:  Sterling Big, MD  Sedation: Moderate (conscious) sedation was used.  Four mg Versed, 100 mcg Fentanyl were administered intravenously.  The patient's vital signs were monitored continuously by radiology nursing throughout the procedure.  Sedation Time: 60 minutes  Fluoroscopy time: 15 minutes 6 seconds  Contrast volume: 10 ml Omnipaque-300  Intravenous medications:  A total of 12 mg TPA was administered into the thrombus  PROCEDURE/FINDINGS:  Informed consent was obtained from the patient following explanation of the procedure, risks, benefits and alternatives. The patient understands, agrees and consents for the procedure. All questions were addressed. A time out was performed.  Maximal barrier sterile technique utilized including caps, mask, sterile gowns, sterile gloves, large sterile drape, hand hygiene, and betadine skin prep.  The left  popliteal fossa was interrogated with ultrasound.  The popliteal vein is expanded and completely thrombosed.  Local anesthesia was attained by infiltration with 1% lidocaine.  Under real time sonographic guidance, the thrombosed vein was punctured with a 21-gauge micropuncture needle.  Images obtained stored for the medical record.  The transitional micro sheath was upsized to a 6-French working vascular sheath over a short Amplatz wire.  Using a stiff Glidewire and angled catheter, the catheter was used to navigate into the infrarenal IVC just beyond the nose cone of the IVC filter.  An inferior vena cava gram was performed limiting the amount of intravenous contrast used.  The left renal vein and inferior vena cava are patent.  Inflow from the right renal vein is also noted indicating patency.  The wire was advanced into the suprarenal IVC.  The 6-French 90 cm length AngioJet device was then used to power pulse spray a total of 6 mg of TPA and 125 ml of saline from the popliteal vein throughout the left lower extremity and into the IVC filter.  A 90 cm total length, 50 cm infusion length Unifuse infusion catheter was then positioned over the wire so that the proximal side hole was just within the filter, and the more proximal side hole in the mid thigh.  The sheath and catheter were secured to the skin with post silk suture.  The right popliteal fossa was interrogated with ultrasound.  Again, the popliteal vein was found be expanded completely thrombosed. Local anesthesia was attained by infiltration with 1% lidocaine. Under real time sonographic guidance, the vein was punctured using a 21-gauge micropuncture needle.  Images obtained stored for the medical record.  A 6-French working vascular sheath was then placed over a stiff Amplatz wire after transitioning from the transitional micro sheath.  The wire was advanced into the suprarenal IVC.  Care was taken to navigate the wire through the right aspect of the IVC  and the filter to maximize lytic dispersal.  The 6-French AngioJet was then used to power pulse spray a total of 6 mg of TPA and 125 ml of saline from the popliteal artery into the IVC.  A 130 cm total length 50 cm infusion length Unifuse multi side-hole infusion catheter was then placed in identical position to the contralateral side.  Initial venous lysis was initiated at 0.25 mg connector place through each catheter for a total of 0.5 mg per hour.  Overall, the patient tolerated the procedure very well.  There was no immediate complication.  IMPRESSION:  1.  Inferior venacavagram demonstrates patency of the renal veins and IVC above the level of the filter.  2. Initial limited bilateral lower extremity pharmacomechanical thrombolysis.  3.  Initiation of bilateral lower extremity venous lysis using bilateral multi side-hole infusion catheters.  The patient will lyse for 12-24 hours and return interventional radiology for venogram and potential adjunctive interventions tomorrow.  Signed,  Sterling Big, MD [facility]   Original Report Authenticated By: Malachy Moan, M.D.   Ir US Guide Vasc Access Right  05/11/2013   *RADIOLOGY REPORT*  PRIOR ULTRASOUND GUIDED VASCULAR ACCESS;  IR INFERIOR VENA CAVA GRAM; IR THROMBOLYSIS/THROMBECTOMY BILATERAL LOWER EXTREMITIES; IR INITIATION OF VENOUS LYSIS INITIAL DAY  Date: 05/11/2013  Clinical History: 67 year old male with complicated medical history.  He had a TURP and left double-J ureteral stent placement procedure done in the end of August which was complicated by postoperative PE and subsequent postoperative hemorrhage following initiation of anticoagulation.  Hemostasis was obtained and IVC filter was placed.  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  He is now nearly  4 weeks post operative and showing in significant improvement with systemic heparinization.  He presents for attempted pharmacomechanical catheter directed thrombolysis/thrombectomy.  Procedures Performed: 1. Ultrasound-guided puncture of the left popliteal vein 2.  Catheterization of the inferior vena cava above the IVC filter with vena cava gram 3.  Power pulse spray infusion of 6 mg of TPA from the popliteal access to the infrarenal IVC 4.  Placement of a 90 cm total length 50 cm infusion length multi side-hole infusion catheter 5.  Ultrasound-guided puncture of the right popliteal vein 6.  Catheterization of the inferior vena cava above the IVC filter 7.  Power pulse spray infusion of 6 mg of TPA from the right popliteal access into the infrarenal IVC 8.  Placement of a 130 cm total length 50 cm infusion length multi side-hole infusion catheter 9.  Initiation of venous thrombolysis  Interventional Radiologist:  Sterling Big, MD  Sedation: Moderate (conscious) sedation was used.  Four mg Versed, 100 mcg Fentanyl were administered intravenously.  The patient's vital signs were monitored continuously by radiology nursing throughout the procedure.  Sedation Time: 60 minutes  Fluoroscopy time: 15 minutes 6 seconds  Contrast volume: 10 ml Omnipaque-300  Intravenous medications:  A total of 12 mg TPA was administered into the thrombus  PROCEDURE/FINDINGS:   Informed consent was obtained from the patient following explanation of the procedure, risks, benefits and alternatives. The patient understands, agrees and consents for the procedure. All questions were addressed. A time out was performed.  Maximal barrier sterile technique utilized including caps, mask, sterile gowns, sterile gloves, large sterile drape, hand hygiene, and betadine skin prep.  The left popliteal fossa was interrogated with ultrasound.  The popliteal vein is expanded and completely thrombosed.  Local anesthesia was attained by infiltration  with 1% lidocaine.  Under real time sonographic guidance, the thrombosed vein was punctured with a 21-gauge micropuncture needle.  Images obtained stored for the medical record.  The transitional micro sheath was upsized to a 6-French working vascular sheath over a short Amplatz wire.  Using a stiff Glidewire and angled catheter, the catheter was used to navigate into the infrarenal IVC just beyond the nose cone of the IVC filter.  An inferior vena cava gram was performed limiting the amount of intravenous contrast used.  The left renal vein and inferior vena cava are patent.  Inflow from the right renal vein is also noted indicating patency.  The wire was advanced into the suprarenal IVC.  The 6-French 90 cm length AngioJet device was then used to power pulse spray a total of 6 mg of TPA and 125 ml of saline from the popliteal vein throughout the left lower extremity and into the IVC filter.  A 90 cm total length, 50 cm infusion length Unifuse infusion catheter was then positioned over the wire so that the proximal side hole was just within the filter, and the  more proximal side hole in the mid thigh.  The sheath and catheter were secured to the skin with post silk suture.  The right popliteal fossa was interrogated with ultrasound.  Again, the popliteal vein was found be expanded completely thrombosed. Local anesthesia was attained by infiltration with 1% lidocaine. Under real time sonographic guidance, the vein was punctured using a 21-gauge micropuncture needle.  Images obtained stored for the medical record.  A 6-French working vascular sheath was then placed over a stiff Amplatz wire after transitioning from the transitional micro sheath.  The wire was advanced into the suprarenal IVC.  Care was taken to navigate the wire through the right aspect of the IVC and the filter to maximize lytic dispersal.  The 6-French AngioJet was then used to power pulse spray a total of 6 mg of TPA and 125 ml of saline from the  popliteal artery into the IVC.  A 130 cm total length 50 cm infusion length Unifuse multi side-hole infusion catheter was then placed in identical position to the contralateral side.  Initial venous lysis was initiated at 0.25 mg connector place through each catheter for a total of 0.5 mg per hour.  Overall, the patient tolerated the procedure very well.  There was no immediate complication.  IMPRESSION:  1.  Inferior venacavagram demonstrates patency of the renal veins and IVC above the level of the filter.  2. Initial limited bilateral lower extremity pharmacomechanical thrombolysis.  3.  Initiation of bilateral lower extremity venous lysis using bilateral multi side-hole infusion catheters.  The patient will lyse for 12-24 hours and return interventional radiology for venogram and potential adjunctive interventions tomorrow.  Signed,  Sterling Big, MD [facility] Radiology   Original Report Authenticated By: Malachy Moan, M.D.   Ct Angio Abd/pel W/ And/or W/o  05/10/2013   *RADIOLOGY REPORT*  Clinical Data: Extensive bilateral DVT, evaluate for caval thrombus.  CT ANGIOGRAPHY ABDOMEN AND PELVIS WITH CONTRAST AND WITHOUT CONTRAST  Technique:  Multidetector CT imaging of the abdomen and pelvis was performed following the standard protocol during bolus administration of intravenous contrast.  Comparison: Prior CT abdomen/pelvis 04/30/2013  Findings:  Lower Chest:  Mild dependent atelectasis versus scarring in the posterior left lower lobe.  The lung bases are otherwise clear. Visualized heart within normal limits for size.  No pericardial effusion.  Unremarkable distal thoracic esophagus.  Abdomen: Unremarkable CT appearance of the stomach, duodenum, spleen, pancreas and left adrenal gland.  Dystrophic calcification in the right adrenal gland is nonspecific, but stable.  Normal hepatic contours and morphology.  No focal hepatic mass. Gallbladder is  unremarkable. No intra or extrahepatic biliary ductal dilatation.  Stable 7.7 cm simple cyst exophytic from the upper pole of the left kidney.  Stable low attenuation left subcapsular fluid collection, likely a liquefied subcapsular hematoma.  No hydronephrosis. Bilateral double J ureteral stents are in place.  There are multiple small parapelvic cysts in the right kidney.  Small exophytic renal cortical cyst on the right.  Symmetric bilateral renal perfusion.  No bowel obstruction or focal bowel wall thickening.  No free fluid or suspicious adenopathy.  Pelvis: The bladder is decompressed.  There is a suprapubic catheter as well as to double-J ureteral stents.  Surgical changes of prior TURP.  No free fluid or suspicious adenopathy.  Bones: No acute fracture or aggressive appearing lytic or blastic osseous lesion.  Vascular: Extensive thrombus beginning in the inferior vena cava contained within the IVC filter and  extending through out of the bilateral iliac and femoral venous systems.  Thrombus likely extends into the superficial great saphenous veins bilaterally as well.  No significant provocation superior to the filter.  The renal veins are patent bilaterally.  Mild scattered atherosclerotic vascular calcifications.  No acute arterial abnormality.  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  No evidence of propagation more centrally within the inferior vena cava.  The bilateral renal veins remain widely patent.  Additional ancillary findings as above without significant interval change.   Original Report Authenticated By: Malachy Moan, M.D.   Ir Infusion Thrombol Venous Initial (ms)  05/11/2013   *RADIOLOGY REPORT*  PRIOR ULTRASOUND GUIDED VASCULAR ACCESS; IR INFERIOR VENA CAVA GRAM; IR THROMBOLYSIS/THROMBECTOMY BILATERAL LOWER EXTREMITIES; IR INITIATION OF VENOUS LYSIS INITIAL DAY  Date: 05/11/2013  Clinical History: 67 year old  male with complicated medical history.  He had a TURP and left double-J ureteral stent placement procedure done in the end of August which was complicated by postoperative PE and subsequent postoperative hemorrhage following initiation of anticoagulation.  Hemostasis was obtained and IVC filter was placed.  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  He is now nearly 4 weeks post operative and showing in significant improvement with systemic heparinization.  He presents for attempted pharmacomechanical catheter directed thrombolysis/thrombectomy.  Procedures Performed: 1. Ultrasound-guided puncture of the left popliteal vein 2.  Catheterization of the inferior vena cava above the IVC filter with vena cava gram 3.  Power pulse spray infusion of 6 mg of TPA from the popliteal access to the infrarenal IVC 4.  Placement of a 90 cm total length 50 cm infusion length multi side-hole infusion catheter 5.  Ultrasound-guided puncture of the right popliteal vein 6.  Catheterization of the inferior vena cava above the IVC filter 7.  Power pulse spray infusion of 6 mg of TPA from the right popliteal access into the infrarenal IVC 8.  Placement of a 130 cm total length 50 cm infusion length multi side-hole infusion catheter 9.  Initiation of venous thrombolysis  Interventional Radiologist:  Sterling Big, MD  Sedation: Moderate (conscious) sedation was used.  Four mg Versed, 100 mcg Fentanyl were administered intravenously.  The patient's vital signs were monitored continuously by radiology nursing throughout the procedure.  Sedation Time: 60 minutes  Fluoroscopy time: 15 minutes 6 seconds  Contrast volume: 10 ml Omnipaque-300  Intravenous medications:  A total of 12 mg TPA was administered into the thrombus  PROCEDURE/FINDINGS:   Informed consent was obtained from the patient following  explanation of the procedure, risks, benefits and alternatives. The patient understands, agrees and consents for the procedure. All questions were addressed. A time out was performed.  Maximal barrier sterile technique utilized including caps, mask, sterile gowns, sterile gloves, large sterile drape, hand hygiene, and betadine skin prep.  The left popliteal fossa was interrogated with ultrasound.  The popliteal vein is expanded and completely thrombosed.  Local anesthesia was attained by infiltration with 1% lidocaine.  Under real time sonographic guidance, the thrombosed vein was punctured with a 21-gauge micropuncture needle.  Images obtained stored for the medical record.  The transitional micro sheath was upsized to a 6-French working vascular sheath over a short Amplatz wire.  Using a stiff Glidewire and angled catheter, the catheter was used to navigate into the infrarenal IVC just beyond the  nose cone of the IVC filter.  An inferior vena cava gram was performed limiting the amount of intravenous contrast used.  The left renal vein and inferior vena cava are patent.  Inflow from the right renal vein is also noted indicating patency.  The wire was advanced into the suprarenal IVC.  The 6-French 90 cm length AngioJet device was then used to power pulse spray a total of 6 mg of TPA and 125 ml of saline from the popliteal vein throughout the left lower extremity and into the IVC filter.  A 90 cm total length, 50 cm infusion length Unifuse infusion catheter was then positioned over the wire so that the proximal side hole was just within the filter, and the more proximal side hole in the mid thigh.  The sheath and catheter were secured to the skin with post silk suture.  The right popliteal fossa was interrogated with ultrasound.  Again, the popliteal vein was found be expanded completely thrombosed. Local anesthesia was attained by infiltration with 1% lidocaine. Under real time sonographic guidance, the vein was  punctured using a 21-gauge micropuncture needle.  Images obtained stored for the medical record.  A 6-French working vascular sheath was then placed over a stiff Amplatz wire after transitioning from the transitional micro sheath.  The wire was advanced into the suprarenal IVC.  Care was taken to navigate the wire through the right aspect of the IVC and the filter to maximize lytic dispersal.  The 6-French AngioJet was then used to power pulse spray a total of 6 mg of TPA and 125 ml of saline from the popliteal artery into the IVC.  A 130 cm total length 50 cm infusion length Unifuse multi side-hole infusion catheter was then placed in identical position to the contralateral side.  Initial venous lysis was initiated at 0.25 mg connector place through each catheter for a total of 0.5 mg per hour.  Overall, the patient tolerated the procedure very well.  There was no immediate complication.  IMPRESSION:  1.  Inferior venacavagram demonstrates patency of the renal veins and IVC above the level of the filter.  2. Initial limited bilateral lower extremity pharmacomechanical thrombolysis.  3.  Initiation of bilateral lower extremity venous lysis using bilateral multi side-hole infusion catheters.  The patient will lyse for 12-24 hours and return interventional radiology for venogram and potential adjunctive interventions tomorrow.  Signed,  Sterling Big, MD [facility] Radiology   Original Report Authenticated By: Malachy Moan, M.D.   ASSESSMENT / PLAN:  PULMONARY A:  No respiratory concerns Hx PE   IVC filter placed P:   - Titrate O2. - IS per RT protocol. -Sit upright when able -pcxr for atx in am   CARDIOVASCULAR A:  DVT/PE Marginal BP prior- on Midodrine Recent asymptomatic brady- preload reduction from clotted filter P:  - Continue midodrine, consider dc this as sys 140 and not home med - Tele monitoring -tsh -allow pos  baalnce  RENAL A:  Renal failure with left sided hematoma - tPA hematuria P:   - Monitor BMET. - allow pos  Balance with clotted filter  GASTROINTESTINAL A:  NPO P:   - Heart healthy diet after procedure - Liquids only - monitor for any clots in urine  HEMATOLOGIC A:   tPA given as a drip for DVT / clot filter P:  - Monitor closely for signs of bleeding. - H&H q6 x4. -continue TNK at .125mg /hr  -continue heparin  -  repeat Duplex U/S for DVT - monitor heparin levels -If he is tolerating anticoagulants so well, can we dc filter?  -If mental status change = STAT CT head, prepare for cyro, ensure neurochecks  INFECTIOUS A:   No active issues. P:   - Monitor WBC and fever curve.  ENDOCRINE A:   No active issues. P:   - Monitor.  NEUROLOGIC A:   No active issues. Pt resting comfortably with positive outlook on recovery P: neurochecks on lytics  I have personally obtained a history, examined the patient, evaluated laboratory and imaging results, formulated the assessment and plan and placed orders.  05/12/2013, 8:07 AM   Mcarthur Rossetti. Tyson Alias, MD, FACP Pgr: [phone] [facility]

## 2013-05-12 NOTE — Progress Notes (Signed)
ANTICOAGULATION CONSULT NOTE - Follow Up Consult  Pharmacy Consult for heparin Indication: DVT  Labs:  Recent Labs  05/09/13 0400 05/09/13 1653 05/10/13 0445  05/10/13 1851 05/11/13 0535 05/11/13 1155 05/12/13 0015  HGB 10.4* 9.5* 9.4*  --   --   --   --   --   HCT 33.1* 29.5* 29.2*  --   --   --   --   --   PLT 333 313 303  --   --   --   --   --   APTT  --   --   --   --   --   --  78*  --   LABPROT  --   --   --   --   --   --  14.3  --   INR  --   --   --   --   --   --  1.13  --   HEPARINUNFRC 0.48  --  0.28*  < > 0.54 0.50  --  0.34  CREATININE 1.63*  --  1.52*  --   --   --   --   --   < > = values in this interval not displayed.   Assessment/Plan:  67yo male remains therapeutic on heparin with low goal given tenecteplase for DVT.  Will continue gtt at current rate and continue to monitor closely.  Vernard Gambles, PharmD, BCPS  05/12/2013,1:05 AM

## 2013-05-12 NOTE — Progress Notes (Signed)
Per DR Mccullough, use heparnized NS, 500 cc NS with 1000 u heparin per 500 NS for popliteal sheath area Infuse at 10 cc/hr into each leg

## 2013-05-12 NOTE — Progress Notes (Signed)
Jack [name] was transported to IR via bed.  Tenectaplase infusing into bilateral popliteal sheaths.

## 2013-05-12 NOTE — ED Notes (Signed)
Bilateral popliteal sheaths pulled. Manual pressure held. Slight bruising at each site.

## 2013-05-12 NOTE — Progress Notes (Signed)
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 Contrast Media [Iodinated Diagnostic Agents]     On 04/29/2013 spoke with patient he had some type of procedure at [facility] and broke out in hives after the exams in 1997, he did not know what of type of study he had.  I called [facility] Radiology department the only type of contrast they used during that time  was [place]   Patient Measurements: Height: 6' (182.9 cm) Weight: 304 lb 7.3 oz (138.1 kg) IBW/kg (Calculated) : 77.6 Heparin Dosing Weight: 109 kg Vital Signs: Temp: 97.7 F (36.5 C) (09/29 1200) Temp src: Oral (09/29 1200) BP: 138/70 mmHg (09/29 1423) Pulse Rate: 97 (09/29 1423) Labs:  Recent Labs  05/09/13 1653 05/10/13 0445  05/11/13 1155 05/12/13 0015 05/12/13 0545 05/12/13 1159  HGB 9.5* 9.4*  --   --   --  8.1*  --   HCT 29.5* 29.2*  --   --   --  24.5*  --   PLT 313 303  --   --   --  210  --   APTT  --   --   --  78*  --   --   --   LABPROT  --   --   --  14.3  --   --   --   INR  --   --   --  1.13  --   --   --   HEPARINUNFRC  --  0.28*  < >  --  0.34 0.40 0.35  CREATININE  --  1.52*  --   --   --  1.82*  --   < > = values in this interval not displayed.  Estimated Creatinine Clearance: 57.5 ml/min (by C-G formula based on Cr of 1.82).  Medications:  Infusions:  . sodium chloride 10 mL/hr at 05/11/13 1900  . 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 Patient was also on TNKase overnight but repeat angiogram shows increased patency and TNKase has been discontinued. Heparin level was therapeutic this AM.   Goal of Therapy:  Heparin level 0.3-0.7 units/ml - now off TNKase Monitor platelets by anticoagulation protocol: Yes   Plan:  1. Restart Heparin level at same rate at 2000 units/hr.  2. Recheck heparin level at 2300.   Link Snuffer, PharmD, BCPS Clinical Pharmacist [phone] 05/12/2013,2:28 PM

## 2013-05-13 ENCOUNTER — Inpatient Hospital Stay (HOSPITAL_COMMUNITY): Payer: PRIVATE HEALTH INSURANCE

## 2013-05-13 ENCOUNTER — Encounter (HOSPITAL_COMMUNITY): Payer: Self-pay

## 2013-05-13 LAB — BASIC METABOLIC PANEL
GFR calc Af Amer: 48 mL/min — ABNORMAL LOW (ref >90)
GFR calc non Af Amer: 41 mL/min — ABNORMAL LOW (ref >90)
Potassium: 4.2 mEq/L (ref 3.5–5.1)
Sodium: 137 mEq/L (ref 135–145)

## 2013-05-13 LAB — HEPARIN LEVEL (UNFRACTIONATED): Heparin Unfractionated: 0.34 IU/mL (ref 0.30–0.70)

## 2013-05-13 LAB — OCCULT BLOOD X 1 CARD TO LAB, STOOL: Fecal Occult Bld: POSITIVE — AB

## 2013-05-13 LAB — CBC
Hemoglobin: 7 g/dL — ABNORMAL LOW (ref 13.0–17.0)
MCH: 29.9 pg (ref 26.0–34.0)
MCHC: 33.5 g/dL (ref 30.0–36.0)
Platelets: 191 10*3/uL (ref 150–400)
Platelets: 172 10*3/uL (ref 150–400)
RBC: 2.34 MIL/uL — ABNORMAL LOW (ref 4.22–5.81)
RDW: 16.5 % — ABNORMAL HIGH (ref 11.5–15.5)
RDW: 16.5 % — ABNORMAL HIGH (ref 11.5–15.5)
WBC: 6.5 10*3/uL (ref 4.0–10.5)

## 2013-05-13 NOTE — Progress Notes (Signed)
Subjective: IVC and B Low extr DVT thrombolysis 9/17-May-2028 Good result pta Rt iliac Vein Resting well  Objective: Vital signs in last 24 hours: Temp:  [97.6 F (36.4 C)-98.8 F (37.1 C)] 98.8 F (37.1 C) (09/30 0405) Pulse Rate:  [70-108] 85 (09/30 0500) Resp:  [10-28] 22 (09/30 0500) BP: (113-158)/(46-103) 126/55 mmHg (09/30 0500) SpO2:  [90 %-100 %] 96 % (09/30 0500) Weight:  [303 lb 12.7 oz (137.8 kg)] 303 lb 12.7 oz (137.8 kg) (09/30 0500) Last BM Date: 05/12/13  Intake/Output from previous day: 09/29 0701 - 09/30 0700 In: 1170 [P.O.:440; I.V.:580; IV Piggyback:150] Out: 2085 [Urine:2085] Intake/Output this shift:    PE: afeb; vss Resting Legs are both with less swelling daily B popliteal lysis sites w/o hematoma NT; no bleeding  Lab Results:   Recent Labs  05/12/13 0545 05/13/13 0355  WBC 8.3 7.1  HGB 8.1* 7.1*  HCT 24.5* 21.2*  PLT 210 191   BMET  Recent Labs  05/12/13 0545 05/13/13 0355  NA 138 137  K 4.1 4.2  CL 105 107  CO2 24 22  GLUCOSE 100* 109*  BUN 38* 36*  CREATININE 1.82* 1.66*  CALCIUM 8.4 7.8*   PT/INR  Recent Labs  2013-05-17 1155  LABPROT 14.3  INR 1.13   ABG No results found for this basename: PHART, PCO2, PO2, HCO3,  in the last 72 hours  Studies/Results: Ir Veno/ext/bi  17-May-2013   *RADIOLOGY REPORT*  PRIOR ULTRASOUND GUIDED VASCULAR ACCESS; IR INFERIOR VENA CAVA GRAM; IR THROMBOLYSIS/THROMBECTOMY BILATERAL LOWER EXTREMITIES; IR INITIATION OF VENOUS LYSIS INITIAL DAY  Date: 17-May-2013  Clinical History: 67 year old male with complicated medical history.  He had a TURP and left double-J ureteral stent placement procedure done in the end of August which was complicated by postoperative PE and subsequent postoperative hemorrhage following initiation of anticoagulation.  Hemostasis was obtained and IVC filter was placed.  The patient subsequently developed extensive bilateral lower extremity swelling and was found to have  extensive bilateral lower extremity and caval DVT extending into the filter.  He is now nearly 4 weeks post operative and showing in significant improvement with systemic heparinization.  He presents for attempted pharmacomechanical catheter directed thrombolysis/thrombectomy.  Procedures Performed: 1. Ultrasound-guided puncture of the left popliteal vein 2.  Catheterization of the inferior vena cava above the IVC filter with vena cava gram 3.  Power pulse spray infusion of 6 mg of TPA from the popliteal access to the infrarenal IVC 4.  Placement of a 90 cm total length 50 cm infusion length multi side-hole infusion catheter 5.  Ultrasound-guided puncture of the right popliteal vein 6.  Catheterization of the inferior vena cava above the IVC filter 7.  Power pulse spray infusion of 6 mg of TPA from the right popliteal access into the infrarenal IVC 8.  Placement of a 130 cm total length 50 cm infusion length multi side-hole infusion catheter 9.  Initiation of venous thrombolysis  Interventional Radiologist:  Sterling Big, MD  Sedation: Moderate (conscious) sedation was used.  Four mg Versed, 100 mcg Fentanyl were administered intravenously.  The patient's vital signs were monitored continuously by radiology nursing throughout the procedure.  Sedation Time: 60 minutes  Fluoroscopy time: 15 minutes 6 seconds  Contrast volume: 10 ml Omnipaque-300  Intravenous medications:  A total of 12 mg TPA was administered into the thrombus  PROCEDURE/FINDINGS:   Informed consent was obtained from the patient following explanation of the procedure, risks, benefits and alternatives. The patient understands, agrees  and consents for the procedure. All questions were addressed. A time out was performed.  Maximal barrier sterile technique utilized including caps, mask, sterile gowns, sterile gloves, large sterile drape, hand hygiene, and betadine skin prep.  The left popliteal fossa was interrogated with ultrasound.  The  popliteal vein is expanded and completely thrombosed.  Local anesthesia was attained by infiltration with 1% lidocaine.  Under real time sonographic guidance, the thrombosed vein was punctured with a 21-gauge micropuncture needle.  Images obtained stored for the medical record.  The transitional micro sheath was upsized to a 6-French working vascular sheath over a short Amplatz wire.  Using a stiff Glidewire and angled catheter, the catheter was used to navigate into the infrarenal IVC just beyond the nose cone of the IVC filter.  An inferior vena cava gram was performed limiting the amount of intravenous contrast used.  The left renal vein and inferior vena cava are patent.  Inflow from the right renal vein is also noted indicating patency.  The wire was advanced into the suprarenal IVC.  The 6-French 90 cm length AngioJet device was then used to power pulse spray a total of 6 mg of TPA and 125 ml of saline from the popliteal vein throughout the left lower extremity and into the IVC filter.  A 90 cm total length, 50 cm infusion length Unifuse infusion catheter was then positioned over the wire so that the proximal side hole was just within the filter, and the more proximal side hole in the mid thigh.  The sheath and catheter were secured to the skin with post silk suture.  The right popliteal fossa was interrogated with ultrasound.  Again, the popliteal vein was found be expanded completely thrombosed. Local anesthesia was attained by infiltration with 1% lidocaine. Under real time sonographic guidance, the vein was punctured using a 21-gauge micropuncture needle.  Images obtained stored for the medical record.  A 6-French working vascular sheath was then placed over a stiff Amplatz wire after transitioning from the transitional micro sheath.  The wire was advanced into the suprarenal IVC.  Care was taken to navigate the wire through the right aspect of the IVC and the filter to maximize lytic dispersal.  The  6-French AngioJet was then used to power pulse spray a total of 6 mg of TPA and 125 ml of saline from the popliteal artery into the IVC.  A 130 cm total length 50 cm infusion length Unifuse multi side-hole infusion catheter was then placed in identical position to the contralateral side.  Initial venous lysis was initiated at 0.25 mg connector place through each catheter for a total of 0.5 mg per hour.  Overall, the patient tolerated the procedure very well.  There was no immediate complication.  IMPRESSION:  1.  Inferior venacavagram demonstrates patency of the renal veins and IVC above the level of the filter.  2. Initial limited bilateral lower extremity pharmacomechanical thrombolysis.  3.  Initiation of bilateral lower extremity venous lysis using bilateral multi side-hole infusion catheters.  The patient will lyse for 12-24 hours and return interventional radiology for venogram and potential adjunctive interventions tomorrow.  Signed,  Sterling Big, MD [facility]   Original Report Authenticated By: Malachy Moan, M.D.   Ir Caffie Damme Ivc  05/11/2013   *RADIOLOGY REPORT*  PRIOR ULTRASOUND GUIDED VASCULAR ACCESS; IR INFERIOR VENA CAVA GRAM; IR THROMBOLYSIS/THROMBECTOMY BILATERAL LOWER EXTREMITIES; IR INITIATION OF VENOUS LYSIS INITIAL DAY  Date: 05/11/2013  Clinical History:  67 year old male with complicated medical history.  He had a TURP and left double-J ureteral stent placement procedure done in the end of August which was complicated by postoperative PE and subsequent postoperative hemorrhage following initiation of anticoagulation.  Hemostasis was obtained and IVC filter was placed.  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  He is now nearly 4 weeks post operative and showing in significant  improvement with systemic heparinization.  He presents for attempted pharmacomechanical catheter directed thrombolysis/thrombectomy.  Procedures Performed: 1. Ultrasound-guided puncture of the left popliteal vein 2.  Catheterization of the inferior vena cava above the IVC filter with vena cava gram 3.  Power pulse spray infusion of 6 mg of TPA from the popliteal access to the infrarenal IVC 4.  Placement of a 90 cm total length 50 cm infusion length multi side-hole infusion catheter 5.  Ultrasound-guided puncture of the right popliteal vein 6.  Catheterization of the inferior vena cava above the IVC filter 7.  Power pulse spray infusion of 6 mg of TPA from the right popliteal access into the infrarenal IVC 8.  Placement of a 130 cm total length 50 cm infusion length multi side-hole infusion catheter 9.  Initiation of venous thrombolysis  Interventional Radiologist:  Sterling Big, MD  Sedation: Moderate (conscious) sedation was used.  Four mg Versed, 100 mcg Fentanyl were administered intravenously.  The patient's vital signs were monitored continuously by radiology nursing throughout the procedure.  Sedation Time: 60 minutes  Fluoroscopy time: 15 minutes 6 seconds  Contrast volume: 10 ml Omnipaque-300  Intravenous medications:  A total of 12 mg TPA was administered into the thrombus  PROCEDURE/FINDINGS:   Informed consent was obtained from the patient following explanation of the procedure, risks, benefits and alternatives. The patient understands, agrees and consents for the procedure. All questions were addressed. A time out was performed.  Maximal barrier sterile technique utilized including caps, mask, sterile gowns, sterile gloves, large sterile drape, hand hygiene, and betadine skin prep.  The left popliteal fossa was interrogated with ultrasound.  The popliteal vein is expanded and completely thrombosed.  Local anesthesia was attained by infiltration with 1% lidocaine.  Under real time sonographic  guidance, the thrombosed vein was punctured with a 21-gauge micropuncture needle.  Images obtained stored for the medical record.  The transitional micro sheath was upsized to a 6-French working vascular sheath over a short Amplatz wire.  Using a stiff Glidewire and angled catheter, the catheter was used to navigate into the infrarenal IVC just beyond the nose cone of the IVC filter.  An inferior vena cava gram was performed limiting the amount of intravenous contrast used.  The left renal vein and inferior vena cava are patent.  Inflow from the right renal vein is also noted indicating patency.  The wire was advanced into the suprarenal IVC.  The 6-French 90 cm length AngioJet device was then used to power pulse spray a total of 6 mg of TPA and 125 ml of saline from the popliteal vein throughout the left lower extremity and into the IVC filter.  A 90 cm total length, 50 cm infusion length Unifuse infusion catheter was then positioned over the wire so that the proximal side hole was just within the filter, and the more proximal side hole in the mid thigh.  The sheath and catheter were secured to the skin with post silk suture.  The right popliteal fossa was interrogated with ultrasound.  Again, the popliteal vein was found be expanded completely thrombosed. Local anesthesia was attained by infiltration with 1% lidocaine. Under real time sonographic guidance, the vein was punctured using a 21-gauge micropuncture needle.  Images obtained stored for the medical record.  A 6-French working vascular sheath was then placed over a stiff Amplatz wire after transitioning from the transitional micro sheath.  The wire was advanced into the suprarenal IVC.  Care was taken to navigate the wire through the right aspect of the IVC and the filter to maximize lytic dispersal.  The 6-French AngioJet was then used to power pulse spray a total of 6 mg of TPA and 125 ml of saline from the popliteal artery into the IVC.  A 130 cm total  length 50 cm infusion length Unifuse multi side-hole infusion catheter was then placed in identical position to the contralateral side.  Initial venous lysis was initiated at 0.25 mg connector place through each catheter for a total of 0.5 mg per hour.  Overall, the patient tolerated the procedure very well.  There was no immediate complication.  IMPRESSION:  1.  Inferior venacavagram demonstrates patency of the renal veins and IVC above the level of the filter.  2. Initial limited bilateral lower extremity pharmacomechanical thrombolysis.  3.  Initiation of bilateral lower extremity venous lysis using bilateral multi side-hole infusion catheters.  The patient will lyse for 12-24 hours and return interventional radiology for venogram and potential adjunctive interventions tomorrow.  Signed,  Sterling Big, MD [facility]   Original Report Authenticated By: Malachy Moan, M.D.   Ir Thrombect Veno Mech Mod Sed  05/11/2013   *RADIOLOGY REPORT*  PRIOR ULTRASOUND GUIDED VASCULAR ACCESS; IR INFERIOR VENA CAVA GRAM; IR THROMBOLYSIS/THROMBECTOMY BILATERAL LOWER EXTREMITIES; IR INITIATION OF VENOUS LYSIS INITIAL DAY  Date: 05/11/2013  Clinical History: 67 year old male with complicated medical history.  He had a TURP and left double-J ureteral stent placement procedure done in the end of August which was complicated by postoperative PE and subsequent postoperative hemorrhage following initiation of anticoagulation.  Hemostasis was obtained and IVC filter was placed.  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  He is now nearly 4 weeks post operative and showing in significant improvement with systemic heparinization.  He presents for attempted pharmacomechanical catheter directed thrombolysis/thrombectomy.  Procedures  Performed: 1. Ultrasound-guided puncture of the left popliteal vein 2.  Catheterization of the inferior vena cava above the IVC filter with vena cava gram 3.  Power pulse spray infusion of 6 mg of TPA from the popliteal access to the infrarenal IVC 4.  Placement of a 90 cm total length 50 cm infusion length multi side-hole infusion catheter 5.  Ultrasound-guided puncture of the right popliteal vein 6.  Catheterization of the inferior vena cava above the IVC filter 7.  Power pulse spray infusion of 6 mg of TPA from the right popliteal access into the infrarenal IVC 8.  Placement of a 130 cm total length 50 cm infusion length multi side-hole infusion catheter 9.  Initiation of venous thrombolysis  Interventional Radiologist:  Sterling Big, MD  Sedation: Moderate (conscious) sedation was used.  Four mg Versed, 100 mcg Fentanyl were administered intravenously.  The patient's vital signs were monitored continuously by radiology nursing throughout the procedure.  Sedation Time: 60 minutes  Fluoroscopy time:  15 minutes 6 seconds  Contrast volume: 10 ml Omnipaque-300  Intravenous medications:  A total of 12 mg TPA was administered into the thrombus  PROCEDURE/FINDINGS:   Informed consent was obtained from the patient following explanation of the procedure, risks, benefits and alternatives. The patient understands, agrees and consents for the procedure. All questions were addressed. A time out was performed.  Maximal barrier sterile technique utilized including caps, mask, sterile gowns, sterile gloves, large sterile drape, hand hygiene, and betadine skin prep.  The left popliteal fossa was interrogated with ultrasound.  The popliteal vein is expanded and completely thrombosed.  Local anesthesia was attained by infiltration with 1% lidocaine.  Under real time sonographic guidance, the thrombosed vein was punctured with a 21-gauge micropuncture needle.  Images obtained stored for the medical record.  The transitional  micro sheath was upsized to a 6-French working vascular sheath over a short Amplatz wire.  Using a stiff Glidewire and angled catheter, the catheter was used to navigate into the infrarenal IVC just beyond the nose cone of the IVC filter.  An inferior vena cava gram was performed limiting the amount of intravenous contrast used.  The left renal vein and inferior vena cava are patent.  Inflow from the right renal vein is also noted indicating patency.  The wire was advanced into the suprarenal IVC.  The 6-French 90 cm length AngioJet device was then used to power pulse spray a total of 6 mg of TPA and 125 ml of saline from the popliteal vein throughout the left lower extremity and into the IVC filter.  A 90 cm total length, 50 cm infusion length Unifuse infusion catheter was then positioned over the wire so that the proximal side hole was just within the filter, and the more proximal side hole in the mid thigh.  The sheath and catheter were secured to the skin with post silk suture.  The right popliteal fossa was interrogated with ultrasound.  Again, the popliteal vein was found be expanded completely thrombosed. Local anesthesia was attained by infiltration with 1% lidocaine. Under real time sonographic guidance, the vein was punctured using a 21-gauge micropuncture needle.  Images obtained stored for the medical record.  A 6-French working vascular sheath was then placed over a stiff Amplatz wire after transitioning from the transitional micro sheath.  The wire was advanced into the suprarenal IVC.  Care was taken to navigate the wire through the right aspect of the IVC and the filter to maximize lytic dispersal.  The 6-French AngioJet was then used to power pulse spray a total of 6 mg of TPA and 125 ml of saline from the popliteal artery into the IVC.  A 130 cm total length 50 cm infusion length Unifuse multi side-hole infusion catheter was then placed in identical position to the contralateral side.  Initial  venous lysis was initiated at 0.25 mg connector place through each catheter for a total of 0.5 mg per hour.  Overall, the patient tolerated the procedure very well.  There was no immediate complication.  IMPRESSION:  1.  Inferior venacavagram demonstrates patency of the renal veins and IVC above the level of the filter.  2. Initial limited bilateral lower extremity pharmacomechanical thrombolysis.  3.  Initiation of bilateral lower extremity venous lysis using bilateral multi side-hole infusion catheters.  The patient will lyse for 12-24 hours and return interventional radiology for venogram and potential adjunctive interventions tomorrow.  Signed,  Sterling Big, MD [facility] Radiology  Original Report Authenticated By: Malachy Moan, M.D.   Ir Thrombect Veno Mech Mod Sed  05/11/2013   *RADIOLOGY REPORT*  PRIOR ULTRASOUND GUIDED VASCULAR ACCESS; IR INFERIOR VENA CAVA GRAM; IR THROMBOLYSIS/THROMBECTOMY BILATERAL LOWER EXTREMITIES; IR INITIATION OF VENOUS LYSIS INITIAL DAY  Date: 05/11/2013  Clinical History: 67 year old male with complicated medical history.  He had a TURP and left double-J ureteral stent placement procedure done in the end of August which was complicated by postoperative PE and subsequent postoperative hemorrhage following initiation of anticoagulation.  Hemostasis was obtained and IVC filter was placed.  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  He is now nearly 4 weeks post operative and showing in significant improvement with systemic heparinization.  He presents for attempted pharmacomechanical catheter directed thrombolysis/thrombectomy.  Procedures Performed: 1. Ultrasound-guided puncture of the left popliteal vein 2.  Catheterization of the inferior vena cava above the IVC filter with vena cava  gram 3.  Power pulse spray infusion of 6 mg of TPA from the popliteal access to the infrarenal IVC 4.  Placement of a 90 cm total length 50 cm infusion length multi side-hole infusion catheter 5.  Ultrasound-guided puncture of the right popliteal vein 6.  Catheterization of the inferior vena cava above the IVC filter 7.  Power pulse spray infusion of 6 mg of TPA from the right popliteal access into the infrarenal IVC 8.  Placement of a 130 cm total length 50 cm infusion length multi side-hole infusion catheter 9.  Initiation of venous thrombolysis  Interventional Radiologist:  Sterling Big, MD  Sedation: Moderate (conscious) sedation was used.  Four mg Versed, 100 mcg Fentanyl were administered intravenously.  The patient's vital signs were monitored continuously by radiology nursing throughout the procedure.  Sedation Time: 60 minutes  Fluoroscopy time: 15 minutes 6 seconds  Contrast volume: 10 ml Omnipaque-300  Intravenous medications:  A total of 12 mg TPA was administered into the thrombus  PROCEDURE/FINDINGS:   Informed consent was obtained from the patient following explanation of the procedure, risks, benefits and alternatives. The patient understands, agrees and consents for the procedure. All questions were addressed. A time out was performed.  Maximal barrier sterile technique utilized including caps, mask, sterile gowns, sterile gloves, large sterile drape, hand hygiene, and betadine skin prep.  The left popliteal fossa was interrogated with ultrasound.  The popliteal vein is expanded and completely thrombosed.  Local anesthesia was attained by infiltration with 1% lidocaine.  Under real time sonographic guidance, the thrombosed vein was punctured with a 21-gauge micropuncture needle.  Images obtained stored for the medical record.  The transitional micro sheath was upsized to a 6-French working vascular sheath over a short Amplatz wire.  Using a stiff Glidewire and angled catheter, the catheter  was used to navigate into the infrarenal IVC just beyond the nose cone of the IVC filter.  An inferior vena cava gram was performed limiting the amount of intravenous contrast used.  The left renal vein and inferior vena cava are patent.  Inflow from the right renal vein is also noted indicating patency.  The wire was advanced into the suprarenal IVC.  The 6-French 90 cm length AngioJet device was then used to power pulse spray a total of 6 mg of TPA and 125 ml of saline from the popliteal vein throughout the left lower extremity and into the IVC filter.  A 90 cm total  length, 50 cm infusion length Unifuse infusion catheter was then positioned over the wire so that the proximal side hole was just within the filter, and the more proximal side hole in the mid thigh.  The sheath and catheter were secured to the skin with post silk suture.  The right popliteal fossa was interrogated with ultrasound.  Again, the popliteal vein was found be expanded completely thrombosed. Local anesthesia was attained by infiltration with 1% lidocaine. Under real time sonographic guidance, the vein was punctured using a 21-gauge micropuncture needle.  Images obtained stored for the medical record.  A 6-French working vascular sheath was then placed over a stiff Amplatz wire after transitioning from the transitional micro sheath.  The wire was advanced into the suprarenal IVC.  Care was taken to navigate the wire through the right aspect of the IVC and the filter to maximize lytic dispersal.  The 6-French AngioJet was then used to power pulse spray a total of 6 mg of TPA and 125 ml of saline from the popliteal artery into the IVC.  A 130 cm total length 50 cm infusion length Unifuse multi side-hole infusion catheter was then placed in identical position to the contralateral side.  Initial venous lysis was initiated at 0.25 mg connector place through each catheter for a total of 0.5 mg per hour.  Overall, the patient tolerated the procedure  very well.  There was no immediate complication.  IMPRESSION:  1.  Inferior venacavagram demonstrates patency of the renal veins and IVC above the level of the filter.  2. Initial limited bilateral lower extremity pharmacomechanical thrombolysis.  3.  Initiation of bilateral lower extremity venous lysis using bilateral multi side-hole infusion catheters.  The patient will lyse for 12-24 hours and return interventional radiology for venogram and potential adjunctive interventions tomorrow.  Signed,  Sterling Big, MD [facility] Radiology   Original Report Authenticated By: Malachy Moan, M.D.   Ir US Guide Vasc Access Left  05/11/2013   *RADIOLOGY REPORT*  PRIOR ULTRASOUND GUIDED VASCULAR ACCESS; IR INFERIOR VENA CAVA GRAM; IR THROMBOLYSIS/THROMBECTOMY BILATERAL LOWER EXTREMITIES; IR INITIATION OF VENOUS LYSIS INITIAL DAY  Date: 05/11/2013  Clinical History: 67 year old male with complicated medical history.  He had a TURP and left double-J ureteral stent placement procedure done in the end of August which was complicated by postoperative PE and subsequent postoperative hemorrhage following initiation of anticoagulation.  Hemostasis was obtained and IVC filter was placed.  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  He is now nearly 4 weeks post operative and showing in significant improvement with systemic heparinization.  He presents for attempted pharmacomechanical catheter directed thrombolysis/thrombectomy.  Procedures Performed: 1. Ultrasound-guided puncture of the left popliteal vein 2.  Catheterization of the inferior vena cava above the IVC filter with vena cava gram 3.  Power pulse spray infusion of 6 mg of TPA from the popliteal access to the infrarenal IVC 4.  Placement of a 90 cm total length 50 cm infusion  length multi side-hole infusion catheter 5.  Ultrasound-guided puncture of the right popliteal vein 6.  Catheterization of the inferior vena cava above the IVC filter 7.  Power pulse spray infusion of 6 mg of TPA from the right popliteal access into the infrarenal IVC 8.  Placement of a 130 cm total length 50 cm infusion length multi side-hole infusion catheter 9.  Initiation  of venous thrombolysis  Interventional Radiologist:  Sterling Big, MD  Sedation: Moderate (conscious) sedation was used.  Four mg Versed, 100 mcg Fentanyl were administered intravenously.  The patient's vital signs were monitored continuously by radiology nursing throughout the procedure.  Sedation Time: 60 minutes  Fluoroscopy time: 15 minutes 6 seconds  Contrast volume: 10 ml Omnipaque-300  Intravenous medications:  A total of 12 mg TPA was administered into the thrombus  PROCEDURE/FINDINGS:   Informed consent was obtained from the patient following explanation of the procedure, risks, benefits and alternatives. The patient understands, agrees and consents for the procedure. All questions were addressed. A time out was performed.  Maximal barrier sterile technique utilized including caps, mask, sterile gowns, sterile gloves, large sterile drape, hand hygiene, and betadine skin prep.  The left popliteal fossa was interrogated with ultrasound.  The popliteal vein is expanded and completely thrombosed.  Local anesthesia was attained by infiltration with 1% lidocaine.  Under real time sonographic guidance, the thrombosed vein was punctured with a 21-gauge micropuncture needle.  Images obtained stored for the medical record.  The transitional micro sheath was upsized to a 6-French working vascular sheath over a short Amplatz wire.  Using a stiff Glidewire and angled catheter, the catheter was used to navigate into the infrarenal IVC just beyond the nose cone of the IVC filter.  An inferior vena cava gram was performed limiting the amount of  intravenous contrast used.  The left renal vein and inferior vena cava are patent.  Inflow from the right renal vein is also noted indicating patency.  The wire was advanced into the suprarenal IVC.  The 6-French 90 cm length AngioJet device was then used to power pulse spray a total of 6 mg of TPA and 125 ml of saline from the popliteal vein throughout the left lower extremity and into the IVC filter.  A 90 cm total length, 50 cm infusion length Unifuse infusion catheter was then positioned over the wire so that the proximal side hole was just within the filter, and the more proximal side hole in the mid thigh.  The sheath and catheter were secured to the skin with post silk suture.  The right popliteal fossa was interrogated with ultrasound.  Again, the popliteal vein was found be expanded completely thrombosed. Local anesthesia was attained by infiltration with 1% lidocaine. Under real time sonographic guidance, the vein was punctured using a 21-gauge micropuncture needle.  Images obtained stored for the medical record.  A 6-French working vascular sheath was then placed over a stiff Amplatz wire after transitioning from the transitional micro sheath.  The wire was advanced into the suprarenal IVC.  Care was taken to navigate the wire through the right aspect of the IVC and the filter to maximize lytic dispersal.  The 6-French AngioJet was then used to power pulse spray a total of 6 mg of TPA and 125 ml of saline from the popliteal artery into the IVC.  A 130 cm total length 50 cm infusion length Unifuse multi side-hole infusion catheter was then placed in identical position to the contralateral side.  Initial venous lysis was initiated at 0.25 mg connector place through each catheter for a total of 0.5 mg per hour.  Overall, the patient tolerated the procedure very well.  There was no immediate complication.  IMPRESSION:  1.  Inferior venacavagram demonstrates patency of the renal veins and IVC above the level  of the filter.  2. Initial limited bilateral lower extremity pharmacomechanical  thrombolysis.  3.  Initiation of bilateral lower extremity venous lysis using bilateral multi side-hole infusion catheters.  The patient will lyse for 12-24 hours and return interventional radiology for venogram and potential adjunctive interventions tomorrow.  Signed,  Sterling Big, MD [facility]iology   Original Report Authenticated By: Malachy Moan, M.D.   Ir US Guide Vasc Access Right  05/11/2013   *RADIOLOGY REPORT*  PRIOR ULTRASOUND GUIDED VASCULAR ACCESS; IR INFERIOR VENA CAVA GRAM; IR THROMBOLYSIS/THROMBECTOMY BILATERAL LOWER EXTREMITIES; IR INITIATION OF VENOUS LYSIS INITIAL DAY  Date: 05/11/2013  Clinical History: 67 year old male with complicated medical history.  He had a TURP and left double-J ureteral stent placement procedure done in the end of August which was complicated by postoperative PE and subsequent postoperative hemorrhage following initiation of anticoagulation.  Hemostasis was obtained and IVC filter was placed.  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  He is now nearly 4 weeks post operative and showing in significant improvement with systemic heparinization.  He presents for attempted pharmacomechanical catheter directed thrombolysis/thrombectomy.  Procedures Performed: 1. Ultrasound-guided puncture of the left popliteal vein 2.  Catheterization of the inferior vena cava above the IVC filter with vena cava gram 3.  Power pulse spray infusion of 6 mg of TPA from the popliteal access to the infrarenal IVC 4.  Placement of a 90 cm total length 50 cm infusion length multi side-hole infusion catheter 5.  Ultrasound-guided puncture of the right popliteal vein 6.  Catheterization of the inferior vena cava above the  IVC filter 7.  Power pulse spray infusion of 6 mg of TPA from the right popliteal access into the infrarenal IVC 8.  Placement of a 130 cm total length 50 cm infusion length multi side-hole infusion catheter 9.  Initiation of venous thrombolysis  Interventional Radiologist:  Sterling Big, MD  Sedation: Moderate (conscious) sedation was used.  Four mg Versed, 100 mcg Fentanyl were administered intravenously.  The patient's vital signs were monitored continuously by radiology nursing throughout the procedure.  Sedation Time: 60 minutes  Fluoroscopy time: 15 minutes 6 seconds  Contrast volume: 10 ml Omnipaque-300  Intravenous medications:  A total of 12 mg TPA was administered into the thrombus  PROCEDURE/FINDINGS:   Informed consent was obtained from the patient following explanation of the procedure, risks, benefits and alternatives. The patient understands, agrees and consents for the procedure. All questions were addressed. A time out was performed.  Maximal barrier sterile technique utilized including caps, mask, sterile gowns, sterile gloves, large sterile drape, hand hygiene, and betadine skin prep.  The left popliteal fossa was interrogated with ultrasound.  The popliteal vein is expanded and completely thrombosed.  Local anesthesia was attained by infiltration with 1% lidocaine.  Under real time sonographic guidance, the thrombosed vein was punctured with a 21-gauge micropuncture needle.  Images obtained stored for the medical record.  The transitional micro sheath was upsized to a 6-French working vascular sheath over a short Amplatz wire.  Using a stiff Glidewire and angled catheter, the catheter was used to navigate into the infrarenal IVC just beyond the nose cone of the IVC filter.  An inferior vena cava gram was performed limiting the amount of intravenous contrast used.  The left renal vein and inferior vena cava are patent.  Inflow from the right renal vein is also noted indicating patency.  The  wire  was advanced into the suprarenal IVC.  The 6-French 90 cm length AngioJet device was then used to power pulse spray a total of 6 mg of TPA and 125 ml of saline from the popliteal vein throughout the left lower extremity and into the IVC filter.  A 90 cm total length, 50 cm infusion length Unifuse infusion catheter was then positioned over the wire so that the proximal side hole was just within the filter, and the more proximal side hole in the mid thigh.  The sheath and catheter were secured to the skin with post silk suture.  The right popliteal fossa was interrogated with ultrasound.  Again, the popliteal vein was found be expanded completely thrombosed. Local anesthesia was attained by infiltration with 1% lidocaine. Under real time sonographic guidance, the vein was punctured using a 21-gauge micropuncture needle.  Images obtained stored for the medical record.  A 6-French working vascular sheath was then placed over a stiff Amplatz wire after transitioning from the transitional micro sheath.  The wire was advanced into the suprarenal IVC.  Care was taken to navigate the wire through the right aspect of the IVC and the filter to maximize lytic dispersal.  The 6-French AngioJet was then used to power pulse spray a total of 6 mg of TPA and 125 ml of saline from the popliteal artery into the IVC.  A 130 cm total length 50 cm infusion length Unifuse multi side-hole infusion catheter was then placed in identical position to the contralateral side.  Initial venous lysis was initiated at 0.25 mg connector place through each catheter for a total of 0.5 mg per hour.  Overall, the patient tolerated the procedure very well.  There was no immediate complication.  IMPRESSION:  1.  Inferior venacavagram demonstrates patency of the renal veins and IVC above the level of the filter.  2. Initial limited bilateral lower extremity pharmacomechanical thrombolysis.  3.  Initiation of bilateral lower extremity venous lysis using  bilateral multi side-hole infusion catheters.  The patient will lyse for 12-24 hours and return interventional radiology for venogram and potential adjunctive interventions tomorrow.  Signed,  Sterling Big, MD [facility] Radiology   Original Report Authenticated By: Malachy Moan, M.D.   Dg Chest Port 1 View  05/13/2013   CLINICAL DATA:  Shortness of breath. Atelectasis.  EXAM: PORTABLE CHEST - 1 VIEW  COMPARISON:  04/29/2013  FINDINGS: Slight improvement in aeration and lung volumes. No confluent opacities currently. Heart is upper limits normal in size. No effusions. No acute bony abnormality.  IMPRESSION: No active disease.   Electronically Signed   By: Charlett Nose M.D.   On: 05/13/2013 05:27   Ir Infusion Thrombol Venous Initial (ms)  05/11/2013   *RADIOLOGY REPORT*  PRIOR ULTRASOUND GUIDED VASCULAR ACCESS; IR INFERIOR VENA CAVA GRAM; IR THROMBOLYSIS/THROMBECTOMY BILATERAL LOWER EXTREMITIES; IR INITIATION OF VENOUS LYSIS INITIAL DAY  Date: 05/11/2013  Clinical History: 67 year old male with complicated medical history.  He had a TURP and left double-J ureteral stent placement procedure done in the end of August which was complicated by postoperative PE and subsequent postoperative hemorrhage following initiation of anticoagulation.  Hemostasis was obtained and IVC filter was placed.  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  He is now nearly 4 weeks post operative and showing in significant improvement with systemic heparinization.  He presents for attempted pharmacomechanical  catheter directed thrombolysis/thrombectomy.  Procedures Performed: 1. Ultrasound-guided puncture of the left popliteal vein 2.  Catheterization of the inferior vena cava above the IVC filter with vena cava gram 3.  Power pulse spray  infusion of 6 mg of TPA from the popliteal access to the infrarenal IVC 4.  Placement of a 90 cm total length 50 cm infusion length multi side-hole infusion catheter 5.  Ultrasound-guided puncture of the right popliteal vein 6.  Catheterization of the inferior vena cava above the IVC filter 7.  Power pulse spray infusion of 6 mg of TPA from the right popliteal access into the infrarenal IVC 8.  Placement of a 130 cm total length 50 cm infusion length multi side-hole infusion catheter 9.  Initiation of venous thrombolysis  Interventional Radiologist:  Sterling Big, MD  Sedation: Moderate (conscious) sedation was used.  Four mg Versed, 100 mcg Fentanyl were administered intravenously.  The patient's vital signs were monitored continuously by radiology nursing throughout the procedure.  Sedation Time: 60 minutes  Fluoroscopy time: 15 minutes 6 seconds  Contrast volume: 10 ml Omnipaque-300  Intravenous medications:  A total of 12 mg TPA was administered into the thrombus  PROCEDURE/FINDINGS:   Informed consent was obtained from the patient following explanation of the procedure, risks, benefits and alternatives. The patient understands, agrees and consents for the procedure. All questions were addressed. A time out was performed.  Maximal barrier sterile technique utilized including caps, mask, sterile gowns, sterile gloves, large sterile drape, hand hygiene, and betadine skin prep.  The left popliteal fossa was interrogated with ultrasound.  The popliteal vein is expanded and completely thrombosed.  Local anesthesia was attained by infiltration with 1% lidocaine.  Under real time sonographic guidance, the thrombosed vein was punctured with a 21-gauge micropuncture needle.  Images obtained stored for the medical record.  The transitional micro sheath was upsized to a 6-French working vascular sheath over a short Amplatz wire.  Using a stiff Glidewire and angled catheter, the catheter was used to navigate into the  infrarenal IVC just beyond the nose cone of the IVC filter.  An inferior vena cava gram was performed limiting the amount of intravenous contrast used.  The left renal vein and inferior vena cava are patent.  Inflow from the right renal vein is also noted indicating patency.  The wire was advanced into the suprarenal IVC.  The 6-French 90 cm length AngioJet device was then used to power pulse spray a total of 6 mg of TPA and 125 ml of saline from the popliteal vein throughout the left lower extremity and into the IVC filter.  A 90 cm total length, 50 cm infusion length Unifuse infusion catheter was then positioned over the wire so that the proximal side hole was just within the filter, and the more proximal side hole in the mid thigh.  The sheath and catheter were secured to the skin with post silk suture.  The right popliteal fossa was interrogated with ultrasound.  Again, the popliteal vein was found be expanded completely thrombosed. Local anesthesia was attained by infiltration with 1% lidocaine. Under real time sonographic guidance, the vein was punctured using a 21-gauge micropuncture needle.  Images obtained stored for the medical record.  A 6-French working vascular sheath was then placed over a stiff Amplatz wire after transitioning from the transitional micro sheath.  The wire was advanced into the suprarenal IVC.  Care was taken to navigate the wire through the right aspect of the IVC and  the filter to maximize lytic dispersal.  The 6-French AngioJet was then used to power pulse spray a total of 6 mg of TPA and 125 ml of saline from the popliteal artery into the IVC.  A 130 cm total length 50 cm infusion length Unifuse multi side-hole infusion catheter was then placed in identical position to the contralateral side.  Initial venous lysis was initiated at 0.25 mg connector place through each catheter for a total of 0.5 mg per hour.  Overall, the patient tolerated the procedure very well.  There was no  immediate complication.  IMPRESSION:  1.  Inferior venacavagram demonstrates patency of the renal veins and IVC above the level of the filter.  2. Initial limited bilateral lower extremity pharmacomechanical thrombolysis.  3.  Initiation of bilateral lower extremity venous lysis using bilateral multi side-hole infusion catheters.  The patient will lyse for 12-24 hours and return interventional radiology for venogram and potential adjunctive interventions tomorrow.  Signed,  Sterling Big, MD [facility] Radiology   Original Report Authenticated By: Malachy Moan, M.D.   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 Status post initiation of catheter directed venous thrombolytic therapy via bilateral popliteal access yesterday.  EXAM: 1. Followup angiography of bilateral lower extremities and IVC on completion of thrombolytic therapy.  2.  Venous angioplasty of right external and common iliac veins  FLUOROSCOPY TIME:  FLUOROSCOPY TIME  3 min and 12 seconds.  PROCEDURE: In a prone position, both pre-existing popliteal venous sheaths and infusion catheters were prepped with Betadine. Venography was performed of both lower extremities with injection of bilateral popliteal sheathes as well as both indwelling infusion catheters to assess venous patency from the level of the popliteal veins bilaterally through the iliac veins and inferior vena cava, including at the level of the IVC filter.  Right common iliac and external iliac veins were dilated with a 10 mm x 4 cm Conquest balloon. Additional venography was performed at the level of the common femoral veins bilaterally via 5 French catheters. Upon completion of the procedure, IV heparin was turned off and manual compression was held at both popliteal venous access sites after sheath removal. Thrombolytic therapy was discontinued.   Complications: None  FINDINGS: Venography demonstrates dramatically improved patency of bilateral popliteal and femoral veins in both lower extremities with antegrade flow reestablished and no significant residual thrombus present. A minimal amount of adherent mural thrombus was present in the right femoral vein. Left-sided iliac veins showed good patency and flow with no significant narrowing identified.  Right-sided iliac veins showed less rapid antegrade flow with some mural thrombus and stenosis present at the level of the common and external iliac veins. Iliac venous patency improved significantly after 10 mm balloon angioplasty with excellent antegrade flow present.  Visualized segment of the inferior vena cava shows no significant residual thrombus with flow present, including at the level of the indwelling IVC filter. There may be a minimal amount of residual mural thrombus remaining in the lower IVC which does not appear to be impeding flow.  IMPRESSION: Dramatic improvement in patency of bilateral lower extremity deep veins, iliac veins and the IVC. There was some sluggish flow associated with an area of stenosis of the right external and common iliac veins. This segment was treated with 10 mm balloon angioplasty with improved result.  PREPERATION AND PROCEDURE MEDICATIONS: 4.0 mg IV Versed and  and 200 mcg IV fentanyl.  Sedation time: 45 min   Electronically Signed   By: Irish Lack   On: 05/12/2013 15:57    Anti-infectives: Anti-infectives   Start     Dose/Rate Route Frequency Ordered Stop   05/03/13 1600  piperacillin-tazobactam (ZOSYN) IVPB 3.375 g     3.375 g 12.5 mL/hr over 240 Minutes Intravenous 3 times per day 05/03/13 1222 05/04/13 0213   05/03/13 1000  vancomycin (VANCOCIN) 1,500 mg in sodium chloride 0.9 % 500 mL IVPB  Status:  Discontinued     1,500 mg 250 mL/hr over 120 Minutes Intravenous Every 48 hours 05/01/13 1105 05/01/13 1403   05/01/13 1000  vancomycin (VANCOCIN) 1,500  mg in sodium chloride 0.9 % 500 mL IVPB  Status:  Discontinued     1,500 mg 250 mL/hr over 120 Minutes Intravenous Every 24 hours 05/01/13 0105 05/01/13 1105   04/30/13 1600  piperacillin-tazobactam (ZOSYN) IVPB 2.25 g  Status:  Discontinued     2.25 g 100 mL/hr over 30 Minutes Intravenous Every 8 hours 04/30/13 1342 05/03/13 1222   04/30/13 1200  vancomycin (VANCOCIN) IVPB 1000 mg/200 mL premix  Status:  Discontinued     1,000 mg 200 mL/hr over 60 Minutes Intravenous Every 12 hours 04/30/13 0245 04/30/13 1326   04/30/13 0800  piperacillin-tazobactam (ZOSYN) IVPB 3.375 g  Status:  Discontinued     3.375 g 12.5 mL/hr over 240 Minutes Intravenous Every 8 hours 04/30/13 0245 04/30/13 1326   04/30/13 0300  vancomycin (VANCOCIN) 2,000 mg in sodium chloride 0.9 % 500 mL IVPB     2,000 mg 250 mL/hr over 120 Minutes Intravenous  Once 04/30/13 0245 04/30/13 0510   04/30/13 0300  piperacillin-tazobactam (ZOSYN) IVPB 3.375 g     3.375 g 100 mL/hr over 30 Minutes Intravenous  Once 04/30/13 0245 04/30/13 0340      Assessment/Plan: s/p * No surgery found *  IVC and B LE DVT Lysis 9/28- 9/29 with great success Doing well Call if need [place].   LOS: 13 days    Allyna Pittsley A 05/13/2013

## 2013-05-13 NOTE — Progress Notes (Signed)
PT Cancellation Note  Patient Details Name: Jack [name] MRN: [id number] DOB: Oct 26, 1945   Cancelled Treatment:    Attempted to see pt this am. Pt remains on bedrest. Spoke to RN about getting activity orders. Will check back as schedule allows.   Fabio Asa 05/13/2013, 12:59 PM

## 2013-05-13 NOTE — Progress Notes (Signed)
Attempted to see pt this am.  Pt remains on bedrest.  Spoke to RN about getting activity orders.  Will check back as schedule allows. Tory Emerald, [place] [phone]

## 2013-05-13 NOTE — Progress Notes (Addendum)
ANTICOAGULATION CONSULT NOTE - Follow Up Consult  Pharmacy Consult for heparin Indication: DVT  Labs:  Recent Labs  05/11/13 1155  05/12/13 0545 05/12/13 1159 05/12/13 2235 05/13/13 0355  HGB  --   --  8.1*  --   --  7.1*  HCT  --   --  24.5*  --   --  21.2*  PLT  --   --  210  --   --  191  APTT 78*  --   --   --   --   --   LABPROT 14.3  --   --   --   --   --   INR 1.13  --   --   --   --   --   HEPARINUNFRC  --   < > 0.40 0.35 0.33 0.34  CREATININE  --   --  1.82*  --   --  1.66*  < > = values in this interval not displayed.   Assessment/Plan:  67yo male therapeutic on heparin after resuming, tenecteplase now d/c'd.  Patient with significant drop in Hgb with known renal hematomas. Dr. Tyson Alias aware- continue heparin for now and ordering CT abd/pelvis to evaluate for expansion of renal hematomas and checking CBC q12h.   Plan: Continue current rate. F/up CT and CBC  Link Snuffer, PharmD, BCPS Clinical Pharmacist [phone] 05/13/2013,1:09 PM  Addendum: CT abd/pelvis- stable subcapsular L-renal hematoma. Repeat CBC stable at hgb 7, Plts 172.  Continue heparin.  Follow-up AM level.   Link Snuffer, PharmD, BCPS Clinical Pharmacist [phone] 05/13/2013, 05/13/2013

## 2013-05-13 NOTE — Progress Notes (Signed)
PULMONARY  / CRITICAL CARE MEDICINE  Name: Jack [name] MRN: [id number] DOB: 15-Aug-1945    ADMISSION DATE:  04/30/2013 CONSULTATION DATE:  05/13/2013  REFERRING MD :  Butler Denmark PRIMARY SERVICE: PCCM  CHIEF COMPLAINT:  No complaints   BRIEF PATIENT DESCRIPTION: 67 y.o. male with complicated medical history including a recent stay at [facility] for PE S/P TURP 8/29  who required subsequent bilateral renal stents for obstruction due to hemorrhagic complications of anticoagulation.Pt was seen at [facility]  9/2 for V/Q showing likely PE. PT had CT showing L renal hematoma.9/5 - transferred to [facility] to PCCM service - noted to have large clot in bladder w/ B hydronephrosis 9/5 - IVC filter placement. In cone he demonstrated bilateral large clot burden DVT in lower ext and IVC was clotted.  Decision was made to perform a catheter directed lysis of clot and patient was transferred to the ICU for monitoring and further care while lytics are on.  SIGNIFICANT EVENTS / STUDIES:  8/29 - TURP w/ L ureteral stent [facility]  9/02 - admit [facility] w/ V/Q "high probability" for PE >> anticoag >> severe hematuria + anemia + renal failure  9/3 - CT abdom reveals large L renal capsular hematoma  9/5 - transferred to [facility] to PCCM service - noted to have large clot in bladder w/ B hydronephrosis  9/5 - IVC filter placement  9/6 - B ureteral stents placed - open evacuation of clot from bladder- suprapubic tube placed  9/7- venous duplex lower extremity - NO DVT  9/21-venous duplex lower extremity- Bilateral: Positive for DVT in the common femoral, profunda, femoral, popliteal, and posterior tibial veins with superficial thrombus in the greater and lesser saphenous veins.  9/22 - IV heparin initiated for DVTs (anticoagulation had been on hold due to renal hematoma)  9/28-Bilateral popliteal venous acc with initiation of bilateral LE and IVC thrombolysis, popliteal sheaths 9/29- IR inferior venacavagram,  thrombolysis/thrombectomy bilateral lower extremities showing improved patency, IVC patent. Advised d/c lytic therapy, continue heparin per pharmacy  9/30 - consider transfer to telemetry  LINES / TUBES: PIV 9/28 IR placed bilateral sheaths for lytic drip Suprapubic catheter   CULTURES: 9/17 Blood>>>NTD 9/28 MRSA>> Neg   ANTIBIOTICS: None  SUBJECTIVE:  Resting comfortably and responds appropriately. Pt does not have any complaints today. Did drop hgb  VITAL SIGNS: Temp:  [97.6 F (36.4 C)-98.8 F (37.1 C)] 98.8 F (37.1 C) (09/30 0405) Pulse Rate:  [66-108] 85 (09/30 0500) Resp:  [10-28] 22 (09/30 0500) BP: (113-158)/(46-103) 126/55 mmHg (09/30 0500) SpO2:  [90 %-100 %] 96 % (09/30 0500) Weight:  [303 lb 12.7 oz (137.8 kg)] 303 lb 12.7 oz (137.8 kg) (09/30 0500)    INTAKE / OUTPUT: Intake/Output     09/29 0701 - 09/30 0700   P.O. 440   I.V. (mL/kg) 580 (4.2)   IV Piggyback 150   Total Intake(mL/kg) 1170 (8.5)   Urine (mL/kg/hr) 2085 (0.6)   Total Output 2085   Net -915       Stool Occurrence 3 x     PHYSICAL EXAMINATION: General:  Obese male, NAD, resting comfortably without pain Neuro:  Alert and interactive, MAEs HEENT:  [place]/AT, PERRL, EOMI Cardiovascular:  RRR, no m/r/g Lungs:  Bibasilar crackles, decreased sounds on left Abdomen:  Soft, NT, ND and +BS. GU: hematuria present coming from suprapubic catheter Extremities: Bilateral leg pitting edema, 2+, mild erythema on both legs  LABS:  CBC Recent Labs     05/12/13  0545  05/13/13  0355  WBC  8.3  7.1  HGB  8.1*  7.1*  HCT  24.5*  21.2*  PLT  210  191   Coag's Recent Labs     05/11/13  1155  APTT  78*  INR  1.13   BMET Recent Labs     05/12/13  0545  05/13/13  0355  NA  138  137  K  4.1  4.2  CL  105  107  CO2  24  22  BUN  38*  36*  CREATININE  1.82*  1.66*  GLUCOSE  100*  109*   Electrolytes Recent Labs     05/12/13  0545  05/13/13  0355  CALCIUM  8.4  7.8*  MG  1.8   --    PHOS  3.7   --    Glucose Recent Labs     05/11/13  1941  GLUCAP  103*    ASSESSMENT / PLAN:  PULMONARY A:  No respiratory concerns Hx PE  IVC filter placed- had f/u angioplasty with improvement in patency 9/29 P:   - Titrate O2. - IS per RT protocol. -Sit upright when able -allow neg balance  CARDIOVASCULAR A:  DVT/PE Marginal BP prior- on Midodrine Recent asymptomatic brady- preload reduction from clotted filter P:  - D/C midodrine  - Tele monitoring  RENAL A:   Renal failure with left sided hematoma - tPA Hematuria- resolving  P:   - Monitor BME -see heme. -neg balance on own, improved CRT  GASTROINTESTINAL A:  No active issues- may resume heart healthy diet P:  - Heart healthy diet after procedure - monitor for any clots in urine  HEMATOLOGIC A:   tPA given as a drip for DVT / clot filter- d/c lytic therapy and P: - H&H repeat in q12h schedule, may need Tx -d/c lytic therapy  -continue heparin  But may need to hold with drop hgb and imaging needed now - monitor heparin levels -consider CT dry now, re assess renal hematoma extension  INFECTIOUS A:   No active issues. P:   - Monitor WBC and fever curve.  ENDOCRINE A:   No active issues. P:   - Monitor.  NEUROLOGIC A:   No active issues. Pt resting comfortably with positive outlook on recovery P: Supportive care  Patient is improving and no longer needs lytic therapy. He is currently on heparin at therapeutic level. Patient able to be transferred. Cbc concerning, repeat and CT now  I have personally obtained a history, examined the patient, evaluated laboratory and imaging results, formulated the assessment and plan and placed orders.  Nathanial Rancher PA- student [facility]   Mcarthur Rossetti. Tyson Alias, MD, FACP Pgr: [phone] [facility]

## 2013-05-14 ENCOUNTER — Inpatient Hospital Stay (HOSPITAL_COMMUNITY): Payer: PRIVATE HEALTH INSURANCE | Admitting: Certified Registered Nurse Anesthetist

## 2013-05-14 ENCOUNTER — Encounter (HOSPITAL_COMMUNITY): Payer: Self-pay | Admitting: Certified Registered Nurse Anesthetist

## 2013-05-14 ENCOUNTER — Encounter (HOSPITAL_COMMUNITY)
Admission: AD | Disposition: A | Discharge status: Skilled Nursing Facility | Payer: Self-pay | Source: Other Acute Inpatient Hospital | Attending: Internal Medicine

## 2013-05-14 DIAGNOSIS — M79A29 Nontraumatic compartment syndrome of unspecified lower extremity: Secondary | ICD-10-CM

## 2013-05-14 DIAGNOSIS — M79609 Pain in unspecified limb: Secondary | ICD-10-CM

## 2013-05-14 HISTORY — PX: FASCIOTOMY: SHX132

## 2013-05-14 HISTORY — PX: APPLICATION OF WOUND VAC: SHX5189

## 2013-05-14 LAB — CBC
HCT: 19.6 % — ABNORMAL LOW (ref 39.0–52.0)
Hemoglobin: 6.4 g/dL — CL (ref 13.0–17.0)
Hemoglobin: 7.8 g/dL — ABNORMAL LOW (ref 13.0–17.0)
MCH: 28.7 pg (ref 26.0–34.0)
MCH: 29.3 pg (ref 26.0–34.0)
MCHC: 32.7 g/dL (ref 30.0–36.0)
Platelets: 169 10*3/uL (ref 150–400)
RBC: 2.23 MIL/uL — ABNORMAL LOW (ref 4.22–5.81)
RBC: 2.66 MIL/uL — ABNORMAL LOW (ref 4.22–5.81)
WBC: 6.4 10*3/uL (ref 4.0–10.5)

## 2013-05-14 LAB — PROTIME-INR: INR: 1.21 (ref 0.00–1.49)

## 2013-05-14 LAB — BASIC METABOLIC PANEL
Calcium: 7.5 mg/dL — ABNORMAL LOW (ref 8.4–10.5)
Chloride: 107 mEq/L (ref 96–112)
GFR calc non Af Amer: 52 mL/min — ABNORMAL LOW (ref >90)
Glucose, Bld: 117 mg/dL — ABNORMAL HIGH (ref 70–99)
Potassium: 4.3 mEq/L (ref 3.5–5.1)
Sodium: 138 mEq/L (ref 135–145)

## 2013-05-14 LAB — APTT: aPTT: 41 seconds — ABNORMAL HIGH (ref 24–37)

## 2013-05-14 LAB — CK: Total CK: 27 U/L (ref 7–232)

## 2013-05-14 LAB — HEPARIN LEVEL (UNFRACTIONATED): Heparin Unfractionated: 0.54 IU/mL (ref 0.30–0.70)

## 2013-05-14 LAB — PREPARE RBC (CROSSMATCH)

## 2013-05-14 SURGERY — FASCIOTOMY, UPPER EXTREMITY
Anesthesia: General | Site: Leg Lower | Laterality: Right | Wound class: Clean

## 2013-05-14 MED ORDER — CEFAZOLIN SODIUM-DEXTROSE 2-3 GM-% IV SOLR
2.0000 g | INTRAVENOUS | Status: AC
  Administered 2013-05-14: 2 g via INTRAVENOUS
  Filled 2013-05-14: qty 50

## 2013-05-14 MED ORDER — HYDROMORPHONE HCL PF 1 MG/ML IJ SOLN
INTRAMUSCULAR | Status: DC | PRN
  Administered 2013-05-14: 1 mg via INTRAVENOUS

## 2013-05-14 MED ORDER — PANTOPRAZOLE SODIUM 40 MG IV SOLR
40.0000 mg | INTRAVENOUS | Status: DC
  Administered 2013-05-14 – 2013-05-15 (×2): 40 mg via INTRAVENOUS
  Filled 2013-05-14 (×3): qty 40

## 2013-05-14 MED ORDER — PROMETHAZINE HCL 25 MG/ML IJ SOLN: 6.2500 mg | INTRAMUSCULAR | Status: DC | PRN

## 2013-05-14 MED ORDER — HYDROMORPHONE HCL PF 1 MG/ML IJ SOLN: 0.2500 mg | INTRAMUSCULAR | Status: DC | PRN

## 2013-05-14 MED ORDER — PROPOFOL 10 MG/ML IV BOLUS
INTRAVENOUS | Status: DC | PRN
  Administered 2013-05-14: 150 mg via INTRAVENOUS

## 2013-05-14 MED ORDER — HEPARIN (PORCINE) IN NACL 100-0.45 UNIT/ML-% IJ SOLN
2000.0000 [IU]/h | INTRAMUSCULAR | Status: DC
  Administered 2013-05-14 – 2013-05-16 (×4): 2000 [IU]/h via INTRAVENOUS
  Filled 2013-05-14 (×4): qty 250

## 2013-05-14 MED ORDER — OXYCODONE HCL 5 MG/5ML PO SOLN: 5.0000 mg | Freq: Once | ORAL | Status: DC | PRN

## 2013-05-14 MED ORDER — SUCCINYLCHOLINE CHLORIDE 20 MG/ML IJ SOLN
INTRAMUSCULAR | Status: DC | PRN
  Administered 2013-05-14: 100 mg via INTRAVENOUS

## 2013-05-14 MED ORDER — MIDAZOLAM HCL 5 MG/5ML IJ SOLN
INTRAMUSCULAR | Status: DC | PRN
  Administered 2013-05-14: 2 mg via INTRAVENOUS

## 2013-05-14 MED ORDER — ONDANSETRON HCL 4 MG/2ML IJ SOLN
INTRAMUSCULAR | Status: DC | PRN
  Administered 2013-05-14: 4 mg via INTRAVENOUS

## 2013-05-14 MED ORDER — 0.9 % SODIUM CHLORIDE (POUR BTL) OPTIME
TOPICAL | Status: DC | PRN
  Administered 2013-05-14: 1000 mL

## 2013-05-14 MED ORDER — SODIUM CHLORIDE 0.9 % IV SOLN
INTRAVENOUS | Status: DC | PRN
  Administered 2013-05-14: 11:00:00 via INTRAVENOUS

## 2013-05-14 MED ORDER — FENTANYL CITRATE 0.05 MG/ML IJ SOLN
INTRAMUSCULAR | Status: DC | PRN
  Administered 2013-05-14 (×2): 50 ug via INTRAVENOUS
  Administered 2013-05-14: 100 ug via INTRAVENOUS
  Administered 2013-05-14: 50 ug via INTRAVENOUS
  Administered 2013-05-14: 100 ug via INTRAVENOUS
  Administered 2013-05-14: 50 ug via INTRAVENOUS
  Administered 2013-05-14: 100 ug via INTRAVENOUS

## 2013-05-14 MED ORDER — OXYCODONE HCL 5 MG PO TABS: 5.0000 mg | ORAL_TABLET | Freq: Once | ORAL | Status: DC | PRN

## 2013-05-14 MED ORDER — LIDOCAINE HCL (CARDIAC) 20 MG/ML IV SOLN
INTRAVENOUS | Status: DC | PRN
  Administered 2013-05-14: 100 mg via INTRAVENOUS

## 2013-05-14 SURGICAL SUPPLY — 50 items
BAG ISOLATION DRAPE 18X18 (DRAPES) ×1 IMPLANT
BANDAGE ELASTIC 4 VELCRO ST LF (GAUZE/BANDAGES/DRESSINGS) IMPLANT
BANDAGE ELASTIC 6 VELCRO ST LF (GAUZE/BANDAGES/DRESSINGS) IMPLANT
BANDAGE GAUZE ELAST BULKY 4 IN (GAUZE/BANDAGES/DRESSINGS) IMPLANT
BENZOIN TINCTURE PRP APPL 2/3 (GAUZE/BANDAGES/DRESSINGS) ×2 IMPLANT
BOOT SUTURE AID YELLOW STND (SUTURE) ×2 IMPLANT
CANISTER SUCTION 2500CC (MISCELLANEOUS) ×2 IMPLANT
CANISTER WOUND CARE 500ML ATS (WOUND CARE) ×2 IMPLANT
CLIP TI MEDIUM 24 (CLIP) ×2 IMPLANT
CLIP TI MEDIUM 6 (CLIP) ×2 IMPLANT
CLIP TI WIDE RED SMALL 24 (CLIP) ×2 IMPLANT
CLIP TI WIDE RED SMALL 6 (CLIP) ×4 IMPLANT
CONNECTOR Y ATS VAC SYSTEM (MISCELLANEOUS) ×2 IMPLANT
COVER SURGICAL LIGHT HANDLE (MISCELLANEOUS) ×2 IMPLANT
DRAPE INCISE IOBAN 66X45 STRL (DRAPES) IMPLANT
DRAPE ISOLATION BAG 18X18 (DRAPES) ×1
DRSG VAC ATS LRG SENSATRAC (GAUZE/BANDAGES/DRESSINGS) ×2 IMPLANT
ELECT REM PT RETURN 9FT ADLT (ELECTROSURGICAL) ×2
ELECTRODE REM PT RTRN 9FT ADLT (ELECTROSURGICAL) ×1 IMPLANT
GLOVE BIO SURGEON STRL SZ7 (GLOVE) ×2 IMPLANT
GLOVE BIO SURGEON STRL SZ8 (GLOVE) ×2 IMPLANT
GLOVE BIOGEL PI IND STRL 6.5 (GLOVE) ×1 IMPLANT
GLOVE BIOGEL PI IND STRL 7.0 (GLOVE) ×1 IMPLANT
GLOVE BIOGEL PI IND STRL 7.5 (GLOVE) ×2 IMPLANT
GLOVE BIOGEL PI INDICATOR 6.5 (GLOVE) ×1
GLOVE BIOGEL PI INDICATOR 7.0 (GLOVE) ×1
GLOVE BIOGEL PI INDICATOR 7.5 (GLOVE) ×2
GLOVE ECLIPSE 6.5 STRL STRAW (GLOVE) ×2 IMPLANT
GLOVE SS BIOGEL STRL SZ 7 (GLOVE) ×1 IMPLANT
GLOVE SUPERSENSE BIOGEL SZ 7 (GLOVE) ×1
GOWN STRL NON-REIN LRG LVL3 (GOWN DISPOSABLE) ×6 IMPLANT
KIT BASIN OR (CUSTOM PROCEDURE TRAY) ×2 IMPLANT
KIT ROOM TURNOVER OR (KITS) ×2 IMPLANT
NS IRRIG 1000ML POUR BTL (IV SOLUTION) ×2 IMPLANT
PACK GENERAL/GYN (CUSTOM PROCEDURE TRAY) ×2 IMPLANT
PACK UNIVERSAL I (CUSTOM PROCEDURE TRAY) ×2 IMPLANT
PAD ARMBOARD 7.5X6 YLW CONV (MISCELLANEOUS) ×4 IMPLANT
PAD NEG PRESSURE SENSATRAC (MISCELLANEOUS) ×2 IMPLANT
SPONGE GAUZE 4X4 12PLY (GAUZE/BANDAGES/DRESSINGS) IMPLANT
SUT ETHILON 3 0 PS 1 (SUTURE) IMPLANT
SUT MNCRL AB 4-0 PS2 18 (SUTURE) IMPLANT
SUT PROLENE 5 0 C 1 24 (SUTURE) ×2 IMPLANT
SUT SILK 3 0 (SUTURE) ×1
SUT SILK 3-0 18XBRD TIE 12 (SUTURE) ×1 IMPLANT
SUT VIC AB 2-0 CTX 36 (SUTURE) IMPLANT
SUT VIC AB 3-0 SH 27 (SUTURE)
SUT VIC AB 3-0 SH 27X BRD (SUTURE) IMPLANT
TOWEL OR 17X24 6PK STRL BLUE (TOWEL DISPOSABLE) ×2 IMPLANT
TOWEL OR 17X26 10 PK STRL BLUE (TOWEL DISPOSABLE) ×4 IMPLANT
WATER STERILE IRR 1000ML POUR (IV SOLUTION) ×2 IMPLANT

## 2013-05-14 NOTE — Progress Notes (Signed)
ANTICOAGULATION CONSULT NOTE - Follow Up Consult  Pharmacy Consult for heparin Indication: DVT  Labs:  Recent Labs  05/12/13 0545  05/12/13 2235 05/13/13 0355 05/13/13 1440 05/14/13 0107 05/14/13 0301 05/14/13 1020  HGB 8.1*  --   --  7.1* 7.0* 6.4*  --   --   HCT 24.5*  --   --  21.2* 20.8* 19.6*  --   --   PLT 210  --   --  191 172 169  --   --   APTT  --   --   --   --   --   --   --  41*  LABPROT  --   --   --   --   --   --   --  15.0  INR  --   --   --   --   --   --   --  1.21  HEPARINUNFRC 0.40  < > 0.33 0.34  --   --  0.54  --   CREATININE 1.82*  --   --  1.66*  --  1.38*  --   --   < > = values in this interval not displayed.   Assessment/Plan:  67yo male therapeutic on heparin after resuming, tenecteplase now d/c'd.  Patient with significant drop in Hgb with known renal hematomas. CT abd/pelvis -stable. This am with swelling on RLE and hematoma >> went to OR for concern of compartment syndrome now s/p fasciotomy with negative pressure dressing placement. Orders received to resume heparin 8 hours post OR. Patient was transfused for hgb 6.4.   Plan: Restart heparin at 2000 units/hr at 2300 PM without a bolus. Heparin level & CBC in 6 hours.   Link Snuffer, PharmD, BCPS Clinical Pharmacist [phone] 05/14/2013,3:09 PM

## 2013-05-14 NOTE — Transfer of Care (Signed)
Immediate Anesthesia Transfer of Care Note  Patient: Jack [name]  Procedure(s) Performed: Procedure(s): FASCIOTOMY with wound vac placement (Right) APPLICATION OF WOUND VAC (Right)  Patient Location: PACU  Anesthesia Type:General  Level of Consciousness: awake, alert  and oriented  Airway & Oxygen Therapy: Patient Spontanous Breathing and Patient connected to nasal cannula oxygen  Post-op Assessment: Report given to PACU RN, Post -op Vital signs reviewed and stable and Patient moving all extremities  Post vital signs: Reviewed and stable  Complications: No apparent anesthesia complications

## 2013-05-14 NOTE — Progress Notes (Signed)
Interventional Radiology Progress Note  HPI: 67 yo male with complicated PMH including post operative PE and bleeding complication following anticoagulation necessitating IVC filter placement which was then complicated by ICV and BLE venous thrombosis.  He is now POD#2 s/p catheter directed thrombolysis.  Sudden Hg drop yesterday of uncertain etiology.  CT abd/pelvis showed no hemorrhage/hematoma.    Subjective: Complains of right calf pain, states "feels like someone kicked me in the leg".   Pain began yesterday afternoon and has remained a steady 5/10.  No significant increase in sx.  Pain increases when he moves his foot.  Also reports good urination yesterday for the first time . Otherwise, denies CP, SOB, abd pain.   Objective: Filed Vitals:   05/14/13 0736  BP:   Pulse:   Temp: 98.5 F (36.9 C)  Resp:    Ext:  Left leg soft, popliteal fossa dressing C/D/I.  Minimal bruising in pop fossa.  Still 2+ edema, but sig improved compared to pre-procedure.  Right calf firm with + TTP and pain on passive dorsiflexion.  Firm swelling in popliteal fossa inferior to bandage.  Large ecchymosis under transparent bandage.  Upper thigh is soft with improved swelling.  Pulses: DP palpable and symetric bilaterally  Assessment / Recommendations: Delayed bleed into right calf compartment, likely from the popliteal vein access site.   Given Hg drop, likely bleed 1-2 units into the space.  I am somewhat concerned about compartment syndrome given pain on passive dorsiflexion.  Dr. Tyson Alias alerted. - Ortho consult to evaluate for compartment syndrome - Stop heparin for now - SCD to LEFT leg  - Will follow with you  Signed,  Sterling Big, MD [facility] Radiology

## 2013-05-14 NOTE — Progress Notes (Signed)
PT Cancellation Note  Patient Details Name: Jack [name] MRN: [id number] DOB: July 28, 1946   Cancelled Treatment:    Reason Eval/Treat Not Completed: Medical issues which prohibited therapy (pt to OR will sign off and await new orders)   Fabio Asa 05/14/2013, 11:24 AM

## 2013-05-14 NOTE — Progress Notes (Signed)
OT Cancellation Note  Patient Details Name: Jack [name] MRN: [id number] DOB: 20-Jan-1946   Cancelled Treatment:    Reason Eval/Treat Not Completed: Medical issues which prohibited therapy. Pt remains on bedrest. Will check back later today as schedule allows.  05/14/2013 Cipriano Mile OTR/L Pager [phone] Office [phone]

## 2013-05-14 NOTE — Anesthesia Procedure Notes (Addendum)
Procedure Name: Intubation Date/Time: 05/14/2013 11:31 AM Performed by: Orvilla Fus A Pre-anesthesia Checklist: Patient identified, Emergency Drugs available, Suction available, Patient being monitored and Timeout performed Patient Re-evaluated:Patient Re-evaluated prior to inductionOxygen Delivery Method: Circle system utilized Preoxygenation: Pre-oxygenation with 100% oxygen Intubation Type: IV induction and Rapid sequence Laryngoscope Size: Mac and 4 Grade View: Grade I Tube type: Oral Tube size: 7.5 mm Number of attempts: 1 Airway Equipment and Method: Stylet Placement Confirmation: ETT inserted through vocal cords under direct vision,  positive ETCO2 and CO2 detector Secured at: 23 cm Tube secured with: Tape

## 2013-05-14 NOTE — Progress Notes (Signed)
[facility] [place]  Intraoperative findings are consistent with acute compartment syndrome due to popliteal fossa bleeding.  There was extensive muscle dissection evident with some concern for possible muscle injury due to poor superficial and deep compartment muscle contraction to electrocautery.  No obvious popliteal vessel bleeding was visualized.  - Would check CPK and myoglobin to see if any evidence of muscle injury - Check CBC in 2 hours as 2 u pRBC given intraoperatively - Obviously, patient at risk for bleeding but in this situation the risk of re-thrombosis are considerable so consider restarting anticoagulation in 8 hours. - VAC will continue through the next few days.  Pt will return to OR on Monday for re-evaluation and likely lateral incision closure.  I doubt the medial incision will close for weeks, possibly requiring skin graft placement.    Leonides Sake, MD [facility] [place] Office: [phone] Pager: [phone]  05/14/2013, 1:35 PM

## 2013-05-14 NOTE — Anesthesia Preprocedure Evaluation (Addendum)
Anesthesia Evaluation  Patient identified by MRN, date of birth, ID band Patient awake    Airway Mallampati: II  Neck ROM: Full    Dental  (+) Edentulous Upper and Edentulous Lower   Pulmonary shortness of breath, asthma ,  breath sounds clear to auscultation        Cardiovascular hypertension, + Peripheral Vascular Disease Rhythm:Regular Rate:Normal     Neuro/Psych    GI/Hepatic   Endo/Other    Renal/GU Renal disease     Musculoskeletal   Abdominal (+) + obese,   Peds  Hematology   Anesthesia Other Findings   Reproductive/Obstetrics                          Anesthesia Physical Anesthesia Plan  ASA: IV  Anesthesia Plan: General   Post-op Pain Management:    Induction: Intravenous  Airway Management Planned: Oral ETT  Additional Equipment:   Intra-op Plan:   Post-operative Plan: Extubation in OR  Informed Consent: I have reviewed the patients History and Physical, chart, labs and discussed the procedure including the risks, benefits and alternatives for the proposed anesthesia with the patient or authorized representative who has indicated his/her understanding and acceptance.   Dental advisory given  Plan Discussed with: CRNA and Surgeon  Anesthesia Plan Comments:         Anesthesia Quick Evaluation

## 2013-05-14 NOTE — Progress Notes (Signed)
CRITICAL VALUE ALERT  Critical value received:  Hgb - 6.4  Date of notification:  05/14/13   Time of notification:  0250  Critical value read back: YES  Nurse who received alert:  Elisha Headland RN   MD notified (1st page):  eLink MD Craige Cotta  I: none given at this time.   Elisha Headland RN

## 2013-05-14 NOTE — Op Note (Signed)
OPERATIVE NOTE   PROCEDURE: 1. Right calf four-compartment fasciotomy 2. Negative pressure dressing placement  PRE-OPERATIVE DIAGNOSIS: Right calf compartment syndrome  POST-OPERATIVE DIAGNOSIS: same as above   SURGEON: Brian Chen, MD  ASSISTANT(S): Maureen Collins, PAC   ANESTHESIA: general  ESTIMATED BLOOD LOSS: 100 cc  FINDING(S): 1. Pressurized hematoma in deep compartment with dissecting hematoma along deep and superficial posterior fascia 2. Evidence of compartment physiology as evident by resumption of arteriole bleeding after releasing deep posterior compartment 3. Extensive clot involvement of calf musculature 4. No anterior or lateral compartment involvement 5. Limited response to electrical stimulation of soleus and medial gastrocneumius 6. No frank active bleeding from popliteal vessels  SPECIMEN(S):  none  INDICATIONS:   Jack [name] is a 66 y.o. male with complex medical history s/p pharmacomechanical thrombectomy who presents with acute onset of right calf compartment syndrome.  I discussed the procedure with the patient. He is aware the risks for this case included: bleeding, nerve injury, inability to close the skin in the calves without additional procedures, non-resolution of preoperative symptoms, death, and stroke.  The patient agreed to proceed.  DESCRIPTION: After obtaining full informed written consent, the patient was brought back to the operating room and placed supine upon the operating table.  The patient received IV antibiotics prior to induction.  After obtaining adequate anesthesia, the patient was prepped and draped in the standard fashion for: right calf fasciotomy.  I made an incision medially one finger-width behind the tibia from level of tibial plateau down to level of ankle and dissected through the subcutaneous tissue which was extremely edematous.  Multiple veins were ligated or clipped in this process.  The greater saphenous vein in the calf  was ligated as it was repeatedly injured in this process.  The superficial fascia was already bulging.  I opened this layer along the entire length of the incision with electrocautery.  Some venous blood and clot were immediately obvious.  I then opened the deep compartment fascia with electrocautery, which decompressed a hematoma in the popliteal space which was under tension.  I evacuated ~50 cc of fresh clot from the popliteal space.  There was no active bleeding from the popliteal vessels.  I packed this wound with lap pads and turned my attention to the lateral calf.  I made an longitudinal incision between the tibia and fibula from the tibial plateau down to the level of proximal ankle.  I dissected through the subcutaneous tissue with electrocautery, ligating and clipping small veins in the process.  I open the fascia with electrocautery overlying the anterior compartment along the entire length of the leg.  I then then identified the intercompartmental fascia to find the lateral compartment.  I opened the the lateral fascia with electrocautery along the entire length of the leg.  Muscle throughout both compartments was viable without evidence of any involvement.    I turned my attention back to the medial incision.  After removing all lap pads, no further active bleeding was present.  The previous bleeding in the popliteal space had dissected out the medial gastrocneumius and soleus muscles.  Both muscles had limited response to electric stimulation.  I elected to place a VAC dressing on both incisions.   I fashion a large VAC sponge for the medial incision.  The sponge was affixed with adhesive strips.  i cut a hole in the adhesive strips and attached the lilypad.  The VAC sponge was connected to the suction circuit to get evacuation   from the VAC sponge and prevent clotting.  I turned my attention to the lateral incision, no active bleeding was present.  I fashion a large VAC sponge for the lateral  incision.  The sponge was affixed with adhesive strips.  i cut a hole in the adhesive strips and attached the lilypad.  The both lilypads were connected to a Y-connector which was connected to the VAC pump.  The pump was set to 125 mm Hg continuous.  No leak was visualized.  COMPLICATIONS: none  CONDITION: stable  Brian Chen, MD [facility] [place] Office: [phone] Pager: [phone]  05/14/2013, 1:11 PM   

## 2013-05-14 NOTE — Consult Note (Addendum)
[facility] [place]  Referred by:  Dr. Tyson Alias (PCCM)  Reason for referral: possible right calf compartment syndrome  History of Present Illness  Jack [name] is a 67 y.o. (1946/01/19) male who presents with chief complaint: pain in right calf.  This patient has a very complicated history of TURP complicated by PE.  On anticoagulation, the patient developed a perinephric hematoma and required IVC filter placement.  He later on developed B iliofemoral DVT requiring lysis.  On 05/11/13, the patient was brought back to the IR suite for Angiojet pharmacomechanical thrombectomy and lytic catheter placement.  His completion imaging on 05/12/13 was improved and sheath were removed sometime during that day.  On the subsequent day, there was no evidence of bleeding from the cannulation sites.  Overnight on 05/13/13, by report from ICU nursing staff, the patient developed increased swelling in the right calf and increased pain.  At this point, the patient complains of pain with mild movement of his calf and light touch.  He denies any numbness at this point.  He notes tenseness in right calf.  Dr. Tyson Alias and Dr. Archer Asa have concern with compartment syndrome.  Past Medical History  Diagnosis Date  . HTN (hypertension)   . Ureteral stone   . Colon cancer   . Shortness of breath   . Asthma   . Chronic kidney disease     acuts on chronic renal failure  . Arthritis   . PE (pulmonary embolism) 04/2013   Past Surgical History  Procedure Laterality Date  . Ureteral stent placement  04/10/2013  . Transurethral resection of prostate  04/10/2013  . Right colectomy  1996  . Tonsillectomy    . Cystoscopy with stent placement Bilateral 04/19/2013    Procedure: CYSTOSCOPY WITH STENT PLACEMENT;  Surgeon: Anner Crete, MD;  Location: [facility] OR;  Service: Urology;  Laterality: Bilateral;  . Cystoscopy N/A 04/19/2013    Procedure: CYSTOSCOPY WITH CLOT EVACUATION ;  Surgeon: Anner Crete, MD;   Location: [facility] OR;  Service: Urology;  Laterality: N/A;  . Laparotomy N/A 04/19/2013    Procedure: EXPLORATORY LAPAROTOMY;  Surgeon: Anner Crete, MD;  Location: [facility] OR;  Service: Urology;  Laterality: N/A;  Exploratory Laparotomy with evacuation of blood clot in bladder with placement of super pubic tube     History   Social History  . Marital Status: Married    Spouse Name: N/A    Number of Children: N/A  . Years of Education: N/A   Occupational History  . Not on file.   Social History Main Topics  . Smoking status: Never Smoker   . Smokeless tobacco: Current User    Types: Chew  . Alcohol Use: Yes     Comment: OCCASSIONAL  . Drug Use: No  . Sexual Activity: Not on file   Other Topics Concern  . Not on file   Social History Narrative  . No narrative on file   Family History  Problem Relation Age of Onset  . Urolithiasis    . Deep vein thrombosis Mother   . Heart disease Sister    No current facility-administered medications on file prior to encounter.   Current Outpatient Prescriptions on File Prior to Encounter  Medication Sig Dispense Refill  . ALPRAZolam (XANAX) 0.25 MG tablet Take 1 tablet (0.25 mg total) by mouth 3 (three) times daily as needed for anxiety.  21 tablet  0  . docusate sodium (COLACE) 100 MG capsule Take 100 mg  by mouth 3 (three) times daily.      Marland Kitchen oxyCODONE (OXY IR/ROXICODONE) 5 MG immediate release tablet Take 5 mg by mouth every 4 (four) hours as needed for pain.      . Testosterone (ANDROGEL PUMP) 20.25 MG/ACT (1.62%) GEL Place 1 Act onto the skin daily.        Allergies  Allergen Reactions  . Contrast Media [Iodinated Diagnostic Agents]     On 04/29/2013 spoke with patient he had some type of procedure at [facility] and broke out in hives after the exams in 1997, he did not know what of type of study he had.  I called [facility] Radiology department the only type of contrast they used during that time  was [place]ray   REVIEW OF  SYSTEMS:  (Positives checked otherwise negative)  CARDIOVASCULAR:  []  chest pain, []  chest pressure, []  palpitations, []  shortness of breath when laying flat, []  shortness of breath with exertion,  []  pain in feet when walking, []  pain in feet when laying flat, [x]  history of blood clot in veins (DVT), []  history of phlebitis, [x]  swelling in legs, []  varicose veins  PULMONARY:  []  productive cough, []  asthma, []  wheezing  NEUROLOGIC:  []  weakness in arms or legs, []  numbness in arms or legs, []  difficulty speaking or slurred speech, []  temporary loss of vision in one eye, []  dizziness  HEMATOLOGIC:  []  bleeding problems, [x]  problems with blood clotting too easily  MUSCULOSKEL:  []  joint pain, []  joint swelling  GASTROINTEST:  []  vomiting blood, []  blood in stool     GENITOURINARY:  []  burning with urination, [x]  blood in urine, [x]  perinephric hematoma  PSYCHIATRIC:  []  history of major depression  INTEGUMENTARY:  []  rashes, []  ulcers  CONSTITUTIONAL:  []  fever, []  chills  Physical Examination  Filed Vitals:   05/14/13 0500 05/14/13 0600 05/14/13 0700 05/14/13 0736  BP: 136/62 138/64 123/69   Pulse: 82 85 93   Temp:    98.5 F (36.9 C)  TempSrc:    Oral  Resp: 20 21 15    Height:      Weight: 302 lb 14.6 oz (137.4 kg)     SpO2: 96% 97% 98%    Body mass index is 41.07 kg/(m^2).  General: A&O x 3, WD, Morbidly obese  Head: [place]/AT  Ear/Nose/Throat: Hearing grossly intact, nares w/o erythema or drainage, oropharynx w/o Erythema/Exudate, Mallampati score: 3  Eyes: PERRLA, EOMI  Neck: Supple, no nuchal rigidity, no palpable LAD  Pulmonary: Sym exp, good air movt, CTAB, no rales, rhonchi, & wheezing  Cardiac: RRR, Nl S1, S2, no Murmurs, rubs or gallops  Vascular: Vessel Right Left  Radial Palpable Palpable  Brachial Palpable Palpable  Carotid Palpable, without bruit Palpable, without bruit  Aorta Not palpable due to obesity N/A  Femoral Palpable Palpable  Popliteal  Not palpable Not palpable  PT Palpable Palpable  DP Palpable Palpable   Gastrointestinal: soft, NTND, -G/R, - HSM, - masses, - CVAT B, no AAA palpable due to pannus, suprapubic catheter  Musculoskeletal: M/S 5/5 throughout except R leg: greatly limited movement due to pain, Decreased PF/DF, Pain with PROM, Extremities without ischemic changes , R>L edema 1-2+,   Neurologic: CN 2-12 intact , Normal pain in L leg, R calf pain out of proportion to exam, sensation intact in both feet, Motor exam as listed above  Psychiatric: Judgment intact, Mood & affect appropriate for pt's clinical situation  Dermatologic: See M/S exam for extremity exam,  no rashes otherwise noted  Lymph : No Cervical, Axillary, or Inguinal lymphadenopathy   Laboratory: CBC:    Component Value Date/Time   WBC 5.3 05/14/2013 0107   RBC 2.23* 05/14/2013 0107   HGB 6.4* 05/14/2013 0107   HCT 19.6* 05/14/2013 0107   PLT 169 05/14/2013 0107   MCV 87.9 05/14/2013 0107   MCH 28.7 05/14/2013 0107   MCHC 32.7 05/14/2013 0107   RDW 16.4* 05/14/2013 0107   LYMPHSABS 1.0 05/02/2013 0325   MONOABS 1.5* 05/02/2013 0325   EOSABS 0.1 05/02/2013 0325   BASOSABS 0.0 05/02/2013 0325    BMP:    Component Value Date/Time   NA 138 05/14/2013 0107   K 4.3 05/14/2013 0107   CL 107 05/14/2013 0107   CO2 23 05/14/2013 0107   GLUCOSE 117* 05/14/2013 0107   BUN 31* 05/14/2013 0107   CREATININE 1.38* 05/14/2013 0107   CALCIUM 7.5* 05/14/2013 0107   GFRNONAA 52* 05/14/2013 0107   GFRAA 60* 05/14/2013 0107    Coagulation: Lab Results  Component Value Date   INR 1.13 05/11/2013   INR 1.28 05/05/2013   INR 1.38 04/27/2013   No results found for this basename: PTT    Radiology: Ct Abdomen Pelvis Wo Contrast  05/13/2013   *RADIOLOGY REPORT*  Clinical Data: reassess renal hematomas  CT ABDOMEN AND PELVIS WITHOUT CONTRAST  Technique:  Multidetector CT imaging of the abdomen and pelvis was performed following the standard protocol without intravenous  contrast.  Comparison: 05/10/13  Findings: Slight increase in small bilateral pleural effusions.  Liver, gallbladder, spleen, pancreas, and adrenal glands normal and unchanged, except for incidental punctate calcification right adrenal gland.  Bilateral ureteral stents unchanged.  16 mm thick  subcapsular hematoma left kidney stable.  Large left upper pole cyst stable. Small right exophytic mid pole cyst stable.  Suprapubic bladder catheter unchanged.  IVC filter stable.  Bowel remains unremarkable.  IMPRESSION: Stable subcapsular left renal hematoma.Small bilateral pleural effusions, mildly increased.   Original Report Authenticated By: Esperanza Heir, M.D.   Dg Chest Port 1 View  05/13/2013   CLINICAL DATA:  Shortness of breath. Atelectasis.  EXAM: PORTABLE CHEST - 1 VIEW  COMPARISON:  04/29/2013  FINDINGS: Slight improvement in aeration and lung volumes. No confluent opacities currently. Heart is upper limits normal in size. No effusions. No acute bony abnormality.  IMPRESSION: No active disease.   Electronically Signed   By: Charlett Nose M.D.   On: 05/13/2013 05:27   Ir Rande Lawman F/u Eval Art/ven Final Day (ms)  05/12/2013   CLINICAL DATA:  Bilateral lower extremity DVT and IVC thrombosis. Status post initiation of catheter directed venous thrombolytic therapy via bilateral popliteal access yesterday.  EXAM: 1. Followup angiography of bilateral lower extremities and IVC on completion of thrombolytic therapy.  2.  Venous angioplasty of right external and common iliac veins  FLUOROSCOPY TIME:  FLUOROSCOPY TIME  3 min and 12 seconds.  PROCEDURE: In a prone position, both pre-existing popliteal venous sheaths and infusion catheters were prepped with Betadine. Venography was performed of both lower extremities with injection of bilateral popliteal sheathes as well as both indwelling infusion catheters to assess venous patency from the level of the popliteal veins bilaterally through the iliac veins and inferior  vena cava, including at the level of the IVC filter.  Right common iliac and external iliac veins were dilated with a 10 mm x 4 cm Conquest balloon. Additional venography was performed at the level of the common femoral veins  bilaterally via 5 French catheters. Upon completion of the procedure, IV heparin was turned off and manual compression was held at both popliteal venous access sites after sheath removal. Thrombolytic therapy was discontinued.  Complications: None  FINDINGS: Venography demonstrates dramatically improved patency of bilateral popliteal and femoral veins in both lower extremities with antegrade flow reestablished and no significant residual thrombus present. A minimal amount of adherent mural thrombus was present in the right femoral vein. Left-sided iliac veins showed good patency and flow with no significant narrowing identified.  Right-sided iliac veins showed less rapid antegrade flow with some mural thrombus and stenosis present at the level of the common and external iliac veins. Iliac venous patency improved significantly after 10 mm balloon angioplasty with excellent antegrade flow present.  Visualized segment of the inferior vena cava shows no significant residual thrombus with flow present, including at the level of the indwelling IVC filter. There may be a minimal amount of residual mural thrombus remaining in the lower IVC which does not appear to be impeding flow.  IMPRESSION: Dramatic improvement in patency of bilateral lower extremity deep veins, iliac veins and the IVC. There was some sluggish flow associated with an area of stenosis of the right external and common iliac veins. This segment was treated with 10 mm balloon angioplasty with improved result.  PREPERATION AND PROCEDURE MEDICATIONS: 4.0 mg IV Versed and and 200 mcg IV fentanyl.  Sedation time: 45 min   Electronically Signed   By: Irish Lack   On: 05/12/2013 15:57   I have reviewed the IR images and I don't see  anything unusual that would account for an acute bleeding into the right calf.  Medical Decision Making  Jack [name] is a 67 y.o. male who presents with:  Likely acute right calf compartment syndrome in the setting of anticoagulation and recent pharmacomechanical thrombolysis for iliofemoral DVT, s/p TURP complicated by PE, bleeding complication from anticoagulation including perinephric hematoma, resolving ARF due to bladder obstruction from clot in bladder,    This patient sx and exams are consistent with early onset of acute compartment syndrome.  I recommend proceeding with emergent R calf four-compartment fasciotomy and placement of VAC dressings.  I discussed the procedure with the patient.  He is aware the risks for this case included: bleeding, nerve injury, inability to close the skin in the calves without additional procedures, non-resolution of preoperative symptoms, death, and stroke.  The patient is aware this patient is being done to help prevent permanent nerve and muscle injury in the right leg due to compartment syndrome.  The patient agrees to proceed with the procedure.  The patient will likely need transfusion during the case to help support his blood pressures.    Thank you for allowing [place] to participate in this patient's care.  Leonides Sake, MD [facility] [place] Office: [phone] Pager: [phone]  05/14/2013, 9:51 AM

## 2013-05-14 NOTE — Significant Event (Signed)
CBC    Component Value Date/Time   WBC 5.3 05/14/2013 0107   RBC 2.23* 05/14/2013 0107   HGB 6.4* 05/14/2013 0107   HCT 19.6* 05/14/2013 0107   PLT 169 05/14/2013 0107   MCV 87.9 05/14/2013 0107   MCH 28.7 05/14/2013 0107   MCHC 32.7 05/14/2013 0107   RDW 16.4* 05/14/2013 0107   LYMPHSABS 1.0 05/02/2013 0325   MONOABS 1.5* 05/02/2013 0325   EOSABS 0.1 05/02/2013 0325   BASOSABS 0.0 05/02/2013 0325    Has decreased Hb, but hemodynamics stable.  Will hold off on PRBC transfusion for now.  Coralyn Helling, MD 05/14/2013, 2:54 AM

## 2013-05-14 NOTE — Progress Notes (Signed)
PULMONARY  / CRITICAL CARE MEDICINE  Name: Jack [name] MRN: [id number] DOB: 07/13/1946    ADMISSION DATE:  04/30/2013 CONSULTATION DATE:  05/14/2013  REFERRING MD :  Butler Denmark PRIMARY SERVICE: PCCM  CHIEF COMPLAINT:  Right LE pain, weakness  BRIEF PATIENT DESCRIPTION: 67 y.o. male with complicated medical history including a recent stay at [facility] for PE S/P TURP 8/29  who required subsequent bilateral renal stents for obstruction due to hemorrhagic complications of anticoagulation.Pt was seen at [facility]  9/2 for V/Q showing likely PE. PT had CT showing L renal hematoma.9/5 - transferred to [facility] to PCCM service - noted to have large clot in bladder w/ B hydronephrosis 9/5 - IVC filter placement. In cone he demonstrated bilateral large clot burden DVT in lower ext and IVC was clotted.  Decision was made to perform a catheter directed lysis of clot and patient was transferred to the ICU for monitoring and further care while lytics are on.  SIGNIFICANT EVENTS / STUDIES:  8/29 - TURP w/ L ureteral stent [facility]  9/02 - admit [facility] w/ V/Q "high probability" for PE >> anticoag >> severe hematuria + anemia + renal failure  9/3 - CT abdom reveals large L renal capsular hematoma  9/5 - transferred to [facility] to PCCM service - noted to have large clot in bladder w/ B hydronephrosis  9/5 - IVC filter placement  9/6 - B ureteral stents placed - open evacuation of clot from bladder- suprapubic tube placed  9/7- venous duplex lower extremity - NO DVT  9/21-venous duplex lower extremity- Bilateral: Positive for DVT in the common femoral, profunda, femoral, popliteal, and posterior tibial veins with superficial thrombus in the greater and lesser saphenous veins.  9/22 - IV heparin initiated for DVTs (anticoagulation had been on hold due to renal hematoma)  9/28-Bilateral popliteal venous acc with initiation of bilateral LE and IVC thrombolysis, popliteal sheaths 9/29- IR inferior  venacavagram, thrombolysis/thrombectomy bilateral lower extremities showing improved patency, IVC patent. Advised d/c lytic therapy, continue heparin per pharmacy  9/30 - CT abd/pelvis showed stable L renal hematoma 10/1- Acute hematoma rt leg below knee, compartment syndrome concerns  LINES / TUBES: PIV 9/28 IR placed bilateral sheaths for lytic drip Suprapubic catheter   CULTURES: 9/17 Blood>>>NTD 9/28 MRSA>> Neg   ANTIBIOTICS: None  SUBJECTIVE:  Pain rt leg  VITAL SIGNS: Temp:  [98.1 F (36.7 C)-99 F (37.2 C)] 98.5 F (36.9 C) (10/01 0736) Pulse Rate:  [71-91] 85 (10/01 0600) Resp:  [15-25] 21 (10/01 0600) BP: (110-141)/(49-74) 138/64 mmHg (10/01 0600) SpO2:  [96 %-100 %] 97 % (10/01 0600) Weight:  [302 lb 14.6 oz (137.4 kg)] 302 lb 14.6 oz (137.4 kg) (10/01 0500)    INTAKE / OUTPUT: Intake/Output     09/30 0701 - 10/01 0700 10/01 0701 - 10/02 0700   P.O.     I.V. (mL/kg) 670 (4.9)    IV Piggyback     Total Intake(mL/kg) 670 (4.9)    Urine (mL/kg/hr) 2145 (0.7)    Other 275 (0.1)    Total Output 2420     Net -1750          Stool Occurrence 1 x      PHYSICAL EXAMINATION: General:  Obese male, NAD, resting comfortably, has pain in right LE Neuro:  Alert and interactive, MAEs HEENT:  [place]/AT, PERRL, EOMI Cardiovascular:  RRR, no m/r/g Lungs:  Bibasilar crackles, decreased sounds on left Abdomen:  Soft, NT, ND and +BS. Extremities: Bilateral  leg pitting edema, 2+, mild erythema on both legs, pt having pain with dorsiflexion of right LE, warm to palpation and swollen below knee, hematoma   LABS:  CBC Recent Labs     05/13/13  0355  05/13/13  1440  05/14/13  0107  WBC  7.1  6.5  5.3  HGB  7.1*  7.0*  6.4*  HCT  21.2*  20.8*  19.6*  PLT  191  172  169   Coag's Recent Labs     05/11/13  1155  APTT  78*  INR  1.13   BMET Recent Labs     05/12/13  0545  05/13/13  0355  05/14/13  0107  NA  138  137  138  K  4.1  4.2  4.3  CL  105  107  107   CO2  24  22  23   BUN  38*  36*  31*  CREATININE  1.82*  1.66*  1.38*  GLUCOSE  100*  109*  117*   Electrolytes Recent Labs     05/12/13  0545  05/13/13  0355  05/14/13  0107  CALCIUM  8.4  7.8*  7.5*  MG  1.8   --    --   PHOS  3.7   --    --    Glucose Recent Labs     05/11/13  1941  GLUCAP  103*    ASSESSMENT / PLAN:  PULMONARY A:  No respiratory concerns Hx PE  IVC filter placed- had f/u angioplasty with improvement in patency 9/29 P:   - IS per RT protocol. -Sit upright when able -PT -allow neg balance -should be extubatable post op, no distress pre op on min O2  CARDIOVASCULAR A:  DVT/PE Marginal BP prior- on Midodrine Recent asymptomatic brady- preload reduction from clotted filter P:  - Tele monitoring -cbc , see heme -heparin off STAT  RENAL A:   Renal failure with left sided hematoma - tPA Hematuria- resolving  Severe edema lower ext P:   - Monitor BMET -see heme. -neg balance on own, improved CRT, allow daily  GASTROINTESTINAL/GU A:  May need fasciaotomy P:  - Heart healthy diet -npo -ppi addition  HEMATOLOGIC A:   tPA given as a drip for DVT / clot filter- d/c lytic therapy Hematoma rt lower ext acute P: - H&H repeat in q12h schedule, may need Tx NOW with hematoma -dc heparin STAT -CT showed stable L renal hematoma no concern for expansion -Concern for R LE hematoma  And compartment, vasc/ortho called -need recent pt, inr ASAP -vit K , ffp wold likely harm -pulses q1h rt lower  INFECTIOUS A:   No active issues. P:   - Monitor WBC and fever curve and lower ext exam  ENDOCRINE A:   No active issues. P:   - Monitor.  NEUROLOGIC A:   Pain anxiety P: Supportive care PT Morphine , oxycodone Will need pain meds post op if to OR   I have personally obtained a history, examined the patient, evaluated laboratory and imaging results, formulated the assessment and plan and placed orders.  Nathanial Rancher PA-  student [facility]   Mcarthur Rossetti. Tyson Alias, MD, FACP Pgr: [phone]  Pulmonary & Critical Care

## 2013-05-14 NOTE — Progress Notes (Deleted)
OPERATIVE NOTE   PROCEDURE: 1. Right calf four-compartment fasciotomy 2. Negative pressure dressing placement  PRE-OPERATIVE DIAGNOSIS: Right calf compartment syndrome  POST-OPERATIVE DIAGNOSIS: same as above   SURGEON: Leonides Sake, MD  ASSISTANT(S): Lianne Cure, PAC   ANESTHESIA: general  ESTIMATED BLOOD LOSS: 100 cc  FINDING(S): 1. Pressurized hematoma in deep compartment with dissecting hematoma along deep and superficial posterior fascia 2. Evidence of compartment physiology as evident by resumption of arteriole bleeding after releasing deep posterior compartment 3. Extensive clot involvement of calf musculature 4. No anterior or lateral compartment involvement 5. Limited response to electrical stimulation of soleus and medial gastrocneumius 6. No frank active bleeding from popliteal vessels  SPECIMEN(S):  none  INDICATIONS:   Jack [name] is a 67 y.o. male with complex medical history s/p pharmacomechanical thrombectomy who presents with acute onset of right calf compartment syndrome.  I discussed the procedure with the patient. He is aware the risks for this case included: bleeding, nerve injury, inability to close the skin in the calves without additional procedures, non-resolution of preoperative symptoms, death, and stroke.  The patient agreed to proceed.  DESCRIPTION: After obtaining full informed written consent, the patient was brought back to the operating room and placed supine upon the operating table.  The patient received IV antibiotics prior to induction.  After obtaining adequate anesthesia, the patient was prepped and draped in the standard fashion for: right calf fasciotomy.  I made an incision medially one finger-width behind the tibia from level of tibial plateau down to level of ankle and dissected through the subcutaneous tissue which was extremely edematous.  Multiple veins were ligated or clipped in this process.  The greater saphenous vein in the calf  was ligated as it was repeatedly injured in this process.  The superficial fascia was already bulging.  I opened this layer along the entire length of the incision with electrocautery.  Some venous blood and clot were immediately obvious.  I then opened the deep compartment fascia with electrocautery, which decompressed a hematoma in the popliteal space which was under tension.  I evacuated ~50 cc of fresh clot from the popliteal space.  There was no active bleeding from the popliteal vessels.  I packed this wound with lap pads and turned my attention to the lateral calf.  I made an longitudinal incision between the tibia and fibula from the tibial plateau down to the level of proximal ankle.  I dissected through the subcutaneous tissue with electrocautery, ligating and clipping small veins in the process.  I open the fascia with electrocautery overlying the anterior compartment along the entire length of the leg.  I then then identified the intercompartmental fascia to find the lateral compartment.  I opened the the lateral fascia with electrocautery along the entire length of the leg.  Muscle throughout both compartments was viable without evidence of any involvement.    I turned my attention back to the medial incision.  After removing all lap pads, no further active bleeding was present.  The previous bleeding in the popliteal space had dissected out the medial gastrocneumius and soleus muscles.  Both muscles had limited response to electric stimulation.  I elected to place a VAC dressing on both incisions.   I fashion a large VAC sponge for the medial incision.  The sponge was affixed with adhesive strips.  i cut a hole in the adhesive strips and attached the lilypad.  The VAC sponge was connected to the suction circuit to get evacuation  from the [facility] sponge and prevent clotting.  I turned my attention to the lateral incision, no active bleeding was present.  I fashion a large VAC sponge for the lateral  incision.  The sponge was affixed with adhesive strips.  i cut a hole in the adhesive strips and attached the lilypad.  The both lilypads were connected to a Y-connector which was connected to the VAC pump.  The pump was set to 125 mm Hg continuous.  No leak was visualized.  COMPLICATIONS: none  CONDITION: stable  Leonides Sake, MD [facility] [place] Office: [phone] Pager: [phone]  05/14/2013, 1:11 PM

## 2013-05-15 LAB — CBC
HCT: 22.7 % — ABNORMAL LOW (ref 39.0–52.0)
Hemoglobin: 7.8 g/dL — ABNORMAL LOW (ref 13.0–17.0)
MCH: 29.6 pg (ref 26.0–34.0)
MCHC: 34.1 g/dL (ref 30.0–36.0)
MCHC: 34.3 g/dL (ref 30.0–36.0)
MCV: 86.9 fL (ref 78.0–100.0)
Platelets: 154 10*3/uL (ref 150–400)
Platelets: 140 10*3/uL — ABNORMAL LOW (ref 150–400)
RBC: 2.6 MIL/uL — ABNORMAL LOW (ref 4.22–5.81)
RDW: 15.7 % — ABNORMAL HIGH (ref 11.5–15.5)
RDW: 15.8 % — ABNORMAL HIGH (ref 11.5–15.5)
RDW: 15.8 % — ABNORMAL HIGH (ref 11.5–15.5)
WBC: 7.2 10*3/uL (ref 4.0–10.5)

## 2013-05-15 LAB — HEPARIN LEVEL (UNFRACTIONATED)
Heparin Unfractionated: 0.21 IU/mL — ABNORMAL LOW (ref 0.30–0.70)
Heparin Unfractionated: 0.47 IU/mL (ref 0.30–0.70)

## 2013-05-15 LAB — MYOGLOBIN, SERUM: Myoglobin: 116 ng/mL — ABNORMAL HIGH (ref <111)

## 2013-05-15 MED ORDER — FUROSEMIDE 10 MG/ML IJ SOLN
20.0000 mg | Freq: Two times a day (BID) | INTRAMUSCULAR | Status: DC
  Administered 2013-05-15 – 2013-05-18 (×4): 20 mg via INTRAVENOUS
  Filled 2013-05-15 (×8): qty 2

## 2013-05-15 NOTE — Progress Notes (Signed)
PULMONARY  / CRITICAL CARE MEDICINE  Name: Jack [name] MRN: [id number] DOB: 12-23-1945    ADMISSION DATE:  04/30/2013 CONSULTATION DATE:  05/15/2013  REFERRING MD :  Butler Denmark PRIMARY SERVICE: PCCM  CHIEF COMPLAINT:  Right LE pain  BRIEF PATIENT DESCRIPTION: 67 y.o. male with complicated medical history including a recent stay at [facility] for PE S/P TURP 8/29  who required subsequent bilateral renal stents for obstruction due to hemorrhagic complications of anticoagulation.Pt was seen at [facility]  9/2 for V/Q showing likely PE. PT had CT showing L renal hematoma.9/5 - transferred to [facility] to PCCM service - noted to have large clot in bladder w/ B hydronephrosis 9/5 - IVC filter placement. In cone he demonstrated bilateral large clot burden DVT in lower ext and IVC was clotted.  Decision was made to perform a catheter directed lysis of clot and patient was transferred to the ICU for monitoring and further care while lytics are on.  SIGNIFICANT EVENTS / STUDIES:  8/29 - TURP w/ L ureteral stent [facility]  9/02 - admit [facility] w/ V/Q "high probability" for PE >> anticoag >> severe hematuria + anemia + renal failure  9/3 - CT abdom reveals large L renal capsular hematoma  9/5 - transferred to [facility] to PCCM service - noted to have large clot in bladder w/ B hydronephrosis  9/5 - IVC filter placement  9/6 - B ureteral stents placed - open evacuation of clot from bladder- suprapubic tube placed  9/7- venous duplex lower extremity - NO DVT  9/21-venous duplex lower extremity- Bilateral: Positive for DVT in the common femoral, profunda, femoral, popliteal, and posterior tibial veins with superficial thrombus in the greater and lesser saphenous veins.  9/22 - IV heparin initiated for DVTs (anticoagulation had been on hold due to renal hematoma)  9/28-Bilateral popliteal venous acc with initiation of bilateral LE and IVC thrombolysis, popliteal sheaths 9/29- IR inferior venacavagram,  thrombolysis/thrombectomy bilateral lower extremities showing improved patency, IVC patent. Advised d/c lytic therapy, continue heparin per pharmacy  9/30 - CT abd/pelvis showed stable L renal hematoma 10/1- Acute hematoma rt leg below knee.Fasciotomy performed on RLE for compartment syndrome, then 8 hrs later hep restarted 10/2 - patient waiting for stepdown bed. Continued to be followed by vascular surgery, neg 3 liters with improved renal fxn  LINES / TUBES: PIV 9/28 IR placed bilateral sheaths for lytic drip Suprapubic catheter   CULTURES: 9/17 Blood>>>NTD 9/28 MRSA>> Neg   ANTIBIOTICS: Ancef 10/1 >>> single dose for Fasciotomy  SUBJECTIVE:  Patient resting comfortably today. No concerns. Drains in place following RLE Fasciotomy.  VITAL SIGNS: Temp:  [98.1 F (36.7 C)-99 F (37.2 C)] 98.1 F (36.7 C) (10/02 0418) Pulse Rate:  [86-114] 86 (10/02 0400) Resp:  [11-28] 20 (10/02 0400) BP: (81-144)/(41-79) 137/63 mmHg (10/02 0100) SpO2:  [94 %-100 %] 96 % (10/02 0400) Weight:  [290 lb (131.543 kg)] 290 lb (131.543 kg) (10/02 0500)    INTAKE / OUTPUT: Intake/Output     10/01 0701 - 10/02 0700   I.V. (mL/kg) 868.3 (6.6)   Blood 631   Total Intake(mL/kg) 1499.3 (11.4)   Urine (mL/kg/hr) 3605 (1.1)   Drains 850 (0.3)   Blood 100 (0)   Total Output 4555   Net -3055.7         PHYSICAL EXAMINATION: General:  Obese male, NAD, resting comfortably Neuro:  Alert and interactive, MAEs HEENT:  [place]/AT, PERRL, EOMI Cardiovascular:  RRR, no m/r/g Lungs:  Bibasilar crackles, decreased  sounds on left Abdomen:  Soft, NT, ND and +BS. Extremities: Bilateral leg pitting edema, 2+, mild erythema on both legs, RLE with VAC dressings on both medial and lateral incisions, SCD on LLE,  distal pulses intact bilaterally  LABS:  CBC Recent Labs     05/14/13  1457  05/15/13  0150  05/15/13  0410  WBC  6.4  7.2  7.4  HGB  7.8*  7.8*  7.7*  HCT  23.4*  22.7*  22.6*  PLT  150  149*  154    Coag's Recent Labs     05/14/13  1020  APTT  41*  INR  1.21   BMET Recent Labs     05/13/13  0355  05/14/13  0107  NA  137  138  K  4.2  4.3  CL  107  107  CO2  22  23  BUN  36*  31*  CREATININE  1.66*  1.38*  GLUCOSE  109*  117*   Electrolytes Recent Labs     05/13/13  0355  05/14/13  0107  CALCIUM  7.8*  7.5*   Glucose No results found for this basename: GLUCAP,  in the last 72 hours  ASSESSMENT / PLAN:  PULMONARY A:  No respiratory concerns Hx PE  IVC filter placed- had f/u angioplasty with improvement in patency 9/29 P:   - IS per RT protocol. -Sit upright when able -PT -allow neg balance  CARDIOVASCULAR A:  DVT/PE Marginal BP prior- on Midodrine Recent asymptomatic brady- preload reduction from clotted filter P:  - Tele monitoring -cbc q 12 to daily in am if not drop further  RENAL A:   Renal failure with left sided hematoma - tPA Hematuria- resolving  Severe edema lower ext P:   - Monitor BMET -neg balance noted, allow again goal neg 2 liters, re add lasix if not meeting these goals  GASTROINTESTINAL/GU A:  Resume diet- Renal/ heart healthy P:  -Continue PPI  -diet  HEMATOLOGIC A:   tPA given as a drip for DVT / clot filter- d/c lytic therapy Hematoma rt lower ext acute-- Fasciotomy performed  P: - H&H repeat in q12h schedule, to qday if no drop -also will look for hemoconcentration with lasix restart - Heparin restarted - continue to monitor distal pulses of LE - consider transfusion if hgb < 7 -fecal occult positive  INFECTIOUS A:   No active issues. P:   - Monitor WBC and fever curve and lower ext exam  ENDOCRINE A:   No active issues. P:   - Monitor.  NEUROLOGIC A:   Pain anxiety P: Supportive care PT Morphine , oxycodone  To floor, triad  I have personally obtained a history, examined the patient, evaluated laboratory and imaging results, formulated the assessment and plan and placed  orders.  Nathanial Rancher PA- student [facility]   Mcarthur Rossetti. Tyson Alias, MD, FACP Pgr: [phone] [facility]

## 2013-05-15 NOTE — Progress Notes (Signed)
Pt transferred to the unit. Pt is stable, alert and oriented per baseline. Oriented to room, staff, and call bell. Educated to call for any assistance. Bed in lowest position, call bell within reach- will continue to monitor. 

## 2013-05-15 NOTE — Progress Notes (Signed)
OT Cancellation Note and Discharge  Patient Details Name: Jack [name] MRN: [id number] DOB: 25-Oct-1945   Cancelled Treatment:    Reason Eval/Treat Not Completed: Medical issues which prohibited therapy. Pt remains on bedrest and had surgery yesterday on leg with back to OR on Monday. Will sign off an await new orders when pt can increase activity.  Evette Georges [phone] 05/15/2013, 7:31 AM

## 2013-05-15 NOTE — Anesthesia Postprocedure Evaluation (Signed)
  Anesthesia Post-op Note  Patient: Jack [name]  Procedure(s) Performed: Procedure(s): FASCIOTOMY with wound vac placement (Right) APPLICATION OF WOUND VAC (Right)  Patient Location: PACU  Anesthesia Type:General  Level of Consciousness: awake  Airway and Oxygen Therapy: Patient Spontanous Breathing  Post-op Pain: mild  Post-op Assessment: Post-op Vital signs reviewed  Post-op Vital Signs: stable  Complications: No apparent anesthesia complications

## 2013-05-15 NOTE — Progress Notes (Signed)
[facility] [place]  Daily Progress Note  Assessment/Planning: POD #1 s/p R calf 4-comp. Fasciotomy, VAC placement   H/H: some degree of ongoing slow ooze which is expected given raw surface present due to hydrodissection by bleeding in popliteal fossa, will be made worse by [facility], transfusion per primary team  Anticoagulation resumed.  Pt is aware of the risk-benefit analysis in this difficult situation.  I agree that anticoagulation is indicated and should be continued short of onset of severe bleeding  Myoglobin elevated suggesting some muscle death but CK essentially normal.  I suspect full recovery of muscle function now that compartment physiology has been released.  The only question if any nerve injury has already developed.  Cont VAC until Monday.  Back to OR on Monday for closure of lateral incision  Subjective  - 1 Day Post-Op  Pain much better in right foot  Objective Filed Vitals:   05/15/13 0418 05/15/13 0500 05/15/13 0600 05/15/13 0625  BP:    113/58  Pulse:  86 85 92  Temp: 98.1 F (36.7 C)     TempSrc: Oral     Resp:  20 19 17   Height:      Weight:  290 lb (131.543 kg)    SpO2:  96% 97% 98%    Intake/Output Summary (Last 24 hours) at 05/15/13 0708 Last data filed at 05/15/13 0600  Gross per 24 hour  Intake 1539.33 ml  Output   4740 ml  Net -3200.67 ml    PULM  CTAB CV  RRR GI  soft, NTND VASC  R calf incisions VAC adherent: 850 cc serosang MS  R foot DF/PF 2-3/5, sensation intact, able to wiggle toes  Laboratory CBC    Component Value Date/Time   WBC 7.4 05/15/2013 0410   HGB 7.7* 05/15/2013 0410   HCT 22.6* 05/15/2013 0410   PLT 154 05/15/2013 0410    BMET    Component Value Date/Time   NA 138 05/14/2013 0107   K 4.3 05/14/2013 0107   CL 107 05/14/2013 0107   CO2 23 05/14/2013 0107   GLUCOSE 117* 05/14/2013 0107   BUN 31* 05/14/2013 0107   CREATININE 1.38* 05/14/2013 0107   CALCIUM 7.5* 05/14/2013 0107   GFRNONAA 52*  05/14/2013 0107   GFRAA 60* 05/14/2013 0107   Myoglobin 116  CK total 27  Leonides Sake, MD [facility] [place] Office: [phone] Pager: [phone]  05/15/2013, 7:08 AM

## 2013-05-15 NOTE — Progress Notes (Signed)
1 Day Post-Op  Subjective: Rt calf fasciotomy performed by Dr Imogene Burn 10/1 Pt is in much less pain today. Moving feet and toes; good sensation  Objective: Vital signs in last 24 hours: Temp:  [98 F (36.7 C)-99 F (37.2 C)] 98 F (36.7 C) (10/02 0835) Pulse Rate:  [85-108] 85 (10/02 0800) Resp:  [11-24] 21 (10/02 0800) BP: (81-144)/(41-79) 134/59 mmHg (10/02 0800) SpO2:  [94 %-100 %] 97 % (10/02 0800) Weight:  [290 lb (131.543 kg)] 290 lb (131.543 kg) (10/02 0500) Last BM Date: 05/15/13  Intake/Output from previous day: 10/01 0701 - 10/02 0700 In: 1539.3 [I.V.:908.3; Blood:631] Out: 4740 [Urine:3790; Drains:850; Blood:100] Intake/Output this shift: Total I/O In: -  Out: 250 [Urine:250]  PE:  Afeb; vss Hg 7.7 this am Rt calf fasciotomy- wound vac x 2 Good sensation; good movement rt leg and left NT to touch rt leg No sign of infection   Lab Results:   Recent Labs  05/15/13 0150 05/15/13 0410  WBC 7.2 7.4  HGB 7.8* 7.7*  HCT 22.7* 22.6*  PLT 149* 154   BMET  Recent Labs  05/13/13 0355 05/14/13 0107  NA 137 138  K 4.2 4.3  CL 107 107  CO2 22 23  GLUCOSE 109* 117*  BUN 36* 31*  CREATININE 1.66* 1.38*  CALCIUM 7.8* 7.5*   PT/INR  Recent Labs  05/14/13 1020  LABPROT 15.0  INR 1.21   ABG No results found for this basename: PHART, PCO2, PO2, HCO3,  in the last 72 hours  Studies/Results: Ct Abdomen Pelvis Wo Contrast  05/13/2013   *RADIOLOGY REPORT*  Clinical Data: reassess renal hematomas  CT ABDOMEN AND PELVIS WITHOUT CONTRAST  Technique:  Multidetector CT imaging of the abdomen and pelvis was performed following the standard protocol without intravenous contrast.  Comparison: 05/10/13  Findings: Slight increase in small bilateral pleural effusions.  Liver, gallbladder, spleen, pancreas, and adrenal glands normal and unchanged, except for incidental punctate calcification right adrenal gland.  Bilateral ureteral stents unchanged.  16 mm thick   subcapsular hematoma left kidney stable.  Large left upper pole cyst stable. Small right exophytic mid pole cyst stable.  Suprapubic bladder catheter unchanged.  IVC filter stable.  Bowel remains unremarkable.  IMPRESSION: Stable subcapsular left renal hematoma.Small bilateral pleural effusions, mildly increased.   Original Report Authenticated By: Esperanza Heir, M.D.    Anti-infectives: Anti-infectives   Start     Dose/Rate Route Frequency Ordered Stop   05/14/13 1145  ceFAZolin (ANCEF) IVPB 2 g/50 mL premix     2 g 100 mL/hr over 30 Minutes Intravenous To Surgery 05/14/13 1141 05/14/13 1145   05/03/13 1600  piperacillin-tazobactam (ZOSYN) IVPB 3.375 g     3.375 g 12.5 mL/hr over 240 Minutes Intravenous 3 times per day 05/03/13 1222 05/04/13 0213   05/03/13 1000  vancomycin (VANCOCIN) 1,500 mg in sodium chloride 0.9 % 500 mL IVPB  Status:  Discontinued     1,500 mg 250 mL/hr over 120 Minutes Intravenous Every 48 hours 05/01/13 1105 05/01/13 1403   05/01/13 1000  vancomycin (VANCOCIN) 1,500 mg in sodium chloride 0.9 % 500 mL IVPB  Status:  Discontinued     1,500 mg 250 mL/hr over 120 Minutes Intravenous Every 24 hours 05/01/13 0105 05/01/13 1105   04/30/13 1600  piperacillin-tazobactam (ZOSYN) IVPB 2.25 g  Status:  Discontinued     2.25 g 100 mL/hr over 30 Minutes Intravenous Every 8 hours 04/30/13 1342 05/03/13 1222   04/30/13 1200  vancomycin (VANCOCIN)  IVPB 1000 mg/200 mL premix  Status:  Discontinued     1,000 mg 200 mL/hr over 60 Minutes Intravenous Every 12 hours 04/30/13 0245 04/30/13 1326   04/30/13 0800  piperacillin-tazobactam (ZOSYN) IVPB 3.375 g  Status:  Discontinued     3.375 g 12.5 mL/hr over 240 Minutes Intravenous Every 8 hours 04/30/13 0245 04/30/13 1326   04/30/13 0300  vancomycin (VANCOCIN) 2,000 mg in sodium chloride 0.9 % 500 mL IVPB     2,000 mg 250 mL/hr over 120 Minutes Intravenous  Once 04/30/13 0245 04/30/13 0510   04/30/13 0300  piperacillin-tazobactam  (ZOSYN) IVPB 3.375 g     3.375 g 100 mL/hr over 30 Minutes Intravenous  Once 04/30/13 0245 04/30/13 0340      Assessment/Plan: s/p Procedure(s): FASCIOTOMY with wound vac placement (Right) APPLICATION OF WOUND VAC (Right)  IVC and B LE dvt- thrombolysis 9/28 - 9/29 Developed Rt calf pain and hematoma To OR 10/1 for fasciotomy Feeling better today Back to OR to close 1 wound vac area Mon;  then the other few weeks later per pt   LOS: 15 days    Kirstie Larsen A 05/15/2013

## 2013-05-15 NOTE — Progress Notes (Signed)
ANTICOAGULATION CONSULT NOTE - Follow Up Consult  Pharmacy Consult for heparin Indication: DVT  Labs:  Recent Labs  05/13/13 0355  05/14/13 0107 05/14/13 0301 05/14/13 1020 05/14/13 1456  05/15/13 0150 05/15/13 0410 05/15/13 1307  HGB 7.1*  < > 6.4*  --   --   --   < > 7.8* 7.7* 7.3*  HCT 21.2*  < > 19.6*  --   --   --   < > 22.7* 22.6* 21.3*  PLT 191  < > 169  --   --   --   < > 149* 154 140*  APTT  --   --   --   --  41*  --   --   --   --   --   LABPROT  --   --   --   --  15.0  --   --   --   --   --   INR  --   --   --   --  1.21  --   --   --   --   --   HEPARINUNFRC 0.34  --   --  0.54  --   --   --   --  0.21* 0.47  CREATININE 1.66*  --  1.38*  --   --   --   --   --   --   --   CKTOTAL  --   --   --   --   --  27  --   --   --   --   < > = values in this interval not displayed.   Assessment/Plan:  67yo male therapeutic on heparin after resuming, tenecteplase now d/c'd.  Patient with significant drop in Hgb with known renal hematomas. CT abd/pelvis -stable. This am with swelling on RLE and hematoma >> went to OR for concern of compartment syndrome now s/p fasciotomy. Heparin restarted last night. AM heparin level slightly low but drawn early. Repeat heparin level is therapeutic.   Plan: Continue at 2000 units/hr. Follow-up daily heparin level and CBC.   Link Snuffer, PharmD, BCPS Clinical Pharmacist [phone] 05/15/2013,3:02 PM

## 2013-05-15 NOTE — Progress Notes (Signed)
eLink Physician-Brief Progress Note Patient Name: Jack [name] DOB: 1946-02-19 MRN: [id number]  Date of Service  05/15/2013   HPI/Events of Note  triage  eICU Interventions  SDU bed ordered   Intervention Category Minor Interventions: Routine modifications to care plan (e.g. PRN medications for pain, fever)  Shan Levans 05/15/2013, 12:10 AM

## 2013-05-15 NOTE — Progress Notes (Signed)
ANTICOAGULATION CONSULT NOTE - Follow Up Consult  Pharmacy Consult for heparin Indication: DVT  Labs:  Recent Labs  05/13/13 0355  05/14/13 0107 05/14/13 0301 05/14/13 1020 05/14/13 1456 05/14/13 1457 05/15/13 0150 05/15/13 0410  HGB 7.1*  < > 6.4*  --   --   --  7.8* 7.8* 7.7*  HCT 21.2*  < > 19.6*  --   --   --  23.4* 22.7* 22.6*  PLT 191  < > 169  --   --   --  150 149* 154  APTT  --   --   --   --  41*  --   --   --   --   LABPROT  --   --   --   --  15.0  --   --   --   --   INR  --   --   --   --  1.21  --   --   --   --   HEPARINUNFRC 0.34  --   --  0.54  --   --   --   --  0.21*  CREATININE 1.66*  --  1.38*  --   --   --   --   --   --   CKTOTAL  --   --   --   --   --  27  --   --   --   < > = values in this interval not displayed.   Assessment/Plan:  67yo man s/p fasciotomy with negative pressure dressing placement. Heparin resumed 8 hours post OR. Hg this am 7.7 after transfusion.  Heparin level subtherapeutic at 0.21 units/ml but level drawn ~4 hours after restart.  He was previously therapeutic on present rate.  Plan: Continue heparin at 2000 units/hr. Recheck heparin level in 6 hours.   Talbert Cage, PharmD Clinical Pharmacist [phone] 05/15/2013,5:49 AM

## 2013-05-16 ENCOUNTER — Inpatient Hospital Stay (HOSPITAL_COMMUNITY): Payer: PRIVATE HEALTH INSURANCE

## 2013-05-16 ENCOUNTER — Encounter (HOSPITAL_COMMUNITY): Payer: Self-pay | Admitting: Vascular Surgery

## 2013-05-16 LAB — CLOSTRIDIUM DIFFICILE BY PCR: Toxigenic C. Difficile by PCR: POSITIVE — AB

## 2013-05-16 LAB — CBC
Platelets: 157 10*3/uL (ref 150–400)
Platelets: 187 10*3/uL (ref 150–400)
RBC: 2.19 MIL/uL — ABNORMAL LOW (ref 4.22–5.81)
RDW: 15.3 % (ref 11.5–15.5)
WBC: 6.2 10*3/uL (ref 4.0–10.5)
WBC: 7.3 10*3/uL (ref 4.0–10.5)

## 2013-05-16 LAB — HEPARIN LEVEL (UNFRACTIONATED)
Heparin Unfractionated: 0.67 IU/mL (ref 0.30–0.70)
Heparin Unfractionated: 0.52 IU/mL (ref 0.30–0.70)

## 2013-05-16 MED ORDER — FUROSEMIDE 10 MG/ML IJ SOLN
40.0000 mg | Freq: Once | INTRAMUSCULAR | Status: AC
  Administered 2013-05-16: 40 mg via INTRAVENOUS
  Filled 2013-05-16: qty 4

## 2013-05-16 MED ORDER — METRONIDAZOLE 500 MG PO TABS
500.0000 mg | ORAL_TABLET | Freq: Three times a day (TID) | ORAL | Status: DC
  Administered 2013-05-16 – 2013-05-29 (×40): 500 mg via ORAL
  Filled 2013-05-16 (×43): qty 1

## 2013-05-16 MED ORDER — HEPARIN (PORCINE) IN NACL 100-0.45 UNIT/ML-% IJ SOLN
1900.0000 [IU]/h | INTRAMUSCULAR | Status: DC
  Administered 2013-05-16 (×2): 1900 [IU]/h via INTRAVENOUS
  Filled 2013-05-16 (×3): qty 250

## 2013-05-16 NOTE — Progress Notes (Signed)
[facility]   Daily Progress Note  Assessment/Planning: POD #2 s/p R leg fasciotomy   Plan rtn to OR on Monday to close lateral incision  Wound case nursing c/s to help with VAC   Pt has some foot drop, but not consistent with intraoperative findings, so I suspect the foot drop more like due to pain and poor effort at this point  Subjective  - 2 Days Post-Op  Pain unchg, better than preop  Objective Filed Vitals:   05/15/13 2200 05/15/13 2244 05/16/13 0500 05/16/13 0615  BP: 123/48 111/71  124/66  Pulse: 94 97  101  Temp:  98.7 F (37.1 C)  98.6 F (37 C)  TempSrc:  Oral  Oral  Resp: 18 18  18   Height:      Weight:  270 lb 11.6 oz (122.8 kg) 268 lb 15.4 oz (122 kg)   SpO2: 94% 98%  95%    Intake/Output Summary (Last 24 hours) at 05/16/13 0825 Last data filed at 05/16/13 0500  Gross per 24 hour  Intake     80 ml  Output   4504 ml  Net  -4424 ml   VASC  Lat. VAC adherent, medial VAC adherent, occlusion alert MS Poor DF, better PF, possible foot drop  Laboratory CBC    Component Value Date/Time   WBC 6.2 05/16/2013 0350   HGB 7.5* 05/16/2013 0350   HCT 22.1* 05/16/2013 0350   PLT 157 05/16/2013 0350    BMET    Component Value Date/Time   NA 138 05/14/2013 0107   K 4.3 05/14/2013 0107   CL 107 05/14/2013 0107   CO2 23 05/14/2013 0107   GLUCOSE 117* 05/14/2013 0107   BUN 31* 05/14/2013 0107   CREATININE 1.38* 05/14/2013 0107   CALCIUM 7.5* 05/14/2013 0107   GFRNONAA 52* 05/14/2013 0107   GFRAA 60* 05/14/2013 0107    Leonides Sake, MD [facility] [place] Office: [phone] Pager: [phone]  05/16/2013, 8:25 AM

## 2013-05-16 NOTE — Consult Note (Addendum)
WOC consult Note Reason for Consult: Consult requested for right leg vac troubleshooting.  Requested NOT to change vac dressing according to order; pt scheduled to go back to OR on Monday for change at that time.  Machine has been alarming "leak" Wound type:Right leg with 2 full thickness post-op fasciotomy sites Yed together. Unable to assess wound bed since vac dressing in place.     Drainage (amount, consistency, odor) Cannister with 300cc red drainage  Periwound:Entire leg very painful to touch, generalized edema. Dressing procedure/placement/frequency: Current track pad clogged with old clotted blood.  Dressing leaking out of distal edges since suction unable to function.  Applied new track pad and sealed vac drape edges.  Good seal at cont suction.  New cannister applied with mod amt frothy red drainage from wounds.   Please re-consult if further assistance is needed.  Thank-you,  Cammie Mcgee MSN, RN, CWOCN, [place], CNS [phone]

## 2013-05-16 NOTE — Progress Notes (Addendum)
Called by nursing for patient complaint of left leg swelling and tenderness -will give extra dose of lasix- seems SOB, has some pitting edema- no sign of bleeding/bruising (new) -will transfer back to SDU for closer monitoring as HR up, RR up and BP down. CBC dropped- page IR and vascular-- transfuse 1 unit  Marlin Canary

## 2013-05-16 NOTE — Progress Notes (Signed)
[facility]  Subjective  - His leg feels a lot better since the fasciotomy.  Nursing is concern with the wound vac settings.  The suction is low.  They will contact the wound vac care team for trouble shooting.    Objective 124/66 101 98.6 F (37 C) (Oral) 18 95%  Intake/Output Summary (Last 24 hours) at 05/16/13 0830 Last data filed at 05/16/13 0500  Gross per 24 hour  Intake     80 ml  Output   4504 ml  Net  -4424 ml   Vac output 1,050 for last 24 hours.  No significant drop in HMG.   Right lower extremity wound vac in place. Foot and ankle grossly intact active range of motion, sensation intact.  Assessment/Planning: Procedure(s): FASCIOTOMY with wound vac placement APPLICATION OF WOUND VAC  2 Days Post-OpSurgeon(s): Fransisco Hertz, MD  Plan OR Monday 05/19/2013 for I & D, possible lateral fasciotomy closure and wound vac change to medial compartment.    Clinton Gallant [facility] 05/16/2013 8:30 AM --  Laboratory Lab Results:  Recent Labs  05/15/13 1307 05/16/13 0350  WBC 6.6 6.2  HGB 7.3* 7.5*  HCT 21.3* 22.1*  PLT 140* 157   BMET  Recent Labs  05/14/13 0107  NA 138  K 4.3  CL 107  CO2 23  GLUCOSE 117*  BUN 31*  CREATININE 1.38*  CALCIUM 7.5*    COAG Lab Results  Component Value Date   INR 1.21 05/14/2013   INR 1.13 05/11/2013   INR 1.28 05/05/2013   No results found for this basename: PTT

## 2013-05-16 NOTE — Progress Notes (Signed)
Blood pressure dropped to 80's systolic, ns 250 bolus given. Then started on 1 unit prbc.. Continue to monitor.

## 2013-05-16 NOTE — Progress Notes (Signed)
Patient transferring to step-down bed.  Report given to Chat, RN on 2H.  Charge nurse and nurse tech are transporting patient to unit.  Receiving RN advised to call this RN with any questions.

## 2013-05-16 NOTE — Progress Notes (Signed)
Subjective: IVC and B LE dvt- thrombolysis 9/28-9/29  Developed Rt calf hematoma and required Rt calf fasciotomy 10/1  Rt calf has wound vacs x 2- followed by Dr Imogene Burn  Patient transferred to [facility] for drop in hgb 6.6 (7.5), receiving 1 unit PRBC. Patient denies any new or worsening complaints from earlier. He still c/o left leg site soreness at lysis site and left LE swelling increasing. He denies any abdominal pain.  Objective: Physical Exam: BP 80/46  Pulse 116  Temp(Src) 99.2 F (37.3 C) (Oral)  Resp 28  Ht 6' (1.829 m)  Wt 268 lb 15.4 oz (122 kg)  BMI 36.47 kg/m2  SpO2 93%   Rt LE- wound vacs intact  Working well now- needed tube change per chart  NT to touch foot  Swelling left LE, less movement in toes. Posterior leg at site- tender; no hematoma noted  No redness; minimal ecchymotic area directly at lysis site  Painful and heavy to move leg by himself  Labs: CBC  Recent Labs  05/16/13 0350 05/16/13 1415  WBC 6.2 7.3  HGB 7.5* 6.6*  HCT 22.1* 18.7*  PLT 157 187   BMET  Recent Labs  05/14/13 0107  NA 138  K 4.3  CL 107  CO2 23  GLUCOSE 117*  BUN 31*  CREATININE 1.38*  CALCIUM 7.5*   LFT No results found for this basename: PROT, ALBUMIN, AST, ALT, ALKPHOS, BILITOT, BILIDIR, IBILI, LIPASE,  in the last 72 hours PT/INR  Recent Labs  05/14/13 1020  LABPROT 15.0  INR 1.21     Studies/Results: No results found.  Assessment/Plan: s/p Procedure(s):  FASCIOTOMY with wound vac placement (Right)  APPLICATION OF WOUND VAC (Right)  B LE dvt/IVC- lysis 9/28-29  Onset R posterior leg hematoma/compartment syndrome 10/1  Fasciotomy with Dr Imogene Burn  Hgb drop 6.6 (7.5) receiving 1 unit PRBC now and transferred to [facility] floor. No new sites of pain or worsening pain since this morning.  L leg soreness and increased edema today  Will report to Dr Archer Asa   LOS: 16 days    Berneta Levins PA-C 05/16/2013 4:29 PM

## 2013-05-16 NOTE — Progress Notes (Signed)
Patient with new complaints of left knee/calf pain with left knee swelling.  Patient has also developed some weakness in the left lower extremity.   +3 edema is present in left lateral knee.  No obvious/overt bruising present.  Left posterior knee gauze dressing is intact with minimal bruising around the outside of the dressing.  Patient complains of some dyspnea with activity (rolling off of the bedpan).  MD made aware of findings.  Will monitor patient.

## 2013-05-16 NOTE — Progress Notes (Signed)
ANTICOAGULATION CONSULT NOTE - Follow Up Consult  Pharmacy Consult for heparin Indication: DVT  Allergies  Allergen Reactions  . Contrast Media [Iodinated Diagnostic Agents]     On 04/29/2013 spoke with patient he had some type of procedure at [facility] and broke out in hives after the exams in 1997, he did not know what of type of study he had.  I called [facility] Radiology department the only type of contrast they used during that time  was Conray    Patient Measurements: Height: 6' (182.9 cm) Weight: 268 lb 15.4 oz (122 kg) IBW/kg (Calculated) : 77.6 Heparin Dosing Weight: 104kg  Vital Signs: Temp: 98.3 F (36.8 C) (10/03 1956) Temp src: Oral (10/03 1956) BP: 96/51 mmHg (10/03 2000) Pulse Rate: 80 (10/03 2000)  Labs:  Recent Labs  05/14/13 0107  05/14/13 1020 05/14/13 1456  05/15/13 1307 05/16/13 0350 05/16/13 1415 05/16/13 1957  HGB 6.4*  --   --   --   < > 7.3* 7.5* 6.6*  --   HCT 19.6*  --   --   --   < > 21.3* 22.1* 18.7*  --   PLT 169  --   --   --   < > 140* 157 187  --   APTT  --   --  41*  --   --   --   --   --   --   LABPROT  --   --  15.0  --   --   --   --   --   --   INR  --   --  1.21  --   --   --   --   --   --   HEPARINUNFRC  --   < >  --   --   < > 0.47 0.67  --  0.52  CREATININE 1.38*  --   --   --   --   --   --   --   --   CKTOTAL  --   --   --  27  --   --   --   --   --   < > = values in this interval not displayed.  Estimated Creatinine Clearance: 71.1 ml/min (by C-G formula based on Cr of 1.38).     Assessment: 71 YOM who is s/p tenecteplase and fasciotomy for concern of compartment syndrome now with wound vac. Patient has recent history of renal hematomas which are stable on CT abd/pelvis. Heparin was resumed post-op on 10/1. Of note, the wound care RN noted red drainage from wound vac. Hgb and plts have decreased during hospitalization received 1 ut PRBC today.  Heparin drip 1900 uts/hr HL 0.52.   Goal of Therapy:   Heparin level 0.3-0.7 units/ml Monitor platelets by anticoagulation protocol: Yes   Plan:  Continue Heparin drip 1900 uts/hr Daily CBC, Heparin level Monitor s/s bleeding  Leota Sauers Pharm.D. CPP, BCPS Clinical Pharmacist [phone] 05/16/2013 9:16 PM

## 2013-05-16 NOTE — Progress Notes (Signed)
Transferred in from 5W by bed awake and alert.

## 2013-05-16 NOTE — Progress Notes (Signed)
[facility] PROGRESS NOTE  Jack [name] ZOX:[id number] DOB: November 13, 1945 DOA: 04/30/2013 PCP: Simone Curia, MD  67 y.o. male with complicated medical history including a recent stay at [facility] for PE S/P TURP who required subsequent bilateral renal stents for obstruction due to hemorrhagic complications of anticoagulation s/p eventual IVC filter. Patient was seen in the ED at [facility] 9/17 where his work up demonstrated creatinine of 6.0, WBC of 37k, potassium of 5.8, sodium 127, tachycardia, and mild hypotension. VQ scan was repeated which was negative for PE. He was given kayexelate in the ED, 2L NS, and transferred to [facility].  8/29 - TURP w/ L ureteral stent [facility]  9/02 - admit [facility] w/ V/Q "high probability" for PE >> anticoag >> severe hematuria + anemia + renal failure  9/3 - CT abdom reveals large L renal capsular hematoma  9/5 - transferred to [facility] to PCCM service - noted to have large clot in bladder w/ B hydronephrosis  9/5 - IVC filter placement  9/6 - B ureteral stents placed - open evacuation of clot from bladder- suprapubic tube placed  9/7- venous duplex lower extremity - NO DVT  9/21-venous duplex lower extremity- Bilateral: Positive for DVT in the common femoral, profunda, femoral, popliteal, and posterior tibial veins with superficial thrombus in the greater and lesser saphenous veins.  9/22 - IV heparin initiated for DVTs (anticoagulation had been on hold due to renal hematoma)  9/28: Decision was made to perform a catheter directed lysis of clot and patient was transferred to the ICU for monitoring and further care while lytics are on. 9/28-Bilateral popliteal venous acc with initiation of bilateral LE and IVC thrombolysis, popliteal sheaths  9/29- IR inferior venacavagram, thrombolysis/thrombectomy bilateral lower extremities showing improved patency, IVC patent. Advised d/c lytic therapy, continue heparin per pharmacy  9/30 - CT abd/pelvis showed  stable L renal hematoma  10/1- Acute hematoma rt leg below knee, compartment syndrome concerns 10/1- calf fasciotomy performed 10/6- OR Monday to close lateral incision       Assessment/Plan: c-diff colitis -start flagyl 10/3   Acute renal failure/ CKD 3  crt was 1.85 at time of d/c 9/10 - Nephrology has now signed off - ultrasound and Ct abd/pelvis reviewed and there are no acute findings - renal function improving continuously - most likely simple pre-renal state v/s ATN related to hypotension   Bilateral lower extremity DVT / PE  status post IVC filter 9/5 - given recent severe complications related to bleeding anticoagulation was not felt to be wise at the time of admission - further review of records revealed Dopplers from 9/7 which were negative for DVT - repeat dopplers this hospitalization confirmed extensive bilateral lower extremity DVTs - d/c'ed testosterone replacement - due to fear of propagation of clot through IVC filter - Will have thrombolysis  by IR - was in ICU - then had ?compartment syndrome and was taken to OR on 10/1- wound vac placed and then to be taken back to OR on 10/6  Progressive anemia  Likely a combination of significant recent acute loss plus decreased production - - hemoglobin holding steady - Hgb stable-  Hemorrhoidal GI bleed  Bleeding started 9/25  -bright red blood streaking in stools- appears to be from hemorrhoids - he has visible external hemorrhoids- no pain  - Hgb stable  - started treatment for hemorrhoids.   Leukocytosis  WBC now normalized - completed a five-day course of empiric antibiotic therapy   Hyperkalemia >> hypokalemia  Due  to above - replaced and now normalized   S/p transurethral resection of prostate with left ureteroscopic stone manipulation 04/13/13 complicated by perinephric hematoma with bilateral hydronephrosis  underwent urgent cystoscopy 04/18/2013 with bilateral stent placement and bladder evacuation with  suprapubic tube placement - Urology has seen in f/u   Hypotension - resolved  BP stable at this time - felt to be due to hypovolemia    Code Status: full Family Communication: patient Disposition Plan:    Consultants:  Nephrology  Vascular  IR  urology  LINES / TUBES:  PIV  9/28 IR placed bilateral sheaths for lytic drip  Suprapubic catheter  CULTURES:  9/17 Blood>>>NTD  9/28 MRSA>> Neg   ANTIBIOTICS:  Ancef 10/1 >>> single dose for Fasciotomy  HPI/Subjective: Pain in legs improved Some leakage around drain  Objective: Filed Vitals:   05/16/13 0615  BP: 124/66  Pulse: 101  Temp: 98.6 F (37 C)  Resp: 18    Intake/Output Summary (Last 24 hours) at 05/16/13 0943 Last data filed at 05/16/13 0857  Gross per 24 hour  Intake    320 ml  Output   5254 ml  Net  -4934 ml   Filed Weights   05/15/13 0500 05/15/13 2244 05/16/13 0500  Weight: 131.543 kg (290 lb) 122.8 kg (270 lb 11.6 oz) 122 kg (268 lb 15.4 oz)    Exam:   General:  Pleasant/alert  Cardiovascular: rrr  Respiratory: clear anterior  Abdomen: +BS, obese  Musculoskeletal: right leg with wound vacs   Data Reviewed: Basic Metabolic Panel:  Recent Labs Lab 05/10/13 0445 05/12/13 0545 05/13/13 0355 05/14/13 0107  NA 134* 138 137 138  K 4.6 4.1 4.2 4.3  CL 101 105 107 107  CO2 23 24 22 23   GLUCOSE 114* 100* 109* 117*  BUN 29* 38* 36* 31*  CREATININE 1.52* 1.82* 1.66* 1.38*  CALCIUM 8.2* 8.4 7.8* 7.5*  MG  --  1.8  --   --   PHOS  --  3.7  --   --    Liver Function Tests: No results found for this basename: AST, ALT, ALKPHOS, BILITOT, PROT, ALBUMIN,  in the last 168 hours No results found for this basename: LIPASE, AMYLASE,  in the last 168 hours No results found for this basename: AMMONIA,  in the last 168 hours CBC:  Recent Labs Lab 05/14/13 1457 05/15/13 0150 05/15/13 0410 05/15/13 1307 05/16/13 0350  WBC 6.4 7.2 7.4 6.6 6.2  HGB 7.8* 7.8* 7.7* 7.3* 7.5*  HCT 23.4*  22.7* 22.6* 21.3* 22.1*  MCV 88.0 86.6 86.9 86.9 86.0  PLT 150 149* 154 140* 157   Cardiac Enzymes:  Recent Labs Lab 05/14/13 1456  CKTOTAL 27   BNP (last 3 results)  Recent Labs  04/18/13 1245 04/27/13 1240  PROBNP 1762.0* 1260.0*   CBG:  Recent Labs Lab 05/11/13 1941  GLUCAP 103*    Recent Results (from the past 240 hour(s))  MRSA PCR SCREENING     Status: None   Collection Time    05/11/13  6:17 PM      Result Value Range Status   MRSA by PCR NEGATIVE  NEGATIVE Final   Comment:            The GeneXpert MRSA Assay (FDA     approved for NASAL specimens     only), is one component of a     comprehensive MRSA colonization     surveillance program. It is not  intended to diagnose MRSA     infection nor to guide or     monitor treatment for     MRSA infections.     Studies: No results found.  Scheduled Meds: . furosemide  20 mg Intravenous Q12H  . hydrocortisone   Rectal BID  . sodium chloride  3 mL Intravenous Q12H  . sodium chloride  3 mL Intravenous Q12H   Continuous Infusions: . sodium chloride 10 mL/hr at 05/13/13 1400  . heparin 2,000 Units/hr (05/16/13 0939)    Principal Problem:   Acute renal failure Active Problems:   Chronic renal insufficiency   Sepsis   Hyperkalemia   DVT, bilateral lower limbs   GI bleed   Pulmonary embolism 9/2    Time spent: 35    [facility], Lamiah Marmol  [facility] Pager [phone]. If 7PM-7AM, please contact night-coverage at [email or web address], password [employer] 05/16/2013, 9:43 AM  LOS: 16 days

## 2013-05-16 NOTE — Progress Notes (Signed)
ANTICOAGULATION CONSULT NOTE - Follow Up Consult  Pharmacy Consult for heparin Indication: DVT  Allergies  Allergen Reactions  . Contrast Media [Iodinated Diagnostic Agents]     On 04/29/2013 spoke with patient he had some type of procedure at [facility] and broke out in hives after the exams in 1997, he did not know what of type of study he had.  I called [facility] Radiology department the only type of contrast they used during that time  was Conray    Patient Measurements: Height: 6' (182.9 cm) Weight: 268 lb 15.4 oz (122 kg) IBW/kg (Calculated) : 77.6 Heparin Dosing Weight: 104kg  Vital Signs: Temp: 99.2 F (37.3 C) (10/03 1024) Temp src: Oral (10/03 1024) BP: 117/56 mmHg (10/03 1024) Pulse Rate: 121 (10/03 1024)  Labs:  Recent Labs  05/14/13 0107  05/14/13 1020 05/14/13 1456  05/15/13 0410 05/15/13 1307 05/16/13 0350  HGB 6.4*  --   --   --   < > 7.7* 7.3* 7.5*  HCT 19.6*  --   --   --   < > 22.6* 21.3* 22.1*  PLT 169  --   --   --   < > 154 140* 157  APTT  --   --  41*  --   --   --   --   --   LABPROT  --   --  15.0  --   --   --   --   --   INR  --   --  1.21  --   --   --   --   --   HEPARINUNFRC  --   < >  --   --   --  0.21* 0.47 0.67  CREATININE 1.38*  --   --   --   --   --   --   --   CKTOTAL  --   --   --  27  --   --   --   --   < > = values in this interval not displayed.  Estimated Creatinine Clearance: 71.1 ml/min (by C-G formula based on Cr of 1.38).   Medications:  Heparin at 2000units/hr  Assessment: 56 YOM who is s/p tenecteplase and fasciotomy for concern of compartment syndrome now with wound vac. Patient has recent history of renal hematomas which are stable on CT abd/pelvis. Heparin was resumed post-op on 10/1. Level this morning is therapeutic, but has increased to higher end of goal level. Of note, the wound care RN noted red drainage from wound vac. Hgb and plts have decreased during hospitalization, but this morning they  appear stable form yesterday's labs.  Goal of Therapy:  Heparin level 0.3-0.7 units/ml Monitor platelets by anticoagulation protocol: Yes   Plan:  1. Decrease heparin gtt slightly to 1900units/hr (about a 1unit/kg/hr decrease) to ensure patient's level does not continue to rise 2. Heparin level at 1800 3. Daily heparin level and CBC 4. Follow for any signs of bleeding or increased bloody drainage from wound vac  Nicasio Barlowe D. Brystol Wasilewski, PharmD Clinical Pharmacist Pager: [phone] 05/16/2013 11:31 AM

## 2013-05-16 NOTE — Progress Notes (Signed)
Md made aware of the  Of the hgb of 6.6 and  Gave order to continue with the heparin gtt.

## 2013-05-16 NOTE — Progress Notes (Signed)
CRITICAL VALUE ALERT  Critical value received:  C-Diff PCR is positive  Date of notification:  05/16/13  Time of notification:  1120  Critical value read back:yes  Nurse who received alert:  Lavell Anchors, RN  MD notified (1st page):  Verner Mould  Time of first page:  1121  MD notified (2nd page):  Time of second page:  Responding MD:  Verner Mould  Time MD responded:  [phone]

## 2013-05-16 NOTE — Progress Notes (Signed)
2 Days Post-Op  Subjective: IVC and B LE dvt- thrombolysis 9/28-9/29 Developed Rt calf hematoma and required Rt calf fasciotomy 10/1 Pt better- Rt calf has wound vacs x 2- followed by Dr Imogene Burn Complains today Left leg sl sore- esp at lysis site  Objective: Vital signs in last 24 hours: Temp:  [98.6 F (37 C)-99.2 F (37.3 C)] 99.2 F (37.3 C) (10/03 1024) Pulse Rate:  [84-121] 121 (10/03 1024) Resp:  [17-22] 20 (10/03 1024) BP: (111-153)/(48-102) 117/56 mmHg (10/03 1024) SpO2:  [94 %-100 %] 100 % (10/03 1024) Weight:  [268 lb 15.4 oz (122 kg)-270 lb 11.6 oz (122.8 kg)] 268 lb 15.4 oz (122 kg) (10/03 0500) Last BM Date: 05/16/13  Intake/Output from previous day: 10/02 0701 - 10/03 0700 In: 80 [I.V.:80] Out: 4754 [Urine:3700; Drains:1050; Stool:4] Intake/Output this shift: Total I/O In: 240 [P.O.:240] Out: 1050 [Urine:750; Drains:300]  PE: afeb; vss Hg 7.5; Hc 22.1- stable Rt leg with wound vacs intact Working well now- needed tube change per chart Left leg 2+ pulses NT to touch foot Swelling unchanged Posterior leg at site- tender; no hematoma noted No redness; minimal ecchymotic area directly at lysis site Can move toes and sensation is good Painful to move leg by himself per pt  Lab Results:   Recent Labs  05/15/13 1307 05/16/13 0350  WBC 6.6 6.2  HGB 7.3* 7.5*  HCT 21.3* 22.1*  PLT 140* 157   BMET  Recent Labs  05/14/13 0107  NA 138  K 4.3  CL 107  CO2 23  GLUCOSE 117*  BUN 31*  CREATININE 1.38*  CALCIUM 7.5*   PT/INR  Recent Labs  05/14/13 1020  LABPROT 15.0  INR 1.21   ABG No results found for this basename: PHART, PCO2, PO2, HCO3,  in the last 72 hours  Studies/Results: No results found.  Anti-infectives: Anti-infectives   Start     Dose/Rate Route Frequency Ordered Stop   05/16/13 1400  metroNIDAZOLE (FLAGYL) tablet 500 mg     500 mg Oral 3 times per day 05/16/13 1128 05/30/13 1359   05/14/13 1145  ceFAZolin (ANCEF) IVPB 2 g/50  mL premix     2 g 100 mL/hr over 30 Minutes Intravenous To Surgery 05/14/13 1141 05/14/13 1145   05/03/13 1600  piperacillin-tazobactam (ZOSYN) IVPB 3.375 g     3.375 g 12.5 mL/hr over 240 Minutes Intravenous 3 times per day 05/03/13 1222 05/04/13 0213   05/03/13 1000  vancomycin (VANCOCIN) 1,500 mg in sodium chloride 0.9 % 500 mL IVPB  Status:  Discontinued     1,500 mg 250 mL/hr over 120 Minutes Intravenous Every 48 hours 05/01/13 1105 05/01/13 1403   05/01/13 1000  vancomycin (VANCOCIN) 1,500 mg in sodium chloride 0.9 % 500 mL IVPB  Status:  Discontinued     1,500 mg 250 mL/hr over 120 Minutes Intravenous Every 24 hours 05/01/13 0105 05/01/13 1105   04/30/13 1600  piperacillin-tazobactam (ZOSYN) IVPB 2.25 g  Status:  Discontinued     2.25 g 100 mL/hr over 30 Minutes Intravenous Every 8 hours 04/30/13 1342 05/03/13 1222   04/30/13 1200  vancomycin (VANCOCIN) IVPB 1000 mg/200 mL premix  Status:  Discontinued     1,000 mg 200 mL/hr over 60 Minutes Intravenous Every 12 hours 04/30/13 0245 04/30/13 1326   04/30/13 0800  piperacillin-tazobactam (ZOSYN) IVPB 3.375 g  Status:  Discontinued     3.375 g 12.5 mL/hr over 240 Minutes Intravenous Every 8 hours 04/30/13 0245 04/30/13 1326  04/30/13 0300  vancomycin (VANCOCIN) 2,000 mg in sodium chloride 0.9 % 500 mL IVPB     2,000 mg 250 mL/hr over 120 Minutes Intravenous  Once 04/30/13 0245 04/30/13 0510   04/30/13 0300  piperacillin-tazobactam (ZOSYN) IVPB 3.375 g     3.375 g 100 mL/hr over 30 Minutes Intravenous  Once 04/30/13 0245 04/30/13 0340      Assessment/Plan: s/p Procedure(s): FASCIOTOMY with wound vac placement (Right) APPLICATION OF WOUND VAC (Right)  B LE dvt/IVC- lysis 9/28-29 Onset R posterior leg hematoma/compartment syndrome 10/1 Fasciotomy with Dr Imogene Burn L leg soreness today Will report to Dr Archer Asa   LOS: 16 days    Taejah Ohalloran A 05/16/2013

## 2013-05-17 ENCOUNTER — Encounter (HOSPITAL_COMMUNITY)
Admission: AD | Disposition: A | Discharge status: Skilled Nursing Facility | Payer: Self-pay | Source: Other Acute Inpatient Hospital | Attending: Internal Medicine

## 2013-05-17 ENCOUNTER — Encounter (HOSPITAL_COMMUNITY): Payer: Self-pay | Admitting: Anesthesiology

## 2013-05-17 ENCOUNTER — Inpatient Hospital Stay (HOSPITAL_COMMUNITY): Payer: PRIVATE HEALTH INSURANCE | Admitting: Anesthesiology

## 2013-05-17 DIAGNOSIS — IMO0002 Reserved for concepts with insufficient information to code with codable children: Secondary | ICD-10-CM

## 2013-05-17 DIAGNOSIS — M79A29 Nontraumatic compartment syndrome of unspecified lower extremity: Secondary | ICD-10-CM

## 2013-05-17 HISTORY — PX: FASCIOTOMY: SHX132

## 2013-05-17 LAB — CBC
HCT: 16.6 % — ABNORMAL LOW (ref 39.0–52.0)
HCT: 29.5 % — ABNORMAL LOW (ref 39.0–52.0)
Hemoglobin: 6.6 g/dL — CL (ref 13.0–17.0)
Hemoglobin: 5.8 g/dL — CL (ref 13.0–17.0)
Hemoglobin: 10.1 g/dL — ABNORMAL LOW (ref 13.0–17.0)
MCH: 29.6 pg (ref 26.0–34.0)
MCH: 29.2 pg (ref 26.0–34.0)
MCV: 85.1 fL (ref 78.0–100.0)
MCV: 85.3 fL (ref 78.0–100.0)
Platelets: 188 10*3/uL (ref 150–400)
Platelets: 166 10*3/uL (ref 150–400)
RBC: 2.23 MIL/uL — ABNORMAL LOW (ref 4.22–5.81)
RBC: 1.95 MIL/uL — ABNORMAL LOW (ref 4.22–5.81)
RBC: 3.46 MIL/uL — ABNORMAL LOW (ref 4.22–5.81)
RDW: 14.7 % (ref 11.5–15.5)
WBC: 9.1 10*3/uL (ref 4.0–10.5)
WBC: 9.6 10*3/uL (ref 4.0–10.5)
WBC: 13.9 10*3/uL — ABNORMAL HIGH (ref 4.0–10.5)

## 2013-05-17 LAB — RETICULOCYTES
RBC.: 3.46 MIL/uL — ABNORMAL LOW (ref 4.22–5.81)
Retic Count, Absolute: 218 10*3/uL — ABNORMAL HIGH (ref 19.0–186.0)
Retic Ct Pct: 6.3 % — ABNORMAL HIGH (ref 0.4–3.1)

## 2013-05-17 LAB — MYOGLOBIN, SERUM: Myoglobin: 2620 ng/mL — ABNORMAL HIGH (ref <111)

## 2013-05-17 LAB — DIC (DISSEMINATED INTRAVASCULAR COAGULATION)PANEL
INR: 1.37 (ref 0.00–1.49)
Prothrombin Time: 16.5 seconds — ABNORMAL HIGH (ref 11.6–15.2)

## 2013-05-17 LAB — PREPARE RBC (CROSSMATCH)

## 2013-05-17 LAB — FIBRINOGEN: Fibrinogen: 429 mg/dL (ref 204–475)

## 2013-05-17 LAB — HEPARIN LEVEL (UNFRACTIONATED): Heparin Unfractionated: 0.37 IU/mL (ref 0.30–0.70)

## 2013-05-17 SURGERY — FASCIOTOMY, UPPER EXTREMITY
Anesthesia: General | Laterality: Left | Wound class: Clean

## 2013-05-17 MED ORDER — HYDROMORPHONE HCL PF 1 MG/ML IJ SOLN
0.2500 mg | INTRAMUSCULAR | Status: DC | PRN
  Administered 2013-05-17 (×2): 0.5 mg via INTRAVENOUS

## 2013-05-17 MED ORDER — FENTANYL CITRATE 0.05 MG/ML IJ SOLN
INTRAMUSCULAR | Status: DC | PRN
  Administered 2013-05-17 (×2): 75 ug via INTRAVENOUS

## 2013-05-17 MED ORDER — PROPOFOL 10 MG/ML IV BOLUS
INTRAVENOUS | Status: DC | PRN
  Administered 2013-05-17: 100 mg via INTRAVENOUS

## 2013-05-17 MED ORDER — OXYCODONE HCL 5 MG PO TABS
5.0000 mg | ORAL_TABLET | Freq: Once | ORAL | Status: AC | PRN
  Administered 2013-05-17: 5 mg via ORAL

## 2013-05-17 MED ORDER — CEFAZOLIN SODIUM 1-5 GM-% IV SOLN
INTRAVENOUS | Status: AC
  Filled 2013-05-17: qty 50

## 2013-05-17 MED ORDER — ARTIFICIAL TEARS OP OINT
TOPICAL_OINTMENT | OPHTHALMIC | Status: DC | PRN
  Administered 2013-05-17: 1 via OPHTHALMIC

## 2013-05-17 MED ORDER — SUCCINYLCHOLINE CHLORIDE 20 MG/ML IJ SOLN
INTRAMUSCULAR | Status: DC | PRN
  Administered 2013-05-17: 120 mg via INTRAVENOUS

## 2013-05-17 MED ORDER — HYDROMORPHONE HCL PF 1 MG/ML IJ SOLN
INTRAMUSCULAR | Status: AC
  Filled 2013-05-17: qty 1

## 2013-05-17 MED ORDER — CEFAZOLIN SODIUM-DEXTROSE 2-3 GM-% IV SOLR
INTRAVENOUS | Status: AC
  Administered 2013-05-17: 3 g via INTRAVENOUS
  Filled 2013-05-17: qty 50

## 2013-05-17 MED ORDER — LIDOCAINE HCL (CARDIAC) 20 MG/ML IV SOLN
INTRAVENOUS | Status: DC | PRN
  Administered 2013-05-17: 80 mg via INTRAVENOUS

## 2013-05-17 MED ORDER — PHENYLEPHRINE HCL 10 MG/ML IJ SOLN
10.0000 mg | INTRAVENOUS | Status: DC | PRN
  Administered 2013-05-17: 25 ug/min via INTRAVENOUS

## 2013-05-17 MED ORDER — OXYCODONE HCL 5 MG PO TABS
ORAL_TABLET | ORAL | Status: AC
  Filled 2013-05-17: qty 1

## 2013-05-17 MED ORDER — HEPARIN (PORCINE) IN NACL 100-0.45 UNIT/ML-% IJ SOLN
1900.0000 [IU]/h | INTRAMUSCULAR | Status: DC
  Administered 2013-05-17: 1900 [IU]/h via INTRAVENOUS
  Filled 2013-05-17 (×6): qty 250

## 2013-05-17 MED ORDER — OXYCODONE HCL 5 MG/5ML PO SOLN: 5.0000 mg | Freq: Once | ORAL | Status: AC | PRN

## 2013-05-17 MED ORDER — PROMETHAZINE HCL 25 MG/ML IJ SOLN: 6.2500 mg | INTRAMUSCULAR | Status: DC | PRN

## 2013-05-17 SURGICAL SUPPLY — 30 items
BANDAGE ELASTIC 4 VELCRO ST LF (GAUZE/BANDAGES/DRESSINGS) IMPLANT
BANDAGE ELASTIC 6 VELCRO ST LF (GAUZE/BANDAGES/DRESSINGS) IMPLANT
BANDAGE GAUZE ELAST BULKY 4 IN (GAUZE/BANDAGES/DRESSINGS) IMPLANT
CANISTER SUCTION 2500CC (MISCELLANEOUS) ×2 IMPLANT
CONNECTOR Y ATS VAC SYSTEM (MISCELLANEOUS) ×2 IMPLANT
COVER SURGICAL LIGHT HANDLE (MISCELLANEOUS) ×2 IMPLANT
DRSG VAC ATS LRG SENSATRAC (GAUZE/BANDAGES/DRESSINGS) ×2 IMPLANT
DRSG VAC ATS MED SENSATRAC (GAUZE/BANDAGES/DRESSINGS) ×2 IMPLANT
ELECT REM PT RETURN 9FT ADLT (ELECTROSURGICAL) ×2
ELECTRODE REM PT RTRN 9FT ADLT (ELECTROSURGICAL) ×1 IMPLANT
GLOVE BIO SURGEON STRL SZ7.5 (GLOVE) ×2 IMPLANT
GOWN PREVENTION PLUS XLARGE (GOWN DISPOSABLE) ×2 IMPLANT
GOWN STRL NON-REIN LRG LVL3 (GOWN DISPOSABLE) ×2 IMPLANT
KIT BASIN OR (CUSTOM PROCEDURE TRAY) ×2 IMPLANT
KIT ROOM TURNOVER OR (KITS) ×2 IMPLANT
NS IRRIG 1000ML POUR BTL (IV SOLUTION) ×2 IMPLANT
PACK GENERAL/GYN (CUSTOM PROCEDURE TRAY) ×2 IMPLANT
PACK UNIVERSAL I (CUSTOM PROCEDURE TRAY) ×2 IMPLANT
PAD ARMBOARD 7.5X6 YLW CONV (MISCELLANEOUS) ×4 IMPLANT
PAD NEG PRESSURE SENSATRAC (MISCELLANEOUS) ×4 IMPLANT
SPONGE GAUZE 4X4 12PLY (GAUZE/BANDAGES/DRESSINGS) IMPLANT
STAPLER VISISTAT 35W (STAPLE) IMPLANT
SUT ETHILON 3 0 PS 1 (SUTURE) IMPLANT
SUT VIC AB 2-0 CTX 36 (SUTURE) IMPLANT
SUT VIC AB 3-0 SH 27 (SUTURE)
SUT VIC AB 3-0 SH 27X BRD (SUTURE) IMPLANT
SUT VICRYL 4-0 PS2 18IN ABS (SUTURE) IMPLANT
TOWEL OR 17X24 6PK STRL BLUE (TOWEL DISPOSABLE) ×2 IMPLANT
TOWEL OR 17X26 10 PK STRL BLUE (TOWEL DISPOSABLE) ×2 IMPLANT
WATER STERILE IRR 1000ML POUR (IV SOLUTION) IMPLANT

## 2013-05-17 NOTE — Progress Notes (Addendum)
ANTICOAGULATION CONSULT NOTE - Follow Up Consult  Pharmacy Consult for Heparin Indication: DVT  Allergies  Allergen Reactions  . Contrast Media [Iodinated Diagnostic Agents]     On 04/29/2013 spoke with patient he had some type of procedure at [facility] and broke out in hives after the exams in 1997, he did not know what of type of study he had.  I called [facility] Radiology department the only type of contrast they used during that time  was Conray    Patient Measurements: Height: 6' (182.9 cm) Weight: 264 lb 8.8 oz (120 kg) IBW/kg (Calculated) : 77.6 Heparin Dosing Weight: 104kg  Vital Signs: Temp: 98.2 F (36.8 C) (10/04 0804) Temp src: Oral (10/04 0804) BP: 100/48 mmHg (10/04 0804) Pulse Rate: 109 (10/04 0804)  Labs:  Recent Labs  05/14/13 1020 05/14/13 1456  05/16/13 0350 05/16/13 1415 05/16/13 1957 05/17/13 0050 05/17/13 0700  HGB  --   --   < > 7.5* 6.6*  --  6.6* 5.8*  HCT  --   --   < > 22.1* 18.7*  --  18.7* 16.6*  PLT  --   --   < > 157 187  --  188 166  APTT 41*  --   --   --   --   --   --   --   LABPROT 15.0  --   --   --   --   --   --   --   INR 1.21  --   --   --   --   --   --   --   HEPARINUNFRC  --   --   < > 0.67  --  0.52  --  0.37  CKTOTAL  --  27  --   --   --   --   --   --   < > = values in this interval not displayed.  Estimated Creatinine Clearance: 70.5 ml/min (by C-G formula based on Cr of 1.38).   Medications:  Heparin @ 1900 units/hr  Assessment: 66yom with history of PE s/p IVC filter who developed extensive bilateral DVTs on 9/21 with concern for clot moving through the IVC filter. Heparin initiated. He underwent thrombolytic therapy with [facility] 9/28-9/29. On 10/1 there was concern of developing compartment syndrome in his right leg so he went to the OR for right leg fasciotomy.   He is now POD#3 right leg fasciotomy with wound vac. Continues on heparin with a therapeutic heparin level, however, now the patient c/o  left leg pain>>vascular surgery to see. Also, Hgb has been dropping. Last night received 1 unit PRBC for Hgb 6.6. Hgb this AM was 6.6 and then down to 5.8 - he is to receive another unit.  Goal of Therapy:  Heparin level 0.3-0.7 units/ml, but aim for lower end of goal given low Hgb, possible bleeding in left leg Monitor platelets by anticoagulation protocol: Yes   Plan:  1) Continue heparin at 1900 units/hr for now 2) Follow up CBC 3) Follow up vascular surgery plans re: left leg pain  Fredrik Rigger 05/17/2013,8:21 AM  Patient is now s/p left leg fasciotomy and pharmacy has received orders to resume heparin tonight at 7pm. Will resume at 1900 units/hr since previously therapeutic on this rate. Check 6 hour heparin level after heparin is resumed.  Fredrik Rigger 05/17/2013, 12:48 PM

## 2013-05-17 NOTE — Progress Notes (Signed)
Subjective: Re-eval. Seen by Dr. Grace Isaac.  IVC and B LE dvt- thrombolysis 9/28-9/29  Developed Rt calf hematoma and required Rt calf fasciotomy 10/1  Rt calf has wound vacs x 2- followed by Dr Imogene Burn  Patient transferred to [facility] for drop in hgb 6.6 (7.5), receiving 1 unit PRBC. Patient only c/o is of (L)LE, similar to the pain he had on the right. He denies any CP, Abd or flank/back pain.  Objective: Physical Exam: BP 100/48  Pulse 109  Temp(Src) 98.2 F (36.8 C) (Oral)  Resp 20  Ht 6' (1.829 m)  Wt 264 lb 8.8 oz (120 kg)  BMI 35.87 kg/m2  SpO2 100%   Rt LE- wound vacs intact, serosanguinous output, no gross blood. NT to touch foot  Swelling left LE, less movement in toes. Good sensation and excellent doppler arterial signals. Severe pain with passive dorsiflexion. No redness; minimal ecchymotic area directly at lysis site  Painful and heavy to move leg by himself  Labs: CBC  Recent Labs  05/17/13 0050 05/17/13 0700  WBC 9.1 9.6  HGB 6.6* 5.8*  HCT 18.7* 16.6*  PLT 188 166   BMET No results found for this basename: NA, K, CL, CO2, GLUCOSE, BUN, CREATININE, CALCIUM,  in the last 72 hours LFT No results found for this basename: PROT, ALBUMIN, AST, ALT, ALKPHOS, BILITOT, BILIDIR, IBILI, LIPASE,  in the last 72 hours PT/INR  Recent Labs  05/14/13 1020  LABPROT 15.0  INR 1.21     Studies/Results: Dg Knee Left Port  05/16/2013   CLINICAL DATA:  Pain and swelling  EXAM: PORTABLE LEFT KNEE - 1-2 VIEW  COMPARISON:  None.  FINDINGS: There is no evidence of fracture or dislocation. Trace suprapatellar fluid identified. There is no focal bone abnormality. Soft tissues are remarkable for mild edema diffusely. Tricompartmental degenerative change noted.  IMPRESSION: Degenerative change without acute osseous abnormality.   Electronically Signed   By: Christiana Pellant M.D.   On: 05/16/2013 21:47    Assessment/Plan: s/p Procedure(s):  FASCIOTOMY with wound vac placement (Right)   APPLICATION OF WOUND VAC (Right)  B LE dvt/IVC- lysis 9/28-29  Onset R posterior leg hematoma/compartment syndrome 10/1  Fasciotomy with Dr Imogene Burn  Hgb continues to drop, 5.8 received 1 unit PRBC. Continued (L)LE edema and pain, similar to right leg prior to fasciotomy. No other sources of pain and/or bleeding evident. No new recs from IR at this point. Dr. Grace Isaac is contacting both Primary and VVS services to discuss.   LOS: 17 days    Brayton El PA-C 05/17/2013 8:53 AM

## 2013-05-17 NOTE — Progress Notes (Signed)
hgb was the same after blood transfusion notified Dr Gwenlyn Perking c/ of more pain in the left leg than right worried of bleedin the left lower leg meaured 19 1/4 inches at the widest part.

## 2013-05-17 NOTE — Anesthesia Procedure Notes (Signed)
Procedure Name: Intubation Date/Time: 05/17/2013 10:32 AM Performed by: Tyrone Nine Pre-anesthesia Checklist: Patient identified, Timeout performed, Emergency Drugs available, Suction available and Patient being monitored Patient Re-evaluated:Patient Re-evaluated prior to inductionOxygen Delivery Method: Circle system utilized Preoxygenation: Pre-oxygenation with 100% oxygen Intubation Type: IV induction Laryngoscope Size: Mac and 3 Grade View: Grade I Tube type: Oral Tube size: 7.5 mm Number of attempts: 1 Airway Equipment and Method: Stylet Placement Confirmation: positive ETCO2,  ETT inserted through vocal cords under direct vision and breath sounds checked- equal and bilateral Secured at: 22 cm Tube secured with: Tape Dental Injury: Teeth and Oropharynx as per pre-operative assessment

## 2013-05-17 NOTE — Transfer of Care (Signed)
Immediate Anesthesia Transfer of Care Note  Patient: Jack [name]  Procedure(s) Performed: Procedure(s): FASCIOTOMY LEG (Left)  Patient Location: PACU  Anesthesia Type:General  Level of Consciousness: awake, alert , oriented and patient cooperative  Airway & Oxygen Therapy: Patient Spontanous Breathing and Patient connected to nasal cannula oxygen  Post-op Assessment: Report given to PACU RN and Post -op Vital signs reviewed and stable  Post vital signs: Reviewed and stable  Complications: No apparent anesthesia complications

## 2013-05-17 NOTE — Op Note (Signed)
Procedure: Left leg 4 compartment fasciotomy  Preoperative diagnosis: Compartment syndrome left leg  Postoperative diagnosis: Same  Operative findings: Large amount of clear fluid under pressure posterior deep compartment left leg, viable muscle tissue all 4 compartments  Anesthesia: Gen.  Assistant: Nurse  Operative details: After pain informed consent, the patient was taken the operating room. The patient was placed in supine position on the operating room table. The patient's left lower extremity was prepped and draped in usual sterile fashion. A longitudinal incision was made on the medial aspect of the left leg from just below the knee to the midcalf. The incision was carried through the subcutaneous tissues. There was bleeding from branches of the saphenous vein and the patient appeared to have venous hypertension. The posterior deep compartment was entered and a large amount of clear yellow fluid was evacuated. The fluid was under significant pressure. The muscle all appeared viable. The posterior deep and superficial compartments were completely decompressed by incising the fascia with cautery. Attention was then turned to the lateral aspect the left leg. A longitudinal incision was made two thirds of the way between the tibia and fibula. Incision was carried through the subcutaneous tissues down to level of fascia. The fascia of the anterior and lateral compartments was incised. This was incised for the full length incision to make sure that there is good muscle decompression. The muscle was edematous. There was some blood staining of the lateral compartment but not the anterior compartment. The muscle all appeared viable. At this point a VAC dressing was applied to the lateral and medial compartments. The patient tolerated the procedure well and there were no complications. Instrument sponge and needle count was correct in the case. The patient was extubated in the operating room and taken to  the recovery room in stable condition.

## 2013-05-17 NOTE — Anesthesia Postprocedure Evaluation (Signed)
  Anesthesia Post-op Note  Patient: Jack [name]  Procedure(s) Performed: Procedure(s): FASCIOTOMY LEG (Left)  Patient Location: PACU  Anesthesia Type:General  Level of Consciousness: awake and alert   Airway and Oxygen Therapy: Patient Spontanous Breathing  Post-op Pain: mild  Post-op Assessment: Post-op Vital signs reviewed  Post-op Vital Signs: stable  Complications: No apparent anesthesia complications

## 2013-05-17 NOTE — Anesthesia Preprocedure Evaluation (Addendum)
Anesthesia Evaluation  Patient identified by MRN, date of birth, ID band Patient awake    Airway Mallampati: II  Neck ROM: Full    Dental  (+) Edentulous Upper and Edentulous Lower   Pulmonary shortness of breath, asthma ,  breath sounds clear to auscultation  Pulmonary exam normal       Cardiovascular hypertension, Pt. on medications + Peripheral Vascular Disease Rhythm:Regular Rate:Tachycardia  04-30-13 ECHO   - Left ventricle: Study is severely limited. The cavity size   was normal. The estimated ejection fraction was 65%.   Images were inadequate for LV wall motion assessment. - Left atrium: The atrium was mildly dilated. - Right ventricle: RV may be OK. Not able to assess RV   function due to poor visualization. Not able to assess RV   size due to poor visualization   Neuro/Psych    GI/Hepatic   Endo/Other    Renal/GU ESRFRenal disease     Musculoskeletal   Abdominal Normal abdominal exam  (+) + obese,   Peds  Hematology  (+) anemia ,   Anesthesia Other Findings   Reproductive/Obstetrics                         Anesthesia Physical Anesthesia Plan  ASA: III and emergent  Anesthesia Plan: General ETT   Post-op Pain Management:    Induction: Intravenous  Airway Management Planned: Oral ETT  Additional Equipment:   Intra-op Plan:   Post-operative Plan: Extubation in OR  Informed Consent: I have reviewed the patients History and Physical, chart, labs and discussed the procedure including the risks, benefits and alternatives for the proposed anesthesia with the patient or authorized representative who has indicated his/her understanding and acceptance.     Plan Discussed with: Surgeon and CRNA  Anesthesia Plan Comments:         Anesthesia Quick Evaluation

## 2013-05-17 NOTE — Progress Notes (Addendum)
[facility] PROGRESS NOTE  Jack [name] UUV:[id number] DOB: Jan 15, 1946 DOA: 04/30/2013 PCP: Simone Curia, MD  67 y.o. male with complicated medical history including a recent stay at [facility] for PE S/P TURP who required subsequent bilateral renal stents for obstruction due to hemorrhagic complications of anticoagulation s/p eventual IVC filter. Patient was seen in the ED at [facility] 9/17 where his work up demonstrated creatinine of 6.0, WBC of 37k, potassium of 5.8, sodium 127, tachycardia, and mild hypotension. VQ scan was repeated which was negative for PE. He was given kayexelate in the ED, 2L NS, and transferred to [facility].  8/29 - TURP w/ L ureteral stent [facility]  9/02 - admit [facility] w/ V/Q "high probability" for PE >> anticoag >> severe hematuria + anemia + renal failure  9/3 - CT abdom reveals large L renal capsular hematoma  9/5 - transferred to [facility] to PCCM service - noted to have large clot in bladder w/ B hydronephrosis  9/5 - IVC filter placement  9/6 - B ureteral stents placed - open evacuation of clot from bladder- suprapubic tube placed  9/7- venous duplex lower extremity - NO DVT  9/21-venous duplex lower extremity- Bilateral: Positive for DVT in the common femoral, profunda, femoral, popliteal, and posterior tibial veins with superficial thrombus in the greater and lesser saphenous veins.  9/22 - IV heparin initiated for DVTs (anticoagulation had been on hold due to renal hematoma)  9/28: Decision was made to perform a catheter directed lysis of clot and patient was transferred to the ICU for monitoring and further care while lytics are on. 9/28-Bilateral popliteal venous acc with initiation of bilateral LE and IVC thrombolysis, popliteal sheaths  9/29- IR inferior venacavagram, thrombolysis/thrombectomy bilateral lower extremities showing improved patency, IVC patent. Advised d/c lytic therapy, continue heparin per pharmacy  9/30 - CT abd/pelvis showed  stable L renal hematoma  10/1- Acute hematoma rt leg below knee, compartment syndrome concerns 10/1- calf fasciotomy performed 10/6- OR Monday to close lateral incision       Assessment/Plan: c-diff colitis -start flagyl 10/3  Left leg pain -?bleeding vs compartment syndrome -spoke with IR who is calling vascular  Acute renal failure/ CKD 3  crt was 1.85 at time of d/c 9/10 - Nephrology has now signed off - ultrasound and Ct abd/pelvis reviewed and there are no acute findings - renal function improving continuously - most likely simple pre-renal state v/s ATN related to hypotension   Bilateral lower extremity DVT / PE  status post IVC filter 9/5 - given recent severe complications related to bleeding anticoagulation was not felt to be wise at the time of admission - further review of records revealed Dopplers from 9/7 which were negative for DVT - repeat dopplers this hospitalization confirmed extensive bilateral lower extremity DVTs - d/c'ed testosterone replacement - due to fear of propagation of clot through IVC filter - Will have thrombolysis  by IR - was in ICU - then had ?compartment syndrome and was taken to OR on 10/1- wound vac placed and then to be taken back to OR on 10/6  Progressive anemia  Likely a combination of significant recent acute loss plus decreased production -  -transfused 2 units- will do 1 more as hgb not raising -  Hemorrhoidal GI bleed  Bleeding started 9/25  -bright red blood streaking in stools- appears to be from hemorrhoids - he has visible external hemorrhoids- no pain  - started treatment for hemorrhoids. - no further GI bleeding since 10/3-  mucous stools   Leukocytosis  WBC now normalized - completed a five-day course of empiric antibiotic therapy   Hyperkalemia >> hypokalemia  Due to above - replaced and now normalized   S/p transurethral resection of prostate with left ureteroscopic stone manipulation 04/13/13 complicated by perinephric  hematoma with bilateral hydronephrosis  underwent urgent cystoscopy 04/18/2013 with bilateral stent placement and bladder evacuation with suprapubic tube placement - Urology has seen in f/u -has some blood in urine (on heparin)   Hypotension - resolved  BP stable at this time - felt to be due to hypovolemia    Code Status: full Family Communication: patient Disposition Plan:    Consultants:  Nephrology  Vascular  IR  urology  LINES / TUBES:  PIV  9/28 IR placed bilateral sheaths for lytic drip  Suprapubic catheter  CULTURES:  9/17 Blood>>>NTD  9/28 MRSA>> Neg   ANTIBIOTICS:  Ancef 10/1 >>> single dose for Fasciotomy  HPI/Subjective: Left leg increasing pain   Objective: Filed Vitals:   05/17/13 0804  BP: 100/48  Pulse: 109  Temp: 98.2 F (36.8 C)  Resp: 20    Intake/Output Summary (Last 24 hours) at 05/17/13 0910 Last data filed at 05/17/13 0700  Gross per 24 hour  Intake   1935 ml  Output   1709 ml  Net    226 ml   Filed Weights   05/15/13 2244 05/16/13 0500 05/17/13 0430  Weight: 122.8 kg (270 lb 11.6 oz) 122 kg (268 lb 15.4 oz) 120 kg (264 lb 8.8 oz)    Exam:   General:  Pleasant/alert  Cardiovascular: rrr  Respiratory: clear anterior  Abdomen: +BS, obese  Musculoskeletal: right leg with wound vacs; left leg swelling   Data Reviewed: Basic Metabolic Panel:  Recent Labs Lab 05/12/13 0545 05/13/13 0355 05/14/13 0107  NA 138 137 138  K 4.1 4.2 4.3  CL 105 107 107  CO2 24 22 23   GLUCOSE 100* 109* 117*  BUN 38* 36* 31*  CREATININE 1.82* 1.66* 1.38*  CALCIUM 8.4 7.8* 7.5*  MG 1.8  --   --   PHOS 3.7  --   --    Liver Function Tests: No results found for this basename: AST, ALT, ALKPHOS, BILITOT, PROT, ALBUMIN,  in the last 168 hours No results found for this basename: LIPASE, AMYLASE,  in the last 168 hours No results found for this basename: AMMONIA,  in the last 168 hours CBC:  Recent Labs Lab 05/15/13 1307  05/16/13 0350 05/16/13 1415 05/17/13 0050 05/17/13 0700  WBC 6.6 6.2 7.3 9.1 9.6  HGB 7.3* 7.5* 6.6* 6.6* 5.8*  HCT 21.3* 22.1* 18.7* 18.7* 16.6*  MCV 86.9 86.0 85.4 83.9 85.1  PLT 140* 157 187 188 166   Cardiac Enzymes:  Recent Labs Lab 05/14/13 1456  CKTOTAL 27   BNP (last 3 results)  Recent Labs  04/18/13 1245 04/27/13 1240  PROBNP 1762.0* 1260.0*   CBG:  Recent Labs Lab 05/11/13 1941  GLUCAP 103*    Recent Results (from the past 240 hour(s))  MRSA PCR SCREENING     Status: None   Collection Time    05/11/13  6:17 PM      Result Value Range Status   MRSA by PCR NEGATIVE  NEGATIVE Final   Comment:            The GeneXpert MRSA Assay (FDA     approved for NASAL specimens     only), is one component of a  comprehensive MRSA colonization     surveillance program. It is not     intended to diagnose MRSA     infection nor to guide or     monitor treatment for     MRSA infections.  CLOSTRIDIUM DIFFICILE BY PCR     Status: Abnormal   Collection Time    05/16/13  2:34 AM      Result Value Range Status   C difficile by pcr POSITIVE (*) NEGATIVE Final   Comment: CRITICAL RESULT CALLED TO, READ BACK BY AND VERIFIED WITH:     Pennelope Bracken 05/16/13 1120 BY K SCHULTZ  MRSA PCR SCREENING     Status: None   Collection Time    05/16/13  3:26 PM      Result Value Range Status   MRSA by PCR NEGATIVE  NEGATIVE Final   Comment:            The GeneXpert MRSA Assay (FDA     approved for NASAL specimens     only), is one component of a     comprehensive MRSA colonization     surveillance program. It is not     intended to diagnose MRSA     infection nor to guide or     monitor treatment for     MRSA infections.     Studies: Dg Knee Left Port  05/16/2013   CLINICAL DATA:  Pain and swelling  EXAM: PORTABLE LEFT KNEE - 1-2 VIEW  COMPARISON:  None.  FINDINGS: There is no evidence of fracture or dislocation. Trace suprapatellar fluid identified. There is no focal bone  abnormality. Soft tissues are remarkable for mild edema diffusely. Tricompartmental degenerative change noted.  IMPRESSION: Degenerative change without acute osseous abnormality.   Electronically Signed   By: Christiana Pellant M.D.   On: 05/16/2013 21:47    Scheduled Meds: . furosemide  20 mg Intravenous Q12H  . hydrocortisone   Rectal BID  . metroNIDAZOLE  500 mg Oral Q8H  . sodium chloride  3 mL Intravenous Q12H  . sodium chloride  3 mL Intravenous Q12H   Continuous Infusions: . sodium chloride 10 mL/hr at 05/13/13 1400  . heparin 1,900 Units/hr (05/16/13 2326)    Principal Problem:   Acute renal failure Active Problems:   Chronic renal insufficiency   Sepsis   Hyperkalemia   DVT, bilateral lower limbs   GI bleed   Pulmonary embolism 9/2    Time spent: 35    [facility], Dahlia Nifong  [facility] Pager [phone]. If 7PM-7AM, please contact night-coverage at [email or web address], password [facility] 05/17/2013, 9:10 AM  LOS: 17 days

## 2013-05-17 NOTE — Progress Notes (Signed)
Agree with above.  Pt remains on heparin drip.  Appreciate vascular surgery's involvement and management of R LE fasciotomy.  L LE very tense, though remains neurologically intact with doplerable DP and PT pulses.  Concern for potential developing compartment syndrome within the contralateral left LE discussed with Dr. Darrick Penna (Vascular surgery).    At this point, no additional source of bleeding readily apparent -> specifically, no flank pain to suggest interval enlargement of known L sided peri-renal hematoma demonstrated on non-con CT performed 9/30.      Will continue to follow.

## 2013-05-17 NOTE — Progress Notes (Signed)
[facility]  Subjective  - left leg hurts   Objective 88/56 107 97.8 F (36.6 C) (Oral) 20 100%  Intake/Output Summary (Last 24 hours) at 05/17/13 4696 Last data filed at 05/17/13 0700  Gross per 24 hour  Intake   1935 ml  Output   1709 ml  Net    226 ml   Right leg VAC in place foot warm, able to move toes minimal ankle dorsiflexion Left leg calf tense tender to palpation subjectively worse over last few hours per pt, able to dorsiflex first toe and foot at ankle level, foot warm, doppler signals both feet, edematous  Abdomen soft non tender, suprapubic tube dark tea colored urine with sediment    Assessment/Planning: Complicated patient s/p thrombolysis for occluded IVC filter recent right leg fasciotomy now with new symptoms left leg 1.  Duplex both legs to check for persistant DVT ? Has the filter reoccluded? 2. Left leg fasciotomy today 3. Anemia ? Blood loss vs hemolysis vs DIC will send DIC panel, continue transfusion, doubt his anemia can be explained by leg alone may need repeat CT abdomen, ?how much are his GI blood losses according to medical service has had some blood streaked stools.  Reluctant to stop heparin but anemia is obviously a problem and also he may have now bled into his calf or retroperitoneum  4.  Dark urine possibly myoglobinuria need to keep hydrated with IV fluid will send repeat CPK, urine and serum myoglobin  5.  Renal dysfunction no serum creatinine over last 72 hours needs repeat BMET if creatinine reasonable may consider repeat CT scan   Pt informed of above findings as well as risk benefit complications of fasciotomy.  Will hold heparin for now peri procedure.  Order foot pumps to discourage further DVT creation.  FIELDS,CHARLES E 05/17/2013 9:37 AM --  Laboratory Lab Results:  Recent Labs  05/17/13 0050 05/17/13 0700  WBC 9.1 9.6  HGB 6.6* 5.8*  HCT 18.7* 16.6*  PLT 188 166   BMET No results  found for this basename: NA, K, CL, CO2, GLUCOSE, BUN, CREATININE, CALCIUM,  in the last 72 hours  COAG Lab Results  Component Value Date   INR 1.21 05/14/2013   INR 1.13 05/11/2013   INR 1.28 05/05/2013   No results found for this basename: PTT

## 2013-05-17 NOTE — Progress Notes (Signed)
Increasing pain in the left leg clled Dr Burnadette Peter back.

## 2013-05-17 NOTE — Preoperative (Signed)
Beta Blockers   Reason not to administer Beta Blockers:Not Applicable 

## 2013-05-18 LAB — TYPE AND SCREEN
Unit division: 0
Unit division: 0
Unit division: 0
Unit division: 0
Unit division: 0
Unit division: 0
Unit division: 0
Unit division: 0

## 2013-05-18 LAB — BASIC METABOLIC PANEL
BUN: 45 mg/dL — ABNORMAL HIGH (ref 6–23)
BUN: 42 mg/dL — ABNORMAL HIGH (ref 6–23)
CO2: 23 mEq/L (ref 19–32)
Calcium: 7 mg/dL — ABNORMAL LOW (ref 8.4–10.5)
Chloride: 97 mEq/L (ref 96–112)
Chloride: 93 mEq/L — ABNORMAL LOW (ref 96–112)
Creatinine, Ser: 2.12 mg/dL — ABNORMAL HIGH (ref 0.50–1.35)
GFR calc non Af Amer: 41 mL/min — ABNORMAL LOW (ref >90)
Glucose, Bld: 126 mg/dL — ABNORMAL HIGH (ref 70–99)
Glucose, Bld: 138 mg/dL — ABNORMAL HIGH (ref 70–99)

## 2013-05-18 LAB — CBC
HCT: 25.1 % — ABNORMAL LOW (ref 39.0–52.0)
Hemoglobin: 9.5 g/dL — ABNORMAL LOW (ref 13.0–17.0)
Hemoglobin: 9.3 g/dL — ABNORMAL LOW (ref 13.0–17.0)
MCH: 28.7 pg (ref 26.0–34.0)
MCH: 29.1 pg (ref 26.0–34.0)
MCHC: 34.7 g/dL (ref 30.0–36.0)
MCHC: 34.8 g/dL (ref 30.0–36.0)
MCHC: 33.9 g/dL (ref 30.0–36.0)
MCV: 82.8 fL (ref 78.0–100.0)
MCV: 83.4 fL (ref 78.0–100.0)
MCV: 84.2 fL (ref 78.0–100.0)
Platelets: 215 10*3/uL (ref 150–400)
RBC: 3.31 MIL/uL — ABNORMAL LOW (ref 4.22–5.81)
RBC: 3.2 MIL/uL — ABNORMAL LOW (ref 4.22–5.81)
RDW: 14.9 % (ref 11.5–15.5)
RDW: 15.1 % (ref 11.5–15.5)
RDW: 15.1 % (ref 11.5–15.5)

## 2013-05-18 LAB — HEPARIN LEVEL (UNFRACTIONATED): Heparin Unfractionated: 0.29 IU/mL — ABNORMAL LOW (ref 0.30–0.70)

## 2013-05-18 MED ORDER — SODIUM CHLORIDE 0.9 % IV SOLN
INTRAVENOUS | Status: AC
  Administered 2013-05-18: 09:00:00 via INTRAVENOUS

## 2013-05-18 MED ORDER — HEPARIN (PORCINE) IN NACL 100-0.45 UNIT/ML-% IJ SOLN
2300.0000 [IU]/h | INTRAMUSCULAR | Status: DC
  Administered 2013-05-18: 2100 [IU]/h via INTRAVENOUS
  Filled 2013-05-18 (×3): qty 250

## 2013-05-18 NOTE — Progress Notes (Signed)
[facility] PROGRESS NOTE  Jack [name] ZOX:[id number] DOB: [birth year] DOA: 04/30/2013 PCP: Simone Curia, MD  68 y.o. male with complicated medical history including a recent stay at [facility] for PE S/P TURP who required subsequent bilateral renal stents for obstruction due to hemorrhagic complications of anticoagulation s/p eventual IVC filter. Patient was seen in the ED at [facility] 9/17 where his work up demonstrated creatinine of 6.0, WBC of 37k, potassium of 5.8, sodium 127, tachycardia, and mild hypotension. VQ scan was repeated which was negative for PE. He was given kayexelate in the ED, 2L NS, and transferred to [facility].  8/29 - TURP w/ L ureteral stent [facility]  9/02 - admit [facility] w/ V/Q "high probability" for PE >> anticoag >> severe hematuria + anemia + renal failure  9/3 - CT abdom reveals large L renal capsular hematoma  9/5 - transferred to [facility] to PCCM service - noted to have large clot in bladder w/ B hydronephrosis  9/5 - IVC filter placement  9/6 - B ureteral stents placed - open evacuation of clot from bladder- suprapubic tube placed  9/7- venous duplex lower extremity - NO DVT  9/21-venous duplex lower extremity- Bilateral: Positive for DVT in the common femoral, profunda, femoral, popliteal, and posterior tibial veins with superficial thrombus in the greater and lesser saphenous veins.  9/22 - IV heparin initiated for DVTs (anticoagulation had been on hold due to renal hematoma)  9/28: Decision was made to perform a catheter directed lysis of clot and patient was transferred to the ICU for monitoring and further care while lytics are on. 9/28-Bilateral popliteal venous acc with initiation of bilateral LE and IVC thrombolysis, popliteal sheaths  9/29- IR inferior venacavagram, thrombolysis/thrombectomy bilateral lower extremities showing improved patency, IVC patent. Advised d/c lytic therapy, continue heparin per pharmacy  9/30 - CT abd/pelvis showed  stable L renal hematoma  10/1- Acute hematoma rt leg below knee, compartment syndrome concerns 10/1- calf fasciotomy performed 10/4 LEFT calf fasciotomy performed     Assessment/Plan: c-diff colitis -start flagyl 10/3  Left leg pain -compartment syndrome- went for fasciotomy yesterday via vascular  Acute renal failure/ CKD 3  crt was 1.85 at time of d/c 9/10 - Nephrology has now signed off - ultrasound and Ct abd/pelvis reviewed and there are no acute findings - renal function improving continuously - most likely simple pre-renal state v/s ATN related to hypotension   Bilateral lower extremity DVT / PE  status post IVC filter 9/5 - given recent severe complications related to bleeding anticoagulation was not felt to be wise at the time of admission - further review of records revealed Dopplers from 9/7 which were negative for DVT - repeat dopplers this hospitalization confirmed extensive bilateral lower extremity DVTs - d/c'ed testosterone replacement - due to fear of propagation of clot through IVC filter - had thrombolysis  by IR - was in ICU - then had ?compartment syndrome and was taken to OR on 10/1- wound vac placed and then to be taken back to OR on 10/6 on right leg left leg had fasciotomy on 10/4  Progressive anemia  Likely a combination of significant recent acute loss plus decreased production -  -transfused 2 units- will do more as hgb not raising -stable  Hemorrhoidal GI bleed  Bleeding started 9/25  -bright red blood streaking in stools- appears to be from hemorrhoids - he has visible external hemorrhoids- no pain  - started treatment for hemorrhoids. - no further GI bleeding since 10/3-  mucous stools   Leukocytosis  WBC now normalized - completed a five-day course of empiric antibiotic therapy    S/p transurethral resection of prostate with left ureteroscopic stone manipulation 04/13/13 complicated by perinephric hematoma with bilateral hydronephrosis  underwent  urgent cystoscopy 04/18/2013 with bilateral stent placement and bladder evacuation with suprapubic tube placement - Urology has seen in f/u -has some blood in urine (on heparin)   Hypotension - resolved  BP stable at this time - felt to be due to hypovolemia  - HOLDING LASIX TODAY-need to resume in AM  Hyperkalemia- IVF and recheck  Code Status: full Family Communication: patient Disposition Plan:    Consultants:  Nephrology  Vascular  IR  urology  LINES / TUBES:  PIV  9/28 IR placed bilateral sheaths for lytic drip  Suprapubic catheter  CULTURES:  9/17 Blood>>>NTD  9/28 MRSA>> Neg   ANTIBIOTICS:  Ancef 10/1 >>> single dose for Fasciotomy  HPI/Subjective: Pain better Pressure in legs better   Objective: Filed Vitals:   05/18/13 0736  BP:   Pulse: 96  Temp: 97.4 F (36.3 C)  Resp: 15    Intake/Output Summary (Last 24 hours) at 05/18/13 0750 Last data filed at 05/18/13 0556  Gross per 24 hour  Intake 2413.75 ml  Output   3450 ml  Net -1036.25 ml   Filed Weights   05/16/13 0500 05/17/13 0430 05/18/13 0500  Weight: 122 kg (268 lb 15.4 oz) 120 kg (264 lb 8.8 oz) 123.4 kg (272 lb 0.8 oz)    Exam:   General:  Pleasant/alert  Cardiovascular: rrr  Respiratory: clear anterior  Abdomen: +BS, obese Musculoskeletal: right leg with wound vacs; left leg with vac   Data Reviewed: Basic Metabolic Panel:  Recent Labs Lab 05/12/13 0545 05/13/13 0355 05/14/13 0107 05/18/13 0544  NA 138 137 138 130*  K 4.1 4.2 4.3 5.9*  CL 105 107 107 97  CO2 24 22 23 23   GLUCOSE 100* 109* 117* 126*  BUN 38* 36* 31* 45*  CREATININE 1.82* 1.66* 1.38* 2.12*  CALCIUM 8.4 7.8* 7.5* 7.5*  MG 1.8  --   --   --   PHOS 3.7  --   --   --    Liver Function Tests: No results found for this basename: AST, ALT, ALKPHOS, BILITOT, PROT, ALBUMIN,  in the last 168 hours No results found for this basename: LIPASE, AMYLASE,  in the last 168 hours No results found for this  basename: AMMONIA,  in the last 168 hours CBC:  Recent Labs Lab 05/17/13 0050 05/17/13 0700 05/17/13 1230 05/17/13 1232 05/18/13 0204 05/18/13 0544  WBC 9.1 9.6 13.9*  --  13.0* 11.8*  HGB 6.6* 5.8* 10.1*  --  9.5* 9.3*  HCT 18.7* 16.6* 29.5*  --  27.4* 26.7*  MCV 83.9 85.1 85.3  --  82.8 83.4  PLT 188 166 202 201 210 209   Cardiac Enzymes:  Recent Labs Lab 05/14/13 1456 05/17/13 1230  CKTOTAL 27 373*   BNP (last 3 results)  Recent Labs  04/18/13 1245 04/27/13 1240  PROBNP 1762.0* 1260.0*   CBG:  Recent Labs Lab 05/11/13 1941  GLUCAP 103*    Recent Results (from the past 240 hour(s))  MRSA PCR SCREENING     Status: None   Collection Time    05/11/13  6:17 PM      Result Value Range Status   MRSA by PCR NEGATIVE  NEGATIVE Final   Comment:  The GeneXpert MRSA Assay (FDA     approved for NASAL specimens     only), is one component of a     comprehensive MRSA colonization     surveillance program. It is not     intended to diagnose MRSA     infection nor to guide or     monitor treatment for     MRSA infections.  CLOSTRIDIUM DIFFICILE BY PCR     Status: Abnormal   Collection Time    05/16/13  2:34 AM      Result Value Range Status   C difficile by pcr POSITIVE (*) NEGATIVE Final   Comment: CRITICAL RESULT CALLED TO, READ BACK BY AND VERIFIED WITH:     Pennelope Bracken 05/16/13 1120 BY K SCHULTZ  MRSA PCR SCREENING     Status: None   Collection Time    05/16/13  3:26 PM      Result Value Range Status   MRSA by PCR NEGATIVE  NEGATIVE Final   Comment:            The GeneXpert MRSA Assay (FDA     approved for NASAL specimens     only), is one component of a     comprehensive MRSA colonization     surveillance program. It is not     intended to diagnose MRSA     infection nor to guide or     monitor treatment for     MRSA infections.     Studies: Dg Knee Left Port  05/16/2013   CLINICAL DATA:  Pain and swelling  EXAM: PORTABLE LEFT KNEE -  1-2 VIEW  COMPARISON:  None.  FINDINGS: There is no evidence of fracture or dislocation. Trace suprapatellar fluid identified. There is no focal bone abnormality. Soft tissues are remarkable for mild edema diffusely. Tricompartmental degenerative change noted.  IMPRESSION: Degenerative change without acute osseous abnormality.   Electronically Signed   By: Christiana Pellant M.D.   On: 05/16/2013 21:47    Scheduled Meds: . furosemide  20 mg Intravenous Q12H  . hydrocortisone   Rectal BID  . metroNIDAZOLE  500 mg Oral Q8H  . sodium chloride  3 mL Intravenous Q12H  . sodium chloride  3 mL Intravenous Q12H   Continuous Infusions: . sodium chloride 10 mL/hr at 05/13/13 1400  . heparin 1,900 Units/hr (05/17/13 2000)    Principal Problem:   Acute renal failure Active Problems:   Chronic renal insufficiency   Sepsis   Hyperkalemia   DVT, bilateral lower limbs   GI bleed   Pulmonary embolism 9/2    Time spent: 35    [facility], Draya Felker  [facility] Pager [phone]. If 7PM-7AM, please contact night-coverage at [email or web address], password [facility] 05/18/2013, 7:50 AM  LOS: 18 days

## 2013-05-18 NOTE — Progress Notes (Addendum)
[facility] [place]  Subjective  - legs feel better  Objective 100/38 85 97.4 F (36.3 C) (Oral) 17 98%  Intake/Output Summary (Last 24 hours) at 05/18/13 0921 Last data filed at 05/18/13 0900  Gross per 24 hour  Intake 2355.75 ml  Output   3625 ml  Net -1269.25 ml   Bilateral lower extremity edema, VACs in place feet warm well perfused   Assessment/Planning: 1.  Leg swelling s/p bilat fasciotomies suspect filter may have reoccluded.  Duplex lower extremities today.  Hold on CT with rising creatinine.  Return to OR tomorrow to try to close some of right leg  2. Renal failure: worsening Creatinine 1.3 up to 2.1 over the last several days, hyperkalemia 5.9 today.  Suspect this is from myoglobinuria and muscle breakdown products.  Will increase IVF rate to keep hydrated. Urine more clear this morning.  Urine myoglobin pending.  Renal consult at discretion of primary service, spoke with Dr Benjamine Mola  3. Anemia hemoglobin overall stable this morning  [name],[name] 05/18/2013 9:21 AM --  Laboratory Lab Results:  Recent Labs  05/18/13 0204 05/18/13 0544  WBC 13.0* 11.8*  HGB 9.5* 9.3*  HCT 27.4* 26.7*  PLT 210 209   BMET  Recent Labs  05/18/13 0544  NA 130*  K 5.9*  CL 97  CO2 23  GLUCOSE 126*  BUN 45*  CREATININE 2.12*  CALCIUM 7.5*    COAG Lab Results  Component Value Date   INR 1.37 05/17/2013   INR 1.21 05/14/2013   INR 1.13 05/11/2013   No results found for this basename: PTT

## 2013-05-18 NOTE — Progress Notes (Signed)
VASCULAR LAB PRELIMINARY  PRELIMINARY  PRELIMINARY  PRELIMINARY  Bilateral lower extremity venous Dopplers completed.    Preliminary report:  There is subacute non occlusive DVT noted in the bilateral femoral and left popliteal veins.  There is subacute occlusive DVT noted in the right popliteal and posterior tibial veins.    Darrelle Wiberg, RVT 05/18/2013, 9:43 AM

## 2013-05-18 NOTE — Progress Notes (Signed)
ANTICOAGULATION CONSULT NOTE Pharmacy Consult for Heparin Indication: DVT  Allergies  Allergen Reactions  . Contrast Media [Iodinated Diagnostic Agents]     On 04/29/2013 spoke with patient he had some type of procedure at [facility] and broke out in hives after the exams in 1997, he did not know what of type of study he had.  I called [facility] Radiology department the only type of contrast they used during that time  was Conray    Patient Measurements: Height: 6' (182.9 cm) Weight: 272 lb 0.8 oz (123.4 kg) IBW/kg (Calculated) : 77.6 Heparin Dosing Weight: 104kg  Vital Signs: Temp: 97.6 F (36.4 C) (10/05 0400) Temp src: Oral (10/05 0400) BP: 111/51 mmHg (10/05 0600) Pulse Rate: 95 (10/05 0600)  Labs:  Recent Labs  05/16/13 1957  05/17/13 0700 05/17/13 1230 05/17/13 1232 05/18/13 0204 05/18/13 0544  HGB  --   < > 5.8* 10.1*  --  9.5* 9.3*  HCT  --   < > 16.6* 29.5*  --  27.4* 26.7*  PLT  --   < > 166 202 201 210 209  APTT  --   --   --   --  30  --   --   LABPROT  --   --   --   --  16.5*  --   --   INR  --   --   --   --  1.37  --   --   HEPARINUNFRC 0.52  --  0.37  --   --   --  0.29*  CREATININE  --   --   --   --   --   --  2.12*  CKTOTAL  --   --   --  373*  --   --   --   < > = values in this interval not displayed.  Estimated Creatinine Clearance: 46.5 ml/min (by C-G formula based on Cr of 2.12).  Assessment: 67 yo male with history of PE /DVTs s/p TNKase 9/28-9/29, now s/p repeat fasciotomy, to continue heparin  Goal of Therapy:  Heparin level 0.3-0.7 units/ml Monitor platelets by anticoagulation protocol: Yes   Plan:  Continue Heparin at current rate for now as previously therapeutic at this rate and level may increase further with time Recheck level this afternoon to verify   Lareina Espino, Gary Fleet 05/18/2013,7:04 AM

## 2013-05-18 NOTE — Progress Notes (Addendum)
ANTICOAGULATION CONSULT NOTE - Follow Up Consult  Pharmacy Consult for Heparin Indication: DVT  Allergies  Allergen Reactions  . Contrast Media [Iodinated Diagnostic Agents]     On 04/29/2013 spoke with patient he had some type of procedure at [facility] and broke out in hives after the exams in 1997, he did not know what of type of study he had.  I called [facility] Radiology department the only type of contrast they used during that time  was Conray    Patient Measurements: Height: 6' (182.9 cm) Weight: 272 lb 0.8 oz (123.4 kg) IBW/kg (Calculated) : 77.6 Heparin Dosing Weight: 104kg  Vital Signs: Temp: 98.4 F (36.9 C) (10/05 1117) Temp src: Oral (10/05 1117) BP: 105/41 mmHg (10/05 1400) Pulse Rate: 46 (10/05 1400)  Labs:  Recent Labs  05/16/13 1957  05/17/13 0700 05/17/13 1230 05/17/13 1232 05/18/13 0204 05/18/13 0544 05/18/13 1307  HGB  --   < > 5.8* 10.1*  --  9.5* 9.3* 8.5*  HCT  --   < > 16.6* 29.5*  --  27.4* 26.7* 25.1*  PLT  --   < > 166 202 201 210 209 215  APTT  --   --   --   --  30  --   --   --   LABPROT  --   --   --   --  16.5*  --   --   --   INR  --   --   --   --  1.37  --   --   --   HEPARINUNFRC 0.52  --  0.37  --   --   --  0.29*  --   CREATININE  --   --   --   --   --   --  2.12*  --   CKTOTAL  --   --   --  373*  --   --   --   --   < > = values in this interval not displayed.  Estimated Creatinine Clearance: 46.5 ml/min (by C-G formula based on Cr of 2.12).  Medications:  Heparin @ 1900 units/hr  Assessment: 66yom with history of PE s/p IVC filter who developed extensive bilateral DVTs on 9/21 with concern for clot moving through the IVC filter. Heparin initiated. He underwent thrombolytic therapy with [facility] 9/28-9/29. On 10/1 there was concern of developing compartment syndrome in his right leg so he went to the OR for right leg fasciotomy.   He is now POD#4 right leg fasciotomy with wound vac and POD#1 left leg fasciotomy.  Continues on heparin with a slightly subtherapeutic heparin level. Hgb is low but stable for now.  Goal of Therapy:  Heparin level 0.3-0.7 units/ml Monitor platelets by anticoagulation protocol: Yes   Plan:  1) Increase heparin to 2100 units/hr  2) 6 hour heparin level after rate change 3) To OR tomorrow to try to close some of right leg  Fredrik Rigger 05/18/2013,2:45 PM

## 2013-05-19 ENCOUNTER — Encounter (HOSPITAL_COMMUNITY): Payer: Self-pay | Admitting: Anesthesiology

## 2013-05-19 ENCOUNTER — Inpatient Hospital Stay (HOSPITAL_COMMUNITY): Payer: PRIVATE HEALTH INSURANCE | Admitting: Anesthesiology

## 2013-05-19 ENCOUNTER — Encounter (HOSPITAL_COMMUNITY)
Admission: AD | Disposition: A | Discharge status: Skilled Nursing Facility | Payer: Self-pay | Source: Other Acute Inpatient Hospital | Attending: Internal Medicine

## 2013-05-19 DIAGNOSIS — M79A29 Nontraumatic compartment syndrome of unspecified lower extremity: Secondary | ICD-10-CM

## 2013-05-19 DIAGNOSIS — IMO0002 Reserved for concepts with insufficient information to code with codable children: Secondary | ICD-10-CM

## 2013-05-19 HISTORY — PX: FASCIOTOMY CLOSURE: SHX5829

## 2013-05-19 LAB — CBC
HCT: 22.9 % — ABNORMAL LOW (ref 39.0–52.0)
Hemoglobin: 8.1 g/dL — ABNORMAL LOW (ref 13.0–17.0)
MCH: 28.9 pg (ref 26.0–34.0)
MCH: 29.2 pg (ref 26.0–34.0)
MCHC: 34.2 g/dL (ref 30.0–36.0)
MCHC: 33.6 g/dL (ref 30.0–36.0)
MCV: 84.6 fL (ref 78.0–100.0)
MCV: 86.7 fL (ref 78.0–100.0)
Platelets: 221 10*3/uL (ref 150–400)
RDW: 15.6 % — ABNORMAL HIGH (ref 11.5–15.5)
WBC: 11.5 10*3/uL — ABNORMAL HIGH (ref 4.0–10.5)

## 2013-05-19 LAB — HEPARIN LEVEL (UNFRACTIONATED)
Heparin Unfractionated: 0.28 IU/mL — ABNORMAL LOW (ref 0.30–0.70)
Heparin Unfractionated: 0.36 IU/mL (ref 0.30–0.70)

## 2013-05-19 LAB — BASIC METABOLIC PANEL
BUN: 37 mg/dL — ABNORMAL HIGH (ref 6–23)
CO2: 22 mEq/L (ref 19–32)
Chloride: 96 mEq/L (ref 96–112)
Creatinine, Ser: 1.48 mg/dL — ABNORMAL HIGH (ref 0.50–1.35)
GFR calc Af Amer: 55 mL/min — ABNORMAL LOW (ref >90)
Glucose, Bld: 121 mg/dL — ABNORMAL HIGH (ref 70–99)
Sodium: 129 mEq/L — ABNORMAL LOW (ref 135–145)

## 2013-05-19 LAB — GLUCOSE, CAPILLARY
Glucose-Capillary: 126 mg/dL — ABNORMAL HIGH (ref 70–99)
Glucose-Capillary: 111 mg/dL — ABNORMAL HIGH (ref 70–99)

## 2013-05-19 SURGERY — FASCIOTOMY CLOSURE
Anesthesia: General | Site: Leg Lower | Laterality: Right | Wound class: Clean

## 2013-05-19 MED ORDER — MEPERIDINE HCL 25 MG/ML IJ SOLN: 6.2500 mg | INTRAMUSCULAR | Status: DC | PRN

## 2013-05-19 MED ORDER — CEFAZOLIN SODIUM 1-5 GM-% IV SOLN
INTRAVENOUS | Status: DC | PRN
  Administered 2013-05-19: 1 g via INTRAVENOUS

## 2013-05-19 MED ORDER — 0.9 % SODIUM CHLORIDE (POUR BTL) OPTIME
TOPICAL | Status: DC | PRN
  Administered 2013-05-19: 1000 mL

## 2013-05-19 MED ORDER — SODIUM CHLORIDE 0.9 % IV SOLN: INTRAVENOUS | Status: AC

## 2013-05-19 MED ORDER — OXYCODONE HCL 5 MG/5ML PO SOLN: 5.0000 mg | Freq: Once | ORAL | Status: DC | PRN

## 2013-05-19 MED ORDER — MIDAZOLAM HCL 5 MG/5ML IJ SOLN
INTRAMUSCULAR | Status: DC | PRN
  Administered 2013-05-19: 2 mg via INTRAVENOUS

## 2013-05-19 MED ORDER — CEFAZOLIN SODIUM 1-5 GM-% IV SOLN
INTRAVENOUS | Status: AC
  Filled 2013-05-19: qty 50

## 2013-05-19 MED ORDER — PROMETHAZINE HCL 25 MG/ML IJ SOLN: 6.2500 mg | INTRAMUSCULAR | Status: DC | PRN

## 2013-05-19 MED ORDER — HYDROMORPHONE HCL PF 1 MG/ML IJ SOLN
INTRAMUSCULAR | Status: AC
  Administered 2013-05-19: 0.5 mg via INTRAVENOUS
  Filled 2013-05-19: qty 1

## 2013-05-19 MED ORDER — OXYCODONE HCL 5 MG PO TABS: 5.0000 mg | ORAL_TABLET | Freq: Once | ORAL | Status: DC | PRN

## 2013-05-19 MED ORDER — SODIUM CHLORIDE 0.9 % IV SOLN: INTRAVENOUS | Status: DC

## 2013-05-19 MED ORDER — LIDOCAINE HCL (CARDIAC) 20 MG/ML IV SOLN
INTRAVENOUS | Status: DC | PRN
  Administered 2013-05-19: 100 mg via INTRAVENOUS

## 2013-05-19 MED ORDER — SODIUM CHLORIDE 0.9 % IV SOLN
INTRAVENOUS | Status: DC | PRN
  Administered 2013-05-19 (×2): via INTRAVENOUS

## 2013-05-19 MED ORDER — FENTANYL CITRATE 0.05 MG/ML IJ SOLN
INTRAMUSCULAR | Status: DC | PRN
  Administered 2013-05-19 (×2): 50 ug via INTRAVENOUS
  Administered 2013-05-19: 100 ug via INTRAVENOUS
  Administered 2013-05-19: 50 ug via INTRAVENOUS

## 2013-05-19 MED ORDER — ONDANSETRON HCL 4 MG/2ML IJ SOLN
INTRAMUSCULAR | Status: DC | PRN
  Administered 2013-05-19: 4 mg via INTRAVENOUS

## 2013-05-19 MED ORDER — HYDROMORPHONE HCL PF 1 MG/ML IJ SOLN
0.2500 mg | INTRAMUSCULAR | Status: DC | PRN
  Administered 2013-05-19 (×2): 0.5 mg via INTRAVENOUS

## 2013-05-19 MED ORDER — MIDAZOLAM HCL 2 MG/2ML IJ SOLN: 0.5000 mg | Freq: Once | INTRAMUSCULAR | Status: DC | PRN

## 2013-05-19 MED ORDER — PROPOFOL 10 MG/ML IV BOLUS
INTRAVENOUS | Status: DC | PRN
  Administered 2013-05-19: 150 mg via INTRAVENOUS
  Administered 2013-05-19: 50 mg via INTRAVENOUS

## 2013-05-19 MED ORDER — HEPARIN (PORCINE) IN NACL 100-0.45 UNIT/ML-% IJ SOLN
2200.0000 [IU]/h | INTRAMUSCULAR | Status: DC
  Administered 2013-05-19: 2300 [IU]/h via INTRAVENOUS
  Administered 2013-05-20: 2400 [IU]/h via INTRAVENOUS
  Administered 2013-05-20: 2300 [IU]/h via INTRAVENOUS
  Administered 2013-05-21 (×3): 2400 [IU]/h via INTRAVENOUS
  Filled 2013-05-19 (×13): qty 250

## 2013-05-19 SURGICAL SUPPLY — 38 items
BAG ISOLATION DRAPE 18X18 (DRAPES) ×1 IMPLANT
BANDAGE ELASTIC 4 VELCRO ST LF (GAUZE/BANDAGES/DRESSINGS) IMPLANT
BANDAGE ELASTIC 6 VELCRO ST LF (GAUZE/BANDAGES/DRESSINGS) ×2 IMPLANT
BANDAGE GAUZE ELAST BULKY 4 IN (GAUZE/BANDAGES/DRESSINGS) ×4 IMPLANT
CANISTER SUCTION 2500CC (MISCELLANEOUS) ×2 IMPLANT
CANISTER WOUND CARE 500ML ATS (WOUND CARE) ×4 IMPLANT
COVER SURGICAL LIGHT HANDLE (MISCELLANEOUS) ×2 IMPLANT
DRAPE INCISE IOBAN 66X45 STRL (DRAPES) IMPLANT
DRAPE ISOLATION BAG 18X18 (DRAPES) ×1
DRSG PAD ABDOMINAL 8X10 ST (GAUZE/BANDAGES/DRESSINGS) ×8 IMPLANT
ELECT REM PT RETURN 9FT ADLT (ELECTROSURGICAL) ×2
ELECTRODE REM PT RTRN 9FT ADLT (ELECTROSURGICAL) ×1 IMPLANT
GLOVE BIO SURGEON STRL SZ7 (GLOVE) ×2 IMPLANT
GLOVE BIO SURGEON STRL SZ7.5 (GLOVE) ×2 IMPLANT
GLOVE BIOGEL PI IND STRL 7.0 (GLOVE) ×1 IMPLANT
GLOVE BIOGEL PI IND STRL 7.5 (GLOVE) ×2 IMPLANT
GLOVE BIOGEL PI IND STRL 8 (GLOVE) ×1 IMPLANT
GLOVE BIOGEL PI INDICATOR 7.0 (GLOVE) ×1
GLOVE BIOGEL PI INDICATOR 7.5 (GLOVE) ×2
GLOVE BIOGEL PI INDICATOR 8 (GLOVE) ×1
GOWN STRL NON-REIN LRG LVL3 (GOWN DISPOSABLE) ×6 IMPLANT
KIT BASIN OR (CUSTOM PROCEDURE TRAY) ×2 IMPLANT
KIT ROOM TURNOVER OR (KITS) ×2 IMPLANT
NS IRRIG 1000ML POUR BTL (IV SOLUTION) ×2 IMPLANT
PACK GENERAL/GYN (CUSTOM PROCEDURE TRAY) ×2 IMPLANT
PACK UNIVERSAL I (CUSTOM PROCEDURE TRAY) ×2 IMPLANT
PAD ARMBOARD 7.5X6 YLW CONV (MISCELLANEOUS) ×4 IMPLANT
SPONGE GAUZE 4X4 12PLY (GAUZE/BANDAGES/DRESSINGS) ×4 IMPLANT
STAPLER VISISTAT 35W (STAPLE) ×6 IMPLANT
SUT ETHILON 2 0 PSLX (SUTURE) ×6 IMPLANT
SUT ETHILON 3 0 PS 1 (SUTURE) ×6 IMPLANT
SUT MNCRL AB 4-0 PS2 18 (SUTURE) IMPLANT
SUT VIC AB 2-0 CTX 36 (SUTURE) IMPLANT
SUT VIC AB 3-0 SH 27 (SUTURE)
SUT VIC AB 3-0 SH 27X BRD (SUTURE) IMPLANT
TOWEL OR 17X24 6PK STRL BLUE (TOWEL DISPOSABLE) ×2 IMPLANT
TOWEL OR 17X26 10 PK STRL BLUE (TOWEL DISPOSABLE) ×2 IMPLANT
WATER STERILE IRR 1000ML POUR (IV SOLUTION) ×2 IMPLANT

## 2013-05-19 NOTE — H&P (Signed)
[facility] [place]  History and Physical Update  The patient was interviewed and re-examined.  The patient's previous History and Physical has been reviewed and is unchanged from my consult on: 05/14/13 except for: interval L calf fasciotomy.  There is no change in the plan of care: Closure of R lateral fasciotomy incision, replacement of VAC dressing.  Leonides Sake, MD [facility] [place] Office: [phone] Pager: [phone]  05/19/2013, 7:39 AM

## 2013-05-19 NOTE — Preoperative (Signed)
Beta Blockers   Reason not to administer Beta Blockers:Not Applicable 

## 2013-05-19 NOTE — Progress Notes (Signed)
ANTICOAGULATION CONSULT NOTE - Follow Up Consult  Pharmacy Consult for Heparin Indication: DVT  Allergies  Allergen Reactions  . Contrast Media [Iodinated Diagnostic Agents]     On 04/29/2013 spoke with patient he had some type of procedure at [facility] and broke out in hives after the exams in 1997, he did not know what of type of study he had.  I called [facility] Radiology department the only type of contrast they used during that time  was Conray    Patient Measurements: Height: 6' (182.9 cm) Weight: 272 lb 0.8 oz (123.4 kg) IBW/kg (Calculated) : 77.6 Heparin Dosing Weight: 104kg  Vital Signs: Temp: 98.4 F (36.9 C) (10/05 2300) Temp src: Oral (10/05 2300) BP: 105/47 mmHg (10/05 2200) Pulse Rate: 88 (10/05 2200)  Labs:  Recent Labs  05/17/13 1230 05/17/13 1232 05/18/13 0204 05/18/13 0544 05/18/13 1307 05/18/13 1425 05/18/13 1838 05/18/13 2200  HGB 10.1*  --  9.5* 9.3* 8.5*  --   --   --   HCT 29.5*  --  27.4* 26.7* 25.1*  --   --   --   PLT 202 201 210 209 215  --   --   --   APTT  --  30  --   --   --   --   --   --   LABPROT  --  16.5*  --   --   --   --   --   --   INR  --  1.37  --   --   --   --   --   --   HEPARINUNFRC  --   --   --  0.29*  --  0.27*  --  0.28*  CREATININE  --   --   --  2.12*  --   --  1.69*  --   CKTOTAL 373*  --   --   --   --   --   --   --     Estimated Creatinine Clearance: 58.3 ml/min (by C-G formula based on Cr of 1.69).  Medications:  Heparin @ 2100 units/hr  Assessment: 66yom with history of PE s/p IVC filter who developed extensive bilateral DVTs on 9/21 with concern for clot moving through the IVC filter. Heparin initiated. He underwent thrombolytic therapy with [facility] 9/28-9/29. On 10/1 there was concern of developing compartment syndrome in his right leg so he went to the OR for right leg fasciotomy.   He is now POD#5 right leg fasciotomy with wound vac and POD#2 left leg fasciotomy. Continues on heparin with  a slightly subtherapeutic heparin level. Hgb is low but stable for now.  Goal of Therapy:  Heparin level 0.3-0.7 units/ml Monitor platelets by anticoagulation protocol: Yes   Plan:  1) Increase heparin to 2300 units/hr  2) 6 hour heparin level after rate change 3) To OR today to try to close some of right leg  Jack [name] 05/19/2013,12:54 AM

## 2013-05-19 NOTE — Anesthesia Preprocedure Evaluation (Addendum)
Anesthesia Evaluation  Patient identified by MRN, date of birth, ID band Patient awake    Reviewed: Allergy & Precautions, H&P , NPO status , Patient's Chart, lab work & pertinent test results  History of Anesthesia Complications Negative for: history of anesthetic complications  Airway Mallampati: I TM Distance: >3 FB Neck ROM: Full    Dental  (+) Edentulous Upper and Edentulous Lower   Pulmonary asthma , PE (s/p Greenfield filter) breath sounds clear to auscultation  Pulmonary exam normal       Cardiovascular hypertension, Pt. on medications + Peripheral Vascular Disease and DVT (hypercoagulable, now had Greenfield filter) Rhythm:Regular Rate:Normal  9/14 ECHO: normal LVF, EF 65%, valves OK   Neuro/Psych negative neurological ROS     GI/Hepatic negative GI ROS, Neg liver ROS,   Endo/Other  Morbid obesity  Renal/GU Renal InsufficiencyRenal disease (creat 1.48)     Musculoskeletal   Abdominal (+) + obese,   Peds  Hematology  (+) Blood dyscrasia (Hb 8.1), anemia ,   Anesthesia Other Findings   Reproductive/Obstetrics                          Anesthesia Physical Anesthesia Plan  ASA: III  Anesthesia Plan: General   Post-op Pain Management:    Induction: Intravenous  Airway Management Planned: LMA  Additional Equipment:   Intra-op Plan:   Post-operative Plan:   Informed Consent: I have reviewed the patients History and Physical, chart, labs and discussed the procedure including the risks, benefits and alternatives for the proposed anesthesia with the patient or authorized representative who has indicated his/her understanding and acceptance.   Dental advisory given  Plan Discussed with: Surgeon and CRNA  Anesthesia Plan Comments: (Plan routine monitors, GA- LMA OK)        Anesthesia Quick Evaluation

## 2013-05-19 NOTE — Anesthesia Postprocedure Evaluation (Signed)
  Anesthesia Post-op Note  Patient: Jack [name]  Procedure(s) Performed: Procedure(s): RIGHT LOWER EXTREMITY CLOSURE OF LATERAL AND MEDIAL FASCIOTOMIES  (Right)  Patient Location: PACU  Anesthesia Type:General  Level of Consciousness: awake, alert , oriented and patient cooperative  Airway and Oxygen Therapy: Patient Spontanous Breathing and Patient connected to nasal cannula oxygen  Post-op Pain: mild  Post-op Assessment: Post-op Vital signs reviewed, Patient's Cardiovascular Status Stable, Respiratory Function Stable, Patent Airway, No signs of Nausea or vomiting and Pain level controlled  Post-op Vital Signs: Reviewed and stable  Complications: No apparent anesthesia complications

## 2013-05-19 NOTE — Op Note (Addendum)
OPERATIVE NOTE   PROCEDURE: 1. Closure of right leg fasciotomy incisions (~91 cm)  PRE-OPERATIVE DIAGNOSIS: right leg compartment syndrome  POST-OPERATIVE DIAGNOSIS: same as above   SURGEON: Leonides Sake, MD  ANESTHESIA: general  ESTIMATED BLOOD LOSS: 50 cc  FINDING(S): 1. Viable muscle throughout all compartments based on electrostimulation  SPECIMEN(S):  none  INDICATIONS:   Jack [name] is a 67 y.o. male who presents with right leg compartment syndrome after pharmacomechanical thrombectomy via bilateral popliteal access.  He underwent right leg fasciotomies, followed by left leg fasciotomies several days later.  He returns to OR today to try to close his fasciotomy incisions.  DESCRIPTION: After obtaining full informed written consent, the patient was brought back to the operating room and placed supine upon the operating table.  The patient received IV antibiotics prior to induction.  After obtaining adequate anesthesia, the patient was prepped and draped in the standard fashion for: right lower leg exploration.  I first washed out the betadine from all incisions.  There was no active bleeding from any locations.  I tested muscle contraction in all compartments with the electrocatuery and contraction was present.  I turned my attention first to the lateral incision.  I placed multiple 3-0 Nylon vertical mattress stitches and the reapproximated the skin with staples along the entire incision.  I then turned my attention to the medial incision.  Initially, I was not convinced I could close this incision.  I placed several vertical mattress stitches with 2-0 Nylon to reduce the extent of VAC dressing needed.  However, it became evident after the third stitch that this incision could be closed.  I continued placing 2-0 Nylon vertical mattress stitches and the reapproximated the skin with staples.  Sterile bandages were applied and a Kerlix was wrapped around the right lower leg to keep the  bandages in place.  An ACE bandage was wrapped around the right lower leg to apply some gentle pressure.  The left VAC dressing was left in place.  The patient will go back to the OR on Wednesday to try to close that incision.  COMPLICATIONS: none  CONDITION: stable  Leonides Sake, MD [facility] [name] Office: [phone] Pager: [phone]  05/19/2013, 8:54 AM

## 2013-05-19 NOTE — Progress Notes (Signed)
ANTICOAGULATION CONSULT NOTE - Follow Up Consult  Pharmacy Consult for Heparin Indication: DVT  Allergies  Allergen Reactions  . Contrast Media [Iodinated Diagnostic Agents]     On 04/29/2013 spoke with patient he had some type of procedure at [facility] and broke out in hives after the exams in 1997, he did not know what of type of study he had.  I called [facility] Radiology department the only type of contrast they used during that time  was Conray    Patient Measurements: Height: 6' (182.9 cm) Weight: 266 lb 15.6 oz (121.1 kg) IBW/kg (Calculated) : 77.6 Heparin Dosing Weight: 104kg  Vital Signs: Temp: 98 F (36.7 C) (10/06 1240) Temp src: Oral (10/06 1240) BP: 113/62 mmHg (10/06 1230) Pulse Rate: 88 (10/06 1240)  Labs:  Recent Labs  05/17/13 1230 05/17/13 1232  05/18/13 0544 05/18/13 1307 05/18/13 1425 05/18/13 1838 05/18/13 2200 05/19/13 0230  HGB 10.1*  --   < > 9.3* 8.5*  --   --   --  8.1*  HCT 29.5*  --   < > 26.7* 25.1*  --   --   --  23.7*  PLT 202 201  < > 209 215  --   --   --  221  APTT  --  30  --   --   --   --   --   --   --   LABPROT  --  16.5*  --   --   --   --   --   --   --   INR  --  1.37  --   --   --   --   --   --   --   HEPARINUNFRC  --   --   --  0.29*  --  0.27*  --  0.28*  --   CREATININE  --   --   --  2.12*  --   --  1.69*  --  1.48*  CKTOTAL 373*  --   --   --   --   --   --   --   --   < > = values in this interval not displayed.  Estimated Creatinine Clearance: 66 ml/min (by C-G formula based on Cr of 1.48).  Medications:  Heparin @ 2300 units/hr but off for OR from ~ 0600 until now  Assessment: 67 yo m with history of PE s/p IVC filter who developed extensive bilateral DVTs on 9/21 with concern for clot moving through the IVC filter. Heparin initiated. He underwent thrombolytic therapy with [facility] 9/28-9/29. On 10/1 there was concern of developing compartment syndrome in his right leg so he went to the OR for right  leg fasciotomy.   He is now s/p right leg fasciotomy with wound vac and wound closure today.  He is also s/p left leg fasciotomy with wound vac placement and planned closure 10/8. Heparin was off earlier today for OR.  I communicated with both Drs. Imogene Burn and [place] and ok to restart heparin at this time.    Goal of Therapy:  Heparin level 0.3-0.7 units/ml Monitor platelets by anticoagulation protocol: Yes   Plan:  1) Restart heparin at 2300 units/hr  2) Heparin level in 8 hours.  3) Heparin level and CBC in AM.   Dixie Dials, Pharm.D., BCPS Clinical Pharmacist Pager [phone] 05/19/2013 2:48 PM

## 2013-05-19 NOTE — Progress Notes (Signed)
Subjective: IVC and B LE dvt- thrombolysis 9/28-9/29  Developed Rt calf hematoma and required Rt calf fasciotomy 10/1, incision closure 10/6, dressing and ace bandage applied. Left LE compartment syndrome requiring fasciotomy 10/4, wound vac intact. Patient states feeling better today with much improved movement of his extremities bilaterally. He denies any shortness of breath, chest pain, abdominal, back, or flank pain.   Objective: Physical Exam: BP 113/62  Pulse 88  Temp(Src) 98 F (36.7 C) (Oral)  Resp 18  Ht 6' (1.829 m)  Wt 266 lb 15.6 oz (121.1 kg)  BMI 36.2 kg/m2  SpO2 100%  Abd: Soft, ND, NT Rt LE s/p incision closure 10/6, ace bandage intact Able to move both lower extremities and toes. Sensation intact bilaterally.  Left LE s/p fasciotomy, wound vac intact.  Labs: CBC  Recent Labs  05/18/13 1307 05/19/13 0230  WBC 10.9* 11.5*  HGB 8.5* 8.1*  HCT 25.1* 23.7*  PLT 215 221   BMET  Recent Labs  05/18/13 1838 05/19/13 0230  NA 127* 129*  K 4.5 4.5  CL 93* 96  CO2 23 22  GLUCOSE 138* 121*  BUN 42* 37*  CREATININE 1.69* 1.48*  CALCIUM 7.0* 6.9*   LFT No results found for this basename: PROT, ALBUMIN, AST, ALT, ALKPHOS, BILITOT, BILIDIR, IBILI, LIPASE,  in the last 72 hours PT/INR  Recent Labs  05/17/13 1232  LABPROT 16.5*  INR 1.37     Studies/Results: No results found.  Assessment/Plan: s/p Procedure(s):  FASCIOTOMY with wound vac placement (Right)  APPLICATION OF WOUND VAC (Right)  B LE dvt/IVC- lysis 9/28-29  R posterior leg hematoma/compartment syndrome s/p fasciotomy 10/1  Left leg compartment syndrome s/p fasciotomy 10/4, OR planned Wednesday 10/8 to close incisions.  Bilateral venous doppler 10/5 subacute non occlusive DVT bilateral femoral and left popliteal veins. Subacute occlusive DVT right popliteal and posterior tibial veins.  Right leg fasciotomy incision closure 10/6    LOS: 19 days    Berneta Levins PA-C 05/19/2013  1:12 PM

## 2013-05-19 NOTE — Progress Notes (Addendum)
[facility] PROGRESS NOTE  Jack [name] ZOX:[id number] DOB: [birth year] DOA: 04/30/2013 PCP: Simone Curia, MD  67 y.o. male with complicated medical history including a recent stay at [facility] for PE S/P TURP who required subsequent bilateral renal stents for obstruction due to hemorrhagic complications of anticoagulation s/p eventual IVC filter. Patient was seen in the ED at [facility] 9/17 where his work up demonstrated creatinine of 6.0, WBC of 37k, potassium of 5.8, sodium 127, tachycardia, and mild hypotension. VQ scan was repeated which was negative for PE. He was given kayexelate in the ED, 2L NS, and transferred to [facility].  8/29 - TURP w/ L ureteral stent [facility]  9/02 - admit [facility] w/ V/Q "high probability" for PE >> anticoag >> severe hematuria + anemia + renal failure  9/3 - CT abdom reveals large L renal capsular hematoma  9/5 - transferred to [facility] to PCCM service - noted to have large clot in bladder w/ B hydronephrosis  9/5 - IVC filter placement  9/6 - B ureteral stents placed - open evacuation of clot from bladder- suprapubic tube placed  9/7- venous duplex lower extremity - NO DVT  9/21-venous duplex lower extremity- Bilateral: Positive for DVT in the common femoral, profunda, femoral, popliteal, and posterior tibial veins with superficial thrombus in the greater and lesser saphenous veins.  9/22 - IV heparin initiated for DVTs (anticoagulation had been on hold due to renal hematoma)  9/28: Decision was made to perform a catheter directed lysis of clot and patient was transferred to the ICU for monitoring and further care while lytics are on. 9/28-Bilateral popliteal venous acc with initiation of bilateral LE and IVC thrombolysis, popliteal sheaths  9/29- IR inferior venacavagram, thrombolysis/thrombectomy bilateral lower extremities showing improved patency, IVC patent. Advised d/c lytic therapy, continue heparin per pharmacy  9/30 - CT abd/pelvis showed  stable L renal hematoma  10/1- Acute hematoma rt leg below knee, compartment syndrome concerns 10/1- calf fasciotomy performed 10/4 LEFT calf fasciotomy performed 10/6 taken back to OR to close RIGHT fasciotomy     Assessment/Plan: c-diff colitis -started flagyl 10/3  Left leg pain -compartment syndrome- s/p fasciotomy 10/4  Acute renal failure/ CKD 3  crt was 1.85 at time of d/c 9/10 - Nephrology has now signed off - ultrasound and Ct abd/pelvis reviewed and there are no acute findings - renal function improving continuously - most likely simple pre-renal state v/s ATN related to hypotension   Bilateral lower extremity DVT / PE  status post IVC filter 9/5 - given recent severe complications related to bleeding anticoagulation was not felt to be wise at the time of admission - further review of records revealed Dopplers from 9/7 which were negative for DVT - repeat dopplers this hospitalization confirmed extensive bilateral lower extremity DVTs - d/c'ed testosterone replacement - due to fear of propagation of clot through IVC filter - had thrombolysis  by IR - was in ICU - then had ?compartment syndrome and was taken to OR on 10/1- wound vac placed and then to be taken back to OR on 10/6 on right leg to close left leg had fasciotomy on 10/4- plan for closure on Wednesday -resume heparin post surgery  Progressive anemia  Likely a combination of significant recent acute loss plus decreased production -  -transfused PRBC -stable  Hemorrhoidal GI bleed  Bleeding started 9/25  -bright red blood streaking in stools- appears to be from hemorrhoids - he has visible external hemorrhoids- no pain  - started treatment  for hemorrhoids. - no further GI bleeding since 10/3- mucous stools   Leukocytosis  monitor    S/p transurethral resection of prostate with left ureteroscopic stone manipulation 04/13/13 complicated by perinephric hematoma with bilateral hydronephrosis  underwent urgent  cystoscopy 04/18/2013 with bilateral stent placement and bladder evacuation with suprapubic tube placement - Urology has seen in f/u    Hypotension -  BP stable at this time - felt to be due to hypovolemia  - HOLDING LASIX  -gentle IVF (10 hours) -recheck labs in AM  Hyperkalemia- resolved  Code Status: full Family Communication: patient Disposition Plan:    Consultants:  Nephrology  Vascular  IR  urology  LINES / TUBES:  PIV  9/28 IR placed bilateral sheaths for lytic drip  Suprapubic catheter  CULTURES:  9/17 Blood>>>NTD  9/28 MRSA>> Neg   ANTIBIOTICS:  Ancef 10/1 >>> single dose for Fasciotomy  HPI/Subjective: Pain better Pressure in legs better No SOB, no fevers  Objective: Filed Vitals:   05/19/13 0945  BP: 110/53  Pulse: 86  Temp: 98.4 F (36.9 C)  Resp: 21    Intake/Output Summary (Last 24 hours) at 05/19/13 1005 Last data filed at 05/19/13 0930  Gross per 24 hour  Intake   3024 ml  Output   2925 ml  Net     99 ml   Filed Weights   05/17/13 0430 05/18/13 0500 05/19/13 0400  Weight: 120 kg (264 lb 8.8 oz) 123.4 kg (272 lb 0.8 oz) 121.1 kg (266 lb 15.6 oz)    Exam:   General:  Pleasant/alert  Cardiovascular: rrr  Respiratory: clear anterior  Abdomen: +BS, obese Musculoskeletal: right leg with wound vacs; left leg with vac   Data Reviewed: Basic Metabolic Panel:  Recent Labs Lab 05/13/13 0355 05/14/13 0107 05/18/13 0544 05/18/13 1838 05/19/13 0230  NA 137 138 130* 127* 129*  K 4.2 4.3 5.9* 4.5 4.5  CL 107 107 97 93* 96  CO2 22 23 23 23 22   GLUCOSE 109* 117* 126* 138* 121*  BUN 36* 31* 45* 42* 37*  CREATININE 1.66* 1.38* 2.12* 1.69* 1.48*  CALCIUM 7.8* 7.5* 7.5* 7.0* 6.9*   Liver Function Tests: No results found for this basename: AST, ALT, ALKPHOS, BILITOT, PROT, ALBUMIN,  in the last 168 hours No results found for this basename: LIPASE, AMYLASE,  in the last 168 hours No results found for this basename: AMMONIA,   in the last 168 hours CBC:  Recent Labs Lab 05/17/13 1230 05/17/13 1232 05/18/13 0204 05/18/13 0544 05/18/13 1307 05/19/13 0230  WBC 13.9*  --  13.0* 11.8* 10.9* 11.5*  HGB 10.1*  --  9.5* 9.3* 8.5* 8.1*  HCT 29.5*  --  27.4* 26.7* 25.1* 23.7*  MCV 85.3  --  82.8 83.4 84.2 84.6  PLT 202 201 210 209 215 221   Cardiac Enzymes:  Recent Labs Lab 05/14/13 1456 05/17/13 1230  CKTOTAL 27 373*   BNP (last 3 results)  Recent Labs  04/18/13 1245 04/27/13 1240  PROBNP 1762.0* 1260.0*   CBG:  Recent Labs Lab 05/18/13 1658 05/19/13 0910  GLUCAP 139* 126*    Recent Results (from the past 240 hour(s))  MRSA PCR SCREENING     Status: None   Collection Time    05/11/13  6:17 PM      Result Value Range Status   MRSA by PCR NEGATIVE  NEGATIVE Final   Comment:            The GeneXpert MRSA  Assay (FDA     approved for NASAL specimens     only), is one component of a     comprehensive MRSA colonization     surveillance program. It is not     intended to diagnose MRSA     infection nor to guide or     monitor treatment for     MRSA infections.  CLOSTRIDIUM DIFFICILE BY PCR     Status: Abnormal   Collection Time    05/16/13  2:34 AM      Result Value Range Status   C difficile by pcr POSITIVE (*) NEGATIVE Final   Comment: CRITICAL RESULT CALLED TO, READ BACK BY AND VERIFIED WITH:     Pennelope Bracken 05/16/13 1120 BY K SCHULTZ  MRSA PCR SCREENING     Status: None   Collection Time    05/16/13  3:26 PM      Result Value Range Status   MRSA by PCR NEGATIVE  NEGATIVE Final   Comment:            The GeneXpert MRSA Assay (FDA     approved for NASAL specimens     only), is one component of a     comprehensive MRSA colonization     surveillance program. It is not     intended to diagnose MRSA     infection nor to guide or     monitor treatment for     MRSA infections.     Studies: No results found.  Scheduled Meds: . hydrocortisone   Rectal BID  . metroNIDAZOLE  500 mg  Oral Q8H  . sodium chloride  3 mL Intravenous Q12H  . sodium chloride  3 mL Intravenous Q12H   Continuous Infusions: . sodium chloride 20 mL/hr at 05/18/13 2145  . heparin 2,300 Units/hr (05/19/13 0100)    Principal Problem:   Acute renal failure Active Problems:   Chronic renal insufficiency   Sepsis   Hyperkalemia   DVT, bilateral lower limbs   GI bleed   Pulmonary embolism 9/2    Time spent: 35    [facility], JESSICA  [facility] Pager [phone]. If 7PM-7AM, please contact night-coverage at [email or web address], password [facility] 05/19/2013, 10:05 AM  LOS: 19 days

## 2013-05-19 NOTE — Transfer of Care (Signed)
Immediate Anesthesia Transfer of Care Note  Patient: Jack [name]  Procedure(s) Performed: Procedure(s): RIGHT LOWER EXTREMITY CLOSURE OF LATERAL AND MEDIAL FASCIOTOMIES  (Right)  Patient Location: PACU  Anesthesia Type:General  Level of Consciousness: awake, alert , oriented and sedated  Airway & Oxygen Therapy: Patient Spontanous Breathing and Patient connected to nasal cannula oxygen  Post-op Assessment: Report given to PACU RN, Post -op Vital signs reviewed and stable and Patient moving all extremities  Post vital signs: Reviewed and stable  Complications: No apparent anesthesia complications

## 2013-05-20 ENCOUNTER — Encounter (HOSPITAL_COMMUNITY): Payer: Self-pay | Admitting: Vascular Surgery

## 2013-05-20 LAB — BASIC METABOLIC PANEL
Calcium: 6.8 mg/dL — ABNORMAL LOW (ref 8.4–10.5)
Creatinine, Ser: 1.31 mg/dL (ref 0.50–1.35)
Potassium: 4.5 mEq/L (ref 3.5–5.1)
Sodium: 130 mEq/L — ABNORMAL LOW (ref 135–145)

## 2013-05-20 LAB — CBC
MCHC: 33.2 g/dL (ref 30.0–36.0)
RDW: 15.9 % — ABNORMAL HIGH (ref 11.5–15.5)
WBC: 9.1 10*3/uL (ref 4.0–10.5)

## 2013-05-20 NOTE — Progress Notes (Signed)
ANTICOAGULATION CONSULT NOTE Pharmacy Consult for Heparin Indication: DVT  Allergies  Allergen Reactions  . Contrast Media [Iodinated Diagnostic Agents]     On 04/29/2013 spoke with patient he had some type of procedure at [facility] and broke out in hives after the exams in 1997, he did not know what of type of study he had.  I called [facility] Radiology department the only type of contrast they used during that time  was Conray    Patient Measurements: Height: 6' (182.9 cm) Weight: 266 lb 15.6 oz (121.1 kg) IBW/kg (Calculated) : 77.6 Heparin Dosing Weight: 104kg  Vital Signs: Temp: 98.8 F (37.1 C) (10/06 2354) Temp src: Oral (10/06 2354) BP: 127/42 mmHg (10/06 2200) Pulse Rate: 87 (10/06 2200)  Labs:  Recent Labs  05/17/13 1230 05/17/13 1232  05/18/13 0544 05/18/13 1307 05/18/13 1425 05/18/13 1838 05/18/13 2200 05/19/13 0230 05/19/13 1400 05/19/13 2310  HGB 10.1*  --   < > 9.3* 8.5*  --   --   --  8.1* 7.7*  --   HCT 29.5*  --   < > 26.7* 25.1*  --   --   --  23.7* 22.9*  --   PLT 202 201  < > 209 215  --   --   --  221 224  --   APTT  --  30  --   --   --   --   --   --   --   --   --   LABPROT  --  16.5*  --   --   --   --   --   --   --   --   --   INR  --  1.37  --   --   --   --   --   --   --   --   --   HEPARINUNFRC  --   --   --  0.29*  --  0.27*  --  0.28*  --   --  0.36  CREATININE  --   --   --  2.12*  --   --  1.69*  --  1.48*  --   --   CKTOTAL 373*  --   --   --   --   --   --   --   --   --   --   < > = values in this interval not displayed.  Estimated Creatinine Clearance: 66 ml/min (by C-G formula based on Cr of 1.48).  Assessment: 67 yo m with history of PE, new bilateral DVTs on 9/21, now s/p bilateral leg fasciotomy, for heparin Goal of Therapy:  Heparin level 0.3-0.7 units/ml Monitor platelets by anticoagulation protocol: Yes   Plan:  Continue Heparin at current rate  Follow-up am labs.  Geannie Risen, PharmD, BCPS   05/20/2013 12:10 AM

## 2013-05-20 NOTE — Progress Notes (Signed)
[facility] [place]  Daily Progress Note  Assessment/Planning: S/p B calf fasciotomies, R calf fasciotomy closure   OR tomorrow for likely closure of L calf incision  Subjective   Pain much better  Objective Filed Vitals:   05/20/13 0200 05/20/13 0300 05/20/13 0400 05/20/13 0434  BP: 141/61  130/55   Pulse: 100 84 80   Temp:    98.1 F (36.7 C)  TempSrc:    Oral  Resp: 22 18 21    Height:      Weight:    267 lb 6.7 oz (121.3 kg)  SpO2: 97% 99% 98%     Intake/Output Summary (Last 24 hours) at 05/20/13 1121 Last data filed at 05/20/13 0700  Gross per 24 hour  Intake 1638.67 ml  Output   2251 ml  Net -612.33 ml    RLE: leg bandaged, motor 5/5, DF/PF greatly improved 4-5/5 LLE: VAC in place, soft calf   Laboratory CBC    Component Value Date/Time   WBC 9.1 05/20/2013 0220   HGB 7.4* 05/20/2013 0220   HCT 22.3* 05/20/2013 0220   PLT 227 05/20/2013 0220    BMET    Component Value Date/Time   NA 130* 05/20/2013 0220   K 4.5 05/20/2013 0220   CL 101 05/20/2013 0220   CO2 22 05/20/2013 0220   GLUCOSE 112* 05/20/2013 0220   BUN 26* 05/20/2013 0220   CREATININE 1.31 05/20/2013 0220   CALCIUM 6.8* 05/20/2013 0220   GFRNONAA 55* 05/20/2013 0220   GFRAA 64* 05/20/2013 0220    Leonides Sake, MD [facility] [place] Office: [phone] Pager: [phone]  05/20/2013, 11:21 AM

## 2013-05-20 NOTE — Progress Notes (Signed)
[facility] PROGRESS NOTE  Jack [name]:[id number] DOB: August 31, 1945 DOA: 04/30/2013 PCP: Simone Curia, MD  67 y.o. male with complicated medical history including a recent stay at [facility] for PE S/P TURP who required subsequent bilateral renal stents for obstruction due to hemorrhagic complications of anticoagulation s/p eventual IVC filter. Patient was seen in the ED at [facility] 9/17 where his work up demonstrated creatinine of 6.0, WBC of 37k, potassium of 5.8, sodium 127, tachycardia, and mild hypotension. VQ scan was repeated which was negative for PE. He was given kayexelate in the ED, 2L NS, and transferred to [facility].  8/29 - TURP w/ L ureteral stent [facility]  9/02 - [facility] w/ V/Q "high probability" for PE >> anticoag >> severe hematuria + anemia + renal failure  9/3 - CT abdom reveals large L renal capsular hematoma  9/5 - transferred to [facility] to PCCM service - noted to have large clot in bladder w/ B hydronephrosis  9/5 - IVC filter placement  9/6 - B ureteral stents placed - open evacuation of clot from bladder- suprapubic tube placed  9/7- venous duplex lower extremity - NO DVT  9/21-venous duplex lower extremity- Bilateral: Positive for DVT in the common femoral, profunda, femoral, popliteal, and posterior tibial veins with superficial thrombus in the greater and lesser saphenous veins.  9/22 - IV heparin initiated for DVTs (anticoagulation had been on hold due to renal hematoma)  9/28: Decision was made to perform a catheter directed lysis of clot and patient was transferred to the ICU for monitoring and further care while lytics are on. 9/28-Bilateral popliteal venous acc with initiation of bilateral LE and IVC thrombolysis, popliteal sheaths  9/29- IR inferior venacavagram, thrombolysis/thrombectomy bilateral lower extremities showing improved patency, IVC patent. Advised d/c lytic therapy, continue heparin per pharmacy  9/30 - CT abd/pelvis showed  stable L renal hematoma  10/1- Acute hematoma rt leg below knee, compartment syndrome concerns 10/1- calf fasciotomy performed 10/4 LEFT calf fasciotomy performed 10/6 taken back to OR to close RIGHT fasciotomy     Assessment/Plan:   C diff colitis -started flagyl 10/3 - still with frequent stools  Left leg pain -compartment syndrome- s/p fasciotomy 10/4- to be closed tomorrow after which he can start PT  Acute renal failure/ CKD 3  crt was 1.85 at time of d/c 9/10 - Nephrology has now signed off - ultrasound and Ct abd/pelvis reviewed and there are no acute findings - renal function improving continuously - most likely simple pre-renal state v/s ATN related to hypotension   Bilateral lower extremity DVT / PE  status post IVC filter 9/5 - given recent severe complications related to bleeding anticoagulation was not felt to be wise at the time of admission - further review of records revealed Dopplers from 9/7 which were negative for DVT - repeat dopplers this hospitalization confirmed extensive bilateral lower extremity DVTs - d/c'ed testosterone replacement - due to fear of propagation of clot through IVC filter - had thrombolysis  by IR - was in ICU - then had ?compartment syndrome and was taken to OR on 10/1- wound vac placed and then to be taken back to OR on 10/6 on right leg to close left leg had fasciotomy on 10/4- plan for closure on Wednesday -resumed heparin   Progressive anemia  Likely a combination of significant recent acute loss plus decreased production -  -transfused PRBC -will need another unit o PRBC today- recheck hgb tomorrow  Hemorrhoidal GI bleed  Bleeding  started 9/25  -bright red blood streaking in stools- appears to be from hemorrhoids - he has visible external hemorrhoids- no pain  - started treatment for hemorrhoids. - no further GI bleeding since 10/3- mucous stools   Leukocytosis  monitor    S/p transurethral resection of prostate with left  ureteroscopic stone manipulation 04/13/13 complicated by perinephric hematoma with bilateral hydronephrosis  underwent urgent cystoscopy 04/18/2013 with bilateral stent placement and bladder evacuation with suprapubic tube placement - Urology has seen in f/u    Hypotension -  BP improved - felt to be due to hypovolemia  - HOLDING LASIX  -gentle IVF (10 hours)- now stopped -cont to follow  Hyperkalemia- resolved  Code Status: full Family Communication: patient Disposition Plan:    Consultants:  Nephrology  Vascular  IR  urology  LINES / TUBES:  PIV  9/28 IR placed bilateral sheaths for lytic drip  Suprapubic catheter  CULTURES:  9/17 Blood>>>NTD  9/28 MRSA>> Neg   ANTIBIOTICS:  Ancef 10/1 >>> single dose for Fasciotomy  HPI/Subjective: Pt very optimistic and excited today since he can raise both legs which he has not able to do (esp the right leg)   Objective: Filed Vitals:   05/20/13 1649  BP: 135/53  Pulse: 79  Temp: 98.7 F (37.1 C)  Resp: 21    Intake/Output Summary (Last 24 hours) at 05/20/13 1813 Last data filed at 05/20/13 1700  Gross per 24 hour  Intake 2106.27 ml  Output   3101 ml  Net -994.73 ml   Filed Weights   05/18/13 0500 05/19/13 0400 05/20/13 0434  Weight: 123.4 kg (272 lb 0.8 oz) 121.1 kg (266 lb 15.6 oz) 121.3 kg (267 lb 6.7 oz)    Exam:   General:  Pleasant/alert  Cardiovascular:  RRR, no murmurs  Respiratory: CTA b/l   Abdomen: +BS, NT, ND,  Musculoskeletal: right leg wound is clean- no wound vac-  left leg with vac   Data Reviewed: Basic Metabolic Panel:  Recent Labs Lab 05/14/13 0107 05/18/13 0544 05/18/13 1838 05/19/13 0230 05/20/13 0220  NA 138 130* 127* 129* 130*  K 4.3 5.9* 4.5 4.5 4.5  CL 107 97 93* 96 101  CO2 23 23 23 22 22   GLUCOSE 117* 126* 138* 121* 112*  BUN 31* 45* 42* 37* 26*  CREATININE 1.38* 2.12* 1.69* 1.48* 1.31  CALCIUM 7.5* 7.5* 7.0* 6.9* 6.8*   Liver Function Tests: No results  found for this basename: AST, ALT, ALKPHOS, BILITOT, PROT, ALBUMIN,  in the last 168 hours No results found for this basename: LIPASE, AMYLASE,  in the last 168 hours No results found for this basename: AMMONIA,  in the last 168 hours CBC:  Recent Labs Lab 05/18/13 0544 05/18/13 1307 05/19/13 0230 05/19/13 1400 05/20/13 0220  WBC 11.8* 10.9* 11.5* 8.9 9.1  HGB 9.3* 8.5* 8.1* 7.7* 7.4*  HCT 26.7* 25.1* 23.7* 22.9* 22.3*  MCV 83.4 84.2 84.6 86.7 87.5  PLT 209 215 221 224 227   Cardiac Enzymes:  Recent Labs Lab 05/14/13 1456 05/17/13 1230  CKTOTAL 27 373*   BNP (last 3 results)  Recent Labs  04/18/13 1245 04/27/13 1240  PROBNP 1762.0* 1260.0*   CBG:  Recent Labs Lab 05/18/13 1658 05/19/13 0910 05/19/13 1237 05/19/13 1656  GLUCAP 139* 126* 111* 118*    Recent Results (from the past 240 hour(s))  MRSA PCR SCREENING     Status: None   Collection Time    05/11/13  6:17 PM  Result Value Range Status   MRSA by PCR NEGATIVE  NEGATIVE Final   Comment:            The GeneXpert MRSA Assay (FDA     approved for NASAL specimens     only), is one component of a     comprehensive MRSA colonization     surveillance program. It is not     intended to diagnose MRSA     infection nor to guide or     monitor treatment for     MRSA infections.  CLOSTRIDIUM DIFFICILE BY PCR     Status: Abnormal   Collection Time    05/16/13  2:34 AM      Result Value Range Status   C difficile by pcr POSITIVE (*) NEGATIVE Final   Comment: CRITICAL RESULT CALLED TO, READ BACK BY AND VERIFIED WITH:     Pennelope Bracken 05/16/13 1120 BY K SCHULTZ  MRSA PCR SCREENING     Status: None   Collection Time    05/16/13  3:26 PM      Result Value Range Status   MRSA by PCR NEGATIVE  NEGATIVE Final   Comment:            The GeneXpert MRSA Assay (FDA     approved for NASAL specimens     only), is one component of a     comprehensive MRSA colonization     surveillance program. It is not      intended to diagnose MRSA     infection nor to guide or     monitor treatment for     MRSA infections.     Studies: No results found.  Scheduled Meds: . hydrocortisone   Rectal BID  . metroNIDAZOLE  500 mg Oral Q8H  . sodium chloride  3 mL Intravenous Q12H  . sodium chloride  3 mL Intravenous Q12H   Continuous Infusions: . sodium chloride 10 mL/hr at 05/19/13 2100  . heparin 2,400 Units/hr (05/20/13 1719)    Principal Problem:   Acute renal failure Active Problems:   Chronic renal insufficiency   Sepsis   Hyperkalemia   DVT, bilateral lower limbs   GI bleed   Pulmonary embolism 9/2    Time spent: 35    Tarence Searcy, MD  [facility] Pager [phone]. If 7PM-7AM, please contact night-coverage at [email or web address], password [facility] 05/20/2013, 6:13 PM  LOS: 20 days

## 2013-05-20 NOTE — Progress Notes (Signed)
Subjective: IVC and B LE dvt- thrombolysis 9/28-9/29  Developed Rt calf hematoma and required Rt calf fasciotomy 10/1, incision closure 10/6, dressing and ace bandage applied.  Left LE compartment syndrome requiring fasciotomy 10/4, wound vac intact.   Patient feeling even better today with much improved range of motion of his lower extremities bilaterally and decreased edema bilaterally. He denies any shortness of breath, chest pain, abdominal, back, or flank pain.  Cr. Improving 1.31 today (1.48), Hgb 7.4 today (7.7)   Objective: Physical Exam: BP 130/55  Pulse 80  Temp(Src) 98.1 F (36.7 C) (Oral)  Resp 21  Ht 6' (1.829 m)  Wt 267 lb 6.7 oz (121.3 kg)  BMI 36.26 kg/m2  SpO2 98%  Abd: Soft, ND, NT  Rt LE s/p incision closure 10/6, ace bandage intact, edema decreased, ROM improved 5/5. Able to move both lower extremities and toes. Sensation intact bilaterally.  Left LE s/p fasciotomy, wound vac intact. Soft calf  Labs: CBC  Recent Labs  05/19/13 1400 05/20/13 0220  WBC 8.9 9.1  HGB 7.7* 7.4*  HCT 22.9* 22.3*  PLT 224 227   BMET  Recent Labs  05/19/13 0230 05/20/13 0220  NA 129* 130*  K 4.5 4.5  CL 96 101  CO2 22 22  GLUCOSE 121* 112*  BUN 37* 26*  CREATININE 1.48* 1.31  CALCIUM 6.9* 6.8*   LFT No results found for this basename: PROT, ALBUMIN, AST, ALT, ALKPHOS, BILITOT, BILIDIR, IBILI, LIPASE,  in the last 72 hours PT/INR  Recent Labs  05/17/13 1232  LABPROT 16.5*  INR 1.37     Studies/Results: No results found.  Assessment/Plan: s/p Procedure(s):  FASCIOTOMY with wound vac placement (Right)  APPLICATION OF WOUND VAC (Right)  B LE dvt/IVC- lysis 9/28-29  R posterior leg hematoma/compartment syndrome s/p fasciotomy 10/1, s/p closure 10/6  Left leg compartment syndrome s/p fasciotomy 10/4, OR planned Wednesday 10/8 to close incisions.  Bilateral venous doppler 10/5 subacute non occlusive DVT bilateral femoral and left popliteal veins.  Subacute occlusive DVT right popliteal and posterior tibial veins.  Symptoms greatly improved, appreciate vascular's assistance. Will follow.   LOS: 20 days    Cloretta Ned 05/20/2013 12:11 PM

## 2013-05-20 NOTE — Progress Notes (Signed)
ANTICOAGULATION CONSULT NOTE - Follow Up Consult  Pharmacy Consult for Heparin Indication: DVT  Allergies  Allergen Reactions  . Contrast Media [Iodinated Diagnostic Agents]     On 04/29/2013 spoke with patient he had some type of procedure at [facility] and broke out in hives after the exams in 1997, he did not know what of type of study he had.  I called [facility] Radiology department the only type of contrast they used during that time  was Conray    Patient Measurements: Height: 6' (182.9 cm) Weight: 267 lb 6.7 oz (121.3 kg) IBW/kg (Calculated) : 77.6 Heparin Dosing Weight: 104kg  Vital Signs: Temp: 98.1 F (36.7 C) (10/07 0434) Temp src: Oral (10/07 0434) BP: 130/55 mmHg (10/07 0400) Pulse Rate: 80 (10/07 0400)  Labs:  Recent Labs  05/17/13 1230 05/17/13 1232  05/18/13 1838 05/18/13 2200 05/19/13 0230 05/19/13 1400 05/19/13 2310 05/20/13 0220 05/20/13 0425  HGB 10.1*  --   < >  --   --  8.1* 7.7*  --  7.4*  --   HCT 29.5*  --   < >  --   --  23.7* 22.9*  --  22.3*  --   PLT 202 201  < >  --   --  221 224  --  227  --   APTT  --  30  --   --   --   --   --   --   --   --   LABPROT  --  16.5*  --   --   --   --   --   --   --   --   INR  --  1.37  --   --   --   --   --   --   --   --   HEPARINUNFRC  --   --   < >  --  0.28*  --   --  0.36  --  0.33  CREATININE  --   --   < > 1.69*  --  1.48*  --   --  1.31  --   CKTOTAL 373*  --   --   --   --   --   --   --   --   --   < > = values in this interval not displayed.  Estimated Creatinine Clearance: 74.6 ml/min (by C-G formula based on Cr of 1.31).  Medications:  Heparin @ 2300 units/hr   Assessment: 67 yo m with history of PE s/p IVC filter who developed extensive bilateral DVTs on 9/21 with concern for clot moving through the IVC filter. Heparin initiated. He underwent thrombolytic therapy with [facility] 9/28-9/29. On 10/1 there was concern of developing compartment syndrome in his right leg so he  went to the OR for right leg fasciotomy.   He is now s/p right leg fasciotomy with wound vac and wound closure 10/6.  He is also s/p left leg fasciotomy with wound vac placement and planned closure 10/8. Heparin is at low end of therapeutic goal on 2300 units/hr.    Goal of Therapy:  Heparin level 0.3-0.7 units/ml Monitor platelets by anticoagulation protocol: Yes   Plan:  1) Increase heparin to 2400 units/hr  2) Heparin level and CBC in AM.   Dixie Dials, Pharm.D., BCPS Clinical Pharmacist Pager [phone] 05/20/2013 10:34 AM

## 2013-05-21 ENCOUNTER — Encounter (HOSPITAL_COMMUNITY)
Admission: AD | Disposition: A | Discharge status: Skilled Nursing Facility | Payer: Self-pay | Source: Other Acute Inpatient Hospital | Attending: Internal Medicine

## 2013-05-21 ENCOUNTER — Inpatient Hospital Stay (HOSPITAL_COMMUNITY): Payer: PRIVATE HEALTH INSURANCE | Admitting: Anesthesiology

## 2013-05-21 ENCOUNTER — Encounter (HOSPITAL_COMMUNITY): Payer: PRIVATE HEALTH INSURANCE | Admitting: Anesthesiology

## 2013-05-21 ENCOUNTER — Encounter (HOSPITAL_COMMUNITY): Payer: Self-pay | Admitting: Anesthesiology

## 2013-05-21 DIAGNOSIS — M79A29 Nontraumatic compartment syndrome of unspecified lower extremity: Secondary | ICD-10-CM

## 2013-05-21 DIAGNOSIS — IMO0002 Reserved for concepts with insufficient information to code with codable children: Secondary | ICD-10-CM

## 2013-05-21 HISTORY — PX: FASCIOTOMY CLOSURE: SHX5829

## 2013-05-21 LAB — CBC
HCT: 26.1 % — ABNORMAL LOW (ref 39.0–52.0)
Hemoglobin: 8.4 g/dL — ABNORMAL LOW (ref 13.0–17.0)
MCHC: 32.2 g/dL (ref 30.0–36.0)
MCV: 88.5 fL (ref 78.0–100.0)
Platelets: 244 10*3/uL (ref 150–400)
RBC: 2.95 MIL/uL — ABNORMAL LOW (ref 4.22–5.81)
RDW: 16.2 % — ABNORMAL HIGH (ref 11.5–15.5)

## 2013-05-21 LAB — TYPE AND SCREEN
ABO/RH(D): O NEG
Antibody Screen: NEGATIVE
Unit division: 0

## 2013-05-21 LAB — BASIC METABOLIC PANEL
BUN: 17 mg/dL (ref 6–23)
CO2: 23 mEq/L (ref 19–32)
Chloride: 102 mEq/L (ref 96–112)
Creatinine, Ser: 1.13 mg/dL (ref 0.50–1.35)
GFR calc Af Amer: 76 mL/min — ABNORMAL LOW (ref >90)
GFR calc non Af Amer: 66 mL/min — ABNORMAL LOW (ref >90)
Glucose, Bld: 103 mg/dL — ABNORMAL HIGH (ref 70–99)
Potassium: 4.4 mEq/L (ref 3.5–5.1)

## 2013-05-21 LAB — HEPARIN LEVEL (UNFRACTIONATED)
Heparin Unfractionated: <0.1 IU/mL — ABNORMAL LOW (ref 0.30–0.70)
Heparin Unfractionated: 1.48 IU/mL — ABNORMAL HIGH (ref 0.30–0.70)

## 2013-05-21 SURGERY — FASCIOTOMY CLOSURE
Anesthesia: General | Site: Leg Lower | Laterality: Left | Wound class: Clean

## 2013-05-21 MED ORDER — METOCLOPRAMIDE HCL 5 MG/ML IJ SOLN: 10.0000 mg | Freq: Once | INTRAMUSCULAR | Status: DC | PRN

## 2013-05-21 MED ORDER — CEFAZOLIN SODIUM-DEXTROSE 2-3 GM-% IV SOLR
INTRAVENOUS | Status: AC
  Filled 2013-05-21: qty 50

## 2013-05-21 MED ORDER — HYDROMORPHONE HCL PF 1 MG/ML IJ SOLN
INTRAMUSCULAR | Status: AC
  Filled 2013-05-21: qty 1

## 2013-05-21 MED ORDER — MIDAZOLAM HCL 5 MG/5ML IJ SOLN
INTRAMUSCULAR | Status: DC | PRN
  Administered 2013-05-21: 1 mg via INTRAVENOUS

## 2013-05-21 MED ORDER — FENTANYL CITRATE 0.05 MG/ML IJ SOLN
INTRAMUSCULAR | Status: DC | PRN
  Administered 2013-05-21 (×3): 25 ug via INTRAVENOUS
  Administered 2013-05-21: 50 ug via INTRAVENOUS
  Administered 2013-05-21: 25 ug via INTRAVENOUS
  Administered 2013-05-21: 50 ug via INTRAVENOUS

## 2013-05-21 MED ORDER — LACTATED RINGERS IV SOLN
INTRAVENOUS | Status: DC | PRN
  Administered 2013-05-21: 08:00:00 via INTRAVENOUS

## 2013-05-21 MED ORDER — PROPOFOL 10 MG/ML IV BOLUS
INTRAVENOUS | Status: DC | PRN
  Administered 2013-05-21: 130 mg via INTRAVENOUS

## 2013-05-21 MED ORDER — LIDOCAINE HCL (CARDIAC) 20 MG/ML IV SOLN
INTRAVENOUS | Status: DC | PRN
  Administered 2013-05-21: 60 mg via INTRAVENOUS

## 2013-05-21 MED ORDER — ONDANSETRON HCL 4 MG/2ML IJ SOLN
INTRAMUSCULAR | Status: DC | PRN
  Administered 2013-05-21: 4 mg via INTRAMUSCULAR

## 2013-05-21 MED ORDER — OXYCODONE HCL 5 MG/5ML PO SOLN: 5.0000 mg | Freq: Once | ORAL | Status: DC | PRN

## 2013-05-21 MED ORDER — OXYCODONE HCL 5 MG PO TABS: 5.0000 mg | ORAL_TABLET | Freq: Once | ORAL | Status: DC | PRN

## 2013-05-21 MED ORDER — 0.9 % SODIUM CHLORIDE (POUR BTL) OPTIME
TOPICAL | Status: DC | PRN
  Administered 2013-05-21: 1000 mL

## 2013-05-21 MED ORDER — ARTIFICIAL TEARS OP OINT
TOPICAL_OINTMENT | OPHTHALMIC | Status: DC | PRN
  Administered 2013-05-21: 1 via OPHTHALMIC

## 2013-05-21 MED ORDER — HYDROMORPHONE HCL PF 1 MG/ML IJ SOLN
0.2500 mg | INTRAMUSCULAR | Status: DC | PRN
  Administered 2013-05-21 (×2): 0.5 mg via INTRAVENOUS

## 2013-05-21 MED ORDER — CEFAZOLIN SODIUM-DEXTROSE 2-3 GM-% IV SOLR
2.0000 g | Freq: Once | INTRAVENOUS | Status: AC
  Administered 2013-05-21: 2 g via INTRAVENOUS

## 2013-05-21 SURGICAL SUPPLY — 35 items
BANDAGE ELASTIC 4 VELCRO ST LF (GAUZE/BANDAGES/DRESSINGS) ×4 IMPLANT
BANDAGE ELASTIC 6 VELCRO ST LF (GAUZE/BANDAGES/DRESSINGS) ×4 IMPLANT
BANDAGE GAUZE ELAST BULKY 4 IN (GAUZE/BANDAGES/DRESSINGS) ×8 IMPLANT
CANISTER SUCTION 2500CC (MISCELLANEOUS) ×2 IMPLANT
CONT SPECI 4OZ STER CLIK (MISCELLANEOUS) ×2 IMPLANT
COVER SURGICAL LIGHT HANDLE (MISCELLANEOUS) ×2 IMPLANT
DRAPE INCISE IOBAN 66X45 STRL (DRAPES) IMPLANT
DRSG PAD ABDOMINAL 8X10 ST (GAUZE/BANDAGES/DRESSINGS) ×8 IMPLANT
ELECT REM PT RETURN 9FT ADLT (ELECTROSURGICAL) ×2
ELECTRODE REM PT RTRN 9FT ADLT (ELECTROSURGICAL) ×1 IMPLANT
GLOVE BIO SURGEON STRL SZ7 (GLOVE) ×2 IMPLANT
GLOVE BIOGEL PI IND STRL 6.5 (GLOVE) ×1 IMPLANT
GLOVE BIOGEL PI IND STRL 7.5 (GLOVE) ×1 IMPLANT
GLOVE BIOGEL PI INDICATOR 6.5 (GLOVE) ×1
GLOVE BIOGEL PI INDICATOR 7.5 (GLOVE) ×1
GLOVE SURG SS PI 7.0 STRL IVOR (GLOVE) ×2 IMPLANT
GOWN STRL NON-REIN LRG LVL3 (GOWN DISPOSABLE) ×6 IMPLANT
GOWN STRL REIN XL XLG (GOWN DISPOSABLE) ×2 IMPLANT
KIT BASIN OR (CUSTOM PROCEDURE TRAY) ×2 IMPLANT
KIT ROOM TURNOVER OR (KITS) ×2 IMPLANT
NS IRRIG 1000ML POUR BTL (IV SOLUTION) ×2 IMPLANT
PACK GENERAL/GYN (CUSTOM PROCEDURE TRAY) ×2 IMPLANT
PACK UNIVERSAL I (CUSTOM PROCEDURE TRAY) ×2 IMPLANT
PAD ARMBOARD 7.5X6 YLW CONV (MISCELLANEOUS) ×4 IMPLANT
SPONGE GAUZE 4X4 12PLY (GAUZE/BANDAGES/DRESSINGS) IMPLANT
SUT ETHILON 3 0 PS 1 (SUTURE) ×6 IMPLANT
SUT MNCRL AB 4-0 PS2 18 (SUTURE) IMPLANT
SUT VIC AB 2-0 CTX 36 (SUTURE) IMPLANT
SUT VIC AB 3-0 SH 27 (SUTURE) ×2
SUT VIC AB 3-0 SH 27X BRD (SUTURE) ×2 IMPLANT
SWAB COLLECTION DEVICE MRSA (MISCELLANEOUS) ×2 IMPLANT
TOWEL OR 17X24 6PK STRL BLUE (TOWEL DISPOSABLE) ×2 IMPLANT
TOWEL OR 17X26 10 PK STRL BLUE (TOWEL DISPOSABLE) ×2 IMPLANT
TUBE ANAEROBIC SPECIMEN COL (MISCELLANEOUS) ×2 IMPLANT
WATER STERILE IRR 1000ML POUR (IV SOLUTION) ×2 IMPLANT

## 2013-05-21 NOTE — Progress Notes (Signed)
PT Cancellation Note  Patient Details Name: Jack [name] MRN: [id number] DOB: 22-Oct-1945   Cancelled Treatment:    Reason Eval/Treat Not Completed: Medical issues which prohibited therapy.  Pt went for closure of LLE wound today.  Will re-evaluate tomorrow as allowed.  Thanks.   INGOLD,Jeric Slagel 05/21/2013, 11:01 AM [facility] [phone] (pager)

## 2013-05-21 NOTE — Interval H&P Note (Signed)
History and Physical Interval Note:  05/21/2013 8:18 AM  Jack [name]  has presented today for surgery, with the diagnosis of Compartment syndrome of left lower extremity   The various methods of treatment have been discussed with the patient and family. After consideration of risks, benefits and other options for treatment, the patient has consented to  Procedure(s): FASCIOTOMY CLOSURE- LEFT LEG (Left) as a surgical intervention .  The patient's history has been reviewed, patient examined, no change in status, stable for surgery.  I have reviewed the patient's chart and labs.  Questions were answered to the patient's satisfaction.     CHEN,BRIAN LIANG-YU

## 2013-05-21 NOTE — Progress Notes (Signed)
ANTICOAGULATION CONSULT NOTE - Follow Up Consult  Pharmacy Consult for Heparin Indication: DVT  Allergies  Allergen Reactions  . Contrast Media [Iodinated Diagnostic Agents]     On 04/29/2013 spoke with patient he had some type of procedure at [facility] and broke out in hives after the exams in 1997, he did not know what of type of study he had.  I called [facility] Radiology department the only type of contrast they used during that time  was Conray    Patient Measurements: Height: 6' (182.9 cm) Weight: 261 lb 7.5 oz (118.6 kg) IBW/kg (Calculated) : 77.6 Heparin Dosing Weight: 104kg  Vital Signs: Temp: 97 F (36.1 C) (10/08 1024) Temp src: Oral (10/08 0733) BP: 130/57 mmHg (10/08 1200) Pulse Rate: 83 (10/08 1200)  Labs:  Recent Labs  05/19/13 0230 05/19/13 1400 05/19/13 2310 05/20/13 0220 05/20/13 0425 05/21/13 0615  HGB 8.1* 7.7*  --  7.4*  --  8.4*  HCT 23.7* 22.9*  --  22.3*  --  26.1*  PLT 221 224  --  227  --  244  HEPARINUNFRC  --   --  0.36  --  0.33 <0.10*  CREATININE 1.48*  --   --  1.31  --  1.13    Estimated Creatinine Clearance: 85.5 ml/min (by C-G formula based on Cr of 1.13).    Assessment: 67 yo m with history of PE s/p IVC filter who developed extensive bilateral DVTs on 9/21 with concern for clot moving through the IVC filter. Heparin initiated. He underwent thrombolytic therapy with [facility] 9/28-9/29. On 10/1 there was concern of developing compartment syndrome in his right leg so he went to the OR for right leg fasciotomy.   He is now s/p right leg fasciotomy with wound vac and wound closure 10/6.  He is also s/p left leg fasciotomy with wound vac placement.  He went to OR today for closure of L leg fasciotomy.  He has returned with no bleeding, CBC stable, and heparin drip restarted post op at 2400 uts/hr.   - will recheck heparin level this evening.     Goal of Therapy:  Heparin level 0.3-0.7 units/ml Monitor platelets by  anticoagulation protocol: Yes   Plan:  1) IContinue heparin  2400 units/hr  2) Heparin level in 6hr 3) Daily HL, CBC    Leota Sauers Pharm.D. CPP, BCPS Clinical Pharmacist [phone] 05/21/2013 12:14 PM

## 2013-05-21 NOTE — Preoperative (Signed)
Beta Blockers   Reason not to administer Beta Blockers:Not Applicable 

## 2013-05-21 NOTE — Transfer of Care (Signed)
Immediate Anesthesia Transfer of Care Note  Patient: Jack [name]  Procedure(s) Performed: Procedure(s): FASCIOTOMY CLOSURE- LEFT LEG (Left)  Patient Location: PACU  Anesthesia Type:General  Level of Consciousness: awake, alert  and oriented  Airway & Oxygen Therapy: Patient Spontanous Breathing and Patient connected to nasal cannula oxygen  Post-op Assessment: Report given to PACU RN, Post -op Vital signs reviewed and stable and Patient moving all extremities X 4  Post vital signs: Reviewed and stable  Complications: No apparent anesthesia complications

## 2013-05-21 NOTE — Progress Notes (Signed)
ANTICOAGULATION CONSULT NOTE - Follow Up Consult  Pharmacy Consult for Heparin Indication: DVT  Allergies  Allergen Reactions  . Contrast Media [Iodinated Diagnostic Agents]     On 04/29/2013 spoke with patient he had some type of procedure at [facility] and broke out in hives after the exams in 1997, he did not know what of type of study he had.  I called [facility] Radiology department the only type of contrast they used during that time  was Conray    Patient Measurements: Height: 6' (182.9 cm) Weight: 261 lb 7.5 oz (118.6 kg) IBW/kg (Calculated) : 77.6 Heparin Dosing Weight: 104kg  Vital Signs: Temp: 98 F (36.7 C) (10/08 2000) Temp src: Oral (10/08 2000) BP: 97/44 mmHg (10/08 1900) Pulse Rate: 77 (10/08 1900)  Labs:  Recent Labs  05/19/13 0230 05/19/13 1400  05/20/13 0220 05/20/13 0425 05/21/13 0615 05/21/13 2015  HGB 8.1* 7.7*  --  7.4*  --  8.4*  --   HCT 23.7* 22.9*  --  22.3*  --  26.1*  --   PLT 221 224  --  227  --  244  --   HEPARINUNFRC  --   --   < >  --  0.33 <0.10* 1.48*  CREATININE 1.48*  --   --  1.31  --  1.13  --   < > = values in this interval not displayed.  Estimated Creatinine Clearance: 85.5 ml/min (by C-G formula based on Cr of 1.13).  Assessment: 67 yo m with history of PE s/p IVC filter who developed extensive bilateral DVTs on 9/21 with concern for clot moving through the IVC filter. Heparin initiated. He underwent thrombolytic therapy with [facility] 9/28-9/29. On 10/1 there was concern of developing compartment syndrome in his right leg so he went to the OR for right leg fasciotomy.   He is now s/p right leg fasciotomy with wound vac and wound closure 10/6.  He is also s/p left leg fasciotomy with wound vac placement.  He went to OR today for closure of L leg fasciotomy.  He has returned with no bleeding, CBC stable, and heparin drip restarted post op at 2400 uts/hr.  His heparin level comes back supra-therapeutic but was in process  for over 3 hours so don't feel this is reflective of true response considering drastic change - (0.1>1.48).  I spoke with the nurse who reports no noted IV line complications and no bleeding.  Goal of Therapy:  Heparin level 0.3-0.7 units/ml Monitor platelets by anticoagulation protocol: Yes   Plan:  1) Will decrease IV heparin  To 2200 units/hr  2) Heparin level in 6hr 3) Daily HL, CBC   Nadara Mustard, PharmD., MS Clinical Pharmacist Pager:  [phone] Thank you for allowing pharmacy to be part of this patients care team. 05/21/2013 10:33 PM

## 2013-05-21 NOTE — OR Nursing (Signed)
Iliostomy tube draining cloudy, dark yellow urine with a lot of sediment. Left antecub and upper arm are edematous (likely IV infilitration). Informed CRNA regarding findings.

## 2013-05-21 NOTE — Anesthesia Postprocedure Evaluation (Signed)
Anesthesia Post Note  Patient: Jack [name]  Procedure(s) Performed: Procedure(s) (LRB): FASCIOTOMY CLOSURE- LEFT LEG (Left)  Anesthesia type: General  Patient location: PACU  Post pain: Pain level controlled  Post assessment: Patient's Cardiovascular Status Stable  Last Vitals:  Filed Vitals:   05/21/13 1024  BP:   Pulse:   Temp: 36.1 C  Resp:     Post vital signs: Reviewed and stable  Level of consciousness: alert  Complications: No apparent anesthesia complications

## 2013-05-21 NOTE — Anesthesia Procedure Notes (Signed)
Procedure Name: LMA Insertion Date/Time: 05/21/2013 8:48 AM Performed by: Gayla Medicus Pre-anesthesia Checklist: Patient identified, Timeout performed, Emergency Drugs available, Suction available and Patient being monitored Patient Re-evaluated:Patient Re-evaluated prior to inductionOxygen Delivery Method: Circle system utilized Preoxygenation: Pre-oxygenation with 100% oxygen Intubation Type: IV induction LMA: LMA inserted LMA Size: 5.0 Number of attempts: 1 Placement Confirmation: positive ETCO2 and breath sounds checked- equal and bilateral Tube secured with: Tape Dental Injury: Teeth and Oropharynx as per pre-operative assessment

## 2013-05-21 NOTE — Anesthesia Preprocedure Evaluation (Signed)
Anesthesia Evaluation  Patient identified by MRN, date of birth, ID band Patient awake    Reviewed: Allergy & Precautions, H&P , NPO status , Patient's Chart, lab work & pertinent test results, reviewed documented beta blocker date and time   Airway Mallampati: II TM Distance: >3 FB Neck ROM: full    Dental   Pulmonary shortness of breath and with exertion, asthma ,  breath sounds clear to auscultation        Cardiovascular hypertension, On Medications + Peripheral Vascular Disease Rhythm:regular     Neuro/Psych negative neurological ROS  negative psych ROS   GI/Hepatic negative GI ROS, Neg liver ROS,   Endo/Other  negative endocrine ROS  Renal/GU Renal InsufficiencyRenal disease  negative genitourinary   Musculoskeletal   Abdominal   Peds  Hematology  (+) anemia ,   Anesthesia Other Findings See surgeon's H&P   Reproductive/Obstetrics negative OB ROS                           Anesthesia Physical Anesthesia Plan  ASA: III  Anesthesia Plan: General   Post-op Pain Management:    Induction: Intravenous  Airway Management Planned: LMA  Additional Equipment:   Intra-op Plan:   Post-operative Plan:   Informed Consent: I have reviewed the patients History and Physical, chart, labs and discussed the procedure including the risks, benefits and alternatives for the proposed anesthesia with the patient or authorized representative who has indicated his/her understanding and acceptance.   Dental Advisory Given  Plan Discussed with: CRNA and Surgeon  Anesthesia Plan Comments:         Anesthesia Quick Evaluation

## 2013-05-21 NOTE — Progress Notes (Signed)
[facility] Progress Note [place] TEAM 1 - Stepdown/ICU TEAM  Jack [name] ZOX:[id number] DOB: 1946/01/02 DOA: 04/30/2013 PCP: Simone Curia, MD  HPI / Hospital Course: 67 y.o. male with complicated medical history including a recent stay at [facility] for PE S/P TURP who required subsequent bilateral renal stents for obstruction due to hemorrhagic complications of anticoagulation s/p eventual IVC filter. Patient was seen in the ED at [facility] 9/17 where his work up demonstrated creatinine of 6.0, WBC of 37k, potassium of 5.8, sodium 127, tachycardia, and mild hypotension. VQ scan was repeated which was negative for PE. He was given kayexelate in the ED, 2L NS, and transferred to [facility].   8/29 - TURP w/ L ureteral stent [facility]  9/02 - admit [facility] w/ V/Q "high probability" for PE >> anticoag >> severe hematuria + anemia + renal failure  9/3 - CT abdom reveals large L renal capsular hematoma  9/5 - transferred to [facility] to PCCM service - noted to have large clot in bladder w/ B hydronephrosis  9/5 - IVC filter placement  9/6 - B ureteral stents placed - open evacuation of clot from bladder- suprapubic tube placed  9/7- venous duplex lower extremity - NO DVT  9/21-venous duplex lower extremity- Bilateral: Positive for DVT in the common femoral, profunda, femoral, popliteal, and posterior tibial veins with superficial thrombus in the greater and lesser saphenous veins.  9/22 - IV heparin initiated for DVTs (anticoagulation had been on hold due to renal hematoma)  9/28: Decision was made to perform a catheter directed lysis of clot and patient was transferred to the ICU for monitoring and further care while on lytics. 9/28-Bilateral popliteal venous acc with initiation of bilateral LE and IVC thrombolysis, popliteal sheaths  9/29- IR inferior venacavagram, thrombolysis/thrombectomy bilateral lower extremities showing improved patency, IVC patent. Advised d/c lytic therapy,  continue heparin per pharmacy  9/30 - CT abd/pelvis showed stable L renal hematoma  10/1- Acute hematoma rt leg below knee, compartment syndrome  10/1- calf fasciotomy performed 10/4 LEFT calf fasciotomy performed 10/6 taken back to OR to close RIGHT fasciotomy 10/8 to OR to close LEFT fasciotomy  Assessment/Plan:  B LE compartment syndrome due to bleeding/hematoma S/p B fasciotomies - Vasc Surgery following - appears to be improving nicely   C diff colitis started flagyl 10/3 - diarrhea appears to be slowing already   Acute renal failure/ CKD 3  crt was 1.85 at time of d/c 9/10 - Nephrology has signed off - ultrasound and Ct abd/pelvis revealed no acute findings - most likely simple pre-renal state v/s ATN related to hypotension - crt has nearly normalized as of today   Bilateral lower extremity DVT / PE  status post IVC filter 9/5 - given recent severe complications related to bleeding anticoagulation was not felt to be wise at the time of admission - further review of records revealed Dopplers from 9/7 which were negative for DVT - repeat dopplers this hospitalization confirmed extensive bilateral lower extremity DVTs - d/c'ed testosterone replacement - due to fear of propagation of clot through IVC filter heparin was subsequently intitiated for anticoag w/ closer monitoring - underwent thrombolysis by IR - was in ICU - then developed compartment syndrome and was taken to OR on 10/1- wound vac placed and then to be taken back to OR on 10/6 on right leg to close - left leg had fasciotomy on 10/4 with closure 10/8 - on heparin   Progressive anemia  Likely a combination of  significant recent acute loss plus decreased production - transfused multiple units of PRBC during this stay - Hgb improved today - follow in serial fashion  Hemorrhoidal GI bleed   -bright red blood streaking in stools 9/25 - appears to be from hemorrhoids - no further GI bleeding since 10/3- mucous stools   S/p  transurethral resection of prostate with left ureteroscopic stone manipulation 04/13/13 complicated by perinephric hematoma with bilateral hydronephrosis  underwent urgent cystoscopy 04/18/2013 with bilateral stent placement and bladder evacuation with suprapubic tube placement - Urology has seen in f/u  Hypotension -  BP has now stabilized  Hyperkalemia resolved  Code Status: FULL Family Communication: patient and wife at bedside  Disposition Plan: SDU   Consultants:  Nephrology  Vascular Surgery   IR  Urology  ANTIBIOTICS:  Flagyl 10/3 >>  HPI/Subjective: Pt remains very optimistic and excited since he can raise both legs which he has not able to do (esp the right leg).  Denies cp, f/c, sob, n/v, or abdom pain.    Objective: Filed Vitals:   05/21/13 1200  BP: 130/57  Pulse: 83  Temp: 98.1 F (36.7 C)  Resp: 14    Intake/Output Summary (Last 24 hours) at 05/21/13 1243 Last data filed at 05/21/13 1200  Gross per 24 hour  Intake 1591.6 ml  Output   3375 ml  Net -1783.4 ml   Filed Weights   05/19/13 0400 05/20/13 0434 05/21/13 0421  Weight: 121.1 kg (266 lb 15.6 oz) 121.3 kg (267 lb 6.7 oz) 118.6 kg (261 lb 7.5 oz)    Exam: General: No acute respiratory distress Lungs: Clear to auscultation bilaterally without wheezes or crackles Cardiovascular: Regular rate and rhythm without murmur gallop or rub normal S1 and S2 Abdomen: Nontender, nondistended, soft, bowel sounds positive, no rebound, no ascites, no appreciable mass Extremities: B le dressed in ACE bandages - no evidence of bleeding - able to lift both legs off bed - moves toes B feet - intact to sensation B feet  Data Reviewed: Basic Metabolic Panel:  Recent Labs Lab 05/18/13 0544 05/18/13 1838 05/19/13 0230 05/20/13 0220 05/21/13 0615  NA 130* 127* 129* 130* 131*  K 5.9* 4.5 4.5 4.5 4.4  CL 97 93* 96 101 102  CO2 23 23 22 22 23   GLUCOSE 126* 138* 121* 112* 103*  BUN 45* 42* 37* 26* 17   CREATININE 2.12* 1.69* 1.48* 1.31 1.13  CALCIUM 7.5* 7.0* 6.9* 6.8* 7.4*   Liver Function Tests: No results found for this basename: AST, ALT, ALKPHOS, BILITOT, PROT, ALBUMIN,  in the last 168 hours  CBC:  Recent Labs Lab 05/18/13 1307 05/19/13 0230 05/19/13 1400 05/20/13 0220 05/21/13 0615  WBC 10.9* 11.5* 8.9 9.1 7.6  HGB 8.5* 8.1* 7.7* 7.4* 8.4*  HCT 25.1* 23.7* 22.9* 22.3* 26.1*  MCV 84.2 84.6 86.7 87.5 88.5  PLT 215 221 224 227 244   Cardiac Enzymes:  Recent Labs Lab 05/14/13 1456 05/17/13 1230  CKTOTAL 27 373*   BNP (last 3 results)  Recent Labs  04/18/13 1245 04/27/13 1240  PROBNP 1762.0* 1260.0*   CBG:  Recent Labs Lab 05/18/13 1658 05/19/13 0910 05/19/13 1237 05/19/13 1656  GLUCAP 139* 126* 111* 118*    Recent Results (from the past 240 hour(s))  MRSA PCR SCREENING     Status: None   Collection Time    05/11/13  6:17 PM      Result Value Range Status   MRSA by PCR NEGATIVE  NEGATIVE Final  Comment:            The GeneXpert MRSA Assay (FDA     approved for NASAL specimens     only), is one component of a     comprehensive MRSA colonization     surveillance program. It is not     intended to diagnose MRSA     infection nor to guide or     monitor treatment for     MRSA infections.  CLOSTRIDIUM DIFFICILE BY PCR     Status: Abnormal   Collection Time    05/16/13  2:34 AM      Result Value Range Status   C difficile by pcr POSITIVE (*) NEGATIVE Final   Comment: CRITICAL RESULT CALLED TO, READ BACK BY AND VERIFIED WITH:     Pennelope Bracken 05/16/13 1120 BY K SCHULTZ  MRSA PCR SCREENING     Status: None   Collection Time    05/16/13  3:26 PM      Result Value Range Status   MRSA by PCR NEGATIVE  NEGATIVE Final   Comment:            The GeneXpert MRSA Assay (FDA     approved for NASAL specimens     only), is one component of a     comprehensive MRSA colonization     surveillance program. It is not     intended to diagnose MRSA      infection nor to guide or     monitor treatment for     MRSA infections.     Studies: No results found.  Scheduled Meds: . ceFAZolin      . hydrocortisone   Rectal BID  . HYDROmorphone      . metroNIDAZOLE  500 mg Oral Q8H  . sodium chloride  3 mL Intravenous Q12H   Time spent: 35 mins  [facility] T, MD  [facility] Office  [phone] Pager [phone]  On-Call/Text Page:      [email or web address]      password [facility]  05/21/2013, 12:43 PM  LOS: 21 days

## 2013-05-21 NOTE — Op Note (Signed)
OPERATIVE NOTE   PROCEDURE: 1. Left leg fasciotomy closure (15 cm length)  PRE-OPERATIVE DIAGNOSIS: Left leg compartment syndrome  POST-OPERATIVE DIAGNOSIS: same as above   SURGEON: Leonides Sake, MD  ANESTHESIA: general  ESTIMATED BLOOD LOSS: 30 cc  FINDING(S): 1. Viable muscle in left leg  SPECIMEN(S):  none  INDICATIONS:   Jack [name] is a 67 y.o. male who underwent right leg fasciotomy for compartment syndrome that was subsequently closed.  His left leg required fasciotomies at a later date.  He returns to OR to day for closure of his left leg fasciotomy incisions.  Risks and benefits of the procedure were discussed with the patient.  Risks include but are not limited to bleeding, infection, need for additional procedure, inability to close the fasciotomy incision, myocardial infarction, stroke, and death.  He agrees to proceed.  DESCRIPTION: After obtaining full informed written consent, the patient was brought back to the operating room and placed supine upon the operating table.  The patient received IV antibiotics prior to induction.  After obtaining adequate anesthesia, the patient was prepped and draped in the standard fashion for: left leg fasciotomy closure.  I turned my attention to the lateral incision.  The visible muscle contracted to electric stimulation.  I placed two vertical mattresses stitches of 3-0 Vicryl to reapproximate the skin.  The skin incision was then reapproximated with staples.  I turned my attention to the medical incision.  The visible muscle contracted to electric stimulation.  I placed two vertical mattresses stitches of 3-0 Vicryl to reapproximate the skin.  The skin incision was then reapproximated with staples.  I washed the left leg and dried the skin.  ABD pads were applied to the left leg, covering the stapled incisions.  Two Kerlix were wrapped around the left lower leg.  I then wrapped the left leg with a 6 inch and 4 inch ACE wrap.  COMPLICATIONS:  none  CONDITION: stable  Leonides Sake, MD [facility] [place] Office: [phone] Pager: [phone]  05/21/2013, 9:33 AM

## 2013-05-21 NOTE — OR Nursing (Signed)
Late note from 05/20/2013 0930: Changed right lower leg dressing in OR (2 abdominal pads, kerlex, 2 x 4" ace wrap). Removed left lower leg wound vac, I&D and applied dressing.

## 2013-05-22 ENCOUNTER — Encounter (HOSPITAL_COMMUNITY): Payer: Self-pay | Admitting: Vascular Surgery

## 2013-05-22 DIAGNOSIS — R5381 Other malaise: Secondary | ICD-10-CM

## 2013-05-22 LAB — CBC
MCHC: 33.1 g/dL (ref 30.0–36.0)
MCV: 88 fL (ref 78.0–100.0)
Platelets: 247 10*3/uL (ref 150–400)
RBC: 2.92 MIL/uL — ABNORMAL LOW (ref 4.22–5.81)
RDW: 16 % — ABNORMAL HIGH (ref 11.5–15.5)

## 2013-05-22 LAB — HEPARIN LEVEL (UNFRACTIONATED)
Heparin Unfractionated: 1.1 IU/mL — ABNORMAL HIGH (ref 0.30–0.70)
Heparin Unfractionated: 0.77 IU/mL — ABNORMAL HIGH (ref 0.30–0.70)

## 2013-05-22 MED ORDER — HEPARIN (PORCINE) IN NACL 100-0.45 UNIT/ML-% IJ SOLN
1800.0000 [IU]/h | INTRAMUSCULAR | Status: AC
  Filled 2013-05-22 (×3): qty 250

## 2013-05-22 NOTE — Progress Notes (Addendum)
[facility] Progress Note  05/22/2013 8:20 AM 1 Day Post-Op  Subjective:  C/o left leg hurting more than the right  Afebrile VSS  Filed Vitals:   05/22/13 0800  BP: 113/52  Pulse: 75  Temp: 98.3 F (36.8 C)  Resp: 21    Physical Exam: Incisions:  Bilateral fasciotomy incisions are closed with staples in tact.  Minimal tension on incisions. Extremities:  Able to lift both legs off the bed-left leg with more ease than the right.  Sensation is in tact bilaterally.  CBC    Component Value Date/Time   WBC 6.1 05/22/2013 0555   RBC 2.92* 05/22/2013 0555   RBC 3.46* 05/17/2013 1230   HGB 8.5* 05/22/2013 0555   HCT 25.7* 05/22/2013 0555   PLT 247 05/22/2013 0555   MCV 88.0 05/22/2013 0555   MCH 29.1 05/22/2013 0555   MCHC 33.1 05/22/2013 0555   RDW 16.0* 05/22/2013 0555   LYMPHSABS 1.0 05/02/2013 0325   MONOABS 1.5* 05/02/2013 0325   EOSABS 0.1 05/02/2013 0325   BASOSABS 0.0 05/02/2013 0325    BMET    Component Value Date/Time   NA 131* 05/21/2013 0615   K 4.4 05/21/2013 0615   CL 102 05/21/2013 0615   CO2 23 05/21/2013 0615   GLUCOSE 103* 05/21/2013 0615   BUN 17 05/21/2013 0615   CREATININE 1.13 05/21/2013 0615   CALCIUM 7.4* 05/21/2013 0615   GFRNONAA 66* 05/21/2013 0615   GFRAA 76* 05/21/2013 0615    INR    Component Value Date/Time   INR 1.37 05/17/2013 1232     Intake/Output Summary (Last 24 hours) at 05/22/13 0820 Last data filed at 05/22/13 0700  Gross per 24 hour  Intake 2469.77 ml  Output   2713 ml  Net -243.23 ml     Assessment:  67 y.o. male is s/p:  Left leg fasciotomy closure (15 cm length)  1 Day Post-Op And Closure of right leg fasciotomy incisions (~91 cm) POD 3  Plan: -dressing removed and changed-incisions are c/d/i with staples in tact. -motor in tact-able to move left leg with more ease than right.  Hopefully, this will improve with PT -DVT prophylaxis:  Heparin gtt   Doreatha Massed, PA-C [facility] [phone] 05/22/2013 8:20 AM   Addendum  I have independently interviewed and examined the patient, and I agree with the physician assistant's findings.  Can start ambulating at this point.  Staples out in 2 weeks.    Leonides Sake, MD [facility] [place] Office: [phone] Pager: [phone]  05/22/2013, 4:26 PM

## 2013-05-22 NOTE — Evaluation (Signed)
Physical Therapy Evaluation Patient Details Name: Jack [name] MRN: [id number] DOB: 07-Jul-1946 Today's Date: 05/22/2013 Time: 0865-7846 PT Time Calculation (min): 33 min  PT Assessment / Plan / Recommendation History of Present Illness  47 [name] underwent TURP and L ureteral stent 8/29. Admitted to [facility] 9/02 with acute PE and anticoagulation initiated. Hospitalization c/b hematuria, progressive renal linsufficiency and anemia requiring transfusion. Bladder irrigations initiated and CT scan abdomen performed 9/03 revealed L renal fluid subcapsular fluid collection thought to represent hematoma. Transferred to [facility] 9/05 for impending need for HD and placement of IVC filter. Currently has a suprapubic catheter with CBI.  Pt with bil LE fasciotomies 10/1 and 10/4 with RLE closed 10/6 and LLE closed 10/8. Pt in bed 10/1-10/9  Clinical Impression  Jack [name] is a pleasant and determined gentleman with decreased mobility due to prolonged bedrest after bil LE fasciotomies. Mobility currently limited by weakness and pain but pt motivated and desired to walk by his birthday in 3 days. Will follow acutely to maximize strength, mobility, function and gait prior to discharge to decrease burden of care and return pt to PLOF.    PT Assessment  Patient needs continued PT services    Follow Up Recommendations  CIR;Supervision/Assistance - 24 hour    Does the patient have the potential to tolerate intense rehabilitation      Barriers to Discharge        Equipment Recommendations  3in1 (PT)    Recommendations for Other Services Rehab consult   Frequency Min 3X/week    Precautions / Restrictions Precautions Precautions: Fall   Pertinent Vitals/Pain 9/10 bil LE pain in standing HR 115-134 with activity sats 98% RA      Mobility  Bed Mobility Bed Mobility: Supine to Sit;Sitting - Scoot to Edge of Bed Supine to Sit: With rails;HOB elevated;4: Min assist Sitting - Scoot to Edge of Bed: 5:  Supervision Details for Bed Mobility Assistance: increased time to complete with reliance on rail and PT hand to elevate trunk from surface Transfers Transfers: Squat Pivot Transfers Sit to Stand: 3: Mod assist;From bed Stand to Sit: 3: Mod assist;To chair/3-in-1;To bed Squat Pivot Transfers: 3: Mod assist Details for Transfer Assistance: pt attempted standing x 3 but limited tolerance of weight on bil LE. Initial trial cleared buttock but immediately sat, stood again to attempt stand pivot but pt unable to advance feet once standing, 3rd time weight onto feet and performed squat pivot to chair with knees blocked and left foot prepositioned for pivot. Pt required bil LE to have positioned advanced once sitting Ambulation/Gait Ambulation/Gait Assistance: Not tested (comment) Stairs: No    Exercises General Exercises - Lower Extremity Ankle Circles/Pumps: AROM;Both;5 reps;Seated Short Arc Quad: AROM;Both;10 reps;Seated Heel Slides: AROM;Right;5 reps;Seated   PT Diagnosis: Difficulty walking;Acute pain;Generalized weakness  PT Problem List: Decreased strength;Decreased range of motion;Decreased activity tolerance;Decreased mobility;Pain;Obesity PT Treatment Interventions: Gait training;Functional mobility training;Therapeutic activities;Therapeutic exercise;Patient/family education;DME instruction     PT Goals(Current goals can be found in the care plan section) Acute Rehab PT Goals Patient Stated Goal: get back to gardening PT Goal Formulation: With patient Time For Goal Achievement: 06/05/13 Potential to Achieve Goals: Fair  Visit Information  Last PT Received On: 05/22/13 Assistance Needed: +2 History of Present Illness: Jack [name] underwent TURP and L ureteral stent 8/29. Admitted to [facility] 9/02 with acute PE and anticoagulation initiated. Hospitalization c/b hematuria, progressive renal linsufficiency and anemia requiring transfusion. Bladder irrigations initiated and CT scan  abdomen performed 9/03 revealed  L renal fluid subcapsular fluid collection thought to represent hematoma. Transferred to [facility] 9/05 for impending need for HD and placement of IVC filter. Currently has a suprapubic catheter with CBI.  Pt with bil LE fasciotomies 10/1 and 10/4 with RLE closed 10/6 and LLE closed 10/8. Pt in bed 10/1-10/9       Prior Functioning  Home Living Family/patient expects to be discharged to:: Private residence Living Arrangements: Spouse/significant other;Children Available Help at Discharge: Family;Available 24 hours/day Type of Home: Mobile home Home Access: Ramped entrance Home Layout: Multi-level Alternate Level Stairs-Number of Steps: 1 step to bedroom Alternate Level Stairs-Rails: None Home Equipment: Bedside commode;Walker - 2 wheels;Hand held shower head;Hospital bed (walking sticks) Prior Function Level of Independence: Independent with assistive device(s) (uses walking stick) Communication Communication: No difficulties Dominant Hand: Right    Cognition  Cognition Arousal/Alertness: Awake/alert Behavior During Therapy: WFL for tasks assessed/performed Overall Cognitive Status: Within Functional Limits for tasks assessed    Extremity/Trunk Assessment Upper Extremity Assessment Upper Extremity Assessment: Defer to OT evaluation Lower Extremity Assessment Lower Extremity Assessment: Generalized weakness RLE Deficits / Details: grossly 3-/5 with bil LE edema ROM limited by pain, dressing and edema unable to bend knees beyond grossly 70degrees LLE Deficits / Details: grossly 3-/5 with bil LE edema ROM limited by pain, dressing and edema unable to bend knees beyond grossly 70degrees Cervical / Trunk Assessment Cervical / Trunk Assessment: Normal   Balance Static Sitting Balance Static Sitting - Balance Support: No upper extremity supported;Feet supported Static Sitting - Level of Assistance: 6: Modified independent (Device/Increase time) Static Sitting -  Comment/# of Minutes: 8  End of Session PT - End of Session Equipment Utilized During Treatment: Gait belt Activity Tolerance: Patient limited by pain Patient left: in chair;with call bell/phone within reach;with nursing/sitter in room Nurse Communication: Mobility status  GP     Delorse Lek 05/22/2013, 9:48 AM Delaney Meigs, PT [phone]

## 2013-05-22 NOTE — Progress Notes (Signed)
ANTICOAGULATION CONSULT NOTE - Follow Up Consult  Pharmacy Consult for Heparin Indication: DVT  Allergies  Allergen Reactions  . Contrast Media [Iodinated Diagnostic Agents]     On 04/29/2013 spoke with patient he had some type of procedure at [facility] and broke out in hives after the exams in 1997, he did not know what of type of study he had.  I called [facility] Radiology department the only type of contrast they used during that time  was Conray    Patient Measurements: Height: 6' (182.9 cm) Weight: 269 lb 6.4 oz (122.2 kg) IBW/kg (Calculated) : 77.6 Heparin Dosing Weight: 104kg  Vital Signs: Temp: 98.3 F (36.8 C) (10/09 0800) Temp src: Oral (10/09 0800) BP: 113/52 mmHg (10/09 0800) Pulse Rate: 75 (10/09 0800)  Labs:  Recent Labs  05/20/13 0220  05/21/13 0615 05/21/13 2015 05/22/13 0555  HGB 7.4*  --  8.4*  --  8.5*  HCT 22.3*  --  26.1*  --  25.7*  PLT 227  --  244  --  247  HEPARINUNFRC  --   < > <0.10* 1.48* 1.10*  CREATININE 1.31  --  1.13  --   --   < > = values in this interval not displayed.  Estimated Creatinine Clearance: 86.8 ml/min (by C-G formula based on Cr of 1.13).  Medications:  Heparin @ 2200 units/hr  Assessment: 66yom with history of PE s/p IVC filter who developed extensive bilateral DVTs on 9/21 with concern for clot moving through the IVC filter. Heparin initiated. He underwent thrombolytic therapy with [facility] 9/28-9/29. On 10/1 there was concern of developing compartment syndrome in his right leg so he went to the OR for right leg fasciotomy.   He is now POD#8 right leg fasciotomy s/p closure 10/6 and POD#5 left leg fasciotomy s/p closure 10/8. Heparin resumed after OR yesterday Heparin level remains elevated this morning despite rate decrease last night. Drawn appropriately.   CBC is stable. No bleeding reported. Goal of Therapy:  Heparin level 0.3-0.7 units/ml Monitor platelets by anticoagulation protocol: Yes   Plan:   1) Decrease heparin to 2000 units/hr 2) Check 6 hour heparin level  Fredrik Rigger 05/22/2013,8:38 AM

## 2013-05-22 NOTE — Progress Notes (Signed)
ANTICOAGULATION CONSULT NOTE - Follow Up Consult  Pharmacy Consult for Heparin Indication: DVT  Allergies  Allergen Reactions  . Contrast Media [Iodinated Diagnostic Agents]     On 04/29/2013 spoke with patient he had some type of procedure at [facility] and broke out in hives after the exams in 1997, he did not know what of type of study he had.  I called [facility] Radiology department the only type of contrast they used during that time  was Conray   Patient Measurements: Height: 6' (182.9 cm) Weight: 269 lb 6.4 oz (122.2 kg) IBW/kg (Calculated) : 77.6 Heparin Dosing Weight: 104kg  Vital Signs: Temp: 99.6 F (37.6 C) (10/09 1519) Temp src: Oral (10/09 1519) BP: 113/52 mmHg (10/09 1519) Pulse Rate: 95 (10/09 1519)  Labs:  Recent Labs  05/20/13 0220  05/21/13 0615 05/21/13 2015 05/22/13 0555 05/22/13 1651  HGB 7.4*  --  8.4*  --  8.5*  --   HCT 22.3*  --  26.1*  --  25.7*  --   PLT 227  --  244  --  247  --   HEPARINUNFRC  --   < > <0.10* 1.48* 1.10* 0.77*  CREATININE 1.31  --  1.13  --   --   --   < > = values in this interval not displayed.  Estimated Creatinine Clearance: 86.8 ml/min (by C-G formula based on Cr of 1.13).  Medications:  Heparin @ 2200 units/hr  Assessment: 66yom with history of PE s/p IVC filter who developed extensive bilateral DVTs on 9/21 with concern for clot moving through the IVC filter. Heparin initiated. He underwent thrombolytic therapy with [facility] 9/28-9/29. On 10/1 there was concern of developing compartment syndrome in his right leg so he went to the OR for right leg fasciotomy.   He is now POD#8 right leg fasciotomy s/p closure 10/6 and POD#5 left leg fasciotomy s/p closure 10/8. Heparin resumed after OR yesterday.  Heparin level is just slightly above desired goal range of 0.3-0.7 at 0.77 IU/ml this afternoon.  Dose was decreased earlier today.  I spoke with the nurse who reports no noted bleeding.     Goal of Therapy:   Heparin level 0.3-0.7 units/ml Monitor platelets by anticoagulation protocol: Yes   Plan:  1) Decrease heparin to 1900 units/hr 2) Check am heparin level and CBC 3) Continue to monitor closely for bleeding with recent hematomas  Nadara Mustard, PharmD., MS Clinical Pharmacist Pager:  [phone] Thank you for allowing pharmacy to be part of this patients care team. 05/22/2013,6:34 PM

## 2013-05-22 NOTE — Evaluation (Signed)
Occupational Therapy Evaluation Patient Details Name: GLADYS GUTMAN MRN: [id number] DOB: 1946-04-28 Today's Date: 05/22/2013 Time: 7829-5621 OT Time Calculation (min): 26 min  OT Assessment / Plan / Recommendation History of present illness 73 M underwent TURP and L ureteral stent 8/29. Admitted to [facility] 9/02 with acute PE and anticoagulation initiated. Hospitalization c/b hematuria, progressive renal linsufficiency and anemia requiring transfusion. Bladder irrigations initiated and CT scan abdomen performed 9/03 revealed L renal fluid subcapsular fluid collection thought to represent hematoma. Transferred to [facility] 9/05 for impending need for HD and placement of IVC filter. Currently has a suprapubic catheter with CBI.  Pt with bil LE fasciotomies 10/1 and 10/4 with RLE closed 10/6 and LLE closed 10/8. Pt in bed 10/1-10/9   Clinical Impression   Pt. Admitted with above, and now with prolonged bedrest and immobilization.  Pt demonstrates generalized weakness, and bil. LE pain.  He is very motivated, and will benefit from continued OT to address deficits listed below to allow him to return to a supervision - min A level with BADLs after CIR.      OT Assessment  Patient needs continued OT Services    Follow Up Recommendations  CIR;Supervision/Assistance - 24 hour    Barriers to Discharge      Equipment Recommendations  None recommended by OT    Recommendations for Other Services Rehab consult  Frequency  Min 2X/week    Precautions / Restrictions Precautions Precautions: Fall Restrictions Weight Bearing Restrictions: No   Pertinent Vitals/Pain HR 134 with activity; 105 resting HR    ADL  Eating/Feeding: Independent Where Assessed - Eating/Feeding: Chair Grooming: Set up Where Assessed - Grooming: Unsupported sitting Upper Body Bathing: Set up;Supervision/safety Where Assessed - Upper Body Bathing: Supported sitting;Unsupported sitting Lower Body Bathing: Maximal  assistance Where Assessed - Lower Body Bathing: Supported sit to stand Upper Body Dressing: Set up;Supervision/safety Where Assessed - Upper Body Dressing: Supported sitting;Unsupported sitting Lower Body Dressing: +1 Total assistance Where Assessed - Lower Body Dressing: Supported sit to stand Toilet Transfer: Maximal assistance Toilet Transfer Method: Sit to stand;Stand pivot Acupuncturist: Other (comment) (bed pan in chair) Toileting - Clothing Manipulation and Hygiene: +1 Total assistance Where Assessed - Glass blower/designer Manipulation and Hygiene: Standing Equipment Used: Other (comment) (stedy) Transfers/Ambulation Related to ADLs: Mod A with stedy ADL Comments: Pt unable to access Bil. LEs due to pain and edema.   Pain with standing     OT Diagnosis: Generalized weakness;Acute pain  OT Problem List: Decreased strength;Decreased activity tolerance;Impaired balance (sitting and/or standing);Decreased knowledge of use of DME or AE;Obesity;Pain;Increased edema;Cardiopulmonary status limiting activity OT Treatment Interventions: Self-care/ADL training;Therapeutic exercise;DME and/or AE instruction;Therapeutic activities;Patient/family education;Balance training   OT Goals(Current goals can be found in the care plan section) Acute Rehab OT Goals Patient Stated Goal: To get better OT Goal Formulation: With patient Time For Goal Achievement: 06/05/13 Potential to Achieve Goals: Good ADL Goals Pt Will Perform Lower Body Bathing: with min assist;sit to/from stand Pt Will Perform Lower Body Dressing: with min assist;with adaptive equipment;sit to/from stand Pt Will Transfer to Toilet: with min assist;ambulating;regular height toilet;bedside commode;grab bars Pt Will Perform Toileting - Clothing Manipulation and hygiene: with min assist;sit to/from stand Pt/caregiver will Perform Home Exercise Program: Increased strength;Right Upper extremity;Left upper extremity;With  theraband  Visit Information  Last OT Received On: 05/22/13 Assistance Needed: +2 History of Present Illness: 62 M underwent TURP and L ureteral stent 8/29. Admitted to [facility] 9/02 with acute PE and anticoagulation initiated.  Hospitalization c/b hematuria, progressive renal linsufficiency and anemia requiring transfusion. Bladder irrigations initiated and CT scan abdomen performed 9/03 revealed L renal fluid subcapsular fluid collection thought to represent hematoma. Transferred to [facility] 9/05 for impending need for HD and placement of IVC filter. Currently has a suprapubic catheter with CBI.  Pt with bil LE fasciotomies 10/1 and 10/4 with RLE closed 10/6 and LLE closed 10/8. Pt in bed 10/1-10/9       Prior Functioning     Home Living Family/patient expects to be discharged to:: Private residence Living Arrangements: Spouse/significant other;Children Available Help at Discharge: Family;Available 24 hours/day Type of Home: Mobile home Home Access: Ramped entrance Home Layout: Multi-level Alternate Level Stairs-Number of Steps: 1 step to bedroom Alternate Level Stairs-Rails: None Home Equipment: Bedside commode;Walker - 2 wheels;Hand held shower head;Hospital bed;Tub bench (walking sticks) Prior Function Level of Independence: Independent with assistive device(s) Comments: independent prior to medical decline Communication Communication: No difficulties Dominant Hand: Right         Vision/Perception Vision - History Baseline Vision: No visual deficits   Cognition  Cognition Arousal/Alertness: Awake/alert Behavior During Therapy: WFL for tasks assessed/performed Overall Cognitive Status: Within Functional Limits for tasks assessed    Extremity/Trunk Assessment Upper Extremity Assessment Upper Extremity Assessment: Generalized weakness Lower Extremity Assessment Lower Extremity Assessment: Defer to PT evaluation Cervical / Trunk Assessment Cervical / Trunk Assessment:  Normal     Mobility Bed Mobility Bed Mobility: Not assessed Transfers Transfers: Sit to Stand;Stand to Sit Sit to Stand: 3: Mod assist;With upper extremity assist;From chair/3-in-1 Stand to Sit: 3: Mod assist;With upper extremity assist;To chair/3-in-1 Details for Transfer Assistance: Pt stood x 3 using Stedy.  Pt with difficulty achieving full hip extension due to pain Bil. LEs when standing.  Maintains standing x ~1-1.5 mins     Exercise     Balance Balance Balance Assessed: Yes Static Standing Balance Static Standing - Balance Support: Bilateral upper extremity supported Static Standing - Level of Assistance: 3: Mod assist Static Standing - Comment/# of Minutes: 1-1.5 mins while performing peri care   End of Session OT - End of Session Equipment Utilized During Treatment: Other (comment) (stedy) Activity Tolerance: Patient tolerated treatment well Patient left: in chair;with call bell/phone within reach Nurse Communication: Mobility status  GO     Meleane Selinger M 05/22/2013, 3:35 PM

## 2013-05-22 NOTE — Progress Notes (Signed)
[facility] Progress Note [place] TEAM 1 - Stepdown/ICU TEAM  Jack [name] VHQ:[id number] DOB: 21-Oct-1945 DOA: 04/30/2013 PCP: Simone Curia, MD  HPI / Hospital Course: 67 y.o. male with complicated medical history including a recent stay at [facility] for PE S/P TURP who required subsequent bilateral renal stents for obstruction due to hemorrhagic complications of anticoagulation s/p eventual IVC filter. Patient was seen in the ED at [name] 9/17 where his work up demonstrated creatinine of 6.0, WBC of 37k, potassium of 5.8, sodium 127, tachycardia, and mild hypotension. VQ scan was repeated which was negative for PE. He was given kayexelate in the ED, 2L NS, and transferred to [facility].   8/29 - TURP w/ L ureteral stent [facility]  9/02 - admit [facility] w/ V/Q "high probability" for PE >> anticoag >> severe hematuria + anemia + renal failure  9/3 - CT abdom reveals large L renal capsular hematoma  9/5 - transferred to [facility] to PCCM service - noted to have large clot in bladder w/ B hydronephrosis  9/5 - IVC filter placement  9/6 - B ureteral stents placed - open evacuation of clot from bladder- suprapubic tube placed  9/7- venous duplex lower extremity - NO DVT  9/21-venous duplex lower extremity- Bilateral: Positive for DVT in the common femoral, profunda, femoral, popliteal, and posterior tibial veins with superficial thrombus in the greater and lesser saphenous veins.  9/22 - IV heparin initiated for DVTs (anticoagulation had been on hold due to renal hematoma)  9/28: Decision was made to perform a catheter directed lysis of clot and patient was transferred to the ICU for monitoring and further care while on lytics. 9/28-Bilateral popliteal venous acc with initiation of bilateral LE and IVC thrombolysis, popliteal sheaths  9/29- IR inferior venacavagram, thrombolysis/thrombectomy bilateral lower extremities showing improved patency, IVC patent. Advised d/c lytic therapy,  continue heparin per pharmacy  9/30 - CT abd/pelvis showed stable L renal hematoma  10/1- Acute hematoma rt leg below knee, compartment syndrome  10/1- calf fasciotomy performed 10/4 LEFT calf fasciotomy performed 10/6 taken back to OR to close RIGHT fasciotomy 10/8 to OR to close LEFT fasciotomy  Assessment/Plan:  B LE compartment syndrome due to bleeding/hematoma S/p B fasciotomies - Vasc Surgery following - appears to be improving nicely   C diff colitis started flagyl 10/3 - diarrhea appears to be slowing already   Acute renal failure/ CKD 3  crt was 1.85 at time of d/c 9/10 - Nephrology has signed off - ultrasound and Ct abd/pelvis revealed no acute findings - most likely simple pre-renal state v/s ATN related to hypotension - crt has normalized   Bilateral lower extremity DVT / PE  status post IVC filter 9/5 - given recent severe complications related to bleeding anticoagulation was not felt to be wise at the time of admission - further review of records revealed Dopplers from 9/7 which were negative for DVT - repeat dopplers this hospitalization confirmed extensive bilateral lower extremity DVTs - d/c'ed testosterone replacement - due to fear of propagation of clot through IVC filter heparin was subsequently intitiated for anticoag w/ closer monitoring - underwent thrombolysis by IR - was in ICU - then developed compartment syndrome and was taken to OR on 10/1- wound vac placed and then to be taken back to OR on 10/6 on right leg to close - left leg had fasciotomy on 10/4 with closure 10/8 - on heparin   Progressive anemia  Likely a combination of significant recent acute loss  plus decreased production - transfused multiple units of PRBC during this stay - Hgb improved today - follow in serial fashion  Hemorrhoidal GI bleed  -bright red blood streaking in stools 9/25 - appears to be from hemorrhoids - no further GI bleeding since 10/3- mucous stools   S/p transurethral resection of  prostate with left ureteroscopic stone manipulation 04/13/13 complicated by perinephric hematoma with bilateral hydronephrosis  underwent urgent cystoscopy 04/18/2013 with bilateral stent placement and bladder evacuation with suprapubic tube placement - Urology has seen in f/u  Hypotension -  BP has now stabilized  Hyperkalemia resolved  Code Status: FULL Family Communication: patient and wife at bedside  Disposition Plan: SDU   Consultants:  Nephrology  Vascular Surgery   IR  Urology  ANTIBIOTICS:  Flagyl 10/3 >>  HPI/Subjective: Pt remains very optimistic and excited since he can raise both legs which he has not able to do (esp the right leg).  Denies cp, f/c, sob, n/v, or abdom pain.    Objective: Filed Vitals:   05/22/13 1519  BP: 113/52  Pulse: 95  Temp:   Resp: 25    Intake/Output Summary (Last 24 hours) at 05/22/13 1536 Last data filed at 05/22/13 1500  Gross per 24 hour  Intake 1971.27 ml  Output   3013 ml  Net -1041.73 ml   Filed Weights   05/20/13 0434 05/21/13 0421 05/22/13 0400  Weight: 121.3 kg (267 lb 6.7 oz) 118.6 kg (261 lb 7.5 oz) 122.2 kg (269 lb 6.4 oz)    Exam: General: No acute respiratory distress Lungs: Clear to auscultation bilaterally without wheezes or crackles Cardiovascular: Regular rate and rhythm without murmur gallop or rub normal S1 and S2 Abdomen: Nontender, nondistended, soft, bowel sounds positive, no rebound, no ascites, no appreciable mass Extremities: B le dressed in ACE bandages - no evidence of bleeding - able to lift both legs off bed - moves toes B feet - intact to sensation B feet  Data Reviewed: Basic Metabolic Panel:  Recent Labs Lab 05/18/13 0544 05/18/13 1838 05/19/13 0230 05/20/13 0220 05/21/13 0615  NA 130* 127* 129* 130* 131*  K 5.9* 4.5 4.5 4.5 4.4  CL 97 93* 96 101 102  CO2 23 23 22 22 23   GLUCOSE 126* 138* 121* 112* 103*  BUN 45* 42* 37* 26* 17  CREATININE 2.12* 1.69* 1.48* 1.31 1.13  CALCIUM  7.5* 7.0* 6.9* 6.8* 7.4*   Liver Function Tests: No results found for this basename: AST, ALT, ALKPHOS, BILITOT, PROT, ALBUMIN,  in the last 168 hours  CBC:  Recent Labs Lab 05/19/13 0230 05/19/13 1400 05/20/13 0220 05/21/13 0615 05/22/13 0555  WBC 11.5* 8.9 9.1 7.6 6.1  HGB 8.1* 7.7* 7.4* 8.4* 8.5*  HCT 23.7* 22.9* 22.3* 26.1* 25.7*  MCV 84.6 86.7 87.5 88.5 88.0  PLT 221 224 227 244 247   Cardiac Enzymes:  Recent Labs Lab 05/17/13 1230  CKTOTAL 373*   BNP (last 3 results)  Recent Labs  04/18/13 1245 04/27/13 1240  PROBNP 1762.0* 1260.0*   CBG:  Recent Labs Lab 05/18/13 1658 05/19/13 0910 05/19/13 1237 05/19/13 1656  GLUCAP 139* 126* 111* 118*    Recent Results (from the past 240 hour(s))  CLOSTRIDIUM DIFFICILE BY PCR     Status: Abnormal   Collection Time    05/16/13  2:34 AM      Result Value Range Status   C difficile by pcr POSITIVE (*) NEGATIVE Final   Comment: CRITICAL RESULT CALLED TO, READ BACK  BY AND VERIFIED WITH:     Pennelope Bracken 05/16/13 1120 BY K SCHULTZ  MRSA PCR SCREENING     Status: None   Collection Time    05/16/13  3:26 PM      Result Value Range Status   MRSA by PCR NEGATIVE  NEGATIVE Final   Comment:            The GeneXpert MRSA Assay (FDA     approved for NASAL specimens     only), is one component of a     comprehensive MRSA colonization     surveillance program. It is not     intended to diagnose MRSA     infection nor to guide or     monitor treatment for     MRSA infections.     Studies: No results found.  Scheduled Meds: . hydrocortisone   Rectal BID  . metroNIDAZOLE  500 mg Oral Q8H  . sodium chloride  3 mL Intravenous Q12H   Time spent: 35 mins  [facility], MD  [facility] Office  [phone] Pager [phone]  On-Call/Text Page:      [email or web address]      password [facility]  05/22/2013, 3:36 PM  LOS: 22 days

## 2013-05-22 NOTE — Consult Note (Signed)
Physical Medicine and Rehabilitation Consult  Reason for Consult: Deconditioning Referring Physician:  Dr. Butler Denmark   HPI: Jack [name] is a 67 y.o. male with complicated medical history including a recent stay at [facility] for PE S/P TURP 8/29 who required subsequent bilateral renal stents for obstruction due to hemorrhagic complications of anticoagulation.Pt was seen at [facility] 9/2 for V/Q showing likely PE. PT had CT showing L renal hematoma.9/5 - transferred to [facility] to PCCM service - noted to have large clot in bladder w/ B hydronephrosis 9/5 - IVC filter placement. In cone he demonstrated bilateral large clot burden DVT in lower ext and IVC was clotted. Decision was made to perform a catheter directed lysis of clot. On 05/14/13 he developed acute hematoma RLL with compartment syndrome requiring four compartment fasciotomy with placement of VAC and IV heparin resumed later that day.  laplaced . He developed C-diff colitis and on flagyl for treatment.  On 10/03, he developed hypotension with drop in H/H as well as pain in left leg due to compartment syndrome on 10/04. He was taken to OR for fasciotomy with evacuation of large amount of clear fluid in posterior deep compartment LLE. Dopplers 10/05 with subacute non-occlusive DVT in bilateral femoral, left popliteal and right popliteal and posterior tibial veins. He underwent closures of right fasciotomy on 10/6 and left on 10/08. He continues to be limited by pain BLE. PT evaluation done today and CIR recommended due to deconditioned state.   Patient reports being up in chair for 3 hours today. He did not take any pain medicine before standing and he had some foot pain with standing. Complaints of tingling in both feet  Review of Systems  Eyes: Negative for blurred vision and double vision.  Respiratory: Negative for shortness of breath.   Cardiovascular: Negative for chest pain and palpitations.  Gastrointestinal: Negative for heartburn and  nausea.  Musculoskeletal: Positive for myalgias.       LLE weakness due to endstage OA left knee.   Neurological: Positive for sensory change and weakness.   Past Medical History  Diagnosis Date  . HTN (hypertension)   . Ureteral stone   . Colon cancer   . Shortness of breath   . Asthma   . Chronic kidney disease     acuts on chronic renal failure  . Arthritis   . PE (pulmonary embolism) 04/2013   Past Surgical History  Procedure Laterality Date  . Ureteral stent placement  04/10/2013  . Transurethral resection of prostate  04/10/2013  . Right colectomy  1996  . Tonsillectomy    . Cystoscopy with stent placement Bilateral 04/19/2013    Procedure: CYSTOSCOPY WITH STENT PLACEMENT;  Surgeon: Anner Crete, MD;  Location: [facility] OR;  Service: Urology;  Laterality: Bilateral;  . Cystoscopy N/A 04/19/2013    Procedure: CYSTOSCOPY WITH CLOT EVACUATION ;  Surgeon: Anner Crete, MD;  Location: [facility] OR;  Service: Urology;  Laterality: N/A;  . Laparotomy N/A 04/19/2013    Procedure: EXPLORATORY LAPAROTOMY;  Surgeon: Anner Crete, MD;  Location: [facility] OR;  Service: Urology;  Laterality: N/A;  Exploratory Laparotomy with evacuation of blood clot in bladder with placement of super pubic tube   . Fasciotomy Right 05/14/2013    Procedure: FASCIOTOMY with wound vac placement;  Surgeon: Fransisco Hertz, MD;  Location: [facility] OR;  Service: Vascular;  Laterality: Right;  . Application of wound vac Right 05/14/2013    Procedure: APPLICATION OF WOUND VAC;  Surgeon: Ottie Glazier  Imogene Burn, MD;  Location: [facility] OR;  Service: Vascular;  Laterality: Right;  . Fasciotomy Left 05/17/2013    Procedure: FASCIOTOMY LEG;  Surgeon: Sherren Kerns, MD;  Location: [facility] OR;  Service: Vascular;  Laterality: Left;   Family History  Problem Relation Age of Onset  . Urolithiasis    . Deep vein thrombosis Mother   . Heart disease Sister    Social History:  reports that he has never smoked. His smokeless tobacco use includes Chew. He reports that he drinks  alcohol. He reports that he does not use illicit drugs. Allergies:  Allergies  Allergen Reactions  . Contrast Media [Iodinated Diagnostic Agents]     On 04/29/2013 spoke with patient he had some type of procedure at [facility] and broke out in hives after the exams in 1997, he did not know what of type of study he had.  I called [facility] Radiology department the only type of contrast they used during that time  was Conray   Medications Prior to Admission  Medication Sig Dispense Refill  . ALPRAZolam (XANAX) 0.25 MG tablet Take 1 tablet (0.25 mg total) by mouth 3 (three) times daily as needed for anxiety.  21 tablet  0  . docusate sodium (COLACE) 100 MG capsule Take 100 mg by mouth 3 (three) times daily.      Marland Kitchen oxyCODONE (OXY IR/ROXICODONE) 5 MG immediate release tablet Take 5 mg by mouth every 4 (four) hours as needed for pain.      . Testosterone (ANDROGEL PUMP) 20.25 MG/ACT (1.62%) GEL Place 1 Act onto the skin daily.        Home: Home Living Family/patient expects to be discharged to:: Private residence Living Arrangements: Spouse/significant other;Children Available Help at Discharge: Family;Available 24 hours/day Type of Home: Mobile home Home Access: Ramped entrance Home Layout: Multi-level Alternate Level Stairs-Number of Steps: 1 step to bedroom Alternate Level Stairs-Rails: None Home Equipment: Bedside commode;Walker - 2 wheels;Hand held shower head;Hospital bed (walking sticks)  Functional History:   Functional Status:  Mobility: Bed Mobility Bed Mobility: Supine to Sit;Sitting - Scoot to Edge of Bed Rolling Right: 5: Supervision Rolling Left: 6: Modified independent (Device/Increase time);With rail Right Sidelying to Sit: 5: Supervision;HOB elevated Left Sidelying to Sit: 6: Modified independent (Device/Increase time);With rails;HOB flat Supine to Sit: With rails;HOB elevated;4: Min assist Sitting - Scoot to Edge of Bed: 5:  Supervision Transfers Transfers: Squat Pivot Transfers Sit to Stand: 3: Mod assist;From bed Stand to Sit: 3: Mod assist;To chair/3-in-1;To bed Stand Pivot Transfers: Not tested (comment) Squat Pivot Transfers: 3: Mod assist Ambulation/Gait Ambulation/Gait Assistance: Not tested (comment) Ambulation Distance (Feet): 145 Feet (80 feet then 30 feet and then 35 feet with seated rest break) Assistive device: Rolling walker Ambulation/Gait Assistance Details: cues for body position in RW and correct RW placement.  DOE 3/4 with need for 2L O2 to keep sats >90%.   Gait Pattern: Step-through pattern;Antalgic;Trunk flexed;Wide base of support Gait velocity: slow General Gait Details: Pt continues to have SOB during ambulation  Stairs: No Wheelchair Mobility Wheelchair Mobility: No  ADL: ADL Grooming: Set up Where Assessed - Grooming: Unsupported sitting Upper Body Bathing: Set up;Supervision/safety Where Assessed - Upper Body Bathing: Supported sitting Lower Body Bathing: Minimal assistance;Other (comment) (using AE) Where Assessed - Lower Body Bathing: Supported sit to stand Upper Body Dressing: Supervision/safety;Modified independent Where Assessed - Upper Body Dressing: Supported sitting Lower Body Dressing: Minimal assistance Where Assessed - Lower Body Dressing: Supported sit to stand (using  AE) Toilet Transfer: Minimal assistance Toilet Transfer Method: Stand pivot Toilet Transfer Equipment: Bedside commode Equipment Used: Rolling walker;Sock aid;Reacher;Long-handled sponge Transfers/Ambulation Related to ADLs: min A sit - stand.  ADL Comments: AE significantl;y  Cognition: Cognition Overall Cognitive Status: Within Functional Limits for tasks assessed Orientation Level: Oriented X4 Cognition Arousal/Alertness: Awake/alert Behavior During Therapy: WFL for tasks assessed/performed Overall Cognitive Status: Within Functional Limits for tasks assessed  Blood pressure 113/52,  pulse 114, temperature 98.3 F (36.8 C), temperature source Oral, resp. rate 21, height 6' (1.829 m), weight 122.2 kg (269 lb 6.4 oz), SpO2 98.00%. Physical Exam  Nursing note and vitals reviewed. Constitutional: He is oriented to person, place, and time. He appears well-developed and well-nourished.  HENT:  Head: Normocephalic and atraumatic.  Eyes: Conjunctivae are normal. Pupils are equal, round, and reactive to light.  Neck: Normal range of motion. Neck supple.  Cardiovascular: Normal rate and regular rhythm.   GI: Soft. Bowel sounds are normal. He exhibits no distension. There is no tenderness.  SP in place with dry dressing--foley bag with bloody urine.   Musculoskeletal:  BLE with compressive dressing--edematous LLE>RLE. Decreased sensation bilateral feet.   Neurological: He is alert and oriented to person, place, and time.  Skin: Skin is warm and dry.  Psychiatric: He has a normal mood and affect. His behavior is normal. Judgment and thought content normal.   sensory: Paresthesias dorsum of both feet, proprioception intact, light touch intact Motor 5/5 bilateral deltoid, bicep, tricep, grip Right hip flexion 3 minus left hip flexion 4 Left knee extension 4, right knee extension 3 Bilateral ankle dorsiflexors plantar flexors as well as total flexors and extensors for 3-/5  Results for orders placed during the hospital encounter of 04/30/13 (from the past 24 hour(s))  HEPARIN LEVEL (UNFRACTIONATED)     Status: Abnormal   Collection Time    05/21/13  8:15 PM      Result Value Range   Heparin Unfractionated 1.48 (*) 0.30 - 0.70 IU/mL  CBC     Status: Abnormal   Collection Time    05/22/13  5:55 AM      Result Value Range   WBC 6.1  4.0 - 10.5 K/uL   RBC 2.92 (*) 4.22 - 5.81 MIL/uL   Hemoglobin 8.5 (*) 13.0 - 17.0 g/dL   HCT 16.1 (*) 09.6 - 04.5 %   MCV 88.0  78.0 - 100.0 fL   MCH 29.1  26.0 - 34.0 pg   MCHC 33.1  30.0 - 36.0 g/dL   RDW 40.9 (*) 81.1 - 91.4 %   Platelets  247  150 - 400 K/uL  HEPARIN LEVEL (UNFRACTIONATED)     Status: Abnormal   Collection Time    05/22/13  5:55 AM      Result Value Range   Heparin Unfractionated 1.10 (*) 0.30 - 0.70 IU/mL   No results found.  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 Does the need for close, 24 hr/day medical supervision in concert with the patient's rehab needs make it unreasonable for this patient to be served in a less intensive setting? Yes 2. 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 Due to bladder management, bowel management, safety, skin/wound care, disease management, medication administration, pain management and patient education, does the patient require 24 hr/day rehab nursing? Yes 4. Does the patient require coordinated care of a  physician, rehab nurse, PT (1-2 hrs/day, 5 days/week) and OT (1-2 hrs/day, 5 days/week) to address physical and functional deficits in the context of the above medical diagnosis(es)? Yes Addressing deficits in the following areas: balance, endurance, locomotion, strength, transferring, bowel/bladder control, bathing, dressing, feeding, grooming and toileting 5. Can the patient actively participate in an intensive therapy program of at least 3 hrs of therapy per day at least 5 days per week? Yes 6. The potential for patient to make measurable gains while on inpatient rehab is excellent 7. Anticipated functional outcomes upon discharge from inpatient rehab are supervision mobility with PT, supervision ADLs with OT, not applicable with SLP. 8. Estimated rehab length of stay to reach the above functional goals is: 1-2 weeks 9. Does the patient have adequate social supports to accommodate these discharge functional goals? Yes 10. Anticipated D/C setting:  Home 11. Anticipated post D/C treatments: HH therapy 12. Overall Rehab/Functional Prognosis: excellent  RECOMMENDATIONS: This patient's condition is appropriate for continued rehabilitative care in the following setting: CIR Patient has agreed to participate in recommended program. Yes Note that insurance prior authorization may be required for reimbursement for recommended care.  Comment: Patient is very motivated    05/22/2013

## 2013-05-23 ENCOUNTER — Encounter (HOSPITAL_COMMUNITY): Payer: Self-pay | Admitting: Vascular Surgery

## 2013-05-23 LAB — FERRITIN: Ferritin: 1155 ng/mL — ABNORMAL HIGH (ref 22–322)

## 2013-05-23 LAB — IRON AND TIBC
Saturation Ratios: 28 % (ref 20–55)
TIBC: 145 ug/dL — ABNORMAL LOW (ref 215–435)
UIBC: 105 ug/dL — ABNORMAL LOW (ref 125–400)

## 2013-05-23 LAB — CBC
Hemoglobin: 8.3 g/dL — ABNORMAL LOW (ref 13.0–17.0)
MCH: 28.7 pg (ref 26.0–34.0)
MCHC: 32.5 g/dL (ref 30.0–36.0)
MCV: 88.2 fL (ref 78.0–100.0)
Platelets: 274 10*3/uL (ref 150–400)

## 2013-05-23 LAB — HEPARIN LEVEL (UNFRACTIONATED): Heparin Unfractionated: 0.8 IU/mL — ABNORMAL HIGH (ref 0.30–0.70)

## 2013-05-23 LAB — RETICULOCYTES
Retic Count, Absolute: 242.4 10*3/uL — ABNORMAL HIGH (ref 19.0–186.0)
Retic Ct Pct: 8 % — ABNORMAL HIGH (ref 0.4–3.1)

## 2013-05-23 MED ORDER — SODIUM CHLORIDE 0.9 % IV SOLN
INTRAVENOUS | Status: DC
  Administered 2013-05-23 – 2013-05-27 (×7): via INTRAVENOUS
  Administered 2013-05-27: 100 mL via INTRAVENOUS

## 2013-05-23 MED ORDER — RIVAROXABAN 20 MG PO TABS: 20.0000 mg | ORAL_TABLET | Freq: Every day | ORAL | Status: DC

## 2013-05-23 MED ORDER — RIVAROXABAN 15 MG PO TABS
15.0000 mg | ORAL_TABLET | Freq: Two times a day (BID) | ORAL | Status: DC
  Administered 2013-05-23 (×2): 15 mg via ORAL
  Filled 2013-05-23 (×3): qty 1

## 2013-05-23 MED ORDER — RIVAROXABAN 20 MG PO TABS
20.0000 mg | ORAL_TABLET | Freq: Every day | ORAL | Status: DC
  Filled 2013-05-23: qty 1

## 2013-05-23 NOTE — Progress Notes (Signed)
Pt arrived from [facility] with wife and belongings. Alert and oriented. No complaints of pain. Bilateral lower leg incisions draining serosanguinous drainage, but open to air. Suprapubic catheter capped. VSS. Oriented pt to unit. Bed in low position. Call bell within reach. Will continue to monitor.

## 2013-05-23 NOTE — Clinical Social Work Placement (Addendum)
Clinical Social Work Department CLINICAL SOCIAL WORK PLACEMENT NOTE 05/23/2013  Patient:  Jack [name], Jack [name]  Account Number:  [id number] Admit date:  04/30/2013  Clinical Social Worker:  Maryclare Labrador, Theresia Majors  Date/time:  05/23/2013 10:13 AM  Clinical Social Work is seeking post-discharge placement for this patient at the following level of care:   SKILLED NURSING   (*CSW will update this form in Epic as items are completed)   05/23/2013  Patient/family provided with [facility] Department of Clinical Social Work's list of facilities offering this level of care within the geographic area requested by the patient (or if unable, by the patient's family).  05/23/2013  Patient/family informed of their freedom to choose among providers that offer the needed level of care, that participate in Medicare, Medicaid or managed care program needed by the patient, have an available bed and are willing to accept the patient.  05/23/2013  Patient/family informed of [facility]' ownership interest in [facility], as well as of the fact that they are under no obligation to receive care at this facility.  PASARR submitted to EDS on 05/23/2013 PASARR number received from EDS on 05/23/2013  FL2 transmitted to all facilities in geographic area requested by pt/family on  05/23/2013 FL2 transmitted to all facilities within larger geographic area on   Patient informed that his/her managed care company has contracts with or will negotiate with  certain facilities, including the following:     Patient/family informed of bed offers received:  05/23/2013 Patient chooses bed at [facility] in [facility] Physician recommends and patient chooses bed at    Patient to be transferred to [facility] in [place] on 05/29/13 Patient to be transferred to facility by ambulance.   The following physician request were entered in Epic:  Additional Comments: 05/27/13 - CSW talked with patient and  wife regarding facility choices and patient chose [facility] in [place]. CSW contacted facility and spoke with Wilkie Aye in admissions regarding patient choosing their facility. She will submit clinicals to [employer]. CSW sent updated therapy notes.  Alvina Chou, LCSW     Maryclare Labrador, MSW, [facility] Clinical Social Worker [phone]

## 2013-05-23 NOTE — Progress Notes (Signed)
Unclamped suprapubic catheter and hooked up foley drain bag per MD order.  Red urine return.

## 2013-05-23 NOTE — Progress Notes (Signed)
Occupational Therapy Treatment Patient Details Name: WILKIE ZENON MRN: [id number] DOB: 01/29/1946 Today's Date: 05/23/2013 Time: 5621-3086 OT Time Calculation (min): 28 min  OT Assessment / Plan / Recommendation  History of present illness 59 M underwent TURP and L ureteral stent 8/29. Admitted to [facility] 9/02 with acute PE and anticoagulation initiated. Hospitalization c/b hematuria, progressive renal linsufficiency and anemia requiring transfusion. Bladder irrigations initiated and CT scan abdomen performed 9/03 revealed L renal fluid subcapsular fluid collection thought to represent hematoma. Transferred to [facility] 9/05 for impending need for HD and placement of IVC filter. Currently has a suprapubic catheter with CBI.  Pt with bil LE fasciotomies 10/1 and 10/4 with RLE closed 10/6 and LLE closed 10/8. Pt in bed 10/1-10/9   OT comments  Pt very motivated, and is improving with functional mobility as needed for LB ADLs.  Continue to feel best venue post discharge is CIR as he would likely be able to transition to home with wife in 2-3 weeks with intensive therapies; however, per chart, Insurance has denied CIR and plan is now for SNF level rehab.   Follow Up Recommendations  CIR;Supervision/Assistance - 24 hour;Other (comment) (however, insurance has denied, now SNF)    Barriers to Discharge       Equipment Recommendations  None recommended by OT    Recommendations for Other Services    Frequency Min 2X/week   Progress towards OT Goals Progress towards OT goals: Progressing toward goals  Plan Discharge plan remains appropriate    Precautions / Restrictions Precautions Precautions: Fall   Pertinent Vitals/Pain     ADL  Lower Body Bathing: Maximal assistance Where Assessed - Lower Body Bathing: Supported sit to stand Toilet Transfer: Moderate assistance Toilet Transfer Method: Sit to stand Toileting - Clothing Manipulation and Hygiene: +1 Total assistance Where Assessed -  Toileting Clothing Manipulation and Hygiene: Standing Equipment Used: Other (comment) (Stedy) Transfers/Ambulation Related to ADLs: Min A sit to stand with Stedy.  Unable to extend hips fully due to pain ADL Comments: Pt stood x 5 with min A using stedy, in prep for LB ADLs and functional transfers    OT Diagnosis:    OT Problem List:   OT Treatment Interventions:     OT Goals(current goals can now be found in the care plan section) Acute Rehab OT Goals Time For Goal Achievement: 06/05/13 Potential to Achieve Goals: Good ADL Goals Pt Will Perform Lower Body Bathing: with min assist;sit to/from stand Pt Will Perform Lower Body Dressing: with min assist;with adaptive equipment;sit to/from stand Pt Will Transfer to Toilet: with min assist;ambulating;regular height toilet;bedside commode;grab bars Pt Will Perform Toileting - Clothing Manipulation and hygiene: with min assist;sit to/from stand Pt/caregiver will Perform Home Exercise Program: Increased strength;Right Upper extremity;Left upper extremity;With theraband  Visit Information  Last OT Received On: 05/23/13 Assistance Needed: +2 History of Present Illness: 82 M underwent TURP and L ureteral stent 8/29. Admitted to [facility] 9/02 with acute PE and anticoagulation initiated. Hospitalization c/b hematuria, progressive renal linsufficiency and anemia requiring transfusion. Bladder irrigations initiated and CT scan abdomen performed 9/03 revealed L renal fluid subcapsular fluid collection thought to represent hematoma. Transferred to [facility] 9/05 for impending need for HD and placement of IVC filter. Currently has a suprapubic catheter with CBI.  Pt with bil LE fasciotomies 10/1 and 10/4 with RLE closed 10/6 and LLE closed 10/8. Pt in bed 10/1-10/9    Subjective Data      Prior Functioning  Cognition  Cognition Arousal/Alertness: Awake/alert Behavior During Therapy: WFL for tasks assessed/performed Overall Cognitive Status:  Within Functional Limits for tasks assessed    Mobility  Bed Mobility Bed Mobility: Not assessed Supine to Sit: 4: Min guard;With rails;HOB elevated (HOB 15degrees) Sitting - Scoot to Edge of Bed: 5: Supervision Details for Bed Mobility Assistance: cueing for use of rail and reciprocal scooting Transfers Transfers: Sit to Stand;Stand to Sit Sit to Stand: 4: Min assist;With upper extremity assist;From chair/3-in-1 Stand to Sit: 4: Min assist;With upper extremity assist;To chair/3-in-1 Transfer via Lift Equipment: Stedy Details for Transfer Assistance: Pt able to tolerate standing 15-30 seconds x 5 trials.  Pt unable to achieve full hip extension due to pain     Exercises  General Exercises - Lower Extremity Long Arc Quad: AROM;Seated;Both;20 reps Hip Flexion/Marching: AROM;Both;20 reps;Seated Toe Raises: AROM;Both;20 reps;Seated Heel Raises: AROM;10 reps;20 reps;Right;Left (10x on LLE)   Balance Static Sitting Balance Static Sitting - Balance Support: No upper extremity supported;Feet supported Static Sitting - Level of Assistance: 6: Modified independent (Device/Increase time) Static Sitting - Comment/# of Minutes: 4   End of Session OT - End of Session Equipment Utilized During Treatment: Other (comment) Antony Salmon) Activity Tolerance: Patient tolerated treatment well Patient left: in chair;with call bell/phone within reach;with family/visitor present Nurse Communication: Mobility status  GO     Lacharles Altschuler M 05/23/2013, 1:10 PM

## 2013-05-23 NOTE — Progress Notes (Signed)
Pt voided into the urinal. The urine was very dark red, bloody, no visible clots. Pt states, "this looks worse than earlier when I peed". MD notified. Orders received.

## 2013-05-23 NOTE — Progress Notes (Signed)
I spoke with pt and his wife at bedside. His insurance will not approve an inpt rehab admission at this time. Recommend SNF and pt is in agreement.I discussed with SW and RN [phone]

## 2013-05-23 NOTE — Progress Notes (Addendum)
ANTICOAGULATION CONSULT NOTE - Follow Up Consult  Pharmacy Consult for Heparin Indication: DVT  Allergies  Allergen Reactions  . Contrast Media [Iodinated Diagnostic Agents]     On 04/29/2013 spoke with patient he had some type of procedure at [facility] and broke out in hives after the exams in 1997, he did not know what of type of study he had.  I called [facility] Radiology department the only type of contrast they used during that time  was Conray   Patient Measurements: Height: 6' (182.9 cm) Weight: 269 lb 6.4 oz (122.2 kg) IBW/kg (Calculated) : 77.6 Heparin Dosing Weight: 104kg  Vital Signs: Temp: 98.2 F (36.8 C) (10/10 1200) Temp src: Oral (10/10 1200) BP: 131/71 mmHg (10/10 1200) Pulse Rate: 83 (10/10 0741)  Labs:  Recent Labs  05/21/13 0615  05/22/13 0555 05/22/13 1651 05/23/13 0555  HGB 8.4*  --  8.5*  --  8.3*  HCT 26.1*  --  25.7*  --  25.5*  PLT 244  --  247  --  274  HEPARINUNFRC <0.10*  < > 1.10* 0.77* 0.80*  CREATININE 1.13  --   --   --   --   < > = values in this interval not displayed.  Estimated Creatinine Clearance: 86.8 ml/min (by C-G formula based on Cr of 1.13).    Assessment: 66yom with history of PE s/p IVC filter who developed extensive bilateral DVTs on 9/21 with concern for clot moving through the IVC filter. He underwent thrombolytic therapy with [facility] 9/28-9/29. On 10/1 there was concern of developing compartment syndrome in his right leg so he went to the OR for right leg fasciotomy. Patient now s/p fasciotomy closure (10/8). Plans are to stop heparin and begin Xarelto. Hg= 5.8 on 10/4 and todays Hg= 8.3 and has been stable in the last few days  Goal of Therapy:  Heparin level 0.3-0.7 units/ml Monitor platelets by anticoagulation protocol: Yes   Plan:  -Xarelto 15mg  bid for 21 days then 20mg  po daily (start 06/13/13) -Educated patient about Xarelto -Will follow patient progress -With recent fasciotomy consider lovenox  (watching Hg closley) while inpatient followed by Xarelto at discharge   Harland German, Pharm D 05/23/2013 12:10 PM

## 2013-05-23 NOTE — Clinical Social Work Note (Signed)
CSW informed pt that CSW has not received an answer on bed availability from pt's first choices [facility] and [facility]), but that he does have a bed offer from [facility]. Pt asked that CSW contact this facility and explain that he is agreeable to coming there at discharge as a backup. CSW has contacted facility and communicated this information to [facility] admissions coordinator Clydie Braun [phone]).   Maryclare Labrador, MSW, [facility] Clinical Social Worker [phone]

## 2013-05-23 NOTE — Clinical Social Work Psychosocial (Signed)
Clinical Social Work Department BRIEF PSYCHOSOCIAL ASSESSMENT 05/23/2013  Patient:  Jack [name], Jack [name]     Account Number:  [id number]     Admit date:  04/30/2013  Clinical Social Worker:  Varney Biles  Date/Time:  05/23/2013 10:08 AM  Referred by:  Physician  Date Referred:  05/23/2013 Referred for  SNF Placement   Other Referral:   Interview type:  Patient Other interview type:   CSW also spoke with pt's wife, who was at bedside.    PSYCHOSOCIAL DATA Living Status:  FAMILY Admitted from facility:   Level of care:   Primary support name:  Generoso Cropper Primary support relationship to patient:  SPOUSE Degree of support available:   Good--pt's spouse was at bedside. Pt was living at home with wife before hospital admission.    CURRENT CONCERNS Current Concerns  Post-Acute Placement   Other Concerns:    SOCIAL WORK ASSESSMENT / PLAN Pt accepting of need for SNF, and had preferences for facilities. First choice is [facility], and second choice is [facility]. CSW received permission to fax clinicals to these two facilities and all SNFs in [place] and [place] counties. Pt had originally been recommended for CIR, but is ineligible for CIR due to insurance. CSW will provide bed offers when facilities respond to bed request.   Assessment/plan status:  Psychosocial Support/Ongoing Assessment of Needs Other assessment/ plan:   Information/referral to community resources:   SNF.    PATIENT'S/FAMILY'S RESPONSE TO PLAN OF CARE: Pt and wife receptive to CSW visit, and understanding of CSW role.       Maryclare Labrador, MSW, [facility] Clinical Social Worker [phone]

## 2013-05-23 NOTE — Progress Notes (Signed)
ANTICOAGULATION CONSULT NOTE - Follow Up Consult  Pharmacy Consult for heparin Indication: DVT  Labs:  Recent Labs  05/21/13 0615  05/22/13 0555 05/22/13 1651 05/23/13 0555  HGB 8.4*  --  8.5*  --  8.3*  HCT 26.1*  --  25.7*  --  25.5*  PLT 244  --  247  --  274  HEPARINUNFRC <0.10*  < > 1.10* 0.77* 0.80*  CREATININE 1.13  --   --   --   --   < > = values in this interval not displayed.   Assessment: 67yo male remains supratherapeutic on heparin after rate decreases w/ increased level despite lower rate; concern for clot moving though IVC filter.  Goal of Therapy:  Heparin level 0.3-0.7 units/ml   Plan:  Will conservatively decrease heparin gtt to 1800 units/hr and check level in 6hr.  Vernard Gambles, PharmD, BCPS  05/23/2013,6:58 AM

## 2013-05-23 NOTE — Progress Notes (Signed)
Physical Therapy Treatment Patient Details Name: Jack [name] MRN: [id number] DOB: 09-17-1945 Today's Date: 05/23/2013 Time: 3086-5784 PT Time Calculation (min): 28 min  PT Assessment / Plan / Recommendation  History of Present Illness 41 M underwent TURP and L ureteral stent 8/29. Admitted to [facility] 9/02 with acute PE and anticoagulation initiated. Hospitalization c/b hematuria, progressive renal linsufficiency and anemia requiring transfusion. Bladder irrigations initiated and CT scan abdomen performed 9/03 revealed L renal fluid subcapsular fluid collection thought to represent hematoma. Transferred to [facility] 9/05 for impending need for HD and placement of IVC filter. Currently has a suprapubic catheter with CBI.  Pt with bil LE fasciotomies 10/1 and 10/4 with RLE closed 10/6 and LLE closed 10/8. Pt in bed 10/1-10/9   PT Comments   Pt progressing with mobility today able to tolerate increased time in standing and push up from lower surface with greater ease today. Pt still unable to ambulate but educated for HEP and encouraged to perform daily. Will continue to follow  Follow Up Recommendations  CIR     Does the patient have the potential to tolerate intense rehabilitation     Barriers to Discharge        Equipment Recommendations       Recommendations for Other Services    Frequency     Progress towards PT Goals Progress towards PT goals: Progressing toward goals  Plan Current plan remains appropriate    Precautions / Restrictions Precautions Precautions: Fall Restrictions Weight Bearing Restrictions: No   Pertinent Vitals/Pain 8/10 bil LE pain, premedicated HR 108    Mobility  Bed Mobility Bed Mobility: Supine to Sit Supine to Sit: 4: Min guard;With rails;HOB elevated (HOB 15degrees) Sitting - Scoot to Edge of Bed: 5: Supervision Details for Bed Mobility Assistance: cueing for use of rail and reciprocal scooting Transfers Transfers: Sit to Stand;Stand to Sit;Stand  Pivot Transfers Sit to Stand: From bed;From chair/3-in-1;3: Mod assist Stand to Sit: To chair/3-in-1;4: Min assist Stand Pivot Transfers: 1: +2 Total assist Stand Pivot Transfers: Patient Percentage: 60% Details for Transfer Assistance: Pt with improved ability with standing and weight bearing on bil LE. pt able to stand fully and take pivotal steps with Rw to chair. Pt stood again from chair and able to maintain standing 30sec for pericare  Ambulation/Gait Ambulation/Gait Assistance: Not tested (comment)    Exercises General Exercises - Lower Extremity Long Arc Quad: AROM;Seated;Both;20 reps Hip Flexion/Marching: AROM;Both;20 reps;Seated Toe Raises: AROM;Both;20 reps;Seated Heel Raises: AROM;10 reps;20 reps;Right;Left (10x on LLE)   PT Diagnosis:    PT Problem List:   PT Treatment Interventions:     PT Goals (current goals can now be found in the care plan section)    Visit Information  Last PT Received On: 05/23/13 Assistance Needed: +2 History of Present Illness: 44 M underwent TURP and L ureteral stent 8/29. Admitted to [facility] 9/02 with acute PE and anticoagulation initiated. Hospitalization c/b hematuria, progressive renal linsufficiency and anemia requiring transfusion. Bladder irrigations initiated and CT scan abdomen performed 9/03 revealed L renal fluid subcapsular fluid collection thought to represent hematoma. Transferred to [facility] 9/05 for impending need for HD and placement of IVC filter. Currently has a suprapubic catheter with CBI.  Pt with bil LE fasciotomies 10/1 and 10/4 with RLE closed 10/6 and LLE closed 10/8. Pt in bed 10/1-10/9    Subjective Data      Cognition  Cognition Arousal/Alertness: Awake/alert Behavior During Therapy: WFL for tasks assessed/performed Overall Cognitive Status: Within Functional  Limits for tasks assessed    Balance  Static Sitting Balance Static Sitting - Balance Support: No upper extremity supported;Feet supported Static  Sitting - Level of Assistance: 6: Modified independent (Device/Increase time) Static Sitting - Comment/# of Minutes: 4  End of Session PT - End of Session Equipment Utilized During Treatment: Gait belt Activity Tolerance: Patient limited by pain Patient left: in chair;with call bell/phone within reach;with nursing/sitter in room Nurse Communication: Mobility status   GP     Delorse Lek 05/23/2013, 10:36 AM Delaney Meigs, PT [phone]

## 2013-05-23 NOTE — Progress Notes (Signed)
Suprapubic catheter capped per MD order. Pt given new urinal and instructed to void in urinal, if able. Pt also instructed post void residual needs to be completed daily, per MD order.

## 2013-05-23 NOTE — Progress Notes (Addendum)
[facility]  Daily Progress Note  Assessment/Planning: S/p B fasciotomy and closure of such   Staples and sutures out in 2 weeks: will likely removed in staged fashion  Contact us if pt still in hospital at that time, otherwise, pt can follow in the office [phone])  Wound continue dry dressings and compression as drainage expected.  Some wound breakdown for fasciotomy closures not unexpected.  Subjective  - 2 Days Post-Op  Right leg sx resolved, some mild pain in left leg  Objective Filed Vitals:   05/22/13 2000 05/23/13 0000 05/23/13 0100 05/23/13 0344  BP: 99/44  117/60 103/40  Pulse: 93     Temp: 99.8 F (37.7 C)  98.4 F (36.9 C) 98.2 F (36.8 C)  TempSrc: Oral  Oral Oral  Resp: 24 20 16 19   Height:      Weight:      SpO2: 98%  98% 96%    Intake/Output Summary (Last 24 hours) at 05/23/13 0700 Last data filed at 05/23/13 0600  Gross per 24 hour  Intake 1162.12 ml  Output   2365 ml  Net -1202.88 ml   VASC  RLE: serosang drainage medial on bandage, inc x 2 intact, some erythema at staple site; LLE: min drainage on bandage, inc x 2 intact  Laboratory CBC    Component Value Date/Time   WBC 6.0 05/23/2013 0555   HGB 8.3* 05/23/2013 0555   HCT 25.5* 05/23/2013 0555   PLT 274 05/23/2013 0555    BMET    Component Value Date/Time   NA 131* 05/21/2013 0615   K 4.4 05/21/2013 0615   CL 102 05/21/2013 0615   CO2 23 05/21/2013 0615   GLUCOSE 103* 05/21/2013 0615   BUN 17 05/21/2013 0615   CREATININE 1.13 05/21/2013 0615   CALCIUM 7.4* 05/21/2013 0615   GFRNONAA 66* 05/21/2013 0615   GFRAA 76* 05/21/2013 0615    Leonides Sake, MD [facility] [place] Office: [phone] Pager: [phone]  05/23/2013, 7:00 AM

## 2013-05-23 NOTE — Progress Notes (Signed)
[facility] Progress Note [place] TEAM 1 - Stepdown/ICU TEAM  ROBBEN JAGIELLO JYN:[id number] DOB: 28-Jan-1946 DOA: 04/30/2013 PCP: Simone Curia, MD  HPI / Hospital Course: 67 y.o. male with complicated medical history including a recent stay at [facility] for PE S/P TURP who required subsequent bilateral renal stents for obstruction due to hemorrhagic complications of anticoagulation s/p eventual IVC filter. Patient was seen in the ED at [facility] 9/17 where his work up demonstrated creatinine of 6.0, WBC of 37k, potassium of 5.8, sodium 127, tachycardia, and mild hypotension. VQ scan was repeated which was negative for PE. He was given kayexelate in the ED, 2L NS, and transferred to [facility].   8/29 - TURP w/ L ureteral stent [facility]  9/02 - admit [facility] w/ V/Q "high probability" for PE >> anticoag >> severe hematuria + anemia + renal failure  9/3 - CT abdom reveals large L renal capsular hematoma  9/5 - transferred to [facility] to PCCM service - noted to have large clot in bladder w/ B hydronephrosis  9/5 - IVC filter placement  9/6 - B ureteral stents placed - open evacuation of clot from bladder- suprapubic tube placed  9/7- venous duplex lower extremity - NO DVT  9/21-venous duplex lower extremity- Bilateral: Positive for DVT in the common femoral, profunda, femoral, popliteal, and posterior tibial veins with superficial thrombus in the greater and lesser saphenous veins.  9/22 - IV heparin initiated for DVTs (anticoagulation had been on hold due to renal hematoma)  9/28: Decision was made to perform a catheter directed lysis of clot and patient was transferred to the ICU for monitoring and further care while on lytics. 9/28-Bilateral popliteal venous acc with initiation of bilateral LE and IVC thrombolysis, popliteal sheaths  9/29- IR inferior venacavagram, thrombolysis/thrombectomy bilateral lower extremities showing improved patency, IVC patent. Advised d/c lytic therapy,  continue heparin per pharmacy  9/30 - CT abd/pelvis showed stable L renal hematoma  10/1- Acute hematoma rt leg below knee, compartment syndrome  10/1- calf fasciotomy performed 10/4 LEFT calf fasciotomy performed 10/6 taken back to OR to close RIGHT fasciotomy 10/8 to OR to close LEFT fasciotomy  Assessment/Plan:  B LE compartment syndrome due to bleeding/hematoma S/p B fasciotomies - Vasc Surgery following - appears to be improving nicely   C diff colitis started flagyl 10/3 for 2 wk course - diarrhea appears to be slowing already   Acute renal failure/ CKD 3  crt was 1.85 at time of d/c 9/10 - Nephrology has signed off - ultrasound and Ct abd/pelvis revealed no acute findings - most likely simple pre-renal state v/s ATN related to hypotension - crt has normalized   Bilateral lower extremity DVT / PE  status post IVC filter 9/5 - given recent severe complications related to bleeding anticoagulation was not felt to be wise at the time of admission - further review of records revealed Dopplers from 9/7 which were negative for DVT - repeat dopplers this hospitalization confirmed extensive bilateral lower extremity DVTs - d/c'ed testosterone replacement  - due to fear of propagation of clot through IVC filter heparin was subsequently intitiated for anticoag w/ closer monitoring  - underwent thrombolysis by IR - was in ICU - then developed compartment syndrome and was taken to OR on 10/1- wound vac placed and then to be taken back to OR on 10/6 on right leg to close - left leg had fasciotomy on 10/4 with closure 10/8 - on heparin   Progressive anemia  Likely a  combination of significant recent acute loss plus decreased production - transfused multiple units of PRBC during this stay - hgb stable on Heparin currently- will switch to Xarelto now  Hemorrhoidal GI bleed  -bright red blood streaking in stools 9/25 - appears to be from hemorrhoids - no further GI bleeding since 10/3- mucous  stools   S/p transurethral resection of prostate with left ureteroscopic stone manipulation 04/13/13 complicated by perinephric hematoma with bilateral hydronephrosis  underwent urgent cystoscopy 04/18/2013 with bilateral stent placement and bladder evacuation with suprapubic tube placement  - s/w Urology today- cap suprapubic cath and follow post void residuals- if he is doing well, can ask them next week to remove the cath.   Hypotension -  BP has now stabilized  Hyperkalemia resolved  Code Status: FULL Family Communication: patient and wife at bedside  Disposition Plan: SDU   Consultants:  Nephrology  Vascular Surgery   IR  Urology  ANTIBIOTICS:  Flagyl 10/3 >>  HPI/Subjective: Pt asking about removal of suprapubic cath today- otherwise continues to do well   Objective: Filed Vitals:   05/23/13 1200  BP: 131/71  Pulse:   Temp: 98.2 F (36.8 C)  Resp: 30    Intake/Output Summary (Last 24 hours) at 05/23/13 1302 Last data filed at 05/23/13 1100  Gross per 24 hour  Intake 1064.62 ml  Output   2115 ml  Net -1050.38 ml   Filed Weights   05/20/13 0434 05/21/13 0421 05/22/13 0400  Weight: 121.3 kg (267 lb 6.7 oz) 118.6 kg (261 lb 7.5 oz) 122.2 kg (269 lb 6.4 oz)    Exam: General: No acute respiratory distress Lungs: Clear to auscultation bilaterally without wheezes or crackles Cardiovascular: Regular rate and rhythm without murmur gallop or rub normal S1 and S2 Abdomen: Nontender, nondistended, soft, bowel sounds positive, no rebound, no ascites, no appreciable mass Extremities: vertical incisions noted on both legs staples- oozing clear fluid   Data Reviewed: Basic Metabolic Panel:  Recent Labs Lab 05/18/13 0544 05/18/13 1838 05/19/13 0230 05/20/13 0220 05/21/13 0615  NA 130* 127* 129* 130* 131*  K 5.9* 4.5 4.5 4.5 4.4  CL 97 93* 96 101 102  CO2 23 23 22 22 23   GLUCOSE 126* 138* 121* 112* 103*  BUN 45* 42* 37* 26* 17  CREATININE 2.12* 1.69*  1.48* 1.31 1.13  CALCIUM 7.5* 7.0* 6.9* 6.8* 7.4*   Liver Function Tests: No results found for this basename: AST, ALT, ALKPHOS, BILITOT, PROT, ALBUMIN,  in the last 168 hours  CBC:  Recent Labs Lab 05/19/13 1400 05/20/13 0220 05/21/13 0615 05/22/13 0555 05/23/13 0555  WBC 8.9 9.1 7.6 6.1 6.0  HGB 7.7* 7.4* 8.4* 8.5* 8.3*  HCT 22.9* 22.3* 26.1* 25.7* 25.5*  MCV 86.7 87.5 88.5 88.0 88.2  PLT 224 227 244 247 274   Cardiac Enzymes:  Recent Labs Lab 05/17/13 1230  CKTOTAL 373*   BNP (last 3 results)  Recent Labs  04/18/13 1245 04/27/13 1240  PROBNP 1762.0* 1260.0*   CBG:  Recent Labs Lab 05/18/13 1658 05/19/13 0910 05/19/13 1237 05/19/13 1656  GLUCAP 139* 126* 111* 118*    Recent Results (from the past 240 hour(s))  CLOSTRIDIUM DIFFICILE BY PCR     Status: Abnormal   Collection Time    05/16/13  2:34 AM      Result Value Range Status   C difficile by pcr POSITIVE (*) NEGATIVE Final   Comment: CRITICAL RESULT CALLED TO, READ BACK BY AND VERIFIED WITH:  Pennelope Bracken 05/16/13 1120 BY K SCHULTZ  MRSA PCR SCREENING     Status: None   Collection Time    05/16/13  3:26 PM      Result Value Range Status   MRSA by PCR NEGATIVE  NEGATIVE Final   Comment:            The GeneXpert MRSA Assay (FDA     approved for NASAL specimens     only), is one component of a     comprehensive MRSA colonization     surveillance program. It is not     intended to diagnose MRSA     infection nor to guide or     monitor treatment for     MRSA infections.     Studies: No results found.  Scheduled Meds: . hydrocortisone   Rectal BID  . metroNIDAZOLE  500 mg Oral Q8H  . Rivaroxaban  15 mg Oral BID WC  . [START ON 06/13/2013] rivaroxaban  20 mg Oral Q supper  . sodium chloride  3 mL Intravenous Q12H   Time spent: 35 mins  [facility], MD  [facility] Office  [phone] Pager [phone]  On-Call/Text Page:      [email or web address]      password [facility]  05/23/2013,  1:02 PM  LOS: 23 days

## 2013-05-24 ENCOUNTER — Encounter (HOSPITAL_COMMUNITY): Payer: Self-pay | Admitting: Radiology

## 2013-05-24 ENCOUNTER — Inpatient Hospital Stay (HOSPITAL_COMMUNITY): Payer: PRIVATE HEALTH INSURANCE

## 2013-05-24 LAB — CBC
HCT: 22.9 % — ABNORMAL LOW (ref 39.0–52.0)
Hemoglobin: 7.4 g/dL — ABNORMAL LOW (ref 13.0–17.0)
MCH: 28.2 pg (ref 26.0–34.0)
MCHC: 32.3 g/dL (ref 30.0–36.0)
MCV: 87.4 fL (ref 78.0–100.0)
Platelets: 272 10*3/uL (ref 150–400)
RBC: 2.62 MIL/uL — ABNORMAL LOW (ref 4.22–5.81)
RDW: 16.2 % — ABNORMAL HIGH (ref 11.5–15.5)
WBC: 5.2 10*3/uL (ref 4.0–10.5)

## 2013-05-24 LAB — BASIC METABOLIC PANEL
BUN: 15 mg/dL (ref 6–23)
CO2: 26 mEq/L (ref 19–32)
Calcium: 7.5 mg/dL — ABNORMAL LOW (ref 8.4–10.5)
Creatinine, Ser: 1.1 mg/dL (ref 0.50–1.35)
GFR calc Af Amer: 79 mL/min — ABNORMAL LOW (ref >90)
GFR calc non Af Amer: 68 mL/min — ABNORMAL LOW (ref >90)

## 2013-05-24 LAB — HEMOGLOBIN AND HEMATOCRIT, BLOOD
HCT: 24.7 % — ABNORMAL LOW (ref 39.0–52.0)
Hemoglobin: 8 g/dL — ABNORMAL LOW (ref 13.0–17.0)

## 2013-05-24 MED ORDER — DIPHENHYDRAMINE HCL 50 MG/ML IJ SOLN
50.0000 mg | Freq: Once | INTRAMUSCULAR | Status: AC
  Administered 2013-05-24: 50 mg via INTRAVENOUS
  Filled 2013-05-24: qty 1

## 2013-05-24 MED ORDER — SODIUM CHLORIDE 0.9 % IV SOLN
25.0000 mg | Freq: Once | INTRAVENOUS | Status: AC
  Administered 2013-05-24: 11:00:00 25 mg via INTRAVENOUS
  Filled 2013-05-24 (×2): qty 0.5

## 2013-05-24 MED ORDER — OXYCODONE HCL 5 MG PO TABS
5.0000 mg | ORAL_TABLET | Freq: Four times a day (QID) | ORAL | Status: DC
  Administered 2013-05-24 – 2013-05-29 (×22): 5 mg via ORAL
  Filled 2013-05-24 (×22): qty 1

## 2013-05-24 MED ORDER — CYANOCOBALAMIN 1000 MCG/ML IJ SOLN
1000.0000 ug | Freq: Once | INTRAMUSCULAR | Status: AC
  Administered 2013-05-24: 1000 ug via INTRAMUSCULAR
  Filled 2013-05-24: qty 1

## 2013-05-24 MED ORDER — METHYLPREDNISOLONE SODIUM SUCC 40 MG IJ SOLR
40.0000 mg | INTRAMUSCULAR | Status: AC
  Administered 2013-05-24: 10:00:00 40 mg via INTRAVENOUS
  Filled 2013-05-24: qty 1

## 2013-05-24 MED ORDER — IOHEXOL 300 MG/ML  SOLN
100.0000 mL | Freq: Once | INTRAMUSCULAR | Status: AC | PRN
  Administered 2013-05-24: 100 mL via INTRAVENOUS

## 2013-05-24 MED ORDER — SODIUM CHLORIDE 0.9 % IV SOLN
250.0000 mg | Freq: Once | INTRAVENOUS | Status: AC
  Administered 2013-05-24: 250 mg via INTRAVENOUS
  Filled 2013-05-24 (×2): qty 5

## 2013-05-24 MED ORDER — MORPHINE SULFATE 2 MG/ML IJ SOLN
1.0000 mg | INTRAMUSCULAR | Status: DC | PRN
  Administered 2013-05-24: 2 mg via INTRAVENOUS
  Filled 2013-05-24: qty 1

## 2013-05-24 NOTE — Progress Notes (Addendum)
[facility] Progress Note [place] TEAM 1 - Stepdown/ICU TEAM  WILLETT LEFEBER ZOX:[id number] DOB: Feb 09, 1946 DOA: 04/30/2013 PCP: Simone Curia, MD  HPI / Hospital Course: 67 y.o. male with complicated medical history including a recent stay at [facility] for PE S/P TURP who required subsequent bilateral renal stents for obstruction due to hemorrhagic complications of anticoagulation s/p eventual IVC filter. Patient was seen in the ED at [facility] 9/17 where his work up demonstrated creatinine of 6.0, WBC of 37k, potassium of 5.8, sodium 127, tachycardia, and mild hypotension. VQ scan was repeated which was negative for PE. He was given kayexelate in the ED, 2L NS, and transferred to [facility].   8/29 - TURP w/ L ureteral stent [facility]  9/02 - admit [facility] w/ V/Q "high probability" for PE >> anticoag >> severe hematuria + anemia + renal failure  9/3 - CT abdom reveals large L renal capsular hematoma  9/5 - transferred to [facility] to PCCM service - noted to have large clot in bladder w/ B hydronephrosis  9/5 - IVC filter placement  9/6 - B ureteral stents placed - open evacuation of clot from bladder- suprapubic tube placed  9/7- venous duplex lower extremity - NO DVT  9/21-venous duplex lower extremity- Bilateral: Positive for DVT in the common femoral, profunda, femoral, popliteal, and posterior tibial veins with superficial thrombus in the greater and lesser saphenous veins.  9/22 - IV heparin initiated for DVTs (anticoagulation had been on hold due to renal hematoma)  9/28: Decision was made to perform a catheter directed lysis of clot and patient was transferred to the ICU for monitoring and further care while on lytics. 9/28-Bilateral popliteal venous acc with initiation of bilateral LE and IVC thrombolysis, popliteal sheaths  9/29- IR inferior venacavagram, thrombolysis/thrombectomy bilateral lower extremities showing improved patency, IVC patent. Advised d/c lytic therapy,  continue heparin per pharmacy  9/30 - CT abd/pelvis showed stable L renal hematoma  10/1- Acute hematoma rt leg below knee, compartment syndrome  10/1- calf fasciotomy performed 10/4 LEFT calf fasciotomy performed 10/6 taken back to OR to close RIGHT fasciotomy 10/8 to OR to close LEFT fasciotomy  Assessment/Plan: S/p transurethral resection of prostate with left ureteroscopic stone manipulation 04/13/13 complicated by perinephric hematoma with bilateral hydronephrosis  underwent urgent cystoscopy 04/18/2013 with bilateral stent placement and bladder evacuation with suprapubic tube placement  - d/w Urology 10/10 - cap suprapubic cath and follow post void residuals- if he is doing well, can ask them next week to remove the cath.  - developed hematuria again on 10/10 continuing today- anticoagulation had to be discontinued once again- high risk of re-developing DVTs- initially was able to urinate after suprapubic cath capped (bloody urine) but now unable to urinate- 400cc obtained via suprapubic cath when it was uncapped this morning- I have asked RN to leave it uncapped for now- - CT of renal today to look for clot- will need to be premedicated - Dr Patsi Sears will see today  Bilateral lower extremity DVT / PE  status post IVC filter 9/5 - given recent severe complications related to bleeding anticoagulation was not felt to be wise at the time of admission - further review of records revealed Dopplers from 9/7 which were negative for DVT - repeat dopplers this hospitalization confirmed extensive bilateral lower extremity DVTs - d/c'ed testosterone replacement  - due to fear of propagation of clot through IVC filter, heparin was subsequently re-intitiated on 9/22 for anticoag w/ closer monitoring - - clot extended  from lower extremities through IVC and involved filter- he underwent thrombolysis by IR- then developed compartment syndrome and was taken to OR on 10/1right calf fasciotomy -back to OR on  10/6 to close faciotomy right leg to close - left leg had fasciotomy on 10/4 with closure 10/8 - on heparin  B LE compartment syndrome due to bleeding/hematoma S/p B/L fasciotomies - Vasc Surgery has been following -   - Oxycodone Q6 routine with PRN IV Morphine to control pain   C diff colitis started flagyl 10/3 for 2 wk course - diarrhea resolving  Acute renal failure/ CKD 3  crt was 1.85 at time of d/c 9/10 - Nephrology has signed off - ultrasound and Ct abd/pelvis revealed no acute findings - most likely simple pre-renal state v/s ATN related to hypotension - crt has normalized    Progressive anemia / Iron deficiency Likely a combination of significant recent acute loss plus decreased production - transfused multiple units of PRBC during this stay -administer IV Iron today - recheck Hgb 4 PM  Vit B 12- low normal - administer 1000 mcg today  Hemorrhoidal GI bleed  -bright red blood streaking in stools 9/25 - appears to be from hemorrhoids - no further GI bleeding since 10/3- mucous stools   Hypotension -  BP has now stabilized  Hyperkalemia resolved  Code Status: FULL Family Communication: patient and wife at bedside  Disposition Plan: on 67 [address]- SNF when ready- has NO insurance coverage for LTAC (where I would prefer him to go) or for CIR DVT Prophylaxis: ankle compression boots for now  Consultants:  Nephrology  Vascular Surgery   IR  Urology  ANTIBIOTICS:  Flagyl 10/3 >>  HPI/Subjective: Pt extremely depressed today about the recurrence of hematuria- states leg pain was uncontrolled yesterday and he required IV morphine- states he felt significant relief after suprapubic cath was uncapped today.   Objective: Filed Vitals:   05/24/13 0454  BP: 110/53  Pulse: 80  Temp: 98.7 F (37.1 C)  Resp: 18    Intake/Output Summary (Last 24 hours) at 05/24/13 0831 Last data filed at 05/24/13 0650  Gross per 24 hour  Intake 551.33 ml  Output   1777 ml  Net  -1225.67 ml   Filed Weights   05/21/13 0421 05/22/13 0400 05/23/13 2146  Weight: 118.6 kg (261 lb 7.5 oz) 122.2 kg (269 lb 6.4 oz) 123.061 kg (271 lb 4.8 oz)    Exam: General: No acute respiratory distress Lungs: Clear to auscultation bilaterally without wheezes or crackles Cardiovascular: Regular rate and rhythm without murmur gallop or rub normal S1 and S2 Abdomen: Nontender, nondistended, soft, bowel sounds positive, no rebound, no ascites, no appreciable mass Extremities: legs wrapped today- has underlying extensive vertical incisions with staples on both legs staples- have been oozing clear fluid  GU: blood tinged urine in collection bag  Data Reviewed: Basic Metabolic Panel:  Recent Labs Lab 05/18/13 1838 05/19/13 0230 05/20/13 0220 05/21/13 0615 05/24/13 0540  NA 127* 129* 130* 131* 136  K 4.5 4.5 4.5 4.4 4.4  CL 93* 96 101 102 105  CO2 23 22 22 23 26   GLUCOSE 138* 121* 112* 103* 113*  BUN 42* 37* 26* 17 15  CREATININE 1.69* 1.48* 1.31 1.13 1.10  CALCIUM 7.0* 6.9* 6.8* 7.4* 7.5*   Liver Function Tests: No results found for this basename: AST, ALT, ALKPHOS, BILITOT, PROT, ALBUMIN,  in the last 168 hours  CBC:  Recent Labs Lab 05/20/13 0220 05/21/13 0615 05/22/13  1610 05/23/13 0555 05/24/13 0540  WBC 9.1 7.6 6.1 6.0 5.2  HGB 7.4* 8.4* 8.5* 8.3* 7.4*  HCT 22.3* 26.1* 25.7* 25.5* 22.9*  MCV 87.5 88.5 88.0 88.2 87.4  PLT 227 244 247 274 272   Cardiac Enzymes:  Recent Labs Lab 05/17/13 1230  CKTOTAL 373*   BNP (last 3 results)  Recent Labs  04/18/13 1245 04/27/13 1240  PROBNP 1762.0* 1260.0*   CBG:  Recent Labs Lab 05/18/13 1658 05/19/13 0910 05/19/13 1237 05/19/13 1656  GLUCAP 139* 126* 111* 118*    Recent Results (from the past 240 hour(s))  CLOSTRIDIUM DIFFICILE BY PCR     Status: Abnormal   Collection Time    05/16/13  2:34 AM      Result Value Range Status   C difficile by pcr POSITIVE (*) NEGATIVE Final   Comment: CRITICAL  RESULT CALLED TO, READ BACK BY AND VERIFIED WITH:     Pennelope Bracken 05/16/13 1120 BY K SCHULTZ  MRSA PCR SCREENING     Status: None   Collection Time    05/16/13  3:26 PM      Result Value Range Status   MRSA by PCR NEGATIVE  NEGATIVE Final   Comment:            The GeneXpert MRSA Assay (FDA     approved for NASAL specimens     only), is one component of a     comprehensive MRSA colonization     surveillance program. It is not     intended to diagnose MRSA     infection nor to guide or     monitor treatment for     MRSA infections.     Studies: No results found.  Scheduled Meds: . hydrocortisone   Rectal BID  . metroNIDAZOLE  500 mg Oral Q8H  . oxyCODONE  5 mg Oral Q6H  . sodium chloride  3 mL Intravenous Q12H   Time spent: 35 mins  [facility], MD  [facility] Office  [phone] Pager [phone]  On-Call/Text Page:      [email or web address]      password [facility]  05/24/2013, 8:31 AM  LOS: 24 days

## 2013-05-24 NOTE — Consult Note (Signed)
Urology Consult  Referring physician:   Dr. Hershal Coria, [facility] Reason for referral:   Hematuria, recurrent Cc: Dr. Bjorn Pippin Chief Complaint:   Gross hematuria and suprapubic tube  Still in place ( placed September).  History of Present Illness:    1 - 67 yo male transferred to [facility] with  wound problems after TURP and Subsequent Open Bladder Evacuation - Pt s/p transurethral resection of prostate with left ureteroscopic stone manipulation 04/13/13 at [facility] by [facility] MD. Subsequently developed acute renal failure, bilateral hydronephrosis, left perniephric hematoma and clot retention requiring transfer to [facility] 9/5 with urgent cystoscopy, bilateral ureteral stent placement, and open bladder evacuation with SP Tube placement by Annabell Howells MD. Has known rt renal atrophy. Was discharged and doing well and had sutures removed 9/16 by [facility] with subsequent opening of wound and replacement of sutures in office 9/17. Presented to [facility] ER later 9/17 with leg swelling, recurrent acute renal failure and transferred once again to [facility] hospital.    He has developed      Thrombophlebitis, necessitating fibrinolysis and IVC filter, and bilateral lower extremity fasciotomies.    He now has developed gross hematuria, and Urology has been ure-consulted.      Past Medical History  Diagnosis Date  . HTN (hypertension)   . Ureteral stone   . Colon cancer   . Shortness of breath   . Asthma   . Chronic kidney disease     acuts on chronic renal failure  . Arthritis   . PE (pulmonary embolism) 04/2013   Past Surgical History  Procedure Laterality Date  . Ureteral stent placement  04/10/2013  . Transurethral resection of prostate  04/10/2013  . Right colectomy  1996  . Tonsillectomy    . Cystoscopy with stent placement Bilateral 04/19/2013    Procedure: CYSTOSCOPY WITH STENT PLACEMENT;  Surgeon: Anner Crete, MD;  Location: [facility] OR;  Service: Urology;  Laterality: Bilateral;  . Cystoscopy  N/A 04/19/2013    Procedure: CYSTOSCOPY WITH CLOT EVACUATION ;  Surgeon: Anner Crete, MD;  Location: [facility] OR;  Service: Urology;  Laterality: N/A;  . Laparotomy N/A 04/19/2013    Procedure: EXPLORATORY LAPAROTOMY;  Surgeon: Anner Crete, MD;  Location: [facility] OR;  Service: Urology;  Laterality: N/A;  Exploratory Laparotomy with evacuation of blood clot in bladder with placement of super pubic tube   . Fasciotomy Right 05/14/2013    Procedure: FASCIOTOMY with wound vac placement;  Surgeon: Fransisco Hertz, MD;  Location: [facility] OR;  Service: Vascular;  Laterality: Right;  . Application of wound vac Right 05/14/2013    Procedure: APPLICATION OF WOUND VAC;  Surgeon: Fransisco Hertz, MD;  Location: [facility] OR;  Service: Vascular;  Laterality: Right;  . Fasciotomy Left 05/17/2013    Procedure: FASCIOTOMY LEG;  Surgeon: Sherren Kerns, MD;  Location: [facility] OR;  Service: Vascular;  Laterality: Left;  . Fasciotomy closure Right 05/19/2013    Procedure: RIGHT LOWER EXTREMITY CLOSURE OF LATERAL AND MEDIAL FASCIOTOMIES ;  Surgeon: Fransisco Hertz, MD;  Location: [facility] OR;  Service: Vascular;  Laterality: Right;  . Fasciotomy closure Left 05/21/2013    Procedure: FASCIOTOMY CLOSURE- LEFT LEG;  Surgeon: Fransisco Hertz, MD;  Location: [facility] OR;  Service: Vascular;  Laterality: Left;    Medications: I have reviewed the patient's current medications. Allergies:  Allergies  Allergen Reactions  . Contrast Media [Iodinated Diagnostic Agents]     On 04/29/2013 spoke with patient he had some type  of procedure at [facility] and broke out in hives after the exams in 1997, he did not know what of type of study he had.  I called [facility] Radiology department the only type of contrast they used during that time  was Conray    Family History  Problem Relation Age of Onset  . Urolithiasis    . Deep vein thrombosis Mother   . Heart disease Sister    Social History:  reports that he has never smoked. His smokeless tobacco use includes Chew. He  reports that he drinks alcohol. He reports that he does not use illicit drugs.  ROS: All systems are reviewed and negative except as noted.   Physical Exam:  Vital signs in last 24 hours: Temp:  [98.3 F (36.8 C)-98.7 F (37.1 C)] 98.7 F (37.1 C) (10/11 0454) Pulse Rate:  [80-136] 80 (10/11 0454) Resp:  [18-20] 18 (10/11 0454) BP: (110-140)/(53-73) 110/53 mmHg (10/11 0454) SpO2:  [93 %-99 %] 99 % (10/11 0454) Weight:  [123.061 kg (271 lb 4.8 oz)] 123.061 kg (271 lb 4.8 oz) (10/10 2146)  Cardiovascular: Skin warm; not flushed Respiratory: Breaths quiet; no shortness of breath Abdomen: No masses. S-p tube in place. No erytrhema around insertion point.  Neurological: Normal sensation to touch Musculoskeletal: Normal motor function arms and legs. Lower extremities wrapped.  Lymphatics: No inguinal adenopathy Skin: No rashes Genitourinary: foley in place,   Laboratory Data:  Results for orders placed during the hospital encounter of 04/30/13 (from the past 72 hour(s))  HEPARIN LEVEL (UNFRACTIONATED)     Status: Abnormal   Collection Time    05/21/13  8:15 PM      Result Value Range   Heparin Unfractionated 1.48 (*) 0.30 - 0.70 IU/mL   Comment:            IF HEPARIN RESULTS ARE BELOW     EXPECTED VALUES, AND PATIENT     DOSAGE HAS BEEN CONFIRMED,     SUGGEST FOLLOW UP TESTING     OF ANTITHROMBIN III LEVELS.     RESULTS CONFIRMED BY MANUAL DILUTION  CBC     Status: Abnormal   Collection Time    05/22/13  5:55 AM      Result Value Range   WBC 6.1  4.0 - 10.5 K/uL   RBC 2.92 (*) 4.22 - 5.81 MIL/uL   Hemoglobin 8.5 (*) 13.0 - 17.0 g/dL   HCT 16.1 (*) 09.6 - 04.5 %   MCV 88.0  78.0 - 100.0 fL   MCH 29.1  26.0 - 34.0 pg   MCHC 33.1  30.0 - 36.0 g/dL   RDW 40.9 (*) 81.1 - 91.4 %   Platelets 247  150 - 400 K/uL  HEPARIN LEVEL (UNFRACTIONATED)     Status: Abnormal   Collection Time    05/22/13  5:55 AM      Result Value Range   Heparin Unfractionated 1.10 (*) 0.30 - 0.70  IU/mL   Comment: RESULTS CONFIRMED BY MANUAL DILUTION                IF HEPARIN RESULTS ARE BELOW     EXPECTED VALUES, AND PATIENT     DOSAGE HAS BEEN CONFIRMED,     SUGGEST FOLLOW UP TESTING     OF ANTITHROMBIN III LEVELS.  HEPARIN LEVEL (UNFRACTIONATED)     Status: Abnormal   Collection Time    05/22/13  4:51 PM      Result Value Range  Heparin Unfractionated 0.77 (*) 0.30 - 0.70 IU/mL   Comment:            IF HEPARIN RESULTS ARE BELOW     EXPECTED VALUES, AND PATIENT     DOSAGE HAS BEEN CONFIRMED,     SUGGEST FOLLOW UP TESTING     OF ANTITHROMBIN III LEVELS.  CBC     Status: Abnormal   Collection Time    05/23/13  5:55 AM      Result Value Range   WBC 6.0  4.0 - 10.5 K/uL   RBC 2.89 (*) 4.22 - 5.81 MIL/uL   Hemoglobin 8.3 (*) 13.0 - 17.0 g/dL   HCT 16.1 (*) 09.6 - 04.5 %   MCV 88.2  78.0 - 100.0 fL   MCH 28.7  26.0 - 34.0 pg   MCHC 32.5  30.0 - 36.0 g/dL   RDW 40.9 (*) 81.1 - 91.4 %   Platelets 274  150 - 400 K/uL  HEPARIN LEVEL (UNFRACTIONATED)     Status: Abnormal   Collection Time    05/23/13  5:55 AM      Result Value Range   Heparin Unfractionated 0.80 (*) 0.30 - 0.70 IU/mL   Comment:            IF HEPARIN RESULTS ARE BELOW     EXPECTED VALUES, AND PATIENT     DOSAGE HAS BEEN CONFIRMED,     SUGGEST FOLLOW UP TESTING     OF ANTITHROMBIN III LEVELS.  VITAMIN B12     Status: None   Collection Time    05/23/13 12:28 PM      Result Value Range   Vitamin B-12 396  211 - 911 pg/mL   Comment: Performed at [facility]  FOLATE     Status: None   Collection Time    05/23/13 12:28 PM      Result Value Range   Folate 6.3     Comment: (NOTE)     Reference Ranges            Deficient:       0.4 - 3.3 ng/mL            Indeterminate:   3.4 - 5.4 ng/mL            Normal:              > 5.4 ng/mL     Performed at [facility]  IRON AND TIBC     Status: Abnormal   Collection Time    05/23/13 12:28 PM      Result Value Range   Iron 40 (*) 42 - 135  ug/dL   TIBC 782 (*) 956 - 213 ug/dL   Saturation Ratios 28  20 - 55 %   UIBC 105 (*) 125 - 400 ug/dL   Comment: Performed at [facility]  FERRITIN     Status: Abnormal   Collection Time    05/23/13 12:28 PM      Result Value Range   Ferritin 1155 (*) 22 - 322 ng/mL   Comment: Performed at [facility]  RETICULOCYTES     Status: Abnormal   Collection Time    05/23/13 12:28 PM      Result Value Range   Retic Ct Pct 8.0 (*) 0.4 - 3.1 %   RBC. 3.03 (*) 4.22 - 5.81 MIL/uL   Retic Count, Manual 242.4 (*) 19.0 - 186.0 K/uL  CBC  Status: Abnormal   Collection Time    05/24/13  5:40 AM      Result Value Range   WBC 5.2  4.0 - 10.5 K/uL   RBC 2.62 (*) 4.22 - 5.81 MIL/uL   Hemoglobin 7.4 (*) 13.0 - 17.0 g/dL   HCT 16.1 (*) 09.6 - 04.5 %   MCV 87.4  78.0 - 100.0 fL   MCH 28.2  26.0 - 34.0 pg   MCHC 32.3  30.0 - 36.0 g/dL   RDW 40.9 (*) 81.1 - 91.4 %   Platelets 272  150 - 400 K/uL  BASIC METABOLIC PANEL     Status: Abnormal   Collection Time    05/24/13  5:40 AM      Result Value Range   Sodium 136  135 - 145 mEq/L   Potassium 4.4  3.5 - 5.1 mEq/L   Chloride 105  96 - 112 mEq/L   CO2 26  19 - 32 mEq/L   Glucose, Bld 113 (*) 70 - 99 mg/dL   BUN 15  6 - 23 mg/dL   Creatinine, Ser 7.82  0.50 - 1.35 mg/dL   Calcium 7.5 (*) 8.4 - 10.5 mg/dL   GFR calc non Af Amer 68 (*) >90 mL/min   GFR calc Af Amer 79 (*) >90 mL/min   Comment: (NOTE)     The eGFR has been calculated using the CKD EPI equation.     This calculation has not been validated in all clinical situations.     eGFR's persistently <90 mL/min signify possible Chronic Kidney     Disease.   Recent Results (from the past 240 hour(s))  CLOSTRIDIUM DIFFICILE BY PCR     Status: Abnormal   Collection Time    05/16/13  2:34 AM      Result Value Range Status   C difficile by pcr POSITIVE (*) NEGATIVE Final   Comment: CRITICAL RESULT CALLED TO, READ BACK BY AND VERIFIED WITH:     Pennelope Bracken 05/16/13 1120 BY K  SCHULTZ  MRSA PCR SCREENING     Status: None   Collection Time    05/16/13  3:26 PM      Result Value Range Status   MRSA by PCR NEGATIVE  NEGATIVE Final   Comment:            The GeneXpert MRSA Assay (FDA     approved for NASAL specimens     only), is one component of a     comprehensive MRSA colonization     surveillance program. It is not     intended to diagnose MRSA     infection nor to guide or     monitor treatment for     MRSA infections.   Creatinine:  Recent Labs  05/18/13 0544 05/18/13 1838 05/19/13 0230 05/20/13 0220 05/21/13 0615 05/24/13 0540  CREATININE 2.12* 1.69* 1.48* 1.31 1.13 1.10    Xrays:Clinical Data: reassess renal hematomas  CT ABDOMEN AND PELVIS WITHOUT CONTRAST  Technique: Multidetector CT imaging of the abdomen and pelvis was  performed following the standard protocol without intravenous  contrast.  Comparison: 05/10/13  Findings: Slight increase in small bilateral pleural effusions.  Liver, gallbladder, spleen, pancreas, and adrenal glands normal and  unchanged, except for incidental punctate calcification right  adrenal gland.  Bilateral ureteral stents unchanged. 16 mm thick subcapsular  hematoma left kidney stable. Large left upper pole cyst stable.  Small right exophytic mid pole cyst stable.  Suprapubic bladder catheter  unchanged. IVC filter stable. Bowel  remains unremarkable.  IMPRESSION:  Stable subcapsular left renal hematoma.Small bilateral pleural  effusions, mildly increased.  Original Report Authenticated By: Esperanza Heir, M.D.   Impression/Assessment:  Assesment per Dr. Alphonsa Gin:  67y.o. male with complicated medical history including a recent stay at [facility] for PE S/P TURP who required subsequent bilateral renal stents for obstruction due to hemorrhagic complications of anticoagulation s/p eventual IVC filter. Patient was seen in the ED at [facility] 9/17 where his work up demonstrated creatinine of 6.0, WBC of 37k,  potassium of 5.8, sodium 127, tachycardia, and mild hypotension. VQ scan was repeated which was negative for PE. He was given kayexelate in the ED, 2L NS, and transferred to [facility].  8/29 - TURP w/ L ureteral stent [facility]  9/02 - admit [facility] w/ V/Q "high probability" for PE >> anticoag >> severe hematuria + anemia + renal failure  9/3 - CT abdom reveals large L renal capsular hematoma  9/5 - transferred to [facility] to PCCM service - noted to have large clot in bladder w/ B hydronephrosis  9/5 - IVC filter placement  9/6 - B ureteral stents placed - open evacuation of clot from bladder- suprapubic tube placed  9/7- venous duplex lower extremity - NO DVT  9/21-venous duplex lower extremity- Bilateral: Positive for DVT in the common femoral, profunda, femoral, popliteal, and posterior tibial veins with superficial thrombus in the greater and lesser saphenous veins.  9/22 - IV heparin initiated for DVTs (anticoagulation had been on hold due to renal hematoma)  9/28: Decision was made to perform a catheter directed lysis of clot and patient was transferred to the ICU for monitoring and further care while on lytics.  9/28-Bilateral popliteal venous acc with initiation of bilateral LE and IVC thrombolysis, popliteal sheaths  9/29- IR inferior venacavagram, thrombolysis/thrombectomy bilateral lower extremities showing improved patency, IVC patent. Advised d/c lytic therapy, continue heparin per pharmacy  9/30 - CT abd/pelvis showed stable L renal hematoma  10/1- Acute hematoma rt leg below knee, compartment syndrome  10/1- calf fasciotomy performed  10/4 LEFT calf fasciotomy performed  10/6 taken back to OR to close RIGHT fasciotomy  10/8 to OR to close LEFT fasciotomy  Assessment/Plan:  S/p transurethral resection of prostate with left ureteroscopic stone manipulation 04/13/13 complicated by perinephric hematoma with bilateral hydronephrosis  underwent urgent cystoscopy 04/18/2013 with  bilateral stent placement and bladder evacuation with suprapubic tube placement  - d/w Urology 10/10 - cap suprapubic cath and follow post void residuals- if he is doing well, can ask them next week to remove the cath.  - developed hematuria again on 10/10 continuing today- anticoagulation had to be discontinued once again- high risk of re-developing DVTs- initially was able to urinate after suprapubic cath capped (bloody urine) but now unable to urinate- 400cc obtained via suprapubic cath when it was uncapped this morning- I have asked RN to leave it uncapped for now-  - CT of renal today to look for clot- will need to be premedicated   Plan:  Anticipate that s-p tub ewill need to be changed next week.  Will await CT review. Not ready as of this note.   Nesiah Jump I 05/24/2013, 2:08 PM

## 2013-05-25 DIAGNOSIS — R52 Pain, unspecified: Secondary | ICD-10-CM

## 2013-05-25 LAB — BASIC METABOLIC PANEL
CO2: 24 mEq/L (ref 19–32)
Calcium: 8 mg/dL — ABNORMAL LOW (ref 8.4–10.5)
Creatinine, Ser: 1.12 mg/dL (ref 0.50–1.35)
GFR calc Af Amer: 77 mL/min — ABNORMAL LOW (ref >90)
GFR calc non Af Amer: 66 mL/min — ABNORMAL LOW (ref >90)

## 2013-05-25 LAB — CBC
Hemoglobin: 7.3 g/dL — ABNORMAL LOW (ref 13.0–17.0)
MCV: 87.7 fL (ref 78.0–100.0)
Platelets: 263 10*3/uL (ref 150–400)
RDW: 16 % — ABNORMAL HIGH (ref 11.5–15.5)
WBC: 6.5 10*3/uL (ref 4.0–10.5)

## 2013-05-25 MED ORDER — SODIUM CHLORIDE 0.9 % IV SOLN
250.0000 mg | Freq: Once | INTRAVENOUS | Status: DC
  Filled 2013-05-25: qty 5

## 2013-05-25 MED ORDER — SODIUM CHLORIDE 0.9 % IV SOLN
250.0000 mg | Freq: Once | INTRAVENOUS | Status: AC
  Administered 2013-05-25: 250 mg via INTRAVENOUS
  Filled 2013-05-25 (×2): qty 5

## 2013-05-25 MED ORDER — SODIUM CHLORIDE 0.9 % IV SOLN
25.0000 mg | Freq: Once | INTRAVENOUS | Status: DC
  Filled 2013-05-25: qty 0.5

## 2013-05-25 MED ORDER — CYANOCOBALAMIN 1000 MCG/ML IJ SOLN
1000.0000 ug | Freq: Once | INTRAMUSCULAR | Status: AC
Start: 2013-05-25 — End: 2013-05-25
  Administered 2013-05-25: 1000 ug via INTRAMUSCULAR
  Filled 2013-05-25: qty 1

## 2013-05-25 MED ORDER — DIPHENHYDRAMINE HCL 50 MG/ML IJ SOLN
50.0000 mg | Freq: Once | INTRAMUSCULAR | Status: AC
  Administered 2013-05-25: 50 mg via INTRAVENOUS
  Filled 2013-05-25: qty 1

## 2013-05-25 NOTE — Progress Notes (Signed)
4 Days Post-Op Subjective: - 67 yo male transferred to [facility] with wound problems after TURP and Subsequent Open Bladder Evacuation - Pt s/p transurethral resection of prostate with left ureteroscopic stone manipulation 04/13/13 at [facility] by [facility] MD. Subsequently developed acute renal failure, bilateral hydronephrosis, left perniephric hematoma and clot retention requiring transfer to [facility] 9/5 with urgent cystoscopy, bilateral ureteral stent placement, and open bladder evacuation with SP Tube placement by Annabell Howells MD. Has known rt renal atrophy. Was discharged and doing well and had sutures removed 9/16 by [facility]pal with subsequent opening of wound and replacement of sutures in office 9/17. Presented to [facility] ER later 9/17 with leg swelling, recurrent acute renal failure and transferred once again to [facility] hospital.  He has developed Thrombophlebitis, necessitating fibrinolysis and IVC filter, and bilateral lower extremity fasciotomies.  He now has developed gross hematuria, and Urology has been ure-consulted.   Pt notes S-p tube now in place since mid-August. He is feeling better.      Objective: Vital signs in last 24 hours: Temp:  [98.1 F (36.7 C)-98.6 F (37 C)] 98.1 F (36.7 C) (10/12 0512) Pulse Rate:  [70-92] 70 (10/12 0512) Resp:  [20] 20 (10/12 0512) BP: (120-126)/(57-60) 125/60 mmHg (10/12 0512) SpO2:  [97 %-99 %] 99 % (10/12 0512) Weight:  [126.599 kg (279 lb 1.6 oz)] 126.599 kg (279 lb 1.6 oz) (10/11 2150)  Intake/Output from previous day: 10/11 0701 - 10/12 0700 In: 3820 [P.O.:720; I.V.:3100] Out: 2951 [Urine:2950; Stool:1] Intake/Output this shift: Total I/O In: 240 [P.O.:240] Out: -   Physical Exam:  General:alert, cooperative and appears stated age GI: not done and soft, non tender, normal bowel sounds, no palpable masses, no organomegaly, no inguinal hernia. S-P tube in place. Wound pink. Non-tender.  Male genitalia: not done no penile lesions or  discharge. Testes normal.   Lab Results:  Recent Labs  05/24/13 0540 05/24/13 1659 05/25/13 0550  HGB 7.4* 8.0* 7.3*  HCT 22.9* 24.7* 22.1*   BMET  Recent Labs  05/24/13 0540 05/25/13 0550  NA 136 134*  K 4.4 4.4  CL 105 104  CO2 26 24  GLUCOSE 113* 129*  BUN 15 17  CREATININE 1.10 1.12  CALCIUM 7.5* 8.0*   No results found for this basename: LABPT, INR,  in the last 72 hours No results found for this basename: LABURIN,  in the last 72 hours Results for orders placed during the hospital encounter of 04/30/13  CULTURE, BLOOD (ROUTINE X 2)     Status: None   Collection Time    04/30/13  5:30 AM      Result Value Range Status   Specimen Description BLOOD LEFT HAND   Final   Special Requests BOTTLES DRAWN AEROBIC ONLY 3CC   Final   Culture  Setup Time     Final   Value: 04/30/2013 13:06     Performed at [facility]   Culture     Final   Value: NO GROWTH 5 DAYS     Performed at [facility]   Report Status 05/06/2013 FINAL   Final  CULTURE, BLOOD (ROUTINE X 2)     Status: None   Collection Time    04/30/13  5:35 AM      Result Value Range Status   Specimen Description BLOOD RIGHT HAND   Final   Special Requests BOTTLES DRAWN AEROBIC ONLY 4CC   Final   Culture  Setup Time     Final  Value: 04/30/2013 13:06     Performed at [facility]   Culture     Final   Value: NO GROWTH 5 DAYS     Performed at [facility]   Report Status 05/06/2013 FINAL   Final  MRSA PCR SCREENING     Status: None   Collection Time    05/11/13  6:17 PM      Result Value Range Status   MRSA by PCR NEGATIVE  NEGATIVE Final   Comment:            The GeneXpert MRSA Assay (FDA     approved for NASAL specimens     only), is one component of a     comprehensive MRSA colonization     surveillance program. It is not     intended to diagnose MRSA     infection nor to guide or     monitor treatment for     MRSA infections.  CLOSTRIDIUM DIFFICILE BY PCR      Status: Abnormal   Collection Time    05/16/13  2:34 AM      Result Value Range Status   C difficile by pcr POSITIVE (*) NEGATIVE Final   Comment: CRITICAL RESULT CALLED TO, READ BACK BY AND VERIFIED WITH:     Pennelope Bracken 05/16/13 1120 BY K SCHULTZ  MRSA PCR SCREENING     Status: None   Collection Time    05/16/13  3:26 PM      Result Value Range Status   MRSA by PCR NEGATIVE  NEGATIVE Final   Comment:            The GeneXpert MRSA Assay (FDA     approved for NASAL specimens     only), is one component of a     comprehensive MRSA colonization     surveillance program. It is not     intended to diagnose MRSA     infection nor to guide or     monitor treatment for     MRSA infections.    Studies/Results: Ct Abdomen Pelvis W Wo Contrast  05/24/2013   CLINICAL DATA:  67 year old male with hematuria. Patient with bilateral urinary stents and subcapsular hematoma. Marland Kitchen  EXAM: CT ABDOMEN AND PELVIS WITHOUT AND WITH CONTRAST  TECHNIQUE: Multidetector CT imaging of the abdomen and pelvis was performed without contrast material in one or both body regions, followed by contrast material(s) and further sections in one or both body regions. The patient was pre-medicated by the clinician due to contrast allergy history.  CONTRAST:  OMNIPAQUE IOHEXOL 300 MG/ML  SOLN  COMPARISON:  05/13/2013 and prior CTs  FINDINGS: Tiny bilateral pleural effusions are noted.  The left subcapsular renal hematoma has decreased in size, now measuring 10 mm in thickness, previously 16 mm.  Bilateral renal atrophy, moderate to severe renal cortical thinning, left renal cyst, nonobstructing left renal calculi and bilateral urinary stents are unchanged. The urinary stents are unchanged in appearance with tips in the bladder and renal collecting systems.  Mild right hydronephrosis is slightly increased since the prior study. On the delayed images, small filling defects within the right renal calices are noted and may represent  small clots, sloughed papilla (papillary necrosis, but not identified on the left side), atypical infection or less likely non opaque calculi. Other etiologies are less likely.  Suprapubic catheter and thick walled bladder are unchanged.  An infrarenal IVC filter is again identified.  The liver, spleen,  pancreas, gallbladder and adrenal glands are within normal limits.  No free fluid, enlarged lymph nodes, biliary dilatation or abdominal aortic aneurysm noted.  The bowel, bladder and appendix are unremarkable. There is no evidence of bowel obstruction, abscess or pneumoperitoneum.  No acute or suspicious bony abnormalities are identified.  IMPRESSION: Decreased left subcapsular renal hematoma, now measuring 10 mm, previously 16 mm.  Slightly increasing mild right hydronephrosis. Small filling defects within the right renal calices now noted and differential includes small clots, atypical/fungal infection or less likely non opaque calculi. Uroepithelial neoplasm is considered less likely.  Unchanged bilateral renal atrophy, nonobstructing left renal calculi, bilateral urinary stents and thick-walled bladder.   Electronically Signed   By: Laveda Abbe M.D.   On: 05/24/2013 16:43    Assessment/Plan:   S-p tube will need to be changed next week per Dr. Annabell Howells.  L subcapsular hematoma improved 33%. Small clots in the R renal calyces. JJ stents in place. Renal atrophy. Non-obstructing L renal calculi.   LOS: 25 days   Gyselle Matthew I 05/25/2013, 11:28 AM

## 2013-05-25 NOTE — Progress Notes (Signed)
[facility]                                                                                Patient Demographics  Jack [name], is a 67 y.o. male, DOB - Jun 30, 1946, XBJ:[id number]  Admit date - 04/30/2013   Admitting Physician Hillary Bow, DO  Outpatient Primary MD for the patient is [facility], MD  LOS - 25   No chief complaint on file.       Assessment & Plan  S/p transurethral resection of prostate with left ureteroscopic stone manipulation 04/13/13 complicated by perinephric hematoma with bilateral hydronephrosis  underwent urgent cystoscopy 04/18/2013 with bilateral stent placement and bladder evacuation with suprapubic tube placement  - d/w Urology 10/10 - cap suprapubic cath and follow post void residuals- if he is doing well, can ask them next week to remove the cath.  -CT showed decreasing size of hematoma -Urology consulted and planning on replacing Suprapubic catheter this week. -Collection back shows blood tinged urine, however, clearing   Bilateral lower extremity DVT / PE  -status post IVC filter 9/5 - given recent severe complications related to bleeding anticoagulation was not felt to be wise at the time of admission - further review of records revealed Dopplers from 9/7 which were negative for DVT - repeat dopplers this hospitalization confirmed extensive bilateral lower extremity DVTs - d/c'ed testosterone replacement  - due to fear of propagation of clot through IVC filter, heparin was subsequently re-intitiated on 9/22 for anticoag w/ closer monitoring -  - clot extended from lower extremities through IVC and involved filter- he underwent thrombolysis by IR- then developed compartment syndrome and was taken to OR on 10/1right calf fasciotomy  -back to OR on 10/6 to close faciotomy right leg to close - left leg had fasciotomy on 10/4 with closure 10/8 - on heparin  -No longer on Heparin, foot pumps in place  B LE compartment syndrome due to bleeding/hematoma   -S/p B/L fasciotomies - Vasc Surgery has been following -  - Oxycodone Q6 routine with PRN IV Morphine to control pain   C diff colitis  -started flagyl 10/3 for 2 wk course - diarrhea resolved  Acute renal failure/ CKD 3  -Resolved.  Cr 1.12.  (was likely due to prerenal vs ATN from Hypotension) -Nephrology signed off.   -ultrasound and Ct abd/pelvis revealed no acute findings  Progressive anemia / Iron deficiency  -Multifactorial causes: significant recent acute loss plus decreased production  -Will continue Iron supplementation -Hb 7.3 today.  Patient has received multiple transfusions during hospital course -Vit B 12- low normal, will continue to supplement   Hemorrhoidal GI bleed  -bright red blood streaking in stools 9/25 - appears to be from hemorrhoids - no further GI bleeding since 10/3- mucous stools   Hypotension -  BP has now stabilized   Hyperkalemia  resolved   Principal Problem:   Acute renal failure Active Problems:   Chronic renal insufficiency   Sepsis   Hyperkalemia   DVT, bilateral lower limbs   GI bleed   Pulmonary embolism 9/2   Code Status: Full  Family Communication: None at this time.  Disposition Plan: Admitted.  Looking for  SNF placement.   Procedures  8/29 - TURP w/ L ureteral stent [facility]  9/02 - admit [facility] w/ V/Q "high probability" for PE >> anticoag >> severe hematuria + anemia + renal failure  9/3 - CT abdom reveals large L renal capsular hematoma  9/5 - transferred to [facility] to PCCM service - noted to have large clot in bladder w/ B hydronephrosis  9/5 - IVC filter placement  9/6 - B ureteral stents placed - open evacuation of clot from bladder- suprapubic tube placed  9/7- venous duplex lower extremity - NO DVT  9/21-venous duplex lower extremity- Bilateral: Positive for DVT in the common femoral, profunda, femoral, popliteal, and posterior tibial veins with superficial thrombus in the greater and lesser  saphenous veins.  9/22 - IV heparin initiated for DVTs (anticoagulation had been on hold due to renal hematoma)  9/28: Decision was made to perform a catheter directed lysis of clot and patient was transferred to the ICU for monitoring and further care while on lytics.  9/28-Bilateral popliteal venous acc with initiation of bilateral LE and IVC thrombolysis, popliteal sheaths  9/29- IR inferior venacavagram, thrombolysis/thrombectomy bilateral lower extremities showing improved patency, IVC patent. Advised d/c lytic therapy, continue heparin per pharmacy  9/30 - CT abd/pelvis showed stable L renal hematoma  10/1- Acute hematoma rt leg below knee, compartment syndrome  10/1- calf fasciotomy performed  10/4 LEFT calf fasciotomy performed  10/6 taken back to OR to close RIGHT fasciotomy  10/8 to OR to close LEFT fasciotomy  Consults   Nephrology  Vascular Surgery  IR  Urology   DVT Prophylaxis  Foot pumps  Lab Results  Component Value Date   PLT 263 05/25/2013    Medications  Scheduled Meds: . hydrocortisone   Rectal BID  . metroNIDAZOLE  500 mg Oral Q8H  . oxyCODONE  5 mg Oral Q6H  . sodium chloride  3 mL Intravenous Q12H   Continuous Infusions: . sodium chloride 10 mL/hr at 05/22/13 1900  . sodium chloride 100 mL/hr at 05/25/13 0647   PRN Meds:.ALPRAZolam, morphine injection, ondansetron (ZOFRAN) IV  Antibiotics    Anti-infectives   Start     Dose/Rate Route Frequency Ordered Stop   05/21/13 0915  ceFAZolin (ANCEF) IVPB 2 g/50 mL premix     2 g 100 mL/hr over 30 Minutes Intravenous  Once 05/21/13 0900 05/21/13 0855   05/21/13 0823  ceFAZolin (ANCEF) 2-3 GM-% IVPB SOLR    Comments:  Hypes, Karen   : cabinet override      05/21/13 0823 05/21/13 2029   05/17/13 1024  ceFAZolin (ANCEF) 1-5 GM-% IVPB    Comments:  BUTLER, DAVE: cabinet override      05/17/13 1024 05/17/13 2229   05/17/13 1023  ceFAZolin (ANCEF) 2-3 GM-% IVPB SOLR    Comments:  BUTLER, DAVE: cabinet  override      05/17/13 1023 05/17/13 1035   05/16/13 1400  metroNIDAZOLE (FLAGYL) tablet 500 mg     500 mg Oral 3 times per day 05/16/13 1128 05/30/13 1359   05/14/13 1145  ceFAZolin (ANCEF) IVPB 2 g/50 mL premix     2 g 100 mL/hr over 30 Minutes Intravenous To Surgery 05/14/13 1141 05/14/13 1145   05/03/13 1600  piperacillin-tazobactam (ZOSYN) IVPB 3.375 g     3.375 g 12.5 mL/hr over 240 Minutes Intravenous 3 times per day 05/03/13 1222 05/04/13 0213   05/03/13 1000  vancomycin (VANCOCIN) 1,500 mg in sodium chloride 0.9 % 500 mL  IVPB  Status:  Discontinued     1,500 mg 250 mL/hr over 120 Minutes Intravenous Every 48 hours 05/01/13 1105 05/01/13 1403   05/01/13 1000  vancomycin (VANCOCIN) 1,500 mg in sodium chloride 0.9 % 500 mL IVPB  Status:  Discontinued     1,500 mg 250 mL/hr over 120 Minutes Intravenous Every 24 hours 05/01/13 0105 05/01/13 1105   04/30/13 1600  piperacillin-tazobactam (ZOSYN) IVPB 2.25 g  Status:  Discontinued     2.25 g 100 mL/hr over 30 Minutes Intravenous Every 8 hours 04/30/13 1342 05/03/13 1222   04/30/13 1200  vancomycin (VANCOCIN) IVPB 1000 mg/200 mL premix  Status:  Discontinued     1,000 mg 200 mL/hr over 60 Minutes Intravenous Every 12 hours 04/30/13 0245 04/30/13 1326   04/30/13 0800  piperacillin-tazobactam (ZOSYN) IVPB 3.375 g  Status:  Discontinued     3.375 g 12.5 mL/hr over 240 Minutes Intravenous Every 8 hours 04/30/13 0245 04/30/13 1326   04/30/13 0300  vancomycin (VANCOCIN) 2,000 mg in sodium chloride 0.9 % 500 mL IVPB     2,000 mg 250 mL/hr over 120 Minutes Intravenous  Once 04/30/13 0245 04/30/13 0510   04/30/13 0300  piperacillin-tazobactam (ZOSYN) IVPB 3.375 g     3.375 g 100 mL/hr over 30 Minutes Intravenous  Once 04/30/13 0245 04/30/13 0340       Time Spent in minutes   30 minutes   Angeliki Mates D.O. on 05/25/2013 at 10:35 AM  Between 7am to 7pm - Pager - [phone]  After 7pm go to [email or web address] - password [email or web address]  And  look for the night coverage person covering for me after hours  [facility] Office  [phone]    Subjective:   Jack [name] seen and examined today.  Patient has no complaints today. It does state that his leg pain is better. Is also stated today is his birthday and wishes he could be a home.  Patient denies any other episodes of diarrhea. Patient denies dizziness, chest pain, shortness of breath, abdominal pain, N/V/D/C, new weakness, numbess, tingling.    Objective:   Filed Vitals:   05/24/13 0454 05/24/13 1400 05/24/13 2150 05/25/13 0512  BP: 110/53 120/57 126/59 125/60  Pulse: 80 90 92 70  Temp: 98.7 F (37.1 C) 98.6 F (37 C) 98.5 F (36.9 C) 98.1 F (36.7 C)  TempSrc: Oral Oral Oral Oral  Resp: 18 20 20 20   Height:   5\' 9"  (1.753 m)   Weight:   126.599 kg (279 lb 1.6 oz)   SpO2: 99% 97% 98% 99%    Wt Readings from Last 3 Encounters:  05/24/13 126.599 kg (279 lb 1.6 oz)  05/24/13 126.599 kg (279 lb 1.6 oz)  05/24/13 126.599 kg (279 lb 1.6 oz)     Intake/Output Summary (Last 24 hours) at 05/25/13 1035 Last data filed at 05/25/13 0857  Gross per 24 hour  Intake   3820 ml  Output   2951 ml  Net    869 ml    Exam  General: Well developed, well nourished, NAD, appears stated age  HEENT: NCAT, PERRLA, EOMI, Anicteic Sclera, mucous membranes moist.   Neck: Supple, no JVD, no masses  Cardiovascular: S1 S2 auscultated, no rubs, murmurs or gallops.  Regular rate and rhythm.  Respiratory: Clear to auscultation bilaterally with equal chest rise  Abdomen: Soft, obese, nondistended, nontender, + bowel sounds, suprapubic catheter in place  Extremities: Lower extremities both are wrapped, vertical incisions noted with clear fluid slightly  oozing.  Legs rewrapped. Otherwise warm dry without cyanosis clubbing.  Edema note in lower extremities bilaterally.  Neuro: AAOx3, cranial nerves grossly intact. Strength 5/5 in patient's upper and lower extremities  bilaterally  Skin: Without rashes exudates or nodules  Psych: Normal affect and demeanor with intact judgement and insight, slightly depressed  Foley catheter bag shows blood-tinged urine    Data Review   Micro Results Recent Results (from the past 240 hour(s))  CLOSTRIDIUM DIFFICILE BY PCR     Status: Abnormal   Collection Time    05/16/13  2:34 AM      Result Value Range Status   C difficile by pcr POSITIVE (*) NEGATIVE Final   Comment: CRITICAL RESULT CALLED TO, READ BACK BY AND VERIFIED WITH:     Pennelope Bracken 05/16/13 1120 BY K SCHULTZ  MRSA PCR SCREENING     Status: None   Collection Time    05/16/13  3:26 PM      Result Value Range Status   MRSA by PCR NEGATIVE  NEGATIVE Final   Comment:            The GeneXpert MRSA Assay (FDA     approved for NASAL specimens     only), is one component of a     comprehensive MRSA colonization     surveillance program. It is not     intended to diagnose MRSA     infection nor to guide or     monitor treatment for     MRSA infections.    Radiology Reports Ct Abdomen Pelvis W Wo Contrast  05/24/2013   CLINICAL DATA:  67 year old male with hematuria. Patient with bilateral urinary stents and subcapsular hematoma. Marland Kitchen  EXAM: CT ABDOMEN AND PELVIS WITHOUT AND WITH CONTRAST  TECHNIQUE: Multidetector CT imaging of the abdomen and pelvis was performed without contrast material in one or both body regions, followed by contrast material(s) and further sections in one or both body regions. The patient was pre-medicated by the clinician due to contrast allergy history.  CONTRAST:  OMNIPAQUE IOHEXOL 300 MG/ML  SOLN  COMPARISON:  05/13/2013 and prior CTs  FINDINGS: Tiny bilateral pleural effusions are noted.  The left subcapsular renal hematoma has decreased in size, now measuring 10 mm in thickness, previously 16 mm.  Bilateral renal atrophy, moderate to severe renal cortical thinning, left renal cyst, nonobstructing left renal calculi and  bilateral urinary stents are unchanged. The urinary stents are unchanged in appearance with tips in the bladder and renal collecting systems.  Mild right hydronephrosis is slightly increased since the prior study. On the delayed images, small filling defects within the right renal calices are noted and may represent small clots, sloughed papilla (papillary necrosis, but not identified on the left side), atypical infection or less likely non opaque calculi. Other etiologies are less likely.  Suprapubic catheter and thick walled bladder are unchanged.  An infrarenal IVC filter is again identified.  The liver, spleen, pancreas, gallbladder and adrenal glands are within normal limits.  No free fluid, enlarged lymph nodes, biliary dilatation or abdominal aortic aneurysm noted.  The bowel, bladder and appendix are unremarkable. There is no evidence of bowel obstruction, abscess or pneumoperitoneum.  No acute or suspicious bony abnormalities are identified.  IMPRESSION: Decreased left subcapsular renal hematoma, now measuring 10 mm, previously 16 mm.  Slightly increasing mild right hydronephrosis. Small filling defects within the right renal calices now noted and differential includes small clots, atypical/fungal infection or less likely  non opaque calculi. Uroepithelial neoplasm is considered less likely.  Unchanged bilateral renal atrophy, nonobstructing left renal calculi, bilateral urinary stents and thick-walled bladder.   Electronically Signed   By: Laveda Abbe M.D.   On: 05/24/2013 16:43   Ct Abdomen Pelvis Wo Contrast  05/13/2013   *RADIOLOGY REPORT*  Clinical Data: reassess renal hematomas  CT ABDOMEN AND PELVIS WITHOUT CONTRAST  Technique:  Multidetector CT imaging of the abdomen and pelvis was performed following the standard protocol without intravenous contrast.  Comparison: 05/10/13  Findings: Slight increase in small bilateral pleural effusions.  Liver, gallbladder, spleen, pancreas, and adrenal glands normal  and unchanged, except for incidental punctate calcification right adrenal gland.  Bilateral ureteral stents unchanged.  16 mm thick  subcapsular hematoma left kidney stable.  Large left upper pole cyst stable. Small right exophytic mid pole cyst stable.  Suprapubic bladder catheter unchanged.  IVC filter stable.  Bowel remains unremarkable.  IMPRESSION: Stable subcapsular left renal hematoma.Small bilateral pleural effusions, mildly increased.   Original Report Authenticated By: Esperanza Heir, M.D.   Ct Abdomen Pelvis Wo Contrast  04/30/2013   *RADIOLOGY REPORT*  Clinical Data:   Acute renal failure.  CT ABDOMEN AND PELVIS WITHOUT CONTRAST  Technique:  Multidetector CT imaging of the abdomen and pelvis was performed following the standard protocol without intravenous contrast.  Comparison: CT scan from 04/16/2013  Findings: Focal atelectasis or pneumonia is seen in the posterior left base.  No focal abnormalities seen in the liver or spleen on this study performed without intravenous contrast material.  The stomach is decompressed.  The duodenum, pancreas, gallbladder, and adrenal glands are unremarkable.  Stable appearance of the 7.8 cm cyst in the upper pole of the left kidney.  6 mm nonobstructing stone is seen in the lower pole of the left kidney.  The left sub capsular hematoma has decreased in the interval measuring about 1.7 cm in thickness today compared 2.4 cm previously.  The left double-J internal ureteral stent remains in place and is stable in position. There is been interval placement of a right double-J internal ureteral stent with the proximal loop formed and upper pole calix and the lower loop formed in the urinary bladder.  IVC filter again noted.  The no abdominal aortic aneurysm.  No free fluid or lymphadenopathy in the abdomen.  Imaging through the pelvis shows no apparent midline lower abdominal wound suggesting recent surgery.  Edema or inflammation is seen in the soft tissues of the  extraperitoneal pelvic floor. Bilateral inguinal hernias contain only fat.  There is subcutaneous edema in the lower abdomen and pelvis bilaterally.  No colonic diverticulitis.  No evidence for bowel obstruction.  The patient is status post right hemicolectomy.  Urinary bladder is decompressed by a suprapubic tube.  Bone windows reveal no worrisome lytic or sclerotic osseous lesions.  IMPRESSION: Interval decrease in size of the left renal subcapsular fluid collection, suggesting resolving hematoma.  The patient has bilateral internal ureteral stents without overt hydronephrosis at this time.  There is some fullness of the right intrarenal collecting system which is stable to mildly increased in the interval.  Fullness of the left intrarenal collecting system seen previously has decreased in the interval.   Original Report Authenticated By: Kennith Center, M.D.   Dg Chest 2 View  04/27/2013   *RADIOLOGY REPORT*  Clinical Data: Chest pain, extremity numbness  CHEST - 2 VIEW  Comparison: Prior chest x-ray 04/21/2013  Findings: Stable low inspiratory volumes with perhaps trace subsegmental  atelectasis.  The lungs are otherwise clear.  Cardiac and mediastinal contours are upper limits of normal but unchanged compared to prior.  No acute osseous abnormality.  Remote healed right-sided rib fractures.  No pleural effusion or pneumothorax.  IMPRESSION: Low inspiratory volumes, but otherwise no acute cardiopulmonary process.   Original Report Authenticated By: Malachy Moan, M.D.   Ir Veno/ext/bi  05/11/2013   *RADIOLOGY REPORT*  PRIOR ULTRASOUND GUIDED VASCULAR ACCESS; IR INFERIOR VENA CAVA GRAM; IR THROMBOLYSIS/THROMBECTOMY BILATERAL LOWER EXTREMITIES; IR INITIATION OF VENOUS LYSIS INITIAL DAY  Date: 05/11/2013  Clinical History: 68 year old male with complicated medical history.  He had a TURP and left double-J ureteral stent placement procedure done in the end of August which was complicated by postoperative PE  and subsequent postoperative hemorrhage following initiation of anticoagulation.  Hemostasis was obtained and IVC filter was placed.  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  He is now nearly 4 weeks post operative and showing in significant improvement with systemic heparinization.  He presents for attempted pharmacomechanical catheter directed thrombolysis/thrombectomy.  Procedures Performed: 1. Ultrasound-guided puncture of the left popliteal vein 2.  Catheterization of the inferior vena cava above the IVC filter with vena cava gram 3.  Power pulse spray infusion of 6 mg of TPA from the popliteal access to the infrarenal IVC 4.  Placement of a 90 cm total length 50 cm infusion length multi side-hole infusion catheter 5.  Ultrasound-guided puncture of the right popliteal vein 6.  Catheterization of the inferior vena cava above the IVC filter 7.  Power pulse spray infusion of 6 mg of TPA from the right popliteal access into the infrarenal IVC 8.  Placement of a 130 cm total length 50 cm infusion length multi side-hole infusion catheter 9.  Initiation of venous thrombolysis  Interventional Radiologist:  Sterling Big, MD  Sedation: Moderate (conscious) sedation was used.  Four mg Versed, 100 mcg Fentanyl were administered intravenously.  The patient's vital signs were monitored continuously by radiology nursing throughout the procedure.  Sedation Time: 60 minutes  Fluoroscopy time: 15 minutes 6 seconds  Contrast volume: 10 ml Omnipaque-300  Intravenous medications:  A total of 12 mg TPA was administered into the thrombus  PROCEDURE/FINDINGS:   Informed consent was obtained from the patient following explanation of the procedure, risks, benefits and alternatives. The patient understands, agrees and consents for the procedure. All questions were addressed. A time out was  performed.  Maximal barrier sterile technique utilized including caps, mask, sterile gowns, sterile gloves, large sterile drape, hand hygiene, and betadine skin prep.  The left popliteal fossa was interrogated with ultrasound.  The popliteal vein is expanded and completely thrombosed.  Local anesthesia was attained by infiltration with 1% lidocaine.  Under real time sonographic guidance, the thrombosed vein was punctured with a 21-gauge micropuncture needle.  Images obtained stored for the medical record.  The transitional micro sheath was upsized to a 6-French working vascular sheath over a short Amplatz wire.  Using a stiff Glidewire and angled catheter, the catheter was used to navigate into the infrarenal IVC just beyond the nose cone of the IVC filter.  An inferior vena cava gram was performed limiting the amount of intravenous contrast used.  The left renal vein and inferior vena cava are patent.  Inflow from the right renal vein is also noted indicating patency.  The wire was advanced into the  suprarenal IVC.  The 6-French 90 cm length AngioJet device was then used to power pulse spray a total of 6 mg of TPA and 125 ml of saline from the popliteal vein throughout the left lower extremity and into the IVC filter.  A 90 cm total length, 50 cm infusion length Unifuse infusion catheter was then positioned over the wire so that the proximal side hole was just within the filter, and the more proximal side hole in the mid thigh.  The sheath and catheter were secured to the skin with post silk suture.  The right popliteal fossa was interrogated with ultrasound.  Again, the popliteal vein was found be expanded completely thrombosed. Local anesthesia was attained by infiltration with 1% lidocaine. Under real time sonographic guidance, the vein was punctured using a 21-gauge micropuncture needle.  Images obtained stored for the medical record.  A 6-French working vascular sheath was then placed over a stiff Amplatz wire  after transitioning from the transitional micro sheath.  The wire was advanced into the suprarenal IVC.  Care was taken to navigate the wire through the right aspect of the IVC and the filter to maximize lytic dispersal.  The 6-French AngioJet was then used to power pulse spray a total of 6 mg of TPA and 125 ml of saline from the popliteal artery into the IVC.  A 130 cm total length 50 cm infusion length Unifuse multi side-hole infusion catheter was then placed in identical position to the contralateral side.  Initial venous lysis was initiated at 0.25 mg connector place through each catheter for a total of 0.5 mg per hour.  Overall, the patient tolerated the procedure very well.  There was no immediate complication.  IMPRESSION:  1.  Inferior venacavagram demonstrates patency of the renal veins and IVC above the level of the filter.  2. Initial limited bilateral lower extremity pharmacomechanical thrombolysis.  3.  Initiation of bilateral lower extremity venous lysis using bilateral multi side-hole infusion catheters.  The patient will lyse for 12-24 hours and return interventional radiology for venogram and potential adjunctive interventions tomorrow.  Signed,  Sterling Big, MD [facility]   Original Report Authenticated By: Malachy Moan, M.D.   Ir Caffie Damme Ivc  05/11/2013   *RADIOLOGY REPORT*  PRIOR ULTRASOUND GUIDED VASCULAR ACCESS; IR INFERIOR VENA CAVA GRAM; IR THROMBOLYSIS/THROMBECTOMY BILATERAL LOWER EXTREMITIES; IR INITIATION OF VENOUS LYSIS INITIAL DAY  Date: 05/11/2013  Clinical History: 67 year old male with complicated medical history.  He had a TURP and left double-J ureteral stent placement procedure done in the end of August which was complicated by postoperative PE and subsequent postoperative hemorrhage following initiation of anticoagulation.  Hemostasis was obtained and IVC filter was placed.  The patient subsequently  developed extensive bilateral lower extremity swelling and was found to have extensive bilateral lower extremity and caval DVT extending into the filter.  He is now nearly 4 weeks post operative and showing in significant improvement with systemic heparinization.  He presents for attempted pharmacomechanical catheter directed thrombolysis/thrombectomy.  Procedures Performed: 1. Ultrasound-guided puncture of the left popliteal vein 2.  Catheterization of the inferior vena cava above the IVC filter with vena cava gram 3.  Power pulse spray infusion of 6 mg of TPA from the popliteal access to the infrarenal IVC 4.  Placement of a 90 cm total length 50 cm infusion length multi side-hole infusion catheter 5.  Ultrasound-guided puncture of the right popliteal vein 6.  Catheterization of the inferior vena  cava above the IVC filter 7.  Power pulse spray infusion of 6 mg of TPA from the right popliteal access into the infrarenal IVC 8.  Placement of a 130 cm total length 50 cm infusion length multi side-hole infusion catheter 9.  Initiation of venous thrombolysis  Interventional Radiologist:  Sterling Big, MD  Sedation: Moderate (conscious) sedation was used.  Four mg Versed, 100 mcg Fentanyl were administered intravenously.  The patient's vital signs were monitored continuously by radiology nursing throughout the procedure.  Sedation Time: 60 minutes  Fluoroscopy time: 15 minutes 6 seconds  Contrast volume: 10 ml Omnipaque-300  Intravenous medications:  A total of 12 mg TPA was administered into the thrombus  PROCEDURE/FINDINGS:   Informed consent was obtained from the patient following explanation of the procedure, risks, benefits and alternatives. The patient understands, agrees and consents for the procedure. All questions were addressed. A time out was performed.  Maximal barrier sterile technique utilized including caps, mask, sterile gowns, sterile gloves, large sterile drape, hand hygiene, and betadine skin  prep.  The left popliteal fossa was interrogated with ultrasound.  The popliteal vein is expanded and completely thrombosed.  Local anesthesia was attained by infiltration with 1% lidocaine.  Under real time sonographic guidance, the thrombosed vein was punctured with a 21-gauge micropuncture needle.  Images obtained stored for the medical record.  The transitional micro sheath was upsized to a 6-French working vascular sheath over a short Amplatz wire.  Using a stiff Glidewire and angled catheter, the catheter was used to navigate into the infrarenal IVC just beyond the nose cone of the IVC filter.  An inferior vena cava gram was performed limiting the amount of intravenous contrast used.  The left renal vein and inferior vena cava are patent.  Inflow from the right renal vein is also noted indicating patency.  The wire was advanced into the suprarenal IVC.  The 6-French 90 cm length AngioJet device was then used to power pulse spray a total of 6 mg of TPA and 125 ml of saline from the popliteal vein throughout the left lower extremity and into the IVC filter.  A 90 cm total length, 50 cm infusion length Unifuse infusion catheter was then positioned over the wire so that the proximal side hole was just within the filter, and the more proximal side hole in the mid thigh.  The sheath and catheter were secured to the skin with post silk suture.  The right popliteal fossa was interrogated with ultrasound.  Again, the popliteal vein was found be expanded completely thrombosed. Local anesthesia was attained by infiltration with 1% lidocaine. Under real time sonographic guidance, the vein was punctured using a 21-gauge micropuncture needle.  Images obtained stored for the medical record.  A 6-French working vascular sheath was then placed over a stiff Amplatz wire after transitioning from the transitional micro sheath.  The wire was advanced into the suprarenal IVC.  Care was taken to navigate the wire through the right  aspect of the IVC and the filter to maximize lytic dispersal.  The 6-French AngioJet was then used to power pulse spray a total of 6 mg of TPA and 125 ml of saline from the popliteal artery into the IVC.  A 130 cm total length 50 cm infusion length Unifuse multi side-hole infusion catheter was then placed in identical position to the contralateral side.  Initial venous lysis was initiated at 0.25 mg connector place through each catheter for a total of 0.5 mg per  hour.  Overall, the patient tolerated the procedure very well.  There was no immediate complication.  IMPRESSION:  1.  Inferior venacavagram demonstrates patency of the renal veins and IVC above the level of the filter.  2. Initial limited bilateral lower extremity pharmacomechanical thrombolysis.  3.  Initiation of bilateral lower extremity venous lysis using bilateral multi side-hole infusion catheters.  The patient will lyse for 12-24 hours and return interventional radiology for venogram and potential adjunctive interventions tomorrow.  Signed,  Sterling Big, MD [facility] Radiology   Original Report Authenticated By: Malachy Moan, M.D.   US Renal  04/30/2013   CLINICAL DATA:  Acute renal failure.  EXAM: RENAL/URINARY TRACT ULTRASOUND COMPLETE  COMPARISON:  04/18/2013  FINDINGS: Right Kidney  Length: 10.7 cm. Mild right hydronephrosis. Stent is visualized within the dilated renal pelvis. 1.4 cm cyst.  Left Kidney  Length: 14.0 cm. Slight pelvicaliectasis. Stent cannot be visualized.  7.1 cm cystic mass in the upper pole of the left kidney. This likely represents a benign cyst and is stable since prior study.  Bladder:  Cannot visualize.  IMPRESSION: Mild right hydronephrosis and slight pelvicaliectasis on the left. These findings have improved since prior study.  Bilateral renal cysts.   Electronically Signed   By: Charlett Nose M.D.   On: 04/30/2013 16:58   Ir Thrombect Veno Mech Mod  Sed  05/11/2013   *RADIOLOGY REPORT*  PRIOR ULTRASOUND GUIDED VASCULAR ACCESS; IR INFERIOR VENA CAVA GRAM; IR THROMBOLYSIS/THROMBECTOMY BILATERAL LOWER EXTREMITIES; IR INITIATION OF VENOUS LYSIS INITIAL DAY  Date: 05/11/2013  Clinical History: 67 year old male with complicated medical history.  He had a TURP and left double-J ureteral stent placement procedure done in the end of August which was complicated by postoperative PE and subsequent postoperative hemorrhage following initiation of anticoagulation.  Hemostasis was obtained and IVC filter was placed.  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  He is now nearly 4 weeks post operative and showing in significant improvement with systemic heparinization.  He presents for attempted pharmacomechanical catheter directed thrombolysis/thrombectomy.  Procedures Performed: 1. Ultrasound-guided puncture of the left popliteal vein 2.  Catheterization of the inferior vena cava above the IVC filter with vena cava gram 3.  Power pulse spray infusion of 6 mg of TPA from the popliteal access to the infrarenal IVC 4.  Placement of a 90 cm total length 50 cm infusion length multi side-hole infusion catheter 5.  Ultrasound-guided puncture of the right popliteal vein 6.  Catheterization of the inferior vena cava above the IVC filter 7.  Power pulse spray infusion of 6 mg of TPA from the right popliteal access into the infrarenal IVC 8.  Placement of a 130 cm total length 50 cm infusion length multi side-hole infusion catheter 9.  Initiation of venous thrombolysis  Interventional Radiologist:  Sterling Big, MD  Sedation: Moderate (conscious) sedation was used.  Four mg Versed, 100 mcg Fentanyl were administered intravenously.  The patient's vital signs were monitored continuously by radiology nursing throughout the procedure.  Sedation  Time: 60 minutes  Fluoroscopy time: 15 minutes 6 seconds  Contrast volume: 10 ml Omnipaque-300  Intravenous medications:  A total of 12 mg TPA was administered into the thrombus  PROCEDURE/FINDINGS:   Informed consent was obtained from the patient following explanation of the procedure, risks, benefits and alternatives. The patient understands, agrees and consents  for the procedure. All questions were addressed. A time out was performed.  Maximal barrier sterile technique utilized including caps, mask, sterile gowns, sterile gloves, large sterile drape, hand hygiene, and betadine skin prep.  The left popliteal fossa was interrogated with ultrasound.  The popliteal vein is expanded and completely thrombosed.  Local anesthesia was attained by infiltration with 1% lidocaine.  Under real time sonographic guidance, the thrombosed vein was punctured with a 21-gauge micropuncture needle.  Images obtained stored for the medical record.  The transitional micro sheath was upsized to a 6-French working vascular sheath over a short Amplatz wire.  Using a stiff Glidewire and angled catheter, the catheter was used to navigate into the infrarenal IVC just beyond the nose cone of the IVC filter.  An inferior vena cava gram was performed limiting the amount of intravenous contrast used.  The left renal vein and inferior vena cava are patent.  Inflow from the right renal vein is also noted indicating patency.  The wire was advanced into the suprarenal IVC.  The 6-French 90 cm length AngioJet device was then used to power pulse spray a total of 6 mg of TPA and 125 ml of saline from the popliteal vein throughout the left lower extremity and into the IVC filter.  A 90 cm total length, 50 cm infusion length Unifuse infusion catheter was then positioned over the wire so that the proximal side hole was just within the filter, and the more proximal side hole in the mid thigh.  The sheath and catheter were secured to the skin with post silk  suture.  The right popliteal fossa was interrogated with ultrasound.  Again, the popliteal vein was found be expanded completely thrombosed. Local anesthesia was attained by infiltration with 1% lidocaine. Under real time sonographic guidance, the vein was punctured using a 21-gauge micropuncture needle.  Images obtained stored for the medical record.  A 6-French working vascular sheath was then placed over a stiff Amplatz wire after transitioning from the transitional micro sheath.  The wire was advanced into the suprarenal IVC.  Care was taken to navigate the wire through the right aspect of the IVC and the filter to maximize lytic dispersal.  The 6-French AngioJet was then used to power pulse spray a total of 6 mg of TPA and 125 ml of saline from the popliteal artery into the IVC.  A 130 cm total length 50 cm infusion length Unifuse multi side-hole infusion catheter was then placed in identical position to the contralateral side.  Initial venous lysis was initiated at 0.25 mg connector place through each catheter for a total of 0.5 mg per hour.  Overall, the patient tolerated the procedure very well.  There was no immediate complication.  IMPRESSION:  1.  Inferior venacavagram demonstrates patency of the renal veins and IVC above the level of the filter.  2. Initial limited bilateral lower extremity pharmacomechanical thrombolysis.  3.  Initiation of bilateral lower extremity venous lysis using bilateral multi side-hole infusion catheters.  The patient will lyse for 12-24 hours and return interventional radiology for venogram and potential adjunctive interventions tomorrow.  Signed,  Sterling Big, MD [facility] Radiology   Original Report Authenticated By: Malachy Moan, M.D.   Ir Thrombect Veno Mech Mod Sed  05/11/2013   *RADIOLOGY REPORT*  PRIOR ULTRASOUND GUIDED VASCULAR ACCESS; IR INFERIOR VENA CAVA GRAM; IR THROMBOLYSIS/THROMBECTOMY BILATERAL LOWER  EXTREMITIES; IR INITIATION OF VENOUS LYSIS INITIAL DAY  Date: 05/11/2013  Clinical  History: 67 year old male with complicated medical history.  He had a TURP and left double-J ureteral stent placement procedure done in the end of August which was complicated by postoperative PE and subsequent postoperative hemorrhage following initiation of anticoagulation.  Hemostasis was obtained and IVC filter was placed.  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  He is now nearly 4 weeks post operative and showing in significant improvement with systemic heparinization.  He presents for attempted pharmacomechanical catheter directed thrombolysis/thrombectomy.  Procedures Performed: 1. Ultrasound-guided puncture of the left popliteal vein 2.  Catheterization of the inferior vena cava above the IVC filter with vena cava gram 3.  Power pulse spray infusion of 6 mg of TPA from the popliteal access to the infrarenal IVC 4.  Placement of a 90 cm total length 50 cm infusion length multi side-hole infusion catheter 5.  Ultrasound-guided puncture of the right popliteal vein 6.  Catheterization of the inferior vena cava above the IVC filter 7.  Power pulse spray infusion of 6 mg of TPA from the right popliteal access into the infrarenal IVC 8.  Placement of a 130 cm total length 50 cm infusion length multi side-hole infusion catheter 9.  Initiation of venous thrombolysis  Interventional Radiologist:  Sterling Big, MD  Sedation: Moderate (conscious) sedation was used.  Four mg Versed, 100 mcg Fentanyl were administered intravenously.  The patient's vital signs were monitored continuously by radiology nursing throughout the procedure.  Sedation Time: 60 minutes  Fluoroscopy time: 15 minutes 6 seconds  Contrast volume: 10 ml Omnipaque-300  Intravenous medications:  A total of 12 mg TPA was  administered into the thrombus  PROCEDURE/FINDINGS:   Informed consent was obtained from the patient following explanation of the procedure, risks, benefits and alternatives. The patient understands, agrees and consents for the procedure. All questions were addressed. A time out was performed.  Maximal barrier sterile technique utilized including caps, mask, sterile gowns, sterile gloves, large sterile drape, hand hygiene, and betadine skin prep.  The left popliteal fossa was interrogated with ultrasound.  The popliteal vein is expanded and completely thrombosed.  Local anesthesia was attained by infiltration with 1% lidocaine.  Under real time sonographic guidance, the thrombosed vein was punctured with a 21-gauge micropuncture needle.  Images obtained stored for the medical record.  The transitional micro sheath was upsized to a 6-French working vascular sheath over a short Amplatz wire.  Using a stiff Glidewire and angled catheter, the catheter was used to navigate into the infrarenal IVC just beyond the nose cone of the IVC filter.  An inferior vena cava gram was performed limiting the amount of intravenous contrast used.  The left renal vein and inferior vena cava are patent.  Inflow from the right renal vein is also noted indicating patency.  The wire was advanced into the suprarenal IVC.  The 6-French 90 cm length AngioJet device was then used to power pulse spray a total of 6 mg of TPA and 125 ml of saline from the popliteal vein throughout the left lower extremity and into the IVC filter.  A 90 cm total length, 50 cm infusion length Unifuse infusion catheter was then positioned over the wire so that the proximal side hole was just within the filter, and the more proximal side hole in the mid thigh.  The sheath and catheter were secured to the skin with post silk suture.  The right popliteal fossa was interrogated with ultrasound.  Again, the popliteal vein was found be expanded completely thrombosed. Local  anesthesia was attained by infiltration with 1% lidocaine. Under real time sonographic guidance, the vein was punctured using a 21-gauge micropuncture needle.  Images obtained stored for the medical record.  A 6-French working vascular sheath was then placed over a stiff Amplatz wire after transitioning from the transitional micro sheath.  The wire was advanced into the suprarenal IVC.  Care was taken to navigate the wire through the right aspect of the IVC and the filter to maximize lytic dispersal.  The 6-French AngioJet was then used to power pulse spray a total of 6 mg of TPA and 125 ml of saline from the popliteal artery into the IVC.  A 130 cm total length 50 cm infusion length Unifuse multi side-hole infusion catheter was then placed in identical position to the contralateral side.  Initial venous lysis was initiated at 0.25 mg connector place through each catheter for a total of 0.5 mg per hour.  Overall, the patient tolerated the procedure very well.  There was no immediate complication.  IMPRESSION:  1.  Inferior venacavagram demonstrates patency of the renal veins and IVC above the level of the filter.  2. Initial limited bilateral lower extremity pharmacomechanical thrombolysis.  3.  Initiation of bilateral lower extremity venous lysis using bilateral multi side-hole infusion catheters.  The patient will lyse for 12-24 hours and return interventional radiology for venogram and potential adjunctive interventions tomorrow.  Signed,  Sterling Big, MD [facility] Radiology   Original Report Authenticated By: Malachy Moan, M.D.   Ir US Guide Vasc Access Left  05/11/2013   *RADIOLOGY REPORT*  PRIOR ULTRASOUND GUIDED VASCULAR ACCESS; IR INFERIOR VENA CAVA GRAM; IR THROMBOLYSIS/THROMBECTOMY BILATERAL LOWER EXTREMITIES; IR INITIATION OF VENOUS LYSIS INITIAL DAY  Date: 05/11/2013  Clinical History: 67 year old male with complicated medical history.  He  had a TURP and left double-J ureteral stent placement procedure done in the end of August which was complicated by postoperative PE and subsequent postoperative hemorrhage following initiation of anticoagulation.  Hemostasis was obtained and IVC filter was placed.  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  He is now nearly 4 weeks post operative and showing in significant improvement with systemic heparinization.  He presents for attempted pharmacomechanical catheter directed thrombolysis/thrombectomy.  Procedures Performed: 1. Ultrasound-guided puncture of the left popliteal vein 2.  Catheterization of the inferior vena cava above the IVC filter with vena cava gram 3.  Power pulse spray infusion of 6 mg of TPA from the popliteal access to the infrarenal IVC 4.  Placement of a 90 cm total length 50 cm infusion length multi side-hole infusion catheter 5.  Ultrasound-guided puncture of the right popliteal vein 6.  Catheterization of the inferior vena cava above the IVC filter 7.  Power pulse spray infusion of 6 mg of TPA from the right popliteal access into the infrarenal IVC 8.  Placement of a 130 cm total length 50 cm infusion length multi side-hole infusion catheter 9.  Initiation of venous thrombolysis  Interventional Radiologist:  Sterling Big, MD  Sedation: Moderate (conscious) sedation was used.  Four mg Versed, 100 mcg Fentanyl were administered intravenously.  The patient's vital signs were monitored continuously by radiology nursing throughout the procedure.  Sedation Time: 60 minutes  Fluoroscopy time:  15 minutes 6 seconds  Contrast volume: 10 ml Omnipaque-300  Intravenous medications:  A total of 12 mg TPA was administered into the thrombus  PROCEDURE/FINDINGS:   Informed consent was obtained from the patient following explanation of the procedure, risks, benefits  and alternatives. The patient understands, agrees and consents for the procedure. All questions were addressed. A time out was performed.  Maximal barrier sterile technique utilized including caps, mask, sterile gowns, sterile gloves, large sterile drape, hand hygiene, and betadine skin prep.  The left popliteal fossa was interrogated with ultrasound.  The popliteal vein is expanded and completely thrombosed.  Local anesthesia was attained by infiltration with 1% lidocaine.  Under real time sonographic guidance, the thrombosed vein was punctured with a 21-gauge micropuncture needle.  Images obtained stored for the medical record.  The transitional micro sheath was upsized to a 6-French working vascular sheath over a short Amplatz wire.  Using a stiff Glidewire and angled catheter, the catheter was used to navigate into the infrarenal IVC just beyond the nose cone of the IVC filter.  An inferior vena cava gram was performed limiting the amount of intravenous contrast used.  The left renal vein and inferior vena cava are patent.  Inflow from the right renal vein is also noted indicating patency.  The wire was advanced into the suprarenal IVC.  The 6-French 90 cm length AngioJet device was then used to power pulse spray a total of 6 mg of TPA and 125 ml of saline from the popliteal vein throughout the left lower extremity and into the IVC filter.  A 90 cm total length, 50 cm infusion length Unifuse infusion catheter was then positioned over the wire so that the proximal side hole was just within the filter, and the more proximal side hole in the mid thigh.  The sheath and catheter were secured to the skin with post silk suture.  The right popliteal fossa was interrogated with ultrasound.  Again, the popliteal vein was found be expanded completely thrombosed. Local anesthesia was attained by infiltration with 1% lidocaine. Under real time sonographic guidance, the vein was punctured using a 21-gauge micropuncture  needle.  Images obtained stored for the medical record.  A 6-French working vascular sheath was then placed over a stiff Amplatz wire after transitioning from the transitional micro sheath.  The wire was advanced into the suprarenal IVC.  Care was taken to navigate the wire through the right aspect of the IVC and the filter to maximize lytic dispersal.  The 6-French AngioJet was then used to power pulse spray a total of 6 mg of TPA and 125 ml of saline from the popliteal artery into the IVC.  A 130 cm total length 50 cm infusion length Unifuse multi side-hole infusion catheter was then placed in identical position to the contralateral side.  Initial venous lysis was initiated at 0.25 mg connector place through each catheter for a total of 0.5 mg per hour.  Overall, the patient tolerated the procedure very well.  There was no immediate complication.  IMPRESSION:  1.  Inferior venacavagram demonstrates patency of the renal veins and IVC above the level of the filter.  2. Initial limited bilateral lower extremity pharmacomechanical thrombolysis.  3.  Initiation of bilateral lower extremity venous lysis using bilateral multi side-hole infusion catheters.  The patient will lyse for 12-24 hours and return interventional radiology for venogram and potential adjunctive interventions tomorrow.  Signed,  Sterling Big, MD [facility] Radiology  Original Report Authenticated By: Malachy Moan, M.D.   Ir US Guide Vasc Access Right  05/11/2013   *RADIOLOGY REPORT*  PRIOR ULTRASOUND GUIDED VASCULAR ACCESS; IR INFERIOR VENA CAVA GRAM; IR THROMBOLYSIS/THROMBECTOMY BILATERAL LOWER EXTREMITIES; IR INITIATION OF VENOUS LYSIS INITIAL DAY  Date: 05/11/2013  Clinical History: 67 year old male with complicated medical history.  He had a TURP and left double-J ureteral stent placement procedure done in the end of August which was complicated by postoperative PE and subsequent  postoperative hemorrhage following initiation of anticoagulation.  Hemostasis was obtained and IVC filter was placed.  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  He is now nearly 4 weeks post operative and showing in significant improvement with systemic heparinization.  He presents for attempted pharmacomechanical catheter directed thrombolysis/thrombectomy.  Procedures Performed: 1. Ultrasound-guided puncture of the left popliteal vein 2.  Catheterization of the inferior vena cava above the IVC filter with vena cava gram 3.  Power pulse spray infusion of 6 mg of TPA from the popliteal access to the infrarenal IVC 4.  Placement of a 90 cm total length 50 cm infusion length multi side-hole infusion catheter 5.  Ultrasound-guided puncture of the right popliteal vein 6.  Catheterization of the inferior vena cava above the IVC filter 7.  Power pulse spray infusion of 6 mg of TPA from the right popliteal access into the infrarenal IVC 8.  Placement of a 130 cm total length 50 cm infusion length multi side-hole infusion catheter 9.  Initiation of venous thrombolysis  Interventional Radiologist:  Sterling Big, MD  Sedation: Moderate (conscious) sedation was used.  Four mg Versed, 100 mcg Fentanyl were administered intravenously.  The patient's vital signs were monitored continuously by radiology nursing throughout the procedure.  Sedation Time: 60 minutes  Fluoroscopy time: 15 minutes 6 seconds  Contrast volume: 10 ml Omnipaque-300  Intravenous medications:  A total of 12 mg TPA was administered into the thrombus  PROCEDURE/FINDINGS:   Informed consent was obtained from the patient following explanation of the procedure, risks, benefits and alternatives. The patient understands, agrees and consents for the procedure. All questions were addressed. A time out was performed.  Maximal  barrier sterile technique utilized including caps, mask, sterile gowns, sterile gloves, large sterile drape, hand hygiene, and betadine skin prep.  The left popliteal fossa was interrogated with ultrasound.  The popliteal vein is expanded and completely thrombosed.  Local anesthesia was attained by infiltration with 1% lidocaine.  Under real time sonographic guidance, the thrombosed vein was punctured with a 21-gauge micropuncture needle.  Images obtained stored for the medical record.  The transitional micro sheath was upsized to a 6-French working vascular sheath over a short Amplatz wire.  Using a stiff Glidewire and angled catheter, the catheter was used to navigate into the infrarenal IVC just beyond the nose cone of the IVC filter.  An inferior vena cava gram was performed limiting the amount of intravenous contrast used.  The left renal vein and inferior vena cava are patent.  Inflow from the right renal vein is also noted indicating patency.  The wire was advanced into the suprarenal IVC.  The 6-French 90 cm length AngioJet device was then used to power pulse spray a total of 6 mg of TPA and 125 ml of saline from the popliteal vein throughout the left lower extremity and into the IVC filter.  A 90 cm total  length, 50 cm infusion length Unifuse infusion catheter was then positioned over the wire so that the proximal side hole was just within the filter, and the more proximal side hole in the mid thigh.  The sheath and catheter were secured to the skin with post silk suture.  The right popliteal fossa was interrogated with ultrasound.  Again, the popliteal vein was found be expanded completely thrombosed. Local anesthesia was attained by infiltration with 1% lidocaine. Under real time sonographic guidance, the vein was punctured using a 21-gauge micropuncture needle.  Images obtained stored for the medical record.  A 6-French working vascular sheath was then placed over a stiff Amplatz wire after transitioning  from the transitional micro sheath.  The wire was advanced into the suprarenal IVC.  Care was taken to navigate the wire through the right aspect of the IVC and the filter to maximize lytic dispersal.  The 6-French AngioJet was then used to power pulse spray a total of 6 mg of TPA and 125 ml of saline from the popliteal artery into the IVC.  A 130 cm total length 50 cm infusion length Unifuse multi side-hole infusion catheter was then placed in identical position to the contralateral side.  Initial venous lysis was initiated at 0.25 mg connector place through each catheter for a total of 0.5 mg per hour.  Overall, the patient tolerated the procedure very well.  There was no immediate complication.  IMPRESSION:  1.  Inferior venacavagram demonstrates patency of the renal veins and IVC above the level of the filter.  2. Initial limited bilateral lower extremity pharmacomechanical thrombolysis.  3.  Initiation of bilateral lower extremity venous lysis using bilateral multi side-hole infusion catheters.  The patient will lyse for 12-24 hours and return interventional radiology for venogram and potential adjunctive interventions tomorrow.  Signed,  Sterling Big, MD [facility]   Original Report Authenticated By: Malachy Moan, M.D.   Dg Chest Port 1 View  05/13/2013   CLINICAL DATA:  Shortness of breath. Atelectasis.  EXAM: PORTABLE CHEST - 1 VIEW  COMPARISON:  04/29/2013  FINDINGS: Slight improvement in aeration and lung volumes. No confluent opacities currently. Heart is upper limits normal in size. No effusions. No acute bony abnormality.  IMPRESSION: No active disease.   Electronically Signed   By: Charlett Nose M.D.   On: 05/13/2013 05:27   Dg Knee Left Port  05/16/2013   CLINICAL DATA:  Pain and swelling  EXAM: PORTABLE LEFT KNEE - 1-2 VIEW  COMPARISON:  None.  FINDINGS: There is no evidence of fracture or dislocation. Trace suprapatellar fluid  identified. There is no focal bone abnormality. Soft tissues are remarkable for mild edema diffusely. Tricompartmental degenerative change noted.  IMPRESSION: Degenerative change without acute osseous abnormality.   Electronically Signed   By: Christiana Pellant M.D.   On: 05/16/2013 21:47   Ct Angio Abd/pel W/ And/or W/o  05/10/2013   *RADIOLOGY REPORT*  Clinical Data: Extensive bilateral DVT, evaluate for caval thrombus.  CT ANGIOGRAPHY ABDOMEN AND PELVIS WITH CONTRAST AND WITHOUT CONTRAST  Technique:  Multidetector CT imaging of the abdomen and pelvis was performed following the standard protocol during bolus administration of intravenous contrast.  Comparison: Prior CT abdomen/pelvis 04/30/2013  Findings:  Lower Chest:  Mild dependent atelectasis versus scarring in the posterior left lower lobe.  The lung bases are otherwise clear. Visualized heart within normal limits for size.  No pericardial effusion.  Unremarkable distal thoracic esophagus.  Abdomen:  Unremarkable CT appearance of the stomach, duodenum, spleen, pancreas and left adrenal gland.  Dystrophic calcification in the right adrenal gland is nonspecific, but stable.  Normal hepatic contours and morphology.  No focal hepatic mass. Gallbladder is unremarkable. No intra or extrahepatic biliary ductal dilatation.  Stable 7.7 cm simple cyst exophytic from the upper pole of the left kidney.  Stable low attenuation left subcapsular fluid collection, likely a liquefied subcapsular hematoma.  No hydronephrosis. Bilateral double J ureteral stents are in place.  There are multiple small parapelvic cysts in the right kidney.  Small exophytic renal cortical cyst on the right.  Symmetric bilateral renal perfusion.  No bowel obstruction or focal bowel wall thickening.  No free fluid or suspicious adenopathy.  Pelvis: The bladder is decompressed.  There is a suprapubic catheter as well as to double-J ureteral stents.  Surgical changes of prior TURP.  No free fluid or  suspicious adenopathy.  Bones: No acute fracture or aggressive appearing lytic or blastic osseous lesion.  Vascular: Extensive thrombus beginning in the inferior vena cava contained within the IVC filter and extending through out of the bilateral iliac and femoral venous systems.  Thrombus likely extends into the superficial great saphenous veins bilaterally as well.  No significant provocation superior to the filter.  The renal veins are patent bilaterally.  Mild scattered atherosclerotic vascular calcifications.  No acute arterial abnormality.  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  No evidence of propagation more centrally within the inferior vena cava.  The bilateral renal veins remain widely patent.  Additional ancillary findings as above without significant interval change.   Original Report Authenticated By: Malachy Moan, M.D.   Ir Infusion Thrombol Venous Initial (ms)  05/11/2013   *RADIOLOGY REPORT*  PRIOR ULTRASOUND GUIDED VASCULAR ACCESS; IR INFERIOR VENA CAVA GRAM; IR THROMBOLYSIS/THROMBECTOMY BILATERAL LOWER EXTREMITIES; IR INITIATION OF VENOUS LYSIS INITIAL DAY  Date: 05/11/2013  Clinical History: 66 year old male with complicated medical history.  He had a TURP and left double-J ureteral stent placement procedure done in the end of August which was complicated by postoperative PE and subsequent postoperative hemorrhage following initiation of anticoagulation.  Hemostasis was obtained and IVC filter was placed.  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  He is now nearly 4 weeks post operative and showing in significant improvement with systemic heparinization.  He presents for attempted pharmacomechanical catheter directed thrombolysis/thrombectomy.  Procedures Performed: 1.  Ultrasound-guided puncture of the left popliteal vein 2.  Catheterization of the inferior vena cava above the IVC filter with vena cava gram 3.  Power pulse spray infusion of 6 mg of TPA from the popliteal access to the infrarenal IVC 4.  Placement of a 90 cm total length 50 cm infusion length multi side-hole infusion catheter 5.  Ultrasound-guided puncture of the right popliteal vein 6.  Catheterization of the inferior vena cava above the IVC filter 7.  Power pulse spray infusion of 6 mg of TPA from the right popliteal access into the infrarenal IVC 8.  Placement of a 130 cm total length 50 cm infusion length multi side-hole infusion catheter 9.  Initiation of venous thrombolysis  Interventional Radiologist:  Sterling Big, MD  Sedation: Moderate (conscious) sedation was used.  Four mg Versed, 100 mcg Fentanyl were administered intravenously.  The patient's vital signs were monitored continuously by radiology nursing  throughout the procedure.  Sedation Time: 60 minutes  Fluoroscopy time: 15 minutes 6 seconds  Contrast volume: 10 ml Omnipaque-300  Intravenous medications:  A total of 12 mg TPA was administered into the thrombus  PROCEDURE/FINDINGS:   Informed consent was obtained from the patient following explanation of the procedure, risks, benefits and alternatives. The patient understands, agrees and consents for the procedure. All questions were addressed. A time out was performed.  Maximal barrier sterile technique utilized including caps, mask, sterile gowns, sterile gloves, large sterile drape, hand hygiene, and betadine skin prep.  The left popliteal fossa was interrogated with ultrasound.  The popliteal vein is expanded and completely thrombosed.  Local anesthesia was attained by infiltration with 1% lidocaine.  Under real time sonographic guidance, the thrombosed vein was punctured with a 21-gauge micropuncture needle.  Images obtained stored for the medical record.  The transitional micro sheath  was upsized to a 6-French working vascular sheath over a short Amplatz wire.  Using a stiff Glidewire and angled catheter, the catheter was used to navigate into the infrarenal IVC just beyond the nose cone of the IVC filter.  An inferior vena cava gram was performed limiting the amount of intravenous contrast used.  The left renal vein and inferior vena cava are patent.  Inflow from the right renal vein is also noted indicating patency.  The wire was advanced into the suprarenal IVC.  The 6-French 90 cm length AngioJet device was then used to power pulse spray a total of 6 mg of TPA and 125 ml of saline from the popliteal vein throughout the left lower extremity and into the IVC filter.  A 90 cm total length, 50 cm infusion length Unifuse infusion catheter was then positioned over the wire so that the proximal side hole was just within the filter, and the more proximal side hole in the mid thigh.  The sheath and catheter were secured to the skin with post silk suture.  The right popliteal fossa was interrogated with ultrasound.  Again, the popliteal vein was found be expanded completely thrombosed. Local anesthesia was attained by infiltration with 1% lidocaine. Under real time sonographic guidance, the vein was punctured using a 21-gauge micropuncture needle.  Images obtained stored for the medical record.  A 6-French working vascular sheath was then placed over a stiff Amplatz wire after transitioning from the transitional micro sheath.  The wire was advanced into the suprarenal IVC.  Care was taken to navigate the wire through the right aspect of the IVC and the filter to maximize lytic dispersal.  The 6-French AngioJet was then used to power pulse spray a total of 6 mg of TPA and 125 ml of saline from the popliteal artery into the IVC.  A 130 cm total length 50 cm infusion length Unifuse multi side-hole infusion catheter was then placed in identical position to the contralateral side.  Initial venous lysis was  initiated at 0.25 mg connector place through each catheter for a total of 0.5 mg per hour.  Overall, the patient tolerated the procedure very well.  There was no immediate complication.  IMPRESSION:  1.  Inferior venacavagram demonstrates patency of the renal veins and IVC above the level of the filter.  2. Initial limited bilateral lower extremity pharmacomechanical thrombolysis.  3.  Initiation of bilateral lower extremity venous lysis using bilateral multi side-hole infusion catheters.  The patient will lyse for 12-24 hours and return interventional radiology for venogram and potential adjunctive interventions tomorrow.  Signed,  Sterling Big, MD [facility]   Original Report Authenticated By: Malachy Moan, M.D.   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 Status post initiation of catheter directed venous thrombolytic therapy via bilateral popliteal access yesterday.  EXAM: 1. Followup angiography of bilateral lower extremities and IVC on completion of thrombolytic therapy.  2.  Venous angioplasty of right external and common iliac veins  FLUOROSCOPY TIME:  FLUOROSCOPY TIME  3 min and 12 seconds.  PROCEDURE: In a prone position, both pre-existing popliteal venous sheaths and infusion catheters were prepped with Betadine. Venography was performed of both lower extremities with injection of bilateral popliteal sheathes as well as both indwelling infusion catheters to assess venous patency from the level of the popliteal veins bilaterally through the iliac veins and inferior vena cava, including at the level of the IVC filter.  Right common iliac and external iliac veins were dilated with a 10 mm x 4 cm Conquest balloon. Additional venography was performed at the level of the common femoral veins bilaterally via 5 French catheters. Upon completion of the procedure, IV  heparin was turned off and manual compression was held at both popliteal venous access sites after sheath removal. Thrombolytic therapy was discontinued.  Complications: None  FINDINGS: Venography demonstrates dramatically improved patency of bilateral popliteal and femoral veins in both lower extremities with antegrade flow reestablished and no significant residual thrombus present. A minimal amount of adherent mural thrombus was present in the right femoral vein. Left-sided iliac veins showed good patency and flow with no significant narrowing identified.  Right-sided iliac veins showed less rapid antegrade flow with some mural thrombus and stenosis present at the level of the common and external iliac veins. Iliac venous patency improved significantly after 10 mm balloon angioplasty with excellent antegrade flow present.  Visualized segment of the inferior vena cava shows no significant residual thrombus with flow present, including at the level of the indwelling IVC filter. There may be a minimal amount of residual mural thrombus remaining in the lower IVC which does not appear to be impeding flow.  IMPRESSION: Dramatic improvement in patency of bilateral lower extremity deep veins, iliac veins and the IVC. There was some sluggish flow associated with an area of stenosis of the right external and common iliac veins. This segment was treated with 10 mm balloon angioplasty with improved result.  PREPERATION AND PROCEDURE MEDICATIONS: 4.0 mg IV Versed and and 200 mcg IV fentanyl.  Sedation time: 45 min   Electronically Signed   By: Irish Lack   On: 05/12/2013 15:57    CBC  Recent Labs Lab 05/21/13 0615 05/22/13 0555 05/23/13 0555 05/24/13 0540 05/24/13 1659 05/25/13 0550  WBC 7.6 6.1 6.0 5.2  --  6.5  HGB 8.4* 8.5* 8.3* 7.4* 8.0* 7.3*  HCT 26.1* 25.7* 25.5* 22.9* 24.7* 22.1*  PLT 244 247 274 272  --  263  MCV 88.5 88.0 88.2 87.4  --  87.7  MCH 28.5 29.1 28.7 28.2  --  29.0  MCHC 32.2 33.1  32.5 32.3  --  33.0  RDW 16.2* 16.0* 16.1* 16.2*  --  16.0*    Chemistries   Recent Labs Lab 05/19/13 0230 05/20/13 0220 05/21/13 0615 05/24/13 0540 05/25/13 0550  NA 129* 130* 131* 136 134*  K 4.5 4.5 4.4 4.4 4.4  CL 96 101 102 105 104  CO2 22 22 23  26 24  GLUCOSE 121* 112* 103* 113* 129*  BUN 37* 26* 17 15 17   CREATININE 1.48* 1.31 1.13 1.10 1.12  CALCIUM 6.9* 6.8* 7.4* 7.5* 8.0*   ------------------------------------------------------------------------------------------------------------------ estimated creatinine clearance is 84.3 ml/min (by C-G formula based on Cr of 1.12). ------------------------------------------------------------------------------------------------------------------ No results found for this basename: HGBA1C,  in the last 72 hours ------------------------------------------------------------------------------------------------------------------ No results found for this basename: CHOL, HDL, LDLCALC, TRIG, CHOLHDL, LDLDIRECT,  in the last 72 hours ------------------------------------------------------------------------------------------------------------------ No results found for this basename: TSH, T4TOTAL, FREET3, T3FREE, THYROIDAB,  in the last 72 hours ------------------------------------------------------------------------------------------------------------------  Recent Labs  05/23/13 1228  VITAMINB12 396  FOLATE 6.3  FERRITIN 1155*  TIBC 145*  IRON 40*  RETICCTPCT 8.0*    Coagulation profile No results found for this basename: INR, PROTIME,  in the last 168 hours  No results found for this basename: DDIMER,  in the last 72 hours  Cardiac Enzymes No results found for this basename: CK, CKMB, TROPONINI, MYOGLOBIN,  in the last 168 hours ------------------------------------------------------------------------------------------------------------------ No components found with this basename: POCBNP,

## 2013-05-26 LAB — GLUCOSE, CAPILLARY: Glucose-Capillary: 105 mg/dL — ABNORMAL HIGH (ref 70–99)

## 2013-05-26 LAB — CBC
HCT: 23.6 % — ABNORMAL LOW (ref 39.0–52.0)
Hemoglobin: 7.4 g/dL — ABNORMAL LOW (ref 13.0–17.0)
MCH: 28.6 pg (ref 26.0–34.0)
MCHC: 31.4 g/dL (ref 30.0–36.0)
MCV: 91.1 fL (ref 78.0–100.0)
Platelets: 277 10*3/uL (ref 150–400)
RBC: 2.59 MIL/uL — ABNORMAL LOW (ref 4.22–5.81)
RDW: 16.8 % — ABNORMAL HIGH (ref 11.5–15.5)
WBC: 5.3 10*3/uL (ref 4.0–10.5)

## 2013-05-26 NOTE — Progress Notes (Signed)
[facility]                                                                                Patient Demographics  Jack [name], is a 67 y.o. male, DOB - Nov 26, 1945, ZOX:[id number]  Admit date - 04/30/2013   Admitting Physician Hillary Bow, DO  Outpatient Primary MD for the patient is [facility], MD  LOS - 26   No chief complaint on file.       Assessment & Plan  S/p transurethral resection of prostate with left ureteroscopic stone manipulation 04/13/13 complicated by perinephric hematoma with bilateral hydronephrosis  underwent urgent cystoscopy 04/18/2013 with bilateral stent placement and bladder evacuation with suprapubic tube placement  - d/w Urology 10/10 - cap suprapubic cath and follow post void residuals- if he is doing well, can ask them next week to remove the cath.  -CT showed decreasing size of hematoma -Urology consulted and planning on replacing Suprapubic catheter this week. -Collection bag no longer has blood, clear  Bilateral lower extremity DVT / PE  -status post IVC filter 9/5 - given recent severe complications related to bleeding anticoagulation was not felt to be wise at the time of admission - further review of records revealed Dopplers from 9/7 which were negative for DVT - repeat dopplers this hospitalization confirmed extensive bilateral lower extremity DVTs - d/c'ed testosterone replacement  - due to fear of propagation of clot through IVC filter, heparin was subsequently re-intitiated on 9/22 for anticoag w/ closer monitoring -  - clot extended from lower extremities through IVC and involved filter- he underwent thrombolysis by IR- then developed compartment syndrome and was taken to OR on 10/1right calf fasciotomy  -back to OR on 10/6 to close faciotomy right leg to close - left leg had fasciotomy on 10/4 with closure 10/8 - on heparin  -No longer on Heparin, foot pumps in place -Will contact vascular surgery for recommendations regarding  anticoagulation  B LE compartment syndrome due to bleeding/hematoma  -S/p B/L fasciotomies - Vasc Surgery has been following -  - Oxycodone Q6 routine with PRN IV Morphine to control pain   C diff colitis  -started flagyl 10/3 for 2 wk course - diarrhea resolved  Acute renal failure/ CKD 3  -Resolved.  Cr 1.12.  (was likely due to prerenal vs ATN from Hypotension) -Nephrology signed off.   -ultrasound and Ct abd/pelvis revealed no acute findings  Progressive anemia / Iron deficiency  -Multifactorial causes: significant recent acute loss plus decreased production  -Will continue Iron supplementation -Hb 7.4 today.  Patient has received multiple transfusions during hospital course -Vit B 12- low normal, will continue to supplement   Hemorrhoidal GI bleed  -bright red blood streaking in stools 9/25 - appears to be from hemorrhoids - no further GI bleeding since 10/3- mucous stools   Hypotension -  BP has now stabilized   Hyperkalemia  resolved   Principal Problem:   Acute renal failure Active Problems:   Chronic renal insufficiency   Sepsis   Hyperkalemia   DVT, bilateral lower limbs   GI bleed   Pulmonary embolism 9/2   Code Status: Full  Family Communication: None at this time.  Disposition Plan: Admitted.  Looking for SNF placement.   Procedures  8/29 - TURP w/ L ureteral stent [facility]  9/02 - admit [facility] w/ V/Q "high probability" for PE >> anticoag >> severe hematuria + anemia + renal failure  9/3 - CT abdom reveals large L renal capsular hematoma  9/5 - transferred to [facility] to PCCM service - noted to have large clot in bladder w/ B hydronephrosis  9/5 - IVC filter placement  9/6 - B ureteral stents placed - open evacuation of clot from bladder- suprapubic tube placed  9/7- venous duplex lower extremity - NO DVT  9/21-venous duplex lower extremity- Bilateral: Positive for DVT in the common femoral, profunda, femoral, popliteal, and  posterior tibial veins with superficial thrombus in the greater and lesser saphenous veins.  9/22 - IV heparin initiated for DVTs (anticoagulation had been on hold due to renal hematoma)  9/28: Decision was made to perform a catheter directed lysis of clot and patient was transferred to the ICU for monitoring and further care while on lytics.  9/28-Bilateral popliteal venous acc with initiation of bilateral LE and IVC thrombolysis, popliteal sheaths  9/29- IR inferior venacavagram, thrombolysis/thrombectomy bilateral lower extremities showing improved patency, IVC patent. Advised d/c lytic therapy, continue heparin per pharmacy  9/30 - CT abd/pelvis showed stable L renal hematoma  10/1- Acute hematoma rt leg below knee, compartment syndrome  10/1- calf fasciotomy performed  10/4 LEFT calf fasciotomy performed  10/6 taken back to OR to close RIGHT fasciotomy  10/8 to OR to close LEFT fasciotomy  Consults   Nephrology  Vascular Surgery  IR  Urology   DVT Prophylaxis  Foot pumps  Lab Results  Component Value Date   PLT 277 05/26/2013    Medications  Scheduled Meds: . hydrocortisone   Rectal BID  . metroNIDAZOLE  500 mg Oral Q8H  . oxyCODONE  5 mg Oral Q6H  . sodium chloride  3 mL Intravenous Q12H   Continuous Infusions: . sodium chloride 10 mL/hr at 05/22/13 1900  . sodium chloride 100 mL/hr at 05/26/13 0316   PRN Meds:.ALPRAZolam, morphine injection, ondansetron (ZOFRAN) IV  Antibiotics    Anti-infectives   Start     Dose/Rate Route Frequency Ordered Stop   05/21/13 0915  ceFAZolin (ANCEF) IVPB 2 g/50 mL premix     2 g 100 mL/hr over 30 Minutes Intravenous  Once 05/21/13 0900 05/21/13 0855   05/21/13 0823  ceFAZolin (ANCEF) 2-3 GM-% IVPB SOLR    Comments:  Hypes, Karen   : cabinet override      05/21/13 0823 05/21/13 2029   05/17/13 1024  ceFAZolin (ANCEF) 1-5 GM-% IVPB    Comments:  BUTLER, DAVE: cabinet override      05/17/13 1024 05/17/13 2229   05/17/13 1023   ceFAZolin (ANCEF) 2-3 GM-% IVPB SOLR    Comments:  BUTLER, DAVE: cabinet override      05/17/13 1023 05/17/13 1035   05/16/13 1400  metroNIDAZOLE (FLAGYL) tablet 500 mg     500 mg Oral 3 times per day 05/16/13 1128 05/30/13 1359   05/14/13 1145  ceFAZolin (ANCEF) IVPB 2 g/50 mL premix     2 g 100 mL/hr over 30 Minutes Intravenous To Surgery 05/14/13 1141 05/14/13 1145   05/03/13 1600  piperacillin-tazobactam (ZOSYN) IVPB 3.375 g     3.375 g 12.5 mL/hr over 240 Minutes Intravenous 3 times per day 05/03/13 1222 05/04/13 0213   05/03/13 1000  vancomycin (VANCOCIN) 1,500 mg in  sodium chloride 0.9 % 500 mL IVPB  Status:  Discontinued     1,500 mg 250 mL/hr over 120 Minutes Intravenous Every 48 hours 05/01/13 1105 05/01/13 1403   05/01/13 1000  vancomycin (VANCOCIN) 1,500 mg in sodium chloride 0.9 % 500 mL IVPB  Status:  Discontinued     1,500 mg 250 mL/hr over 120 Minutes Intravenous Every 24 hours 05/01/13 0105 05/01/13 1105   04/30/13 1600  piperacillin-tazobactam (ZOSYN) IVPB 2.25 g  Status:  Discontinued     2.25 g 100 mL/hr over 30 Minutes Intravenous Every 8 hours 04/30/13 1342 05/03/13 1222   04/30/13 1200  vancomycin (VANCOCIN) IVPB 1000 mg/200 mL premix  Status:  Discontinued     1,000 mg 200 mL/hr over 60 Minutes Intravenous Every 12 hours 04/30/13 0245 04/30/13 1326   04/30/13 0800  piperacillin-tazobactam (ZOSYN) IVPB 3.375 g  Status:  Discontinued     3.375 g 12.5 mL/hr over 240 Minutes Intravenous Every 8 hours 04/30/13 0245 04/30/13 1326   04/30/13 0300  vancomycin (VANCOCIN) 2,000 mg in sodium chloride 0.9 % 500 mL IVPB     2,000 mg 250 mL/hr over 120 Minutes Intravenous  Once 04/30/13 0245 04/30/13 0510   04/30/13 0300  piperacillin-tazobactam (ZOSYN) IVPB 3.375 g     3.375 g 100 mL/hr over 30 Minutes Intravenous  Once 04/30/13 0245 04/30/13 0340       Time Spent in minutes   30 minutes   Emylee Decelle D.O. on 05/26/2013 at 8:43 AM  Between 7am to 7pm - Pager -  [phone]  After 7pm go to [email or web address] - password [email or web address]  And look for the night coverage person covering for me after hours  [facility] Office  [phone]    Subjective:   Jourden Gilson seen and examined today.  Patient has no complaints today.  Wishes he could increase his strength.  Patient denies any other episodes of diarrhea. Patient denies dizziness, chest pain, shortness of breath, abdominal pain, N/V/D/C, new weakness, numbess, tingling.    Objective:   Filed Vitals:   05/25/13 1420 05/25/13 2306 05/26/13 0500 05/26/13 0646  BP: 106/54 124/56  134/66  Pulse: 96 86  84  Temp: 98.2 F (36.8 C) 98.4 F (36.9 C)  98.4 F (36.9 C)  TempSrc: Oral Oral  Oral  Resp: 18 18  18   Height:  5\' 9"  (1.753 m)    Weight:  127.098 kg (280 lb 3.2 oz) 127.098 kg (280 lb 3.2 oz)   SpO2: 96% 99%  98%    Wt Readings from Last 3 Encounters:  05/26/13 127.098 kg (280 lb 3.2 oz)  05/26/13 127.098 kg (280 lb 3.2 oz)  05/26/13 127.098 kg (280 lb 3.2 oz)     Intake/Output Summary (Last 24 hours) at 05/26/13 0843 Last data filed at 05/26/13 0825  Gross per 24 hour  Intake   3348 ml  Output   3800 ml  Net   -452 ml    Exam  General: Well developed, well nourished, NAD, appears stated age  HEENT: NCAT, PERRLA, EOMI, Anicteic Sclera, mucous membranes moist.   Neck: Supple, no JVD, no masses  Cardiovascular: S1 S2 auscultated, no rubs, murmurs or gallops.  Regular rate and rhythm.  Respiratory: Clear to auscultation bilaterally with equal chest rise  Abdomen: Soft, obese, nondistended, nontender, + bowel sounds, suprapubic catheter in place  Extremities: Lower extremities both are wrapped. Otherwise warm dry without cyanosis clubbing.  Edema note in lower extremities bilaterally.  Neuro: AAOx3,  cranial nerves grossly intact.   Skin: Without rashes exudates or nodules  Psych: Normal affect and demeanor with intact judgement and insight, slightly  depressed    Data Review   Micro Results Recent Results (from the past 240 hour(s))  MRSA PCR SCREENING     Status: None   Collection Time    05/16/13  3:26 PM      Result Value Range Status   MRSA by PCR NEGATIVE  NEGATIVE Final   Comment:            The GeneXpert MRSA Assay (FDA     approved for NASAL specimens     only), is one component of a     comprehensive MRSA colonization     surveillance program. It is not     intended to diagnose MRSA     infection nor to guide or     monitor treatment for     MRSA infections.    Radiology Reports Ct Abdomen Pelvis W Wo Contrast  05/24/2013   CLINICAL DATA:  67 year old male with hematuria. Patient with bilateral urinary stents and subcapsular hematoma. Marland Kitchen  EXAM: CT ABDOMEN AND PELVIS WITHOUT AND WITH CONTRAST  TECHNIQUE: Multidetector CT imaging of the abdomen and pelvis was performed without contrast material in one or both body regions, followed by contrast material(s) and further sections in one or both body regions. The patient was pre-medicated by the clinician due to contrast allergy history.  CONTRAST:  OMNIPAQUE IOHEXOL 300 MG/ML  SOLN  COMPARISON:  05/13/2013 and prior CTs  FINDINGS: Tiny bilateral pleural effusions are noted.  The left subcapsular renal hematoma has decreased in size, now measuring 10 mm in thickness, previously 16 mm.  Bilateral renal atrophy, moderate to severe renal cortical thinning, left renal cyst, nonobstructing left renal calculi and bilateral urinary stents are unchanged. The urinary stents are unchanged in appearance with tips in the bladder and renal collecting systems.  Mild right hydronephrosis is slightly increased since the prior study. On the delayed images, small filling defects within the right renal calices are noted and may represent small clots, sloughed papilla (papillary necrosis, but not identified on the left side), atypical infection or less likely non opaque calculi. Other etiologies  are less likely.  Suprapubic catheter and thick walled bladder are unchanged.  An infrarenal IVC filter is again identified.  The liver, spleen, pancreas, gallbladder and adrenal glands are within normal limits.  No free fluid, enlarged lymph nodes, biliary dilatation or abdominal aortic aneurysm noted.  The bowel, bladder and appendix are unremarkable. There is no evidence of bowel obstruction, abscess or pneumoperitoneum.  No acute or suspicious bony abnormalities are identified.  IMPRESSION: Decreased left subcapsular renal hematoma, now measuring 10 mm, previously 16 mm.  Slightly increasing mild right hydronephrosis. Small filling defects within the right renal calices now noted and differential includes small clots, atypical/fungal infection or less likely non opaque calculi. Uroepithelial neoplasm is considered less likely.  Unchanged bilateral renal atrophy, nonobstructing left renal calculi, bilateral urinary stents and thick-walled bladder.   Electronically Signed   By: Laveda Abbe M.D.   On: 05/24/2013 16:43   Ct Abdomen Pelvis Wo Contrast  05/13/2013   *RADIOLOGY REPORT*  Clinical Data: reassess renal hematomas  CT ABDOMEN AND PELVIS WITHOUT CONTRAST  Technique:  Multidetector CT imaging of the abdomen and pelvis was performed following the standard protocol without intravenous contrast.  Comparison: 05/10/13  Findings: Slight increase in small bilateral pleural effusions.  Liver,  gallbladder, spleen, pancreas, and adrenal glands normal and unchanged, except for incidental punctate calcification right adrenal gland.  Bilateral ureteral stents unchanged.  16 mm thick  subcapsular hematoma left kidney stable.  Large left upper pole cyst stable. Small right exophytic mid pole cyst stable.  Suprapubic bladder catheter unchanged.  IVC filter stable.  Bowel remains unremarkable.  IMPRESSION: Stable subcapsular left renal hematoma.Small bilateral pleural effusions, mildly increased.   Original Report  Authenticated By: Esperanza Heir, M.D.   Ct Abdomen Pelvis Wo Contrast  04/30/2013   *RADIOLOGY REPORT*  Clinical Data:   Acute renal failure.  CT ABDOMEN AND PELVIS WITHOUT CONTRAST  Technique:  Multidetector CT imaging of the abdomen and pelvis was performed following the standard protocol without intravenous contrast.  Comparison: CT scan from 04/16/2013  Findings: Focal atelectasis or pneumonia is seen in the posterior left base.  No focal abnormalities seen in the liver or spleen on this study performed without intravenous contrast material.  The stomach is decompressed.  The duodenum, pancreas, gallbladder, and adrenal glands are unremarkable.  Stable appearance of the 7.8 cm cyst in the upper pole of the left kidney.  6 mm nonobstructing stone is seen in the lower pole of the left kidney.  The left sub capsular hematoma has decreased in the interval measuring about 1.7 cm in thickness today compared 2.4 cm previously.  The left double-J internal ureteral stent remains in place and is stable in position. There is been interval placement of a right double-J internal ureteral stent with the proximal loop formed and upper pole calix and the lower loop formed in the urinary bladder.  IVC filter again noted.  The no abdominal aortic aneurysm.  No free fluid or lymphadenopathy in the abdomen.  Imaging through the pelvis shows no apparent midline lower abdominal wound suggesting recent surgery.  Edema or inflammation is seen in the soft tissues of the extraperitoneal pelvic floor. Bilateral inguinal hernias contain only fat.  There is subcutaneous edema in the lower abdomen and pelvis bilaterally.  No colonic diverticulitis.  No evidence for bowel obstruction.  The patient is status post right hemicolectomy.  Urinary bladder is decompressed by a suprapubic tube.  Bone windows reveal no worrisome lytic or sclerotic osseous lesions.  IMPRESSION: Interval decrease in size of the left renal subcapsular fluid  collection, suggesting resolving hematoma.  The patient has bilateral internal ureteral stents without overt hydronephrosis at this time.  There is some fullness of the right intrarenal collecting system which is stable to mildly increased in the interval.  Fullness of the left intrarenal collecting system seen previously has decreased in the interval.   Original Report Authenticated By: Kennith Center, M.D.   Dg Chest 2 View  04/27/2013   *RADIOLOGY REPORT*  Clinical Data: Chest pain, extremity numbness  CHEST - 2 VIEW  Comparison: Prior chest x-ray 04/21/2013  Findings: Stable low inspiratory volumes with perhaps trace subsegmental atelectasis.  The lungs are otherwise clear.  Cardiac and mediastinal contours are upper limits of normal but unchanged compared to prior.  No acute osseous abnormality.  Remote healed right-sided rib fractures.  No pleural effusion or pneumothorax.  IMPRESSION: Low inspiratory volumes, but otherwise no acute cardiopulmonary process.   Original Report Authenticated By: Malachy Moan, M.D.   Ir Veno/ext/bi  05/11/2013   *RADIOLOGY REPORT*  PRIOR ULTRASOUND GUIDED VASCULAR ACCESS; IR INFERIOR VENA CAVA GRAM; IR THROMBOLYSIS/THROMBECTOMY BILATERAL LOWER EXTREMITIES; IR INITIATION OF VENOUS LYSIS INITIAL DAY  Date: 05/11/2013  Clinical History: 67 year old male  with complicated medical history.  He had a TURP and left double-J ureteral stent placement procedure done in the end of August which was complicated by postoperative PE and subsequent postoperative hemorrhage following initiation of anticoagulation.  Hemostasis was obtained and IVC filter was placed.  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  He is now nearly 4 weeks post operative and showing in significant improvement with systemic heparinization.  He presents for attempted  pharmacomechanical catheter directed thrombolysis/thrombectomy.  Procedures Performed: 1. Ultrasound-guided puncture of the left popliteal vein 2.  Catheterization of the inferior vena cava above the IVC filter with vena cava gram 3.  Power pulse spray infusion of 6 mg of TPA from the popliteal access to the infrarenal IVC 4.  Placement of a 90 cm total length 50 cm infusion length multi side-hole infusion catheter 5.  Ultrasound-guided puncture of the right popliteal vein 6.  Catheterization of the inferior vena cava above the IVC filter 7.  Power pulse spray infusion of 6 mg of TPA from the right popliteal access into the infrarenal IVC 8.  Placement of a 130 cm total length 50 cm infusion length multi side-hole infusion catheter 9.  Initiation of venous thrombolysis  Interventional Radiologist:  Sterling Big, MD  Sedation: Moderate (conscious) sedation was used.  Four mg Versed, 100 mcg Fentanyl were administered intravenously.  The patient's vital signs were monitored continuously by radiology nursing throughout the procedure.  Sedation Time: 60 minutes  Fluoroscopy time: 15 minutes 6 seconds  Contrast volume: 10 ml Omnipaque-300  Intravenous medications:  A total of 12 mg TPA was administered into the thrombus  PROCEDURE/FINDINGS:   Informed consent was obtained from the patient following explanation of the procedure, risks, benefits and alternatives. The patient understands, agrees and consents for the procedure. All questions were addressed. A time out was performed.  Maximal barrier sterile technique utilized including caps, mask, sterile gowns, sterile gloves, large sterile drape, hand hygiene, and betadine skin prep.  The left popliteal fossa was interrogated with ultrasound.  The popliteal vein is expanded and completely thrombosed.  Local anesthesia was attained by infiltration with 1% lidocaine.  Under real time sonographic guidance, the thrombosed vein was punctured with a 21-gauge micropuncture  needle.  Images obtained stored for the medical record.  The transitional micro sheath was upsized to a 6-French working vascular sheath over a short Amplatz wire.  Using a stiff Glidewire and angled catheter, the catheter was used to navigate into the infrarenal IVC just beyond the nose cone of the IVC filter.  An inferior vena cava gram was performed limiting the amount of intravenous contrast used.  The left renal vein and inferior vena cava are patent.  Inflow from the right renal vein is also noted indicating patency.  The wire was advanced into the suprarenal IVC.  The 6-French 90 cm length AngioJet device was then used to power pulse spray a total of 6 mg of TPA and 125 ml of saline from the popliteal vein throughout the left lower extremity and into the IVC filter.  A 90 cm total length, 50 cm infusion length Unifuse infusion catheter was then positioned over the wire so that the proximal side hole was just within the filter, and the more proximal side hole in the mid thigh.  The sheath and catheter were secured to the skin with post silk suture.  The right  popliteal fossa was interrogated with ultrasound.  Again, the popliteal vein was found be expanded completely thrombosed. Local anesthesia was attained by infiltration with 1% lidocaine. Under real time sonographic guidance, the vein was punctured using a 21-gauge micropuncture needle.  Images obtained stored for the medical record.  A 6-French working vascular sheath was then placed over a stiff Amplatz wire after transitioning from the transitional micro sheath.  The wire was advanced into the suprarenal IVC.  Care was taken to navigate the wire through the right aspect of the IVC and the filter to maximize lytic dispersal.  The 6-French AngioJet was then used to power pulse spray a total of 6 mg of TPA and 125 ml of saline from the popliteal artery into the IVC.  A 130 cm total length 50 cm infusion length Unifuse multi side-hole infusion catheter was  then placed in identical position to the contralateral side.  Initial venous lysis was initiated at 0.25 mg connector place through each catheter for a total of 0.5 mg per hour.  Overall, the patient tolerated the procedure very well.  There was no immediate complication.  IMPRESSION:  1.  Inferior venacavagram demonstrates patency of the renal veins and IVC above the level of the filter.  2. Initial limited bilateral lower extremity pharmacomechanical thrombolysis.  3.  Initiation of bilateral lower extremity venous lysis using bilateral multi side-hole infusion catheters.  The patient will lyse for 12-24 hours and return interventional radiology for venogram and potential adjunctive interventions tomorrow.  Signed,  Sterling Big, MD [facility]   Original Report Authenticated By: Malachy Moan, M.D.   Ir Caffie Damme Ivc  05/11/2013   *RADIOLOGY REPORT*  PRIOR ULTRASOUND GUIDED VASCULAR ACCESS; IR INFERIOR VENA CAVA GRAM; IR THROMBOLYSIS/THROMBECTOMY BILATERAL LOWER EXTREMITIES; IR INITIATION OF VENOUS LYSIS INITIAL DAY  Date: 05/11/2013  Clinical History: 67 year old male with complicated medical history.  He had a TURP and left double-J ureteral stent placement procedure done in the end of August which was complicated by postoperative PE and subsequent postoperative hemorrhage following initiation of anticoagulation.  Hemostasis was obtained and IVC filter was placed.  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  He is now nearly 4 weeks post operative and showing in significant improvement with systemic heparinization.  He presents for attempted pharmacomechanical catheter directed thrombolysis/thrombectomy.  Procedures Performed: 1. Ultrasound-guided puncture of the left popliteal vein 2.  Catheterization of  the inferior vena cava above the IVC filter with vena cava gram 3.  Power pulse spray infusion of 6 mg of TPA from the popliteal access to the infrarenal IVC 4.  Placement of a 90 cm total length 50 cm infusion length multi side-hole infusion catheter 5.  Ultrasound-guided puncture of the right popliteal vein 6.  Catheterization of the inferior vena cava above the IVC filter 7.  Power pulse spray infusion of 6 mg of TPA from the right popliteal access into the infrarenal IVC 8.  Placement of a 130 cm total length 50 cm infusion length multi side-hole infusion catheter 9.  Initiation of venous thrombolysis  Interventional Radiologist:  Sterling Big, MD  Sedation: Moderate (conscious) sedation was used.  Four mg Versed, 100 mcg Fentanyl were administered intravenously.  The patient's vital signs were monitored continuously by radiology nursing throughout the procedure.  Sedation Time: 60 minutes  Fluoroscopy time: 15 minutes 6 seconds  Contrast volume: 10 ml Omnipaque-300  Intravenous medications:  A total of 12 mg TPA was administered into the thrombus  PROCEDURE/FINDINGS:   Informed consent was obtained from the patient following explanation of the procedure, risks, benefits and alternatives. The patient understands, agrees and consents for the procedure. All questions were addressed. A time out was performed.  Maximal barrier sterile technique utilized including caps, mask, sterile gowns, sterile gloves, large sterile drape, hand hygiene, and betadine skin prep.  The left popliteal fossa was interrogated with ultrasound.  The popliteal vein is expanded and completely thrombosed.  Local anesthesia was attained by infiltration with 1% lidocaine.  Under real time sonographic guidance, the thrombosed vein was punctured with a 21-gauge micropuncture needle.  Images obtained stored for the medical record.  The transitional micro sheath was upsized to a 6-French working vascular sheath over a short Amplatz wire.   Using a stiff Glidewire and angled catheter, the catheter was used to navigate into the infrarenal IVC just beyond the nose cone of the IVC filter.  An inferior vena cava gram was performed limiting the amount of intravenous contrast used.  The left renal vein and inferior vena cava are patent.  Inflow from the right renal vein is also noted indicating patency.  The wire was advanced into the suprarenal IVC.  The 6-French 90 cm length AngioJet device was then used to power pulse spray a total of 6 mg of TPA and 125 ml of saline from the popliteal vein throughout the left lower extremity and into the IVC filter.  A 90 cm total length, 50 cm infusion length Unifuse infusion catheter was then positioned over the wire so that the proximal side hole was just within the filter, and the more proximal side hole in the mid thigh.  The sheath and catheter were secured to the skin with post silk suture.  The right popliteal fossa was interrogated with ultrasound.  Again, the popliteal vein was found be expanded completely thrombosed. Local anesthesia was attained by infiltration with 1% lidocaine. Under real time sonographic guidance, the vein was punctured using a 21-gauge micropuncture needle.  Images obtained stored for the medical record.  A 6-French working vascular sheath was then placed over a stiff Amplatz wire after transitioning from the transitional micro sheath.  The wire was advanced into the suprarenal IVC.  Care was taken to navigate the wire through the right aspect of the IVC and the filter to maximize lytic dispersal.  The 6-French AngioJet was then used to power pulse spray a total of 6 mg of TPA and 125 ml of saline from the popliteal artery into the IVC.  A 130 cm total length 50 cm infusion length Unifuse multi side-hole infusion catheter was then placed in identical position to the contralateral side.  Initial venous lysis was initiated at 0.25 mg connector place through each catheter for a total of 0.5  mg per hour.  Overall, the patient tolerated the procedure very well.  There was no immediate complication.  IMPRESSION:  1.  Inferior venacavagram demonstrates patency of the renal veins and IVC above the level of the filter.  2. Initial limited bilateral lower extremity pharmacomechanical thrombolysis.  3.  Initiation of bilateral lower extremity venous lysis using bilateral multi side-hole infusion catheters.  The patient will lyse for 12-24 hours and return interventional radiology for venogram and potential adjunctive interventions tomorrow.  Signed,  Sterling Big, MD [facility] Radiology   Original Report Authenticated  By: Malachy Moan, M.D.   US Renal  04/30/2013   CLINICAL DATA:  Acute renal failure.  EXAM: RENAL/URINARY TRACT ULTRASOUND COMPLETE  COMPARISON:  04/18/2013  FINDINGS: Right Kidney  Length: 10.7 cm. Mild right hydronephrosis. Stent is visualized within the dilated renal pelvis. 1.4 cm cyst.  Left Kidney  Length: 14.0 cm. Slight pelvicaliectasis. Stent cannot be visualized.  7.1 cm cystic mass in the upper pole of the left kidney. This likely represents a benign cyst and is stable since prior study.  Bladder:  Cannot visualize.  IMPRESSION: Mild right hydronephrosis and slight pelvicaliectasis on the left. These findings have improved since prior study.  Bilateral renal cysts.   Electronically Signed   By: Charlett Nose M.D.   On: 04/30/2013 16:58   Ir Thrombect Veno Mech Mod Sed  05/11/2013   *RADIOLOGY REPORT*  PRIOR ULTRASOUND GUIDED VASCULAR ACCESS; IR INFERIOR VENA CAVA GRAM; IR THROMBOLYSIS/THROMBECTOMY BILATERAL LOWER EXTREMITIES; IR INITIATION OF VENOUS LYSIS INITIAL DAY  Date: 05/11/2013  Clinical History: 67 year old male with complicated medical history.  He had a TURP and left double-J ureteral stent placement procedure done in the end of August which was complicated by postoperative PE and subsequent postoperative  hemorrhage following initiation of anticoagulation.  Hemostasis was obtained and IVC filter was placed.  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  He is now nearly 4 weeks post operative and showing in significant improvement with systemic heparinization.  He presents for attempted pharmacomechanical catheter directed thrombolysis/thrombectomy.  Procedures Performed: 1. Ultrasound-guided puncture of the left popliteal vein 2.  Catheterization of the inferior vena cava above the IVC filter with vena cava gram 3.  Power pulse spray infusion of 6 mg of TPA from the popliteal access to the infrarenal IVC 4.  Placement of a 90 cm total length 50 cm infusion length multi side-hole infusion catheter 5.  Ultrasound-guided puncture of the right popliteal vein 6.  Catheterization of the inferior vena cava above the IVC filter 7.  Power pulse spray infusion of 6 mg of TPA from the right popliteal access into the infrarenal IVC 8.  Placement of a 130 cm total length 50 cm infusion length multi side-hole infusion catheter 9.  Initiation of venous thrombolysis  Interventional Radiologist:  Sterling Big, MD  Sedation: Moderate (conscious) sedation was used.  Four mg Versed, 100 mcg Fentanyl were administered intravenously.  The patient's vital signs were monitored continuously by radiology nursing throughout the procedure.  Sedation Time: 60 minutes  Fluoroscopy time: 15 minutes 6 seconds  Contrast volume: 10 ml Omnipaque-300  Intravenous medications:  A total of 12 mg TPA was administered into the thrombus  PROCEDURE/FINDINGS:   Informed consent was obtained from the patient following explanation of the procedure, risks, benefits and alternatives. The patient understands, agrees and consents for the procedure. All questions were addressed. A time out was performed.  Maximal barrier  sterile technique utilized including caps, mask, sterile gowns, sterile gloves, large sterile drape, hand hygiene, and betadine skin prep.  The left popliteal fossa was interrogated with ultrasound.  The popliteal vein is expanded and completely thrombosed.  Local anesthesia was attained by infiltration with 1% lidocaine.  Under real time sonographic guidance, the thrombosed vein was punctured with a 21-gauge micropuncture needle.  Images obtained stored for the medical record.  The transitional micro sheath was upsized to a 6-French working vascular sheath over  a short Amplatz wire.  Using a stiff Glidewire and angled catheter, the catheter was used to navigate into the infrarenal IVC just beyond the nose cone of the IVC filter.  An inferior vena cava gram was performed limiting the amount of intravenous contrast used.  The left renal vein and inferior vena cava are patent.  Inflow from the right renal vein is also noted indicating patency.  The wire was advanced into the suprarenal IVC.  The 6-French 90 cm length AngioJet device was then used to power pulse spray a total of 6 mg of TPA and 125 ml of saline from the popliteal vein throughout the left lower extremity and into the IVC filter.  A 90 cm total length, 50 cm infusion length Unifuse infusion catheter was then positioned over the wire so that the proximal side hole was just within the filter, and the more proximal side hole in the mid thigh.  The sheath and catheter were secured to the skin with post silk suture.  The right popliteal fossa was interrogated with ultrasound.  Again, the popliteal vein was found be expanded completely thrombosed. Local anesthesia was attained by infiltration with 1% lidocaine. Under real time sonographic guidance, the vein was punctured using a 21-gauge micropuncture needle.  Images obtained stored for the medical record.  A 6-French working vascular sheath was then placed over a stiff Amplatz wire after transitioning from  the transitional micro sheath.  The wire was advanced into the suprarenal IVC.  Care was taken to navigate the wire through the right aspect of the IVC and the filter to maximize lytic dispersal.  The 6-French AngioJet was then used to power pulse spray a total of 6 mg of TPA and 125 ml of saline from the popliteal artery into the IVC.  A 130 cm total length 50 cm infusion length Unifuse multi side-hole infusion catheter was then placed in identical position to the contralateral side.  Initial venous lysis was initiated at 0.25 mg connector place through each catheter for a total of 0.5 mg per hour.  Overall, the patient tolerated the procedure very well.  There was no immediate complication.  IMPRESSION:  1.  Inferior venacavagram demonstrates patency of the renal veins and IVC above the level of the filter.  2. Initial limited bilateral lower extremity pharmacomechanical thrombolysis.  3.  Initiation of bilateral lower extremity venous lysis using bilateral multi side-hole infusion catheters.  The patient will lyse for 12-24 hours and return interventional radiology for venogram and potential adjunctive interventions tomorrow.  Signed,  Sterling Big, MD [facility] Radiology   Original Report Authenticated By: Malachy Moan, M.D.   Ir Thrombect Veno Mech Mod Sed  05/11/2013   *RADIOLOGY REPORT*  PRIOR ULTRASOUND GUIDED VASCULAR ACCESS; IR INFERIOR VENA CAVA GRAM; IR THROMBOLYSIS/THROMBECTOMY BILATERAL LOWER EXTREMITIES; IR INITIATION OF VENOUS LYSIS INITIAL DAY  Date: 05/11/2013  Clinical History: 67 year old male with complicated medical history.  He had a TURP and left double-J ureteral stent placement procedure done in the end of August which was complicated by postoperative PE and subsequent postoperative hemorrhage following initiation of anticoagulation.  Hemostasis was obtained and IVC filter was placed.  The patient subsequently developed  extensive bilateral lower extremity swelling and was found to have extensive bilateral lower extremity and caval DVT extending into the filter.  He is now nearly 4 weeks post operative and showing in significant improvement with systemic heparinization.  He presents for attempted pharmacomechanical catheter directed thrombolysis/thrombectomy.  Procedures Performed: 1. Ultrasound-guided puncture of the left popliteal vein 2.  Catheterization of the inferior vena cava above the IVC filter with vena cava gram 3.  Power pulse spray infusion of 6 mg of TPA from the popliteal access to the infrarenal IVC 4.  Placement of a 90 cm total length 50 cm infusion length multi side-hole infusion catheter 5.  Ultrasound-guided puncture of the right popliteal vein 6.  Catheterization of the inferior vena cava above the IVC filter 7.  Power pulse spray infusion of 6 mg of TPA from the right popliteal access into the infrarenal IVC 8.  Placement of a 130 cm total length 50 cm infusion length multi side-hole infusion catheter 9.  Initiation of venous thrombolysis  Interventional Radiologist:  Sterling Big, MD  Sedation: Moderate (conscious) sedation was used.  Four mg Versed, 100 mcg Fentanyl were administered intravenously.  The patient's vital signs were monitored continuously by radiology nursing throughout the procedure.  Sedation Time: 60 minutes  Fluoroscopy time: 15 minutes 6 seconds  Contrast volume: 10 ml Omnipaque-300  Intravenous medications:  A total of 12 mg TPA was administered into the thrombus  PROCEDURE/FINDINGS:   Informed consent was obtained from the patient following explanation of the procedure, risks, benefits and alternatives. The patient understands, agrees and consents for the procedure. All questions were addressed. A time out was performed.  Maximal barrier sterile technique utilized including caps, mask, sterile gowns, sterile gloves, large sterile drape, hand hygiene, and betadine skin prep.  The  left popliteal fossa was interrogated with ultrasound.  The popliteal vein is expanded and completely thrombosed.  Local anesthesia was attained by infiltration with 1% lidocaine.  Under real time sonographic guidance, the thrombosed vein was punctured with a 21-gauge micropuncture needle.  Images obtained stored for the medical record.  The transitional micro sheath was upsized to a 6-French working vascular sheath over a short Amplatz wire.  Using a stiff Glidewire and angled catheter, the catheter was used to navigate into the infrarenal IVC just beyond the nose cone of the IVC filter.  An inferior vena cava gram was performed limiting the amount of intravenous contrast used.  The left renal vein and inferior vena cava are patent.  Inflow from the right renal vein is also noted indicating patency.  The wire was advanced into the suprarenal IVC.  The 6-French 90 cm length AngioJet device was then used to power pulse spray a total of 6 mg of TPA and 125 ml of saline from the popliteal vein throughout the left lower extremity and into the IVC filter.  A 90 cm total length, 50 cm infusion length Unifuse infusion catheter was then positioned over the wire so that the proximal side hole was just within the filter, and the more proximal side hole in the mid thigh.  The sheath and catheter were secured to the skin with post silk suture.  The right popliteal fossa was interrogated with ultrasound.  Again, the popliteal vein was found be expanded completely thrombosed. Local anesthesia was attained by infiltration with 1% lidocaine. Under real time sonographic guidance, the vein was punctured using a 21-gauge micropuncture needle.  Images obtained stored for the medical record.  A 6-French working vascular sheath was then placed over a stiff Amplatz wire after transitioning from the transitional micro sheath.  The wire was advanced into the suprarenal IVC.  Care was taken to navigate the wire through the right aspect of  the IVC and the filter to maximize  lytic dispersal.  The 6-French AngioJet was then used to power pulse spray a total of 6 mg of TPA and 125 ml of saline from the popliteal artery into the IVC.  A 130 cm total length 50 cm infusion length Unifuse multi side-hole infusion catheter was then placed in identical position to the contralateral side.  Initial venous lysis was initiated at 0.25 mg connector place through each catheter for a total of 0.5 mg per hour.  Overall, the patient tolerated the procedure very well.  There was no immediate complication.  IMPRESSION:  1.  Inferior venacavagram demonstrates patency of the renal veins and IVC above the level of the filter.  2. Initial limited bilateral lower extremity pharmacomechanical thrombolysis.  3.  Initiation of bilateral lower extremity venous lysis using bilateral multi side-hole infusion catheters.  The patient will lyse for 12-24 hours and return interventional radiology for venogram and potential adjunctive interventions tomorrow.  Signed,  Sterling Big, MD [facility] Radiology   Original Report Authenticated By: Malachy Moan, M.D.   Ir US Guide Vasc Access Left  05/11/2013   *RADIOLOGY REPORT*  PRIOR ULTRASOUND GUIDED VASCULAR ACCESS; IR INFERIOR VENA CAVA GRAM; IR THROMBOLYSIS/THROMBECTOMY BILATERAL LOWER EXTREMITIES; IR INITIATION OF VENOUS LYSIS INITIAL DAY  Date: 05/11/2013  Clinical History: 67 year old male with complicated medical history.  He had a TURP and left double-J ureteral stent placement procedure done in the end of August which was complicated by postoperative PE and subsequent postoperative hemorrhage following initiation of anticoagulation.  Hemostasis was obtained and IVC filter was placed.  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  He is now  nearly 4 weeks post operative and showing in significant improvement with systemic heparinization.  He presents for attempted pharmacomechanical catheter directed thrombolysis/thrombectomy.  Procedures Performed: 1. Ultrasound-guided puncture of the left popliteal vein 2.  Catheterization of the inferior vena cava above the IVC filter with vena cava gram 3.  Power pulse spray infusion of 6 mg of TPA from the popliteal access to the infrarenal IVC 4.  Placement of a 90 cm total length 50 cm infusion length multi side-hole infusion catheter 5.  Ultrasound-guided puncture of the right popliteal vein 6.  Catheterization of the inferior vena cava above the IVC filter 7.  Power pulse spray infusion of 6 mg of TPA from the right popliteal access into the infrarenal IVC 8.  Placement of a 130 cm total length 50 cm infusion length multi side-hole infusion catheter 9.  Initiation of venous thrombolysis  Interventional Radiologist:  Sterling Big, MD  Sedation: Moderate (conscious) sedation was used.  Four mg Versed, 100 mcg Fentanyl were administered intravenously.  The patient's vital signs were monitored continuously by radiology nursing throughout the procedure.  Sedation Time: 60 minutes  Fluoroscopy time: 15 minutes 6 seconds  Contrast volume: 10 ml Omnipaque-300  Intravenous medications:  A total of 12 mg TPA was administered into the thrombus  PROCEDURE/FINDINGS:   Informed consent was obtained from the patient following explanation of the procedure, risks, benefits and alternatives. The patient understands, agrees and consents for the procedure. All questions were addressed. A time out was performed.  Maximal barrier sterile technique utilized including caps, mask, sterile gowns, sterile gloves, large sterile drape, hand hygiene, and betadine skin prep.  The left popliteal fossa was interrogated with ultrasound.  The popliteal vein is expanded and  completely thrombosed.  Local anesthesia was attained by  infiltration with 1% lidocaine.  Under real time sonographic guidance, the thrombosed vein was punctured with a 21-gauge micropuncture needle.  Images obtained stored for the medical record.  The transitional micro sheath was upsized to a 6-French working vascular sheath over a short Amplatz wire.  Using a stiff Glidewire and angled catheter, the catheter was used to navigate into the infrarenal IVC just beyond the nose cone of the IVC filter.  An inferior vena cava gram was performed limiting the amount of intravenous contrast used.  The left renal vein and inferior vena cava are patent.  Inflow from the right renal vein is also noted indicating patency.  The wire was advanced into the suprarenal IVC.  The 6-French 90 cm length AngioJet device was then used to power pulse spray a total of 6 mg of TPA and 125 ml of saline from the popliteal vein throughout the left lower extremity and into the IVC filter.  A 90 cm total length, 50 cm infusion length Unifuse infusion catheter was then positioned over the wire so that the proximal side hole was just within the filter, and the more proximal side hole in the mid thigh.  The sheath and catheter were secured to the skin with post silk suture.  The right popliteal fossa was interrogated with ultrasound.  Again, the popliteal vein was found be expanded completely thrombosed. Local anesthesia was attained by infiltration with 1% lidocaine. Under real time sonographic guidance, the vein was punctured using a 21-gauge micropuncture needle.  Images obtained stored for the medical record.  A 6-French working vascular sheath was then placed over a stiff Amplatz wire after transitioning from the transitional micro sheath.  The wire was advanced into the suprarenal IVC.  Care was taken to navigate the wire through the right aspect of the IVC and the filter to maximize lytic dispersal.  The 6-French AngioJet was then used to power pulse spray a total of 6 mg of TPA and 125 ml of  saline from the popliteal artery into the IVC.  A 130 cm total length 50 cm infusion length Unifuse multi side-hole infusion catheter was then placed in identical position to the contralateral side.  Initial venous lysis was initiated at 0.25 mg connector place through each catheter for a total of 0.5 mg per hour.  Overall, the patient tolerated the procedure very well.  There was no immediate complication.  IMPRESSION:  1.  Inferior venacavagram demonstrates patency of the renal veins and IVC above the level of the filter.  2. Initial limited bilateral lower extremity pharmacomechanical thrombolysis.  3.  Initiation of bilateral lower extremity venous lysis using bilateral multi side-hole infusion catheters.  The patient will lyse for 12-24 hours and return interventional radiology for venogram and potential adjunctive interventions tomorrow.  Signed,  Sterling Big, MD [facility] Radiology   Original Report Authenticated By: Malachy Moan, M.D.   Ir US Guide Vasc Access Right  05/11/2013   *RADIOLOGY REPORT*  PRIOR ULTRASOUND GUIDED VASCULAR ACCESS; IR INFERIOR VENA CAVA GRAM; IR THROMBOLYSIS/THROMBECTOMY BILATERAL LOWER EXTREMITIES; IR INITIATION OF VENOUS LYSIS INITIAL DAY  Date: 05/11/2013  Clinical History: 67 year old male with complicated medical history.  He had a TURP and left double-J ureteral stent placement procedure done in the end of August which was complicated by postoperative PE and subsequent postoperative hemorrhage following initiation of anticoagulation.  Hemostasis was obtained and IVC filter was placed.  The  patient subsequently developed extensive bilateral lower extremity swelling and was found to have extensive bilateral lower extremity and caval DVT extending into the filter.  He is now nearly 4 weeks post operative and showing in significant improvement with systemic heparinization.  He presents for attempted pharmacomechanical  catheter directed thrombolysis/thrombectomy.  Procedures Performed: 1. Ultrasound-guided puncture of the left popliteal vein 2.  Catheterization of the inferior vena cava above the IVC filter with vena cava gram 3.  Power pulse spray infusion of 6 mg of TPA from the popliteal access to the infrarenal IVC 4.  Placement of a 90 cm total length 50 cm infusion length multi side-hole infusion catheter 5.  Ultrasound-guided puncture of the right popliteal vein 6.  Catheterization of the inferior vena cava above the IVC filter 7.  Power pulse spray infusion of 6 mg of TPA from the right popliteal access into the infrarenal IVC 8.  Placement of a 130 cm total length 50 cm infusion length multi side-hole infusion catheter 9.  Initiation of venous thrombolysis  Interventional Radiologist:  Sterling Big, MD  Sedation: Moderate (conscious) sedation was used.  Four mg Versed, 100 mcg Fentanyl were administered intravenously.  The patient's vital signs were monitored continuously by radiology nursing throughout the procedure.  Sedation Time: 60 minutes  Fluoroscopy time: 15 minutes 6 seconds  Contrast volume: 10 ml Omnipaque-300  Intravenous medications:  A total of 12 mg TPA was administered into the thrombus  PROCEDURE/FINDINGS:   Informed consent was obtained from the patient following explanation of the procedure, risks, benefits and alternatives. The patient understands, agrees and consents for the procedure. All questions were addressed. A time out was performed.  Maximal barrier sterile technique utilized including caps, mask, sterile gowns, sterile gloves, large sterile drape, hand hygiene, and betadine skin prep.  The left popliteal fossa was interrogated with ultrasound.  The popliteal vein is expanded and completely thrombosed.  Local anesthesia was attained by infiltration with 1% lidocaine.  Under real time sonographic guidance, the thrombosed vein was punctured with a 21-gauge micropuncture needle.  Images  obtained stored for the medical record.  The transitional micro sheath was upsized to a 6-French working vascular sheath over a short Amplatz wire.  Using a stiff Glidewire and angled catheter, the catheter was used to navigate into the infrarenal IVC just beyond the nose cone of the IVC filter.  An inferior vena cava gram was performed limiting the amount of intravenous contrast used.  The left renal vein and inferior vena cava are patent.  Inflow from the right renal vein is also noted indicating patency.  The wire was advanced into the suprarenal IVC.  The 6-French 90 cm length AngioJet device was then used to power pulse spray a total of 6 mg of TPA and 125 ml of saline from the popliteal vein throughout the left lower extremity and into the IVC filter.  A 90 cm total length, 50 cm infusion length Unifuse infusion catheter was then positioned over the wire so that the proximal side hole was just within the filter, and the more proximal side hole in the mid thigh.  The sheath and catheter were secured to the skin with post silk suture.  The right popliteal fossa was interrogated with ultrasound.  Again, the popliteal vein was found be expanded completely thrombosed. Local anesthesia was attained by infiltration with 1% lidocaine. Under real time sonographic guidance, the vein was punctured using a 21-gauge micropuncture needle.  Images obtained stored for the medical  record.  A 6-French working vascular sheath was then placed over a stiff Amplatz wire after transitioning from the transitional micro sheath.  The wire was advanced into the suprarenal IVC.  Care was taken to navigate the wire through the right aspect of the IVC and the filter to maximize lytic dispersal.  The 6-French AngioJet was then used to power pulse spray a total of 6 mg of TPA and 125 ml of saline from the popliteal artery into the IVC.  A 130 cm total length 50 cm infusion length Unifuse multi side-hole infusion catheter was then placed in  identical position to the contralateral side.  Initial venous lysis was initiated at 0.25 mg connector place through each catheter for a total of 0.5 mg per hour.  Overall, the patient tolerated the procedure very well.  There was no immediate complication.  IMPRESSION:  1.  Inferior venacavagram demonstrates patency of the renal veins and IVC above the level of the filter.  2. Initial limited bilateral lower extremity pharmacomechanical thrombolysis.  3.  Initiation of bilateral lower extremity venous lysis using bilateral multi side-hole infusion catheters.  The patient will lyse for 12-24 hours and return interventional radiology for venogram and potential adjunctive interventions tomorrow.  Signed,  Sterling Big, MD [facility]   Original Report Authenticated By: Malachy Moan, M.D.   Dg Chest Port 1 View  05/13/2013   CLINICAL DATA:  Shortness of breath. Atelectasis.  EXAM: PORTABLE CHEST - 1 VIEW  COMPARISON:  04/29/2013  FINDINGS: Slight improvement in aeration and lung volumes. No confluent opacities currently. Heart is upper limits normal in size. No effusions. No acute bony abnormality.  IMPRESSION: No active disease.   Electronically Signed   By: Charlett Nose M.D.   On: 05/13/2013 05:27   Dg Knee Left Port  05/16/2013   CLINICAL DATA:  Pain and swelling  EXAM: PORTABLE LEFT KNEE - 1-2 VIEW  COMPARISON:  None.  FINDINGS: There is no evidence of fracture or dislocation. Trace suprapatellar fluid identified. There is no focal bone abnormality. Soft tissues are remarkable for mild edema diffusely. Tricompartmental degenerative change noted.  IMPRESSION: Degenerative change without acute osseous abnormality.   Electronically Signed   By: Christiana Pellant M.D.   On: 05/16/2013 21:47   Ct Angio Abd/pel W/ And/or W/o  05/10/2013   *RADIOLOGY REPORT*  Clinical Data: Extensive bilateral DVT, evaluate for caval thrombus.  CT ANGIOGRAPHY  ABDOMEN AND PELVIS WITH CONTRAST AND WITHOUT CONTRAST  Technique:  Multidetector CT imaging of the abdomen and pelvis was performed following the standard protocol during bolus administration of intravenous contrast.  Comparison: Prior CT abdomen/pelvis 04/30/2013  Findings:  Lower Chest:  Mild dependent atelectasis versus scarring in the posterior left lower lobe.  The lung bases are otherwise clear. Visualized heart within normal limits for size.  No pericardial effusion.  Unremarkable distal thoracic esophagus.  Abdomen: Unremarkable CT appearance of the stomach, duodenum, spleen, pancreas and left adrenal gland.  Dystrophic calcification in the right adrenal gland is nonspecific, but stable.  Normal hepatic contours and morphology.  No focal hepatic mass. Gallbladder is unremarkable. No intra or extrahepatic biliary ductal dilatation.  Stable 7.7 cm simple cyst exophytic from the upper pole of the left kidney.  Stable low attenuation left subcapsular fluid collection, likely a liquefied subcapsular hematoma.  No hydronephrosis. Bilateral double J ureteral stents are in place.  There are multiple small parapelvic cysts in the right kidney.  Small exophytic  renal cortical cyst on the right.  Symmetric bilateral renal perfusion.  No bowel obstruction or focal bowel wall thickening.  No free fluid or suspicious adenopathy.  Pelvis: The bladder is decompressed.  There is a suprapubic catheter as well as to double-J ureteral stents.  Surgical changes of prior TURP.  No free fluid or suspicious adenopathy.  Bones: No acute fracture or aggressive appearing lytic or blastic osseous lesion.  Vascular: Extensive thrombus beginning in the inferior vena cava contained within the IVC filter and extending through out of the bilateral iliac and femoral venous systems.  Thrombus likely extends into the superficial great saphenous veins bilaterally as well.  No significant provocation superior to the filter.  The renal veins are  patent bilaterally.  Mild scattered atherosclerotic vascular calcifications.  No acute arterial abnormality.  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  No evidence of propagation more centrally within the inferior vena cava.  The bilateral renal veins remain widely patent.  Additional ancillary findings as above without significant interval change.   Original Report Authenticated By: Malachy Moan, M.D.   Ir Infusion Thrombol Venous Initial (ms)  05/11/2013   *RADIOLOGY REPORT*  PRIOR ULTRASOUND GUIDED VASCULAR ACCESS; IR INFERIOR VENA CAVA GRAM; IR THROMBOLYSIS/THROMBECTOMY BILATERAL LOWER EXTREMITIES; IR INITIATION OF VENOUS LYSIS INITIAL DAY  Date: 05/11/2013  Clinical History: 67 year old male with complicated medical history.  He had a TURP and left double-J ureteral stent placement procedure done in the end of August which was complicated by postoperative PE and subsequent postoperative hemorrhage following initiation of anticoagulation.  Hemostasis was obtained and IVC filter was placed.  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  He is now nearly 4 weeks post operative and showing in significant improvement with systemic heparinization.  He presents for attempted pharmacomechanical catheter directed thrombolysis/thrombectomy.  Procedures Performed: 1. Ultrasound-guided puncture of the left popliteal vein 2.  Catheterization of the inferior vena cava above the IVC filter with vena cava gram 3.  Power pulse spray infusion of 6 mg of TPA from the popliteal access to the infrarenal IVC 4.  Placement of a 90 cm total length 50 cm infusion length multi side-hole infusion catheter 5.  Ultrasound-guided puncture of the right popliteal vein 6.  Catheterization of the inferior vena cava above the IVC  filter 7.  Power pulse spray infusion of 6 mg of TPA from the right popliteal access into the infrarenal IVC 8.  Placement of a 130 cm total length 50 cm infusion length multi side-hole infusion catheter 9.  Initiation of venous thrombolysis  Interventional Radiologist:  Sterling Big, MD  Sedation: Moderate (conscious) sedation was used.  Four mg Versed, 100 mcg Fentanyl were administered intravenously.  The patient's vital signs were monitored continuously by radiology nursing throughout the procedure.  Sedation Time: 60 minutes  Fluoroscopy time: 15 minutes 6 seconds  Contrast volume: 10 ml Omnipaque-300  Intravenous medications:  A total of 12 mg TPA was administered into the thrombus  PROCEDURE/FINDINGS:   Informed consent was obtained from the patient following explanation of the procedure, risks, benefits and alternatives. The patient understands, agrees and consents for the procedure. All questions were addressed. A time out was performed.  Maximal barrier sterile technique utilized including caps, mask, sterile gowns, sterile gloves, large sterile drape, hand hygiene, and betadine skin prep.  The left  popliteal fossa was interrogated with ultrasound.  The popliteal vein is expanded and completely thrombosed.  Local anesthesia was attained by infiltration with 1% lidocaine.  Under real time sonographic guidance, the thrombosed vein was punctured with a 21-gauge micropuncture needle.  Images obtained stored for the medical record.  The transitional micro sheath was upsized to a 6-French working vascular sheath over a short Amplatz wire.  Using a stiff Glidewire and angled catheter, the catheter was used to navigate into the infrarenal IVC just beyond the nose cone of the IVC filter.  An inferior vena cava gram was performed limiting the amount of intravenous contrast used.  The left renal vein and inferior vena cava are patent.  Inflow from the right renal vein is also noted indicating patency.  The  wire was advanced into the suprarenal IVC.  The 6-French 90 cm length AngioJet device was then used to power pulse spray a total of 6 mg of TPA and 125 ml of saline from the popliteal vein throughout the left lower extremity and into the IVC filter.  A 90 cm total length, 50 cm infusion length Unifuse infusion catheter was then positioned over the wire so that the proximal side hole was just within the filter, and the more proximal side hole in the mid thigh.  The sheath and catheter were secured to the skin with post silk suture.  The right popliteal fossa was interrogated with ultrasound.  Again, the popliteal vein was found be expanded completely thrombosed. Local anesthesia was attained by infiltration with 1% lidocaine. Under real time sonographic guidance, the vein was punctured using a 21-gauge micropuncture needle.  Images obtained stored for the medical record.  A 6-French working vascular sheath was then placed over a stiff Amplatz wire after transitioning from the transitional micro sheath.  The wire was advanced into the suprarenal IVC.  Care was taken to navigate the wire through the right aspect of the IVC and the filter to maximize lytic dispersal.  The 6-French AngioJet was then used to power pulse spray a total of 6 mg of TPA and 125 ml of saline from the popliteal artery into the IVC.  A 130 cm total length 50 cm infusion length Unifuse multi side-hole infusion catheter was then placed in identical position to the contralateral side.  Initial venous lysis was initiated at 0.25 mg connector place through each catheter for a total of 0.5 mg per hour.  Overall, the patient tolerated the procedure very well.  There was no immediate complication.  IMPRESSION:  1.  Inferior venacavagram demonstrates patency of the renal veins and IVC above the level of the filter.  2. Initial limited bilateral lower extremity pharmacomechanical thrombolysis.  3.  Initiation of bilateral lower extremity venous lysis using  bilateral multi side-hole infusion catheters.  The patient will lyse for 12-24 hours and return interventional radiology for venogram and potential adjunctive interventions tomorrow.  Signed,  Sterling Big, MD [facility] Radiology   Original Report Authenticated By: Malachy Moan, M.D.   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 Status post initiation of catheter directed venous thrombolytic therapy via bilateral popliteal access yesterday.  EXAM: 1. Followup angiography of bilateral lower extremities and IVC on completion of thrombolytic therapy.  2.  Venous angioplasty of right external and common iliac veins  FLUOROSCOPY TIME:  FLUOROSCOPY TIME  3 min and 12 seconds.  PROCEDURE: In a prone position, both pre-existing popliteal venous sheaths and infusion catheters were prepped with Betadine. Venography was performed of both lower extremities with injection of bilateral popliteal sheathes as well as both indwelling infusion catheters to assess venous patency from the level of the popliteal veins bilaterally through the iliac veins and inferior vena cava, including at the level of the IVC filter.  Right common iliac and external iliac veins were dilated with a 10 mm x 4 cm Conquest balloon. Additional venography was performed at the level of the common femoral veins bilaterally via 5 French catheters. Upon completion of the procedure, IV heparin was turned off and manual compression was held at both popliteal venous access sites after sheath removal. Thrombolytic therapy was discontinued.  Complications: None  FINDINGS: Venography demonstrates dramatically improved patency of bilateral popliteal and femoral veins in both lower extremities with antegrade flow reestablished and no significant residual thrombus present. A minimal amount of adherent mural thrombus was present in  the right femoral vein. Left-sided iliac veins showed good patency and flow with no significant narrowing identified.  Right-sided iliac veins showed less rapid antegrade flow with some mural thrombus and stenosis present at the level of the common and external iliac veins. Iliac venous patency improved significantly after 10 mm balloon angioplasty with excellent antegrade flow present.  Visualized segment of the inferior vena cava shows no significant residual thrombus with flow present, including at the level of the indwelling IVC filter. There may be a minimal amount of residual mural thrombus remaining in the lower IVC which does not appear to be impeding flow.  IMPRESSION: Dramatic improvement in patency of bilateral lower extremity deep veins, iliac veins and the IVC. There was some sluggish flow associated with an area of stenosis of the right external and common iliac veins. This segment was treated with 10 mm balloon angioplasty with improved result.  PREPERATION AND PROCEDURE MEDICATIONS: 4.0 mg IV Versed and and 200 mcg IV fentanyl.  Sedation time: 45 min   Electronically Signed   By: Irish Lack   On: 05/12/2013 15:57    CBC  Recent Labs Lab 05/22/13 0555 05/23/13 0555 05/24/13 0540 05/24/13 1659 05/25/13 0550 05/26/13 0736  WBC 6.1 6.0 5.2  --  6.5 5.3  HGB 8.5* 8.3* 7.4* 8.0* 7.3* 7.4*  HCT 25.7* 25.5* 22.9* 24.7* 22.1* 23.6*  PLT 247 274 272  --  263 277  MCV 88.0 88.2 87.4  --  87.7 91.1  MCH 29.1 28.7 28.2  --  29.0 28.6  MCHC 33.1 32.5 32.3  --  33.0 31.4  RDW 16.0* 16.1* 16.2*  --  16.0* 16.8*    Chemistries   Recent Labs Lab 05/20/13 0220 05/21/13 0615 05/24/13 0540 05/25/13 0550  NA 130* 131* 136 134*  K 4.5 4.4 4.4 4.4  CL 101 102 105 104  CO2 22 23 26 24   GLUCOSE 112* 103* 113* 129*  BUN 26* 17 15 17   CREATININE 1.31 1.13 1.10 1.12  CALCIUM 6.8* 7.4* 7.5* 8.0*    ------------------------------------------------------------------------------------------------------------------ estimated creatinine clearance is 84.5 ml/min (by C-G formula based on Cr of 1.12). ------------------------------------------------------------------------------------------------------------------ No results found for this basename: HGBA1C,  in the last 72 hours ------------------------------------------------------------------------------------------------------------------ No results found for this basename: CHOL, HDL, LDLCALC, TRIG, CHOLHDL, LDLDIRECT,  in the last 72 hours ------------------------------------------------------------------------------------------------------------------ No results found for this basename: TSH, T4TOTAL, FREET3, T3FREE, THYROIDAB,  in the last 72 hours ------------------------------------------------------------------------------------------------------------------  Recent Labs  05/23/13 1228  VITAMINB12 396  FOLATE 6.3  FERRITIN 1155*  TIBC 145*  IRON 40*  RETICCTPCT 8.0*    Coagulation profile No results found for this basename: INR, PROTIME,  in the last 168 hours  No results found for this basename: DDIMER,  in the last 72 hours  Cardiac Enzymes No results found for this basename: CK, CKMB, TROPONINI, MYOGLOBIN,  in the last 168 hours ------------------------------------------------------------------------------------------------------------------ No components found with this basename: POCBNP,

## 2013-05-26 NOTE — Progress Notes (Signed)
Physical Therapy Treatment Patient Details Name: Jack [name] MRN: [id number] DOB: 07-11-1946 Today's Date: 05/26/2013 Time: 4098-1191 PT Time Calculation (min): 29 min  PT Assessment / Plan / Recommendation  History of Present Illness 89 M underwent TURP and L ureteral stent 8/29. Admitted to [facility] 9/02 with acute PE and anticoagulation initiated. Hospitalization c/b hematuria, progressive renal linsufficiency and anemia requiring transfusion. Bladder irrigations initiated and CT scan abdomen performed 9/03 revealed L renal fluid subcapsular fluid collection thought to represent hematoma. Transferred to [facility] 9/05 for impending need for HD and placement of IVC filter. Currently has a suprapubic catheter with CBI.  Pt with bil LE fasciotomies 10/1 and 10/4 with RLE closed 10/6 and LLE closed 10/8. Pt in bed 10/1-10/9   PT Comments   The pt is progressing well with mobility and is ready for pre-gait and gait activities.  The more therapy he gets at this point, the faster he will recover.  He is limited by strength in the legs and pain in standing.   Since CIR is unable to admit this pt he would be next most appropriate for SNF level rehabilitation.    Follow Up Recommendations  SNF;Other (comment) (pt denied CIR)     Does the patient have the potential to tolerate intense rehabilitation    Yes  Barriers to Discharge   None      Equipment Recommendations  3in1 (PT)    Recommendations for Other Services   None  Frequency Min 2X/week   Progress towards PT Goals Progress towards PT goals: Progressing toward goals  Plan Discharge plan needs to be updated;Frequency needs to be updated    Precautions / Restrictions Precautions Precautions: Fall   Pertinent Vitals/Pain Increased leg pain bil in standing R>L leg.  Pt did not rate, pain relieved when pt positioned back in supine with legs propped up.     Mobility  Bed Mobility Supine to Sit: 5: Supervision;With rails;HOB  elevated Sitting - Scoot to Edge of Bed: 5: Supervision;With rail Sit to Supine: 5: Supervision;With rail;HOB flat Details for Bed Mobility Assistance: Pt relying heavily on railing and momentum for bed mobility both into and out of bed, but no external assist needed.  Cues for safety Transfers Transfers: Sit to Stand;Stand to Sit Sit to Stand: 1: +2 Total assist;With upper extremity assist;From bed;From elevated surface Sit to Stand: Patient Percentage: 70% Stand to Sit: 1: +2 Total assist;With upper extremity assist;With armrests;To bed;To elevated surface Stand to Sit: Patient Percentage: 70% Stand Pivot Transfers: Other (comment) (NT due to pending proceedure) Details for Transfer Assistance: Stood x 3 EOB with RW.  Each stand was better than the previous stand.  Bed elevated for maximum advantage.   Ambulation/Gait Ambulation/Gait Assistance: Not tested (comment) (pt not strong enough yet, but will be soon)    Exercises General Exercises - Lower Extremity Ankle Circles/Pumps: AROM;Both;20 reps;Supine Quad Sets: AROM;Both;10 reps;Supine Short Arc Quad: AROM;Both;10 reps;Supine Heel Slides: AROM;Both;10 reps;Supine Hip ABduction/ADduction: AROM;Both;10 reps;Supine Straight Leg Raises: AROM;Both;10 reps;Supine     PT Goals (current goals can now be found in the care plan section) Acute Rehab PT Goals Patient Stated Goal: To get better  Visit Information  Last PT Received On: 05/26/13 Assistance Needed: +2 History of Present Illness: 63 M underwent TURP and L ureteral stent 8/29. Admitted to [facility] 9/02 with acute PE and anticoagulation initiated. Hospitalization c/b hematuria, progressive renal linsufficiency and anemia requiring transfusion. Bladder irrigations initiated and CT scan abdomen performed 9/03 revealed L  renal fluid subcapsular fluid collection thought to represent hematoma. Transferred to [facility] 9/05 for impending need for HD and placement of IVC filter. Currently  has a suprapubic catheter with CBI.  Pt with bil LE fasciotomies 10/1 and 10/4 with RLE closed 10/6 and LLE closed 10/8. Pt in bed 10/1-10/9    Subjective Data  Subjective: Pt reports he is feeling stronger, still has significant bil feet and lower leg pain with standing.   Patient Stated Goal: To get better   Cognition  Cognition Arousal/Alertness: Awake/alert Behavior During Therapy: WFL for tasks assessed/performed Overall Cognitive Status: Within Functional Limits for tasks assessed    Balance  Static Sitting Balance Static Sitting - Balance Support: No upper extremity supported;Feet supported Static Sitting - Level of Assistance: 6: Modified independent (Device/Increase time) Static Standing Balance Static Standing - Balance Support: Bilateral upper extremity supported Static Standing - Level of Assistance: 1: +2 Total assist;Other (comment) (pt 70%) Static Standing - Comment/# of Minutes: pt relies heavily on bil upper extremity and bed supporting lower legs to achieve fully upright standing.  He is unable to weight shift to take steps on his right leg at this time. He can stand and move his left foot around.  Two people needed to support his trunk for balance and safety.  stood <1 min with each consecutive stand.    End of Session PT - End of Session Activity Tolerance: Patient limited by fatigue;Patient limited by pain Patient left: in bed;with call bell/phone within reach;with family/visitor present Nurse Communication: Mobility status;Need for lift equipment (steady would be helpful for transfer to the chair.  )    Lurena Joiner B. Jonni Oelkers, PT, DPT [phone]   05/26/2013, 2:58 PM

## 2013-05-26 NOTE — Progress Notes (Signed)
5 Days Post-Op Subjective: Patient reports feeling well.  Denies any problems with his SP tube.  Objective: Vital signs in last 24 hours: Temp:  [98.4 F (36.9 C)] 98.4 F (36.9 C) (10/13 0646) Pulse Rate:  [84-86] 84 (10/13 0646) Resp:  [18] 18 (10/13 0646) BP: (124-134)/(56-66) 134/66 mmHg (10/13 0646) SpO2:  [98 %-99 %] 98 % (10/13 0646) Weight:  [127.098 kg (280 lb 3.2 oz)] 127.098 kg (280 lb 3.2 oz) (10/13 0500)  Intake/Output from previous day: 10/12 0701 - 10/13 0700 In: 3348 [P.O.:958; I.V.:2390] Out: 3450 [Urine:3450] Intake/Output this shift: Total I/O In: 360 [P.O.:360] Out: 350 [Urine:350]  Physical Exam:  General:alert, cooperative and no distress GI: soft Midline Incision healing well; sutures in place  Lab Results:  Recent Labs  05/24/13 1659 05/25/13 0550 05/26/13 0736  HGB 8.0* 7.3* 7.4*  HCT 24.7* 22.1* 23.6*   BMET  Recent Labs  05/24/13 0540 05/25/13 0550  NA 136 134*  K 4.4 4.4  CL 105 104  CO2 26 24  GLUCOSE 113* 129*  BUN 15 17  CREATININE 1.10 1.12  CALCIUM 7.5* 8.0*   No results found for this basename: LABPT, INR,  in the last 72 hours No results found for this basename: LABURIN,  in the last 72 hours Results for orders placed during the hospital encounter of 04/30/13  CULTURE, BLOOD (ROUTINE X 2)     Status: None   Collection Time    04/30/13  5:30 AM      Result Value Range Status   Specimen Description BLOOD LEFT HAND   Final   Special Requests BOTTLES DRAWN AEROBIC ONLY 3CC   Final   Culture  Setup Time     Final   Value: 04/30/2013 13:06     Performed at [facility]   Culture     Final   Value: NO GROWTH 5 DAYS     Performed at [facility]   Report Status 05/06/2013 FINAL   Final  CULTURE, BLOOD (ROUTINE X 2)     Status: None   Collection Time    04/30/13  5:35 AM      Result Value Range Status   Specimen Description BLOOD RIGHT HAND   Final   Special Requests BOTTLES DRAWN AEROBIC ONLY 4CC    Final   Culture  Setup Time     Final   Value: 04/30/2013 13:06     Performed at [facility]   Culture     Final   Value: NO GROWTH 5 DAYS     Performed at [facility]   Report Status 05/06/2013 FINAL   Final  MRSA PCR SCREENING     Status: None   Collection Time    05/11/13  6:17 PM      Result Value Range Status   MRSA by PCR NEGATIVE  NEGATIVE Final   Comment:            The GeneXpert MRSA Assay (FDA     approved for NASAL specimens     only), is one component of a     comprehensive MRSA colonization     surveillance program. It is not     intended to diagnose MRSA     infection nor to guide or     monitor treatment for     MRSA infections.  CLOSTRIDIUM DIFFICILE BY PCR     Status: Abnormal   Collection Time    05/16/13  2:34 AM  Result Value Range Status   C difficile by pcr POSITIVE (*) NEGATIVE Final   Comment: CRITICAL RESULT CALLED TO, READ BACK BY AND VERIFIED WITH:     Pennelope Bracken 05/16/13 1120 BY K SCHULTZ  MRSA PCR SCREENING     Status: None   Collection Time    05/16/13  3:26 PM      Result Value Range Status   MRSA by PCR NEGATIVE  NEGATIVE Final   Comment:            The GeneXpert MRSA Assay (FDA     approved for NASAL specimens     only), is one component of a     comprehensive MRSA colonization     surveillance program. It is not     intended to diagnose MRSA     infection nor to guide or     monitor treatment for     MRSA infections.    Studies/Results: Ct Abdomen Pelvis W Wo Contrast  05/24/2013   CLINICAL DATA:  67 year old male with hematuria. Patient with bilateral urinary stents and subcapsular hematoma. Marland Kitchen  EXAM: CT ABDOMEN AND PELVIS WITHOUT AND WITH CONTRAST  TECHNIQUE: Multidetector CT imaging of the abdomen and pelvis was performed without contrast material in one or both body regions, followed by contrast material(s) and further sections in one or both body regions. The patient was pre-medicated by the clinician due to  contrast allergy history.  CONTRAST:  OMNIPAQUE IOHEXOL 300 MG/ML  SOLN  COMPARISON:  05/13/2013 and prior CTs  FINDINGS: Tiny bilateral pleural effusions are noted.  The left subcapsular renal hematoma has decreased in size, now measuring 10 mm in thickness, previously 16 mm.  Bilateral renal atrophy, moderate to severe renal cortical thinning, left renal cyst, nonobstructing left renal calculi and bilateral urinary stents are unchanged. The urinary stents are unchanged in appearance with tips in the bladder and renal collecting systems.  Mild right hydronephrosis is slightly increased since the prior study. On the delayed images, small filling defects within the right renal calices are noted and may represent small clots, sloughed papilla (papillary necrosis, but not identified on the left side), atypical infection or less likely non opaque calculi. Other etiologies are less likely.  Suprapubic catheter and thick walled bladder are unchanged.  An infrarenal IVC filter is again identified.  The liver, spleen, pancreas, gallbladder and adrenal glands are within normal limits.  No free fluid, enlarged lymph nodes, biliary dilatation or abdominal aortic aneurysm noted.  The bowel, bladder and appendix are unremarkable. There is no evidence of bowel obstruction, abscess or pneumoperitoneum.  No acute or suspicious bony abnormalities are identified.  IMPRESSION: Decreased left subcapsular renal hematoma, now measuring 10 mm, previously 16 mm.  Slightly increasing mild right hydronephrosis. Small filling defects within the right renal calices now noted and differential includes small clots, atypical/fungal infection or less likely non opaque calculi. Uroepithelial neoplasm is considered less likely.  Unchanged bilateral renal atrophy, nonobstructing left renal calculi, bilateral urinary stents and thick-walled bladder.   Electronically Signed   By: Laveda Abbe M.D.   On: 05/24/2013 16:43    Procedure: Current SP  tube balloon deflated and removed with difficulty.  Area prepped with betadine.  New 67f foley inserted easily and balloon inflated with 10cc sterile saline.  Connected to foley bag.  Pt tolerated well.    Assessment/Plan:  Doing well  Change SP q 1 month    LOS: 26 days   YARBROUGH,Kenzie Flakes G. 05/26/2013,  2:34 PM

## 2013-05-27 LAB — BASIC METABOLIC PANEL
BUN: 14 mg/dL (ref 6–23)
CO2: 26 mEq/L (ref 19–32)
Calcium: 7.8 mg/dL — ABNORMAL LOW (ref 8.4–10.5)
Creatinine, Ser: 0.98 mg/dL (ref 0.50–1.35)
GFR calc non Af Amer: 83 mL/min — ABNORMAL LOW (ref >90)
Sodium: 137 mEq/L (ref 135–145)

## 2013-05-27 LAB — URINE MICROSCOPIC-ADD ON

## 2013-05-27 LAB — CBC
MCHC: 31.4 g/dL (ref 30.0–36.0)
MCV: 91 fL (ref 78.0–100.0)
Platelets: 286 10*3/uL (ref 150–400)
RBC: 2.66 MIL/uL — ABNORMAL LOW (ref 4.22–5.81)
WBC: 4.8 10*3/uL (ref 4.0–10.5)

## 2013-05-27 LAB — TYPE AND SCREEN
ABO/RH(D): O NEG
Antibody Screen: NEGATIVE

## 2013-05-27 LAB — URINALYSIS, ROUTINE W REFLEX MICROSCOPIC
Ketones, ur: NEGATIVE mg/dL
Nitrite: NEGATIVE
Protein, ur: 30 mg/dL — AB
Urobilinogen, UA: 0.2 mg/dL (ref 0.0–1.0)

## 2013-05-27 MED ORDER — VITAMIN B-12 1000 MCG PO TABS
1000.0000 ug | ORAL_TABLET | Freq: Every day | ORAL | Status: DC
  Administered 2013-05-27 – 2013-05-29 (×3): 1000 ug via ORAL
  Filled 2013-05-27 (×3): qty 1

## 2013-05-27 MED ORDER — RIVAROXABAN 15 MG PO TABS
15.0000 mg | ORAL_TABLET | Freq: Two times a day (BID) | ORAL | Status: DC
  Administered 2013-05-27: 15 mg via ORAL
  Filled 2013-05-27 (×3): qty 1

## 2013-05-27 MED ORDER — RIVAROXABAN 20 MG PO TABS: 20.0000 mg | ORAL_TABLET | Freq: Every day | ORAL | Status: DC

## 2013-05-27 NOTE — Progress Notes (Signed)
[facility]                                                                                Patient Demographics  Jack [name], is a 67 y.o. male, DOB - [birth year], ZOX:[id number]  Admit date - 04/30/2013   Admitting Physician Hillary Bow, DO  Outpatient Primary MD for the patient is [facility], MD  LOS - 27   No chief complaint on file.       Assessment & Plan  S/p transurethral resection of prostate with left ureteroscopic stone manipulation 04/13/13 complicated by perinephric hematoma with bilateral hydronephrosis  underwent urgent cystoscopy 04/18/2013 with bilateral stent placement and bladder evacuation with suprapubic tube placement  - d/w Urology 10/10 - cap suprapubic cath and follow post void residuals- if he is doing well, can ask them next week to remove the cath.  -CT showed decreasing size of hematoma -Urology consulted and replaced suprapubic catheter -Collection bag no longer has blood, clear  Bilateral lower extremity DVT / PE  -status post IVC filter 9/5 - given recent severe complications related to bleeding anticoagulation was not felt to be wise at the time of admission - further review of records revealed Dopplers from 9/7 which were negative for DVT - repeat dopplers this hospitalization confirmed extensive bilateral lower extremity DVTs - d/c'ed testosterone replacement  - due to fear of propagation of clot through IVC filter, heparin was subsequently re-intitiated on 9/22 for anticoag w/ closer monitoring -  - clot extended from lower extremities through IVC and involved filter- he underwent thrombolysis by IR- then developed compartment syndrome and was taken to OR on 10/1right calf fasciotomy  -back to OR on 10/6 to close faciotomy right leg to close - left leg had fasciotomy on 10/4 with closure 10/8 - on heparin  -No longer on Heparin, foot pumps in place -Will place patient on Xarelto and continue to monitor him for 24 hours for any signs of  rebleeding.    B LE compartment syndrome due to bleeding/hematoma  -S/p B/L fasciotomies - Vasc Surgery has been following -  - Oxycodone Q6 routine with PRN IV Morphine to control pain   C diff colitis  -started flagyl 10/3 for 2 wk course - diarrhea resolved  Acute renal failure/ CKD 3  -Resolved.  Cr 0.98.  (was likely due to prerenal vs ATN from Hypotension) -Nephrology signed off.   -ultrasound and Ct abd/pelvis revealed no acute findings  Progressive anemia / Iron deficiency  -Multifactorial causes: significant recent acute loss plus decreased production  -Will continue Iron supplementation -Hb 7.6, trending upward. Patient has received multiple transfusions during hospital course -Vit B 12- low normal, will continue to supplement   Hemorrhoidal GI bleed  -bright red blood streaking in stools 9/25 - appears to be from hemorrhoids - no further GI bleeding since 10/3- mucous stools   Hypotension -  BP has now stabilized   Hyperkalemia  resolved   Principal Problem:   Acute renal failure Active Problems:   Chronic renal insufficiency   Sepsis   Hyperkalemia   DVT, bilateral lower limbs   GI bleed   Pulmonary embolism 9/2   Code Status: Full  Family Communication: None at this time.  Disposition Plan: Admitted.  Looking for SNF placement.   Procedures  8/29 - TURP w/ L ureteral stent [facility]  9/02 - admit [facility] w/ V/Q "high probability" for PE >> anticoag >> severe hematuria + anemia + renal failure  9/3 - CT abdom reveals large L renal capsular hematoma  9/5 - transferred to [facility] to PCCM service - noted to have large clot in bladder w/ B hydronephrosis  9/5 - IVC filter placement  9/6 - B ureteral stents placed - open evacuation of clot from bladder- suprapubic tube placed  9/7- venous duplex lower extremity - NO DVT  9/21-venous duplex lower extremity- Bilateral: Positive for DVT in the common femoral, profunda, femoral, popliteal, and  posterior tibial veins with superficial thrombus in the greater and lesser saphenous veins.  9/22 - IV heparin initiated for DVTs (anticoagulation had been on hold due to renal hematoma)  9/28: Decision was made to perform a catheter directed lysis of clot and patient was transferred to the ICU for monitoring and further care while on lytics.  9/28-Bilateral popliteal venous acc with initiation of bilateral LE and IVC thrombolysis, popliteal sheaths  9/29- IR inferior venacavagram, thrombolysis/thrombectomy bilateral lower extremities showing improved patency, IVC patent. Advised d/c lytic therapy, continue heparin per pharmacy  9/30 - CT abd/pelvis showed stable L renal hematoma  10/1- Acute hematoma rt leg below knee, compartment syndrome  10/1- calf fasciotomy performed  10/4 LEFT calf fasciotomy performed  10/6 taken back to OR to close RIGHT fasciotomy  10/8 to OR to close LEFT fasciotomy  Consults   Nephrology  Vascular Surgery  IR  Urology   DVT Prophylaxis  Foot pumps, start on xarelto today  Lab Results  Component Value Date   PLT 286 05/27/2013    Medications  Scheduled Meds: . hydrocortisone   Rectal BID  . metroNIDAZOLE  500 mg Oral Q8H  . oxyCODONE  5 mg Oral Q6H  . sodium chloride  3 mL Intravenous Q12H   Continuous Infusions: . sodium chloride 10 mL/hr at 05/22/13 1900  . sodium chloride 100 mL/hr at 05/26/13 0316   PRN Meds:.ALPRAZolam, morphine injection, ondansetron (ZOFRAN) IV  Antibiotics    Anti-infectives   Start     Dose/Rate Route Frequency Ordered Stop   05/21/13 0915  ceFAZolin (ANCEF) IVPB 2 g/50 mL premix     2 g 100 mL/hr over 30 Minutes Intravenous  Once 05/21/13 0900 05/21/13 0855   05/21/13 0823  ceFAZolin (ANCEF) 2-3 GM-% IVPB SOLR    Comments:  Hypes, Karen   : cabinet override      05/21/13 0823 05/21/13 2029   05/17/13 1024  ceFAZolin (ANCEF) 1-5 GM-% IVPB    Comments:  BUTLER, DAVE: cabinet override      05/17/13 1024 05/17/13  2229   05/17/13 1023  ceFAZolin (ANCEF) 2-3 GM-% IVPB SOLR    Comments:  BUTLER, DAVE: cabinet override      05/17/13 1023 05/17/13 1035   05/16/13 1400  metroNIDAZOLE (FLAGYL) tablet 500 mg     500 mg Oral 3 times per day 05/16/13 1128 05/30/13 1359   05/14/13 1145  ceFAZolin (ANCEF) IVPB 2 g/50 mL premix     2 g 100 mL/hr over 30 Minutes Intravenous To Surgery 05/14/13 1141 05/14/13 1145   05/03/13 1600  piperacillin-tazobactam (ZOSYN) IVPB 3.375 g     3.375 g 12.5 mL/hr over 240 Minutes Intravenous 3 times per day 05/03/13 1222 05/04/13  6578   05/03/13 1000  vancomycin (VANCOCIN) 1,500 mg in sodium chloride 0.9 % 500 mL IVPB  Status:  Discontinued     1,500 mg 250 mL/hr over 120 Minutes Intravenous Every 48 hours 05/01/13 1105 05/01/13 1403   05/01/13 1000  vancomycin (VANCOCIN) 1,500 mg in sodium chloride 0.9 % 500 mL IVPB  Status:  Discontinued     1,500 mg 250 mL/hr over 120 Minutes Intravenous Every 24 hours 05/01/13 0105 05/01/13 1105   04/30/13 1600  piperacillin-tazobactam (ZOSYN) IVPB 2.25 g  Status:  Discontinued     2.25 g 100 mL/hr over 30 Minutes Intravenous Every 8 hours 04/30/13 1342 05/03/13 1222   04/30/13 1200  vancomycin (VANCOCIN) IVPB 1000 mg/200 mL premix  Status:  Discontinued     1,000 mg 200 mL/hr over 60 Minutes Intravenous Every 12 hours 04/30/13 0245 04/30/13 1326   04/30/13 0800  piperacillin-tazobactam (ZOSYN) IVPB 3.375 g  Status:  Discontinued     3.375 g 12.5 mL/hr over 240 Minutes Intravenous Every 8 hours 04/30/13 0245 04/30/13 1326   04/30/13 0300  vancomycin (VANCOCIN) 2,000 mg in sodium chloride 0.9 % 500 mL IVPB     2,000 mg 250 mL/hr over 120 Minutes Intravenous  Once 04/30/13 0245 04/30/13 0510   04/30/13 0300  piperacillin-tazobactam (ZOSYN) IVPB 3.375 g     3.375 g 100 mL/hr over 30 Minutes Intravenous  Once 04/30/13 0245 04/30/13 0340       Time Spent in minutes   30 minutes   Sir Mallis D.O. on 05/27/2013 at 8:56  AM  Between 7am to 7pm - Pager - [phone]  After 7pm go to [email or web address] - password [email or web address]  And look for the night coverage person covering for me after hours  [facility] Office  [phone]    Subjective:   Addie Cederberg seen and examined today.  Patient has no complaints today.  He continues to feel weak.  Patient denies any other episodes of diarrhea. Patient denies dizziness, chest pain, shortness of breath, abdominal pain, N/V/D/C, new weakness, numbess, tingling.    Objective:   Filed Vitals:   05/26/13 0646 05/26/13 1747 05/26/13 2123 05/27/13 0539  BP: 134/66 124/55 100/61 109/65  Pulse: 84 82 81 85  Temp: 98.4 F (36.9 C) 98.6 F (37 C) 98.5 F (36.9 C) 97 F (36.1 C)  TempSrc: Oral Oral Oral Oral  Resp: 18 18 20 17   Height:   5\' 9"  (1.753 m)   Weight:   126.962 kg (279 lb 14.4 oz)   SpO2: 98% 95% 95% 96%    Wt Readings from Last 3 Encounters:  05/26/13 126.962 kg (279 lb 14.4 oz)  05/26/13 126.962 kg (279 lb 14.4 oz)  05/26/13 126.962 kg (279 lb 14.4 oz)     Intake/Output Summary (Last 24 hours) at 05/27/13 0856 Last data filed at 05/27/13 0543  Gross per 24 hour  Intake   1910 ml  Output   1500 ml  Net    410 ml    Exam  General: Well developed, well nourished, NAD, appears stated age  HEENT: NCAT, PERRLA, EOMI, Anicteic Sclera, mucous membranes moist.   Neck: Supple, no JVD, no masses  Cardiovascular: S1 S2 auscultated, no rubs, murmurs or gallops.  Regular rate and rhythm.  Respiratory: Clear to auscultation bilaterally with equal chest rise  Abdomen: Soft, obese, nondistended, nontender, + bowel sounds, suprapubic catheter in place  Extremities: Lower extremities both are wrapped. Otherwise warm dry without cyanosis clubbing.  Edema note in lower extremities bilaterally.  Neuro: AAOx3, cranial nerves grossly intact.   Skin: Without rashes exudates or nodules  Psych: Normal affect and demeanor with intact judgement and  insight, slightly depressed    Data Review   Micro Results No results found for this or any previous visit (from the past 240 hour(s)).  Radiology Reports Ct Abdomen Pelvis W Wo Contrast  05/24/2013   CLINICAL DATA:  67 year old male with hematuria. Patient with bilateral urinary stents and subcapsular hematoma. Marland Kitchen  EXAM: CT ABDOMEN AND PELVIS WITHOUT AND WITH CONTRAST  TECHNIQUE: Multidetector CT imaging of the abdomen and pelvis was performed without contrast material in one or both body regions, followed by contrast material(s) and further sections in one or both body regions. The patient was pre-medicated by the clinician due to contrast allergy history.  CONTRAST:  OMNIPAQUE IOHEXOL 300 MG/ML  SOLN  COMPARISON:  05/13/2013 and prior CTs  FINDINGS: Tiny bilateral pleural effusions are noted.  The left subcapsular renal hematoma has decreased in size, now measuring 10 mm in thickness, previously 16 mm.  Bilateral renal atrophy, moderate to severe renal cortical thinning, left renal cyst, nonobstructing left renal calculi and bilateral urinary stents are unchanged. The urinary stents are unchanged in appearance with tips in the bladder and renal collecting systems.  Mild right hydronephrosis is slightly increased since the prior study. On the delayed images, small filling defects within the right renal calices are noted and may represent small clots, sloughed papilla (papillary necrosis, but not identified on the left side), atypical infection or less likely non opaque calculi. Other etiologies are less likely.  Suprapubic catheter and thick walled bladder are unchanged.  An infrarenal IVC filter is again identified.  The liver, spleen, pancreas, gallbladder and adrenal glands are within normal limits.  No free fluid, enlarged lymph nodes, biliary dilatation or abdominal aortic aneurysm noted.  The bowel, bladder and appendix are unremarkable. There is no evidence of bowel obstruction, abscess or  pneumoperitoneum.  No acute or suspicious bony abnormalities are identified.  IMPRESSION: Decreased left subcapsular renal hematoma, now measuring 10 mm, previously 16 mm.  Slightly increasing mild right hydronephrosis. Small filling defects within the right renal calices now noted and differential includes small clots, atypical/fungal infection or less likely non opaque calculi. Uroepithelial neoplasm is considered less likely.  Unchanged bilateral renal atrophy, nonobstructing left renal calculi, bilateral urinary stents and thick-walled bladder.   Electronically Signed   By: Laveda Abbe M.D.   On: 05/24/2013 16:43   Ct Abdomen Pelvis Wo Contrast  05/13/2013   *RADIOLOGY REPORT*  Clinical Data: reassess renal hematomas  CT ABDOMEN AND PELVIS WITHOUT CONTRAST  Technique:  Multidetector CT imaging of the abdomen and pelvis was performed following the standard protocol without intravenous contrast.  Comparison: 05/10/13  Findings: Slight increase in small bilateral pleural effusions.  Liver, gallbladder, spleen, pancreas, and adrenal glands normal and unchanged, except for incidental punctate calcification right adrenal gland.  Bilateral ureteral stents unchanged.  16 mm thick  subcapsular hematoma left kidney stable.  Large left upper pole cyst stable. Small right exophytic mid pole cyst stable.  Suprapubic bladder catheter unchanged.  IVC filter stable.  Bowel remains unremarkable.  IMPRESSION: Stable subcapsular left renal hematoma.Small bilateral pleural effusions, mildly increased.   Original Report Authenticated By: Esperanza Heir, M.D.   Ct Abdomen Pelvis Wo Contrast  04/30/2013   *RADIOLOGY REPORT*  Clinical Data:   Acute renal failure.  CT ABDOMEN AND PELVIS WITHOUT CONTRAST  Technique:  Multidetector CT imaging of the abdomen and pelvis was performed following the standard protocol without intravenous contrast.  Comparison: CT scan from 04/16/2013  Findings: Focal atelectasis or pneumonia is seen in the  posterior left base.  No focal abnormalities seen in the liver or spleen on this study performed without intravenous contrast material.  The stomach is decompressed.  The duodenum, pancreas, gallbladder, and adrenal glands are unremarkable.  Stable appearance of the 7.8 cm cyst in the upper pole of the left kidney.  6 mm nonobstructing stone is seen in the lower pole of the left kidney.  The left sub capsular hematoma has decreased in the interval measuring about 1.7 cm in thickness today compared 2.4 cm previously.  The left double-J internal ureteral stent remains in place and is stable in position. There is been interval placement of a right double-J internal ureteral stent with the proximal loop formed and upper pole calix and the lower loop formed in the urinary bladder.  IVC filter again noted.  The no abdominal aortic aneurysm.  No free fluid or lymphadenopathy in the abdomen.  Imaging through the pelvis shows no apparent midline lower abdominal wound suggesting recent surgery.  Edema or inflammation is seen in the soft tissues of the extraperitoneal pelvic floor. Bilateral inguinal hernias contain only fat.  There is subcutaneous edema in the lower abdomen and pelvis bilaterally.  No colonic diverticulitis.  No evidence for bowel obstruction.  The patient is status post right hemicolectomy.  Urinary bladder is decompressed by a suprapubic tube.  Bone windows reveal no worrisome lytic or sclerotic osseous lesions.  IMPRESSION: Interval decrease in size of the left renal subcapsular fluid collection, suggesting resolving hematoma.  The patient has bilateral internal ureteral stents without overt hydronephrosis at this time.  There is some fullness of the right intrarenal collecting system which is stable to mildly increased in the interval.  Fullness of the left intrarenal collecting system seen previously has decreased in the interval.   Original Report Authenticated By: Kennith Center, M.D.   Dg Chest 2  View  04/27/2013   *RADIOLOGY REPORT*  Clinical Data: Chest pain, extremity numbness  CHEST - 2 VIEW  Comparison: Prior chest x-ray 04/21/2013  Findings: Stable low inspiratory volumes with perhaps trace subsegmental atelectasis.  The lungs are otherwise clear.  Cardiac and mediastinal contours are upper limits of normal but unchanged compared to prior.  No acute osseous abnormality.  Remote healed right-sided rib fractures.  No pleural effusion or pneumothorax.  IMPRESSION: Low inspiratory volumes, but otherwise no acute cardiopulmonary process.   Original Report Authenticated By: Malachy Moan, M.D.   Ir Veno/ext/bi  05/11/2013   *RADIOLOGY REPORT*  PRIOR ULTRASOUND GUIDED VASCULAR ACCESS; IR INFERIOR VENA CAVA GRAM; IR THROMBOLYSIS/THROMBECTOMY BILATERAL LOWER EXTREMITIES; IR INITIATION OF VENOUS LYSIS INITIAL DAY  Date: 05/11/2013  Clinical History: 67 year old male with complicated medical history.  He had a TURP and left double-J ureteral stent placement procedure done in the end of August which was complicated by postoperative PE and subsequent postoperative hemorrhage following initiation of anticoagulation.  Hemostasis was obtained and IVC filter was placed.  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  He is now nearly 4 weeks post operative and showing in significant improvement with systemic heparinization.  He presents for attempted pharmacomechanical catheter directed thrombolysis/thrombectomy.  Procedures Performed: 1. Ultrasound-guided puncture of the left popliteal vein 2.  Catheterization of the inferior vena cava above the IVC filter with vena cava gram 3.  Power pulse spray infusion of 6 mg of TPA from the popliteal access to the infrarenal IVC 4.  Placement of a 90 cm total length 50 cm infusion length multi side-hole infusion catheter 5.   Ultrasound-guided puncture of the right popliteal vein 6.  Catheterization of the inferior vena cava above the IVC filter 7.  Power pulse spray infusion of 6 mg of TPA from the right popliteal access into the infrarenal IVC 8.  Placement of a 130 cm total length 50 cm infusion length multi side-hole infusion catheter 9.  Initiation of venous thrombolysis  Interventional Radiologist:  Sterling Big, MD  Sedation: Moderate (conscious) sedation was used.  Four mg Versed, 100 mcg Fentanyl were administered intravenously.  The patient's vital signs were monitored continuously by radiology nursing throughout the procedure.  Sedation Time: 60 minutes  Fluoroscopy time: 15 minutes 6 seconds  Contrast volume: 10 ml Omnipaque-300  Intravenous medications:  A total of 12 mg TPA was administered into the thrombus  PROCEDURE/FINDINGS:   Informed consent was obtained from the patient following explanation of the procedure, risks, benefits and alternatives. The patient understands, agrees and consents for the procedure. All questions were addressed. A time out was performed.  Maximal barrier sterile technique utilized including caps, mask, sterile gowns, sterile gloves, large sterile drape, hand hygiene, and betadine skin prep.  The left popliteal fossa was interrogated with ultrasound.  The popliteal vein is expanded and completely thrombosed.  Local anesthesia was attained by infiltration with 1% lidocaine.  Under real time sonographic guidance, the thrombosed vein was punctured with a 21-gauge micropuncture needle.  Images obtained stored for the medical record.  The transitional micro sheath was upsized to a 6-French working vascular sheath over a short Amplatz wire.  Using a stiff Glidewire and angled catheter, the catheter was used to navigate into the infrarenal IVC just beyond the nose cone of the IVC filter.  An inferior vena cava gram was performed limiting the amount of intravenous contrast used.  The left renal  vein and inferior vena cava are patent.  Inflow from the right renal vein is also noted indicating patency.  The wire was advanced into the suprarenal IVC.  The 6-French 90 cm length AngioJet device was then used to power pulse spray a total of 6 mg of TPA and 125 ml of saline from the popliteal vein throughout the left lower extremity and into the IVC filter.  A 90 cm total length, 50 cm infusion length Unifuse infusion catheter was then positioned over the wire so that the proximal side hole was just within the filter, and the more proximal side hole in the mid thigh.  The sheath and catheter were secured to the skin with post silk suture.  The right popliteal fossa was interrogated with ultrasound.  Again, the popliteal vein was found be expanded completely thrombosed. Local anesthesia was attained by infiltration with 1% lidocaine. Under real time sonographic guidance, the vein was punctured using a 21-gauge micropuncture needle.  Images obtained stored for the medical record.  A 6-French working vascular sheath was then placed over a stiff Amplatz wire after transitioning from the transitional micro sheath.  The wire was advanced into the suprarenal IVC.  Care was taken to navigate the wire through the right aspect of the IVC and the filter to maximize lytic dispersal.  The 6-French AngioJet was then used to power pulse  spray a total of 6 mg of TPA and 125 ml of saline from the popliteal artery into the IVC.  A 130 cm total length 50 cm infusion length Unifuse multi side-hole infusion catheter was then placed in identical position to the contralateral side.  Initial venous lysis was initiated at 0.25 mg connector place through each catheter for a total of 0.5 mg per hour.  Overall, the patient tolerated the procedure very well.  There was no immediate complication.  IMPRESSION:  1.  Inferior venacavagram demonstrates patency of the renal veins and IVC above the level of the filter.  2. Initial limited bilateral  lower extremity pharmacomechanical thrombolysis.  3.  Initiation of bilateral lower extremity venous lysis using bilateral multi side-hole infusion catheters.  The patient will lyse for 12-24 hours and return interventional radiology for venogram and potential adjunctive interventions tomorrow.  Signed,  Sterling Big, MD [facility]iology   Original Report Authenticated By: Malachy Moan, M.D.   Ir Caffie Damme Ivc  05/11/2013   *RADIOLOGY REPORT*  PRIOR ULTRASOUND GUIDED VASCULAR ACCESS; IR INFERIOR VENA CAVA GRAM; IR THROMBOLYSIS/THROMBECTOMY BILATERAL LOWER EXTREMITIES; IR INITIATION OF VENOUS LYSIS INITIAL DAY  Date: 05/11/2013  Clinical History: 67 year old male with complicated medical history.  He had a TURP and left double-J ureteral stent placement procedure done in the end of August which was complicated by postoperative PE and subsequent postoperative hemorrhage following initiation of anticoagulation.  Hemostasis was obtained and IVC filter was placed.  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  He is now nearly 4 weeks post operative and showing in significant improvement with systemic heparinization.  He presents for attempted pharmacomechanical catheter directed thrombolysis/thrombectomy.  Procedures Performed: 1. Ultrasound-guided puncture of the left popliteal vein 2.  Catheterization of the inferior vena cava above the IVC filter with vena cava gram 3.  Power pulse spray infusion of 6 mg of TPA from the popliteal access to the infrarenal IVC 4.  Placement of a 90 cm total length 50 cm infusion length multi side-hole infusion catheter 5.  Ultrasound-guided puncture of the right popliteal vein 6.  Catheterization of the inferior vena cava above the IVC filter 7.  Power pulse spray infusion of 6 mg of  TPA from the right popliteal access into the infrarenal IVC 8.  Placement of a 130 cm total length 50 cm infusion length multi side-hole infusion catheter 9.  Initiation of venous thrombolysis  Interventional Radiologist:  Sterling Big, MD  Sedation: Moderate (conscious) sedation was used.  Four mg Versed, 100 mcg Fentanyl were administered intravenously.  The patient's vital signs were monitored continuously by radiology nursing throughout the procedure.  Sedation Time: 60 minutes  Fluoroscopy time: 15 minutes 6 seconds  Contrast volume: 10 ml Omnipaque-300  Intravenous medications:  A total of 12 mg TPA was administered into the thrombus  PROCEDURE/FINDINGS:   Informed consent was obtained from the patient following explanation of the procedure, risks, benefits and alternatives. The patient understands, agrees and consents for the procedure. All questions were addressed. A time out was performed.  Maximal barrier sterile technique utilized including caps, mask, sterile gowns, sterile gloves, large sterile drape, hand hygiene, and betadine skin prep.  The left popliteal fossa was interrogated with ultrasound.  The popliteal vein is expanded and completely thrombosed.  Local anesthesia was attained by infiltration with 1% lidocaine.  Under  real time sonographic guidance, the thrombosed vein was punctured with a 21-gauge micropuncture needle.  Images obtained stored for the medical record.  The transitional micro sheath was upsized to a 6-French working vascular sheath over a short Amplatz wire.  Using a stiff Glidewire and angled catheter, the catheter was used to navigate into the infrarenal IVC just beyond the nose cone of the IVC filter.  An inferior vena cava gram was performed limiting the amount of intravenous contrast used.  The left renal vein and inferior vena cava are patent.  Inflow from the right renal vein is also noted indicating patency.  The wire was advanced into the suprarenal IVC.  The  6-French 90 cm length AngioJet device was then used to power pulse spray a total of 6 mg of TPA and 125 ml of saline from the popliteal vein throughout the left lower extremity and into the IVC filter.  A 90 cm total length, 50 cm infusion length Unifuse infusion catheter was then positioned over the wire so that the proximal side hole was just within the filter, and the more proximal side hole in the mid thigh.  The sheath and catheter were secured to the skin with post silk suture.  The right popliteal fossa was interrogated with ultrasound.  Again, the popliteal vein was found be expanded completely thrombosed. Local anesthesia was attained by infiltration with 1% lidocaine. Under real time sonographic guidance, the vein was punctured using a 21-gauge micropuncture needle.  Images obtained stored for the medical record.  A 6-French working vascular sheath was then placed over a stiff Amplatz wire after transitioning from the transitional micro sheath.  The wire was advanced into the suprarenal IVC.  Care was taken to navigate the wire through the right aspect of the IVC and the filter to maximize lytic dispersal.  The 6-French AngioJet was then used to power pulse spray a total of 6 mg of TPA and 125 ml of saline from the popliteal artery into the IVC.  A 130 cm total length 50 cm infusion length Unifuse multi side-hole infusion catheter was then placed in identical position to the contralateral side.  Initial venous lysis was initiated at 0.25 mg connector place through each catheter for a total of 0.5 mg per hour.  Overall, the patient tolerated the procedure very well.  There was no immediate complication.  IMPRESSION:  1.  Inferior venacavagram demonstrates patency of the renal veins and IVC above the level of the filter.  2. Initial limited bilateral lower extremity pharmacomechanical thrombolysis.  3.  Initiation of bilateral lower extremity venous lysis using bilateral multi side-hole infusion catheters.   The patient will lyse for 12-24 hours and return interventional radiology for venogram and potential adjunctive interventions tomorrow.  Signed,  Sterling Big, MD [facility] Radiology   Original Report Authenticated By: Malachy Moan, M.D.   US Renal  04/30/2013   CLINICAL DATA:  Acute renal failure.  EXAM: RENAL/URINARY TRACT ULTRASOUND COMPLETE  COMPARISON:  04/18/2013  FINDINGS: Right Kidney  Length: 10.7 cm. Mild right hydronephrosis. Stent is visualized within the dilated renal pelvis. 1.4 cm cyst.  Left Kidney  Length: 14.0 cm. Slight pelvicaliectasis. Stent cannot be visualized.  7.1 cm cystic mass in the upper pole of the left kidney. This likely represents a benign cyst and is stable since prior study.  Bladder:  Cannot visualize.  IMPRESSION: Mild right hydronephrosis and slight pelvicaliectasis on the left. These findings have improved since prior  study.  Bilateral renal cysts.   Electronically Signed   By: Charlett Nose M.D.   On: 04/30/2013 16:58   Ir Thrombect Veno Mech Mod Sed  05/11/2013   *RADIOLOGY REPORT*  PRIOR ULTRASOUND GUIDED VASCULAR ACCESS; IR INFERIOR VENA CAVA GRAM; IR THROMBOLYSIS/THROMBECTOMY BILATERAL LOWER EXTREMITIES; IR INITIATION OF VENOUS LYSIS INITIAL DAY  Date: 05/11/2013  Clinical History: 67 year old male with complicated medical history.  He had a TURP and left double-J ureteral stent placement procedure done in the end of August which was complicated by postoperative PE and subsequent postoperative hemorrhage following initiation of anticoagulation.  Hemostasis was obtained and IVC filter was placed.  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  He is now nearly 4 weeks post operative and showing in significant improvement with systemic heparinization.  He presents for attempted  pharmacomechanical catheter directed thrombolysis/thrombectomy.  Procedures Performed: 1. Ultrasound-guided puncture of the left popliteal vein 2.  Catheterization of the inferior vena cava above the IVC filter with vena cava gram 3.  Power pulse spray infusion of 6 mg of TPA from the popliteal access to the infrarenal IVC 4.  Placement of a 90 cm total length 50 cm infusion length multi side-hole infusion catheter 5.  Ultrasound-guided puncture of the right popliteal vein 6.  Catheterization of the inferior vena cava above the IVC filter 7.  Power pulse spray infusion of 6 mg of TPA from the right popliteal access into the infrarenal IVC 8.  Placement of a 130 cm total length 50 cm infusion length multi side-hole infusion catheter 9.  Initiation of venous thrombolysis  Interventional Radiologist:  Sterling Big, MD  Sedation: Moderate (conscious) sedation was used.  Four mg Versed, 100 mcg Fentanyl were administered intravenously.  The patient's vital signs were monitored continuously by radiology nursing throughout the procedure.  Sedation Time: 60 minutes  Fluoroscopy time: 15 minutes 6 seconds  Contrast volume: 10 ml Omnipaque-300  Intravenous medications:  A total of 12 mg TPA was administered into the thrombus  PROCEDURE/FINDINGS:   Informed consent was obtained from the patient following explanation of the procedure, risks, benefits and alternatives. The patient understands, agrees and consents for the procedure. All questions were addressed. A time out was performed.  Maximal barrier sterile technique utilized including caps, mask, sterile gowns, sterile gloves, large sterile drape, hand hygiene, and betadine skin prep.  The left popliteal fossa was interrogated with ultrasound.  The popliteal vein is expanded and completely thrombosed.  Local anesthesia was attained by infiltration with 1% lidocaine.  Under real time sonographic guidance, the thrombosed vein was punctured with a 21-gauge micropuncture  needle.  Images obtained stored for the medical record.  The transitional micro sheath was upsized to a 6-French working vascular sheath over a short Amplatz wire.  Using a stiff Glidewire and angled catheter, the catheter was used to navigate into the infrarenal IVC just beyond the nose cone of the IVC filter.  An inferior vena cava gram was performed limiting the amount of intravenous contrast used.  The left renal vein and inferior vena cava are patent.  Inflow from the right renal vein is also noted indicating patency.  The wire was advanced into the suprarenal IVC.  The 6-French 90 cm length AngioJet device was then used to power pulse spray a total of 6 mg of TPA and 125 ml of saline from the popliteal vein throughout  the left lower extremity and into the IVC filter.  A 90 cm total length, 50 cm infusion length Unifuse infusion catheter was then positioned over the wire so that the proximal side hole was just within the filter, and the more proximal side hole in the mid thigh.  The sheath and catheter were secured to the skin with post silk suture.  The right popliteal fossa was interrogated with ultrasound.  Again, the popliteal vein was found be expanded completely thrombosed. Local anesthesia was attained by infiltration with 1% lidocaine. Under real time sonographic guidance, the vein was punctured using a 21-gauge micropuncture needle.  Images obtained stored for the medical record.  A 6-French working vascular sheath was then placed over a stiff Amplatz wire after transitioning from the transitional micro sheath.  The wire was advanced into the suprarenal IVC.  Care was taken to navigate the wire through the right aspect of the IVC and the filter to maximize lytic dispersal.  The 6-French AngioJet was then used to power pulse spray a total of 6 mg of TPA and 125 ml of saline from the popliteal artery into the IVC.  A 130 cm total length 50 cm infusion length Unifuse multi side-hole infusion catheter was  then placed in identical position to the contralateral side.  Initial venous lysis was initiated at 0.25 mg connector place through each catheter for a total of 0.5 mg per hour.  Overall, the patient tolerated the procedure very well.  There was no immediate complication.  IMPRESSION:  1.  Inferior venacavagram demonstrates patency of the renal veins and IVC above the level of the filter.  2. Initial limited bilateral lower extremity pharmacomechanical thrombolysis.  3.  Initiation of bilateral lower extremity venous lysis using bilateral multi side-hole infusion catheters.  The patient will lyse for 12-24 hours and return interventional radiology for venogram and potential adjunctive interventions tomorrow.  Signed,  Sterling Big, MD [facility] Radiology   Original Report Authenticated By: Malachy Moan, M.D.   Ir Thrombect Veno Mech Mod Sed  05/11/2013   *RADIOLOGY REPORT*  PRIOR ULTRASOUND GUIDED VASCULAR ACCESS; IR INFERIOR VENA CAVA GRAM; IR THROMBOLYSIS/THROMBECTOMY BILATERAL LOWER EXTREMITIES; IR INITIATION OF VENOUS LYSIS INITIAL DAY  Date: 05/11/2013  Clinical History: 67 year old male with complicated medical history.  He had a TURP and left double-J ureteral stent placement procedure done in the end of August which was complicated by postoperative PE and subsequent postoperative hemorrhage following initiation of anticoagulation.  Hemostasis was obtained and IVC filter was placed.  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  He is now nearly 4 weeks post operative and showing in significant improvement with systemic heparinization.  He presents for attempted pharmacomechanical catheter directed thrombolysis/thrombectomy.  Procedures Performed: 1. Ultrasound-guided puncture of the left popliteal vein 2.   Catheterization of the inferior vena cava above the IVC filter with vena cava gram 3.  Power pulse spray infusion of 6 mg of TPA from the popliteal access to the infrarenal IVC 4.  Placement of a 90 cm total length 50 cm infusion length multi side-hole infusion catheter 5.  Ultrasound-guided puncture of the right popliteal vein 6.  Catheterization of the inferior vena cava above the IVC filter 7.  Power pulse spray infusion of 6 mg of TPA from the right popliteal access into the infrarenal IVC 8.  Placement of a 130  cm total length 50 cm infusion length multi side-hole infusion catheter 9.  Initiation of venous thrombolysis  Interventional Radiologist:  Sterling Big, MD  Sedation: Moderate (conscious) sedation was used.  Four mg Versed, 100 mcg Fentanyl were administered intravenously.  The patient's vital signs were monitored continuously by radiology nursing throughout the procedure.  Sedation Time: 60 minutes  Fluoroscopy time: 15 minutes 6 seconds  Contrast volume: 10 ml Omnipaque-300  Intravenous medications:  A total of 12 mg TPA was administered into the thrombus  PROCEDURE/FINDINGS:   Informed consent was obtained from the patient following explanation of the procedure, risks, benefits and alternatives. The patient understands, agrees and consents for the procedure. All questions were addressed. A time out was performed.  Maximal barrier sterile technique utilized including caps, mask, sterile gowns, sterile gloves, large sterile drape, hand hygiene, and betadine skin prep.  The left popliteal fossa was interrogated with ultrasound.  The popliteal vein is expanded and completely thrombosed.  Local anesthesia was attained by infiltration with 1% lidocaine.  Under real time sonographic guidance, the thrombosed vein was punctured with a 21-gauge micropuncture needle.  Images obtained stored for the medical record.  The transitional micro sheath was upsized to a 6-French working vascular sheath over a  short Amplatz wire.  Using a stiff Glidewire and angled catheter, the catheter was used to navigate into the infrarenal IVC just beyond the nose cone of the IVC filter.  An inferior vena cava gram was performed limiting the amount of intravenous contrast used.  The left renal vein and inferior vena cava are patent.  Inflow from the right renal vein is also noted indicating patency.  The wire was advanced into the suprarenal IVC.  The 6-French 90 cm length AngioJet device was then used to power pulse spray a total of 6 mg of TPA and 125 ml of saline from the popliteal vein throughout the left lower extremity and into the IVC filter.  A 90 cm total length, 50 cm infusion length Unifuse infusion catheter was then positioned over the wire so that the proximal side hole was just within the filter, and the more proximal side hole in the mid thigh.  The sheath and catheter were secured to the skin with post silk suture.  The right popliteal fossa was interrogated with ultrasound.  Again, the popliteal vein was found be expanded completely thrombosed. Local anesthesia was attained by infiltration with 1% lidocaine. Under real time sonographic guidance, the vein was punctured using a 21-gauge micropuncture needle.  Images obtained stored for the medical record.  A 6-French working vascular sheath was then placed over a stiff Amplatz wire after transitioning from the transitional micro sheath.  The wire was advanced into the suprarenal IVC.  Care was taken to navigate the wire through the right aspect of the IVC and the filter to maximize lytic dispersal.  The 6-French AngioJet was then used to power pulse spray a total of 6 mg of TPA and 125 ml of saline from the popliteal artery into the IVC.  A 130 cm total length 50 cm infusion length Unifuse multi side-hole infusion catheter was then placed in identical position to the contralateral side.  Initial venous lysis was initiated at 0.25 mg connector place through each  catheter for a total of 0.5 mg per hour.  Overall, the patient tolerated the procedure very well.  There was no immediate complication.  IMPRESSION:  1.  Inferior venacavagram demonstrates patency of the renal veins and IVC  above the level of the filter.  2. Initial limited bilateral lower extremity pharmacomechanical thrombolysis.  3.  Initiation of bilateral lower extremity venous lysis using bilateral multi side-hole infusion catheters.  The patient will lyse for 12-24 hours and return interventional radiology for venogram and potential adjunctive interventions tomorrow.  Signed,  Sterling Big, MD [facility] Radiology   Original Report Authenticated By: Malachy Moan, M.D.   Ir US Guide Vasc Access Left  05/11/2013   *RADIOLOGY REPORT*  PRIOR ULTRASOUND GUIDED VASCULAR ACCESS; IR INFERIOR VENA CAVA GRAM; IR THROMBOLYSIS/THROMBECTOMY BILATERAL LOWER EXTREMITIES; IR INITIATION OF VENOUS LYSIS INITIAL DAY  Date: 05/11/2013  Clinical History: 67 year old male with complicated medical history.  He had a TURP and left double-J ureteral stent placement procedure done in the end of August which was complicated by postoperative PE and subsequent postoperative hemorrhage following initiation of anticoagulation.  Hemostasis was obtained and IVC filter was placed.  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  He is now nearly 4 weeks post operative and showing in significant improvement with systemic heparinization.  He presents for attempted pharmacomechanical catheter directed thrombolysis/thrombectomy.  Procedures Performed: 1. Ultrasound-guided puncture of the left popliteal vein 2.  Catheterization of the inferior vena cava above the IVC filter with vena cava gram 3.  Power pulse spray infusion of 6 mg of TPA from the popliteal  access to the infrarenal IVC 4.  Placement of a 90 cm total length 50 cm infusion length multi side-hole infusion catheter 5.  Ultrasound-guided puncture of the right popliteal vein 6.  Catheterization of the inferior vena cava above the IVC filter 7.  Power pulse spray infusion of 6 mg of TPA from the right popliteal access into the infrarenal IVC 8.  Placement of a 130 cm total length 50 cm infusion length multi side-hole infusion catheter 9.  Initiation of venous thrombolysis  Interventional Radiologist:  Sterling Big, MD  Sedation: Moderate (conscious) sedation was used.  Four mg Versed, 100 mcg Fentanyl were administered intravenously.  The patient's vital signs were monitored continuously by radiology nursing throughout the procedure.  Sedation Time: 60 minutes  Fluoroscopy time: 15 minutes 6 seconds  Contrast volume: 10 ml Omnipaque-300  Intravenous medications:  A total of 12 mg TPA was administered into the thrombus  PROCEDURE/FINDINGS:   Informed consent was obtained from the patient following explanation of the procedure, risks, benefits and alternatives. The patient understands, agrees and consents for the procedure. All questions were addressed. A time out was performed.  Maximal barrier sterile technique utilized including caps, mask, sterile gowns, sterile gloves, large sterile drape, hand hygiene, and betadine skin prep.  The left popliteal fossa was interrogated with ultrasound.  The popliteal vein is expanded and completely thrombosed.  Local anesthesia was attained by infiltration with 1% lidocaine.  Under real time sonographic guidance, the thrombosed vein was punctured with a 21-gauge micropuncture needle.  Images obtained stored for the medical record.  The transitional micro sheath was upsized to a 6-French working vascular sheath over a short Amplatz wire.  Using a stiff Glidewire and angled catheter, the catheter was used to navigate into the infrarenal IVC just beyond the nose cone  of the IVC filter.  An inferior vena cava gram was performed limiting the amount of intravenous contrast used.  The left renal vein and inferior vena cava are patent.  Inflow from the right renal vein is also noted indicating patency.  The wire was advanced into the suprarenal IVC.  The 6-French 90 cm length AngioJet device was then used to power pulse spray a total of 6 mg of TPA and 125 ml of saline from the popliteal vein throughout the left lower extremity and into the IVC filter.  A 90 cm total length, 50 cm infusion length Unifuse infusion catheter was then positioned over the wire so that the proximal side hole was just within the filter, and the more proximal side hole in the mid thigh.  The sheath and catheter were secured to the skin with post silk suture.  The right popliteal fossa was interrogated with ultrasound.  Again, the popliteal vein was found be expanded completely thrombosed. Local anesthesia was attained by infiltration with 1% lidocaine. Under real time sonographic guidance, the vein was punctured using a 21-gauge micropuncture needle.  Images obtained stored for the medical record.  A 6-French working vascular sheath was then placed over a stiff Amplatz wire after transitioning from the transitional micro sheath.  The wire was advanced into the suprarenal IVC.  Care was taken to navigate the wire through the right aspect of the IVC and the filter to maximize lytic dispersal.  The 6-French AngioJet was then used to power pulse spray a total of 6 mg of TPA and 125 ml of saline from the popliteal artery into the IVC.  A 130 cm total length 50 cm infusion length Unifuse multi side-hole infusion catheter was then placed in identical position to the contralateral side.  Initial venous lysis was initiated at 0.25 mg connector place through each catheter for a total of 0.5 mg per hour.  Overall, the patient tolerated the procedure very well.  There was no immediate complication.  IMPRESSION:  1.   Inferior venacavagram demonstrates patency of the renal veins and IVC above the level of the filter.  2. Initial limited bilateral lower extremity pharmacomechanical thrombolysis.  3.  Initiation of bilateral lower extremity venous lysis using bilateral multi side-hole infusion catheters.  The patient will lyse for 12-24 hours and return interventional radiology for venogram and potential adjunctive interventions tomorrow.  Signed,  Sterling Big, MD [facility] Radiology   Original Report Authenticated By: Malachy Moan, M.D.   Ir US Guide Vasc Access Right  05/11/2013   *RADIOLOGY REPORT*  PRIOR ULTRASOUND GUIDED VASCULAR ACCESS; IR INFERIOR VENA CAVA GRAM; IR THROMBOLYSIS/THROMBECTOMY BILATERAL LOWER EXTREMITIES; IR INITIATION OF VENOUS LYSIS INITIAL DAY  Date: 05/11/2013  Clinical History: 67 year old male with complicated medical history.  He had a TURP and left double-J ureteral stent placement procedure done in the end of August which was complicated by postoperative PE and subsequent postoperative hemorrhage following initiation of anticoagulation.  Hemostasis was obtained and IVC filter was placed.  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  He is now nearly 4 weeks post operative and showing in significant improvement with systemic heparinization.  He presents for attempted pharmacomechanical catheter directed thrombolysis/thrombectomy.  Procedures Performed: 1. Ultrasound-guided puncture of the left popliteal vein 2.  Catheterization of the inferior vena cava above the IVC filter with vena cava gram 3.  Power pulse spray infusion of 6 mg of TPA from the popliteal access to the infrarenal IVC 4.  Placement of a 90 cm total length 50 cm infusion length  multi side-hole infusion catheter 5.  Ultrasound-guided  puncture of the right popliteal vein 6.  Catheterization of the inferior vena cava above the IVC filter 7.  Power pulse spray infusion of 6 mg of TPA from the right popliteal access into the infrarenal IVC 8.  Placement of a 130 cm total length 50 cm infusion length multi side-hole infusion catheter 9.  Initiation of venous thrombolysis  Interventional Radiologist:  Sterling Big, MD  Sedation: Moderate (conscious) sedation was used.  Four mg Versed, 100 mcg Fentanyl were administered intravenously.  The patient's vital signs were monitored continuously by radiology nursing throughout the procedure.  Sedation Time: 60 minutes  Fluoroscopy time: 15 minutes 6 seconds  Contrast volume: 10 ml Omnipaque-300  Intravenous medications:  A total of 12 mg TPA was administered into the thrombus  PROCEDURE/FINDINGS:   Informed consent was obtained from the patient following explanation of the procedure, risks, benefits and alternatives. The patient understands, agrees and consents for the procedure. All questions were addressed. A time out was performed.  Maximal barrier sterile technique utilized including caps, mask, sterile gowns, sterile gloves, large sterile drape, hand hygiene, and betadine skin prep.  The left popliteal fossa was interrogated with ultrasound.  The popliteal vein is expanded and completely thrombosed.  Local anesthesia was attained by infiltration with 1% lidocaine.  Under real time sonographic guidance, the thrombosed vein was punctured with a 21-gauge micropuncture needle.  Images obtained stored for the medical record.  The transitional micro sheath was upsized to a 6-French working vascular sheath over a short Amplatz wire.  Using a stiff Glidewire and angled catheter, the catheter was used to navigate into the infrarenal IVC just beyond the nose cone of the IVC filter.  An inferior vena cava gram was performed limiting the amount of intravenous contrast used.  The left renal vein and inferior  vena cava are patent.  Inflow from the right renal vein is also noted indicating patency.  The wire was advanced into the suprarenal IVC.  The 6-French 90 cm length AngioJet device was then used to power pulse spray a total of 6 mg of TPA and 125 ml of saline from the popliteal vein throughout the left lower extremity and into the IVC filter.  A 90 cm total length, 50 cm infusion length Unifuse infusion catheter was then positioned over the wire so that the proximal side hole was just within the filter, and the more proximal side hole in the mid thigh.  The sheath and catheter were secured to the skin with post silk suture.  The right popliteal fossa was interrogated with ultrasound.  Again, the popliteal vein was found be expanded completely thrombosed. Local anesthesia was attained by infiltration with 1% lidocaine. Under real time sonographic guidance, the vein was punctured using a 21-gauge micropuncture needle.  Images obtained stored for the medical record.  A 6-French working vascular sheath was then placed over a stiff Amplatz wire after transitioning from the transitional micro sheath.  The wire was advanced into the suprarenal IVC.  Care was taken to navigate the wire through the right aspect of the IVC and the filter to maximize lytic dispersal.  The 6-French AngioJet was then used to power pulse spray a total of 6 mg of TPA and 125 ml of saline from the popliteal artery into the IVC.  A 130 cm total length 50 cm infusion length Unifuse multi side-hole infusion catheter was then placed in identical position to the contralateral side.  Initial venous lysis was initiated at 0.25 mg connector place through each catheter for a total of 0.5 mg per hour.  Overall, the patient tolerated the procedure very well.  There was no immediate complication.  IMPRESSION:  1.  Inferior venacavagram demonstrates patency of the renal veins and IVC above the level of the filter.  2. Initial limited bilateral lower extremity  pharmacomechanical thrombolysis.  3.  Initiation of bilateral lower extremity venous lysis using bilateral multi side-hole infusion catheters.  The patient will lyse for 12-24 hours and return interventional radiology for venogram and potential adjunctive interventions tomorrow.  Signed,  Sterling Big, MD [facility] Radiology   Original Report Authenticated By: Malachy Moan, M.D.   Dg Chest Port 1 View  05/13/2013   CLINICAL DATA:  Shortness of breath. Atelectasis.  EXAM: PORTABLE CHEST - 1 VIEW  COMPARISON:  04/29/2013  FINDINGS: Slight improvement in aeration and lung volumes. No confluent opacities currently. Heart is upper limits normal in size. No effusions. No acute bony abnormality.  IMPRESSION: No active disease.   Electronically Signed   By: Charlett Nose M.D.   On: 05/13/2013 05:27   Dg Knee Left Port  05/16/2013   CLINICAL DATA:  Pain and swelling  EXAM: PORTABLE LEFT KNEE - 1-2 VIEW  COMPARISON:  None.  FINDINGS: There is no evidence of fracture or dislocation. Trace suprapatellar fluid identified. There is no focal bone abnormality. Soft tissues are remarkable for mild edema diffusely. Tricompartmental degenerative change noted.  IMPRESSION: Degenerative change without acute osseous abnormality.   Electronically Signed   By: Christiana Pellant M.D.   On: 05/16/2013 21:47   Ct Angio Abd/pel W/ And/or W/o  05/10/2013   *RADIOLOGY REPORT*  Clinical Data: Extensive bilateral DVT, evaluate for caval thrombus.  CT ANGIOGRAPHY ABDOMEN AND PELVIS WITH CONTRAST AND WITHOUT CONTRAST  Technique:  Multidetector CT imaging of the abdomen and pelvis was performed following the standard protocol during bolus administration of intravenous contrast.  Comparison: Prior CT abdomen/pelvis 04/30/2013  Findings:  Lower Chest:  Mild dependent atelectasis versus scarring in the posterior left lower lobe.  The lung bases are otherwise clear. Visualized heart within  normal limits for size.  No pericardial effusion.  Unremarkable distal thoracic esophagus.  Abdomen: Unremarkable CT appearance of the stomach, duodenum, spleen, pancreas and left adrenal gland.  Dystrophic calcification in the right adrenal gland is nonspecific, but stable.  Normal hepatic contours and morphology.  No focal hepatic mass. Gallbladder is unremarkable. No intra or extrahepatic biliary ductal dilatation.  Stable 7.7 cm simple cyst exophytic from the upper pole of the left kidney.  Stable low attenuation left subcapsular fluid collection, likely a liquefied subcapsular hematoma.  No hydronephrosis. Bilateral double J ureteral stents are in place.  There are multiple small parapelvic cysts in the right kidney.  Small exophytic renal cortical cyst on the right.  Symmetric bilateral renal perfusion.  No bowel obstruction or focal bowel wall thickening.  No free fluid or suspicious adenopathy.  Pelvis: The bladder is decompressed.  There is a suprapubic catheter as well as to double-J ureteral stents.  Surgical changes of prior TURP.  No free fluid or suspicious adenopathy.  Bones: No acute fracture or aggressive appearing lytic or blastic osseous lesion.  Vascular: Extensive thrombus beginning in the inferior vena cava contained within the IVC filter and extending through out of the bilateral iliac and femoral venous systems.  Thrombus likely extends into the superficial great saphenous veins  bilaterally as well.  No significant provocation superior to the filter.  The renal veins are patent bilaterally.  Mild scattered atherosclerotic vascular calcifications.  No acute arterial abnormality.  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  No evidence of propagation more centrally within the inferior vena cava.  The bilateral renal veins remain widely patent.  Additional ancillary findings as above without significant interval change.    Original Report Authenticated By: Malachy Moan, M.D.   Ir Infusion Thrombol Venous Initial (ms)  05/11/2013   *RADIOLOGY REPORT*  PRIOR ULTRASOUND GUIDED VASCULAR ACCESS; IR INFERIOR VENA CAVA GRAM; IR THROMBOLYSIS/THROMBECTOMY BILATERAL LOWER EXTREMITIES; IR INITIATION OF VENOUS LYSIS INITIAL DAY  Date: 05/11/2013  Clinical History: 67 year old male with complicated medical history.  He had a TURP and left double-J ureteral stent placement procedure done in the end of August which was complicated by postoperative PE and subsequent postoperative hemorrhage following initiation of anticoagulation.  Hemostasis was obtained and IVC filter was placed.  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  He is now nearly 4 weeks post operative and showing in significant improvement with systemic heparinization.  He presents for attempted pharmacomechanical catheter directed thrombolysis/thrombectomy.  Procedures Performed: 1. Ultrasound-guided puncture of the left popliteal vein 2.  Catheterization of the inferior vena cava above the IVC filter with vena cava gram 3.  Power pulse spray infusion of 6 mg of TPA from the popliteal access to the infrarenal IVC 4.  Placement of a 90 cm total length 50 cm infusion length multi side-hole infusion catheter 5.  Ultrasound-guided puncture of the right popliteal vein 6.  Catheterization of the inferior vena cava above the IVC filter 7.  Power pulse spray infusion of 6 mg of TPA from the right popliteal access into the infrarenal IVC 8.  Placement of a 130 cm total length 50 cm infusion length multi side-hole infusion catheter 9.  Initiation of venous thrombolysis  Interventional Radiologist:  Sterling Big, MD  Sedation: Moderate (conscious) sedation was used.  Four mg Versed, 100 mcg Fentanyl were administered intravenously.  The patient's  vital signs were monitored continuously by radiology nursing throughout the procedure.  Sedation Time: 60 minutes  Fluoroscopy time: 15 minutes 6 seconds  Contrast volume: 10 ml Omnipaque-300  Intravenous medications:  A total of 12 mg TPA was administered into the thrombus  PROCEDURE/FINDINGS:   Informed consent was obtained from the patient following explanation of the procedure, risks, benefits and alternatives. The patient understands, agrees and consents for the procedure. All questions were addressed. A time out was performed.  Maximal barrier sterile technique utilized including caps, mask, sterile gowns, sterile gloves, large sterile drape, hand hygiene, and betadine skin prep.  The left popliteal fossa was interrogated with ultrasound.  The popliteal vein is expanded and completely thrombosed.  Local anesthesia was attained by infiltration with 1% lidocaine.  Under real time sonographic guidance, the thrombosed vein was punctured with a 21-gauge micropuncture needle.  Images obtained stored for the medical record.  The transitional micro sheath was upsized to a 6-French working vascular sheath over a short Amplatz wire.  Using a stiff Glidewire and angled catheter, the catheter was used to navigate into the infrarenal IVC just beyond the nose cone of the IVC filter.  An inferior vena cava gram was performed limiting the amount of intravenous contrast used.  The left renal vein and inferior vena cava are patent.  Inflow from the right renal vein is also noted indicating patency.  The wire was advanced into the suprarenal IVC.  The 6-French 90 cm length AngioJet device was then used to power pulse spray a total of 6 mg of TPA and 125 ml of saline from the popliteal vein throughout the left lower extremity and into the IVC filter.  A 90 cm total length, 50 cm infusion length Unifuse infusion catheter was then positioned over the wire so that the proximal side hole was just within the filter, and the more  proximal side hole in the mid thigh.  The sheath and catheter were secured to the skin with post silk suture.  The right popliteal fossa was interrogated with ultrasound.  Again, the popliteal vein was found be expanded completely thrombosed. Local anesthesia was attained by infiltration with 1% lidocaine. Under real time sonographic guidance, the vein was punctured using a 21-gauge micropuncture needle.  Images obtained stored for the medical record.  A 6-French working vascular sheath was then placed over a stiff Amplatz wire after transitioning from the transitional micro sheath.  The wire was advanced into the suprarenal IVC.  Care was taken to navigate the wire through the right aspect of the IVC and the filter to maximize lytic dispersal.  The 6-French AngioJet was then used to power pulse spray a total of 6 mg of TPA and 125 ml of saline from the popliteal artery into the IVC.  A 130 cm total length 50 cm infusion length Unifuse multi side-hole infusion catheter was then placed in identical position to the contralateral side.  Initial venous lysis was initiated at 0.25 mg connector place through each catheter for a total of 0.5 mg per hour.  Overall, the patient tolerated the procedure very well.  There was no immediate complication.  IMPRESSION:  1.  Inferior venacavagram demonstrates patency of the renal veins and IVC above the level of the filter.  2. Initial limited bilateral lower extremity pharmacomechanical thrombolysis.  3.  Initiation of bilateral lower extremity venous lysis using bilateral multi side-hole infusion catheters.  The patient will lyse for 12-24 hours and return interventional radiology for venogram and potential adjunctive interventions tomorrow.  Signed,  Sterling Big, MD [facility] Radiology   Original Report Authenticated By: Malachy Moan, M.D.   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 Status post initiation of catheter directed venous thrombolytic therapy via bilateral popliteal access yesterday.  EXAM: 1. Followup angiography of bilateral lower extremities and IVC on completion of thrombolytic therapy.  2.  Venous angioplasty of right external and common iliac veins  FLUOROSCOPY TIME:  FLUOROSCOPY TIME  3 min and 12 seconds.  PROCEDURE: In a prone position, both pre-existing popliteal venous sheaths and infusion catheters were prepped with Betadine. Venography was performed of both lower extremities with injection of bilateral popliteal sheathes as well as both indwelling infusion catheters to assess venous patency from the level of the popliteal veins bilaterally through the iliac veins and inferior vena cava, including at the level of the IVC filter.  Right common iliac and external iliac veins were dilated with a 10 mm x 4 cm Conquest balloon. Additional venography was performed at the level of the common femoral veins bilaterally via 5 French catheters. Upon completion of the procedure, IV heparin was turned  off and manual compression was held at both popliteal venous access sites after sheath removal. Thrombolytic therapy was discontinued.  Complications: None  FINDINGS: Venography demonstrates dramatically improved patency of bilateral popliteal and femoral veins in both lower extremities with antegrade flow reestablished and no significant residual thrombus present. A minimal amount of adherent mural thrombus was present in the right femoral vein. Left-sided iliac veins showed good patency and flow with no significant narrowing identified.  Right-sided iliac veins showed less rapid antegrade flow with some mural thrombus and stenosis present at the level of the common and external iliac veins. Iliac venous patency improved significantly after 10 mm balloon angioplasty with excellent antegrade flow present.  Visualized segment of the  inferior vena cava shows no significant residual thrombus with flow present, including at the level of the indwelling IVC filter. There may be a minimal amount of residual mural thrombus remaining in the lower IVC which does not appear to be impeding flow.  IMPRESSION: Dramatic improvement in patency of bilateral lower extremity deep veins, iliac veins and the IVC. There was some sluggish flow associated with an area of stenosis of the right external and common iliac veins. This segment was treated with 10 mm balloon angioplasty with improved result.  PREPERATION AND PROCEDURE MEDICATIONS: 4.0 mg IV Versed and and 200 mcg IV fentanyl.  Sedation time: 45 min   Electronically Signed   By: Irish Lack   On: 05/12/2013 15:57    CBC  Recent Labs Lab 05/23/13 0555 05/24/13 0540 05/24/13 1659 05/25/13 0550 05/26/13 0736 05/27/13 0615  WBC 6.0 5.2  --  6.5 5.3 4.8  HGB 8.3* 7.4* 8.0* 7.3* 7.4* 7.6*  HCT 25.5* 22.9* 24.7* 22.1* 23.6* 24.2*  PLT 274 272  --  263 277 286  MCV 88.2 87.4  --  87.7 91.1 91.0  MCH 28.7 28.2  --  29.0 28.6 28.6  MCHC 32.5 32.3  --  33.0 31.4 31.4  RDW 16.1* 16.2*  --  16.0* 16.8* 17.1*    Chemistries   Recent Labs Lab 05/21/13 0615 05/24/13 0540 05/25/13 0550 05/27/13 0615  NA 131* 136 134* 137  K 4.4 4.4 4.4 4.3  CL 102 105 104 106  CO2 23 26 24 26   GLUCOSE 103* 113* 129* 98  BUN 17 15 17 14   CREATININE 1.13 1.10 1.12 0.98  CALCIUM 7.4* 7.5* 8.0* 7.8*   ------------------------------------------------------------------------------------------------------------------ estimated creatinine clearance is 96.4 ml/min (by C-G formula based on Cr of 0.98). ------------------------------------------------------------------------------------------------------------------ No results found for this basename: HGBA1C,  in the last 72 hours ------------------------------------------------------------------------------------------------------------------ No results  found for this basename: CHOL, HDL, LDLCALC, TRIG, CHOLHDL, LDLDIRECT,  in the last 72 hours ------------------------------------------------------------------------------------------------------------------ No results found for this basename: TSH, T4TOTAL, FREET3, T3FREE, THYROIDAB,  in the last 72 hours ------------------------------------------------------------------------------------------------------------------ No results found for this basename: VITAMINB12, FOLATE, FERRITIN, TIBC, IRON, RETICCTPCT,  in the last 72 hours  Coagulation profile No results found for this basename: INR, PROTIME,  in the last 168 hours  No results found for this basename: DDIMER,  in the last 72 hours  Cardiac Enzymes No results found for this basename: CK, CKMB, TROPONINI, MYOGLOBIN,  in the last 168 hours ------------------------------------------------------------------------------------------------------------------ No components found with this basename: POCBNP,

## 2013-05-27 NOTE — Progress Notes (Addendum)
ANTICOAGULATION CONSULT NOTE - Initial Consult (restart)  Pharmacy Consult for Xarelot  Indication: DVT  Allergies  Allergen Reactions  . Contrast Media [Iodinated Diagnostic Agents]     On 04/29/2013 spoke with patient he had some type of procedure at [facility] and broke out in hives after the exams in 1997, he did not know what of type of study he had.  I called [facility] Radiology department the only type of contrast they used during that time  was Conray   Patient Measurements: Height: 5\' 9"  (175.3 cm) Weight: 279 lb 14.4 oz (126.962 kg) IBW/kg (Calculated) : 70.7   Vital Signs: Temp: 97 F (36.1 C) (10/14 0539) Temp src: Oral (10/14 0539) BP: 109/65 mmHg (10/14 0539) Pulse Rate: 85 (10/14 0539)  Labs:  Recent Labs  05/25/13 0550 05/26/13 0736 05/27/13 0615  HGB 7.3* 7.4* 7.6*  HCT 22.1* 23.6* 24.2*  PLT 263 277 286  CREATININE 1.12  --  0.98    Estimated Creatinine Clearance: 96.4 ml/min (by C-G formula based on Cr of 0.98).    Assessment: 66yom with history of PE s/p IVC filter who developed extensive bilateral DVTs on 9/21 with concern for clot moving through the IVC filter. He underwent thrombolytic therapy with [facility] 9/28-9/29.  On 04/13/13 s/p transurethral resection of prostat with left ureteroscopic stone manipulation complicated by perinephric hematoma with bilateral hydronephrosis. On 10/1 there was concern of developing compartment syndrome in his right leg so he went to the OR for right leg fasciotomy. BLE compartment syndrome due to bleeding/hematoma. Transfused PRBCs 05/20/13.  S/p fasciotomy closure (10/8).  IV heparin 9/22 >>05/23/13 then switched to oral Xarelto.  He received 1 day of Xarelto 15 mg BID (2doses given) when RN reported blood in urine. Thus Xarelto was stopped 05/23/13, monitored for bleeding.   No further bleeding.  Hgb increasing. Hgb 7.6 today-stable. PLTC 286.   Dr. Catha Gosselin has discussed anticoagulation plans with [facility]. [facility]  agrees okay to restart anticoagulation.  Plan is to restart on Xarelto today and probable discharge to SNF tomorrow. ARF resolved, SCr 0.98, CrCl ~ 96 ml/min   Goal of Therapy:  Monitor platelets by anticoagulation protocol: Yes   Plan:   Restart Xarelto 15mg  bid for 21 days then 20mg  po daily (20mg  starts 06/17/13). Patient previously educated about Xarelto on 05/23/13. Monitor closely for bleeding.   Noah Delaine, RPh Clinical Pharmacist Pager: [phone] 05/27/2013 8:53 AM

## 2013-05-27 NOTE — Progress Notes (Signed)
Text page Dr. Catha Gosselin to inform patient urine has turned pinkish in color.  No other active sign of bleeding.  New orders received.

## 2013-05-27 NOTE — Progress Notes (Signed)
Occupational Therapy Treatment Patient Details Name: Jack [name] MRN: [id number] DOB: 08-09-1946 Today's Date: 05/27/2013 Time: 4098-1191 OT Time Calculation (min): 27 min  OT Assessment / Plan / Recommendation  History of present illness 33 M underwent TURP and L ureteral stent 8/29. Admitted to [facility] 9/02 with acute PE and anticoagulation initiated. Hospitalization c/b hematuria, progressive renal linsufficiency and anemia requiring transfusion. Bladder irrigations initiated and CT scan abdomen performed 9/03 revealed L renal fluid subcapsular fluid collection thought to represent hematoma. Transferred to [facility] 9/05 for impending need for HD and placement of IVC filter. Currently has a suprapubic catheter with CBI.  Pt with bil LE fasciotomies 10/1 and 10/4 with RLE closed 10/6 and LLE closed 10/8. Pt in bed 10/1-10/9   OT comments  Pt progressing this session with OT demonstrating increased bed mobility, sit<>Stand and ability to take two side steps toward HOB. Pt very motivated and working hard toward goals.  Follow Up Recommendations  Supervision/Assistance - 24 hour;CIR (CIR denied at this this time/ recommend SNF due to denial)    Barriers to Discharge       Equipment Recommendations  3 in 1 bedside comode;Wheelchair (measurements OT);Wheelchair cushion (measurements OT)    Recommendations for Other Services    Frequency Min 2X/week   Progress towards OT Goals Progress towards OT goals: Progressing toward goals  Plan Discharge plan remains appropriate    Precautions / Restrictions Precautions Precautions: Fall Precaution Comments: abdominal drain into foley   Pertinent Vitals/Pain PT Requesting RN dress abdominal drain. Rn notified    ADL  Grooming: Wash/dry face;Independent Where Assessed - Grooming: Unsupported sitting Lower Body Dressing: +1 Total assistance Where Assessed - Lower Body Dressing: Supine, head of bed flat Toilet Transfer: Moderate  assistance Toilet Transfer Method: Sit to stand Toilet Transfer Equipment: Raised toilet seat with arms (or 3-in-1 over toilet) Toileting - Clothing Manipulation and Hygiene: +1 Total assistance Where Assessed - Toileting Clothing Manipulation and Hygiene: Sit to stand from 3-in-1 or toilet Equipment Used: Gait belt;Rolling walker;Other (comment) (foley IV ) Transfers/Ambulation Related to ADLs: pt very focused on walking this session. pt wanted to complete sit<>stand. Pt completed sit<>stand x5 this session with bed slightly elevated . Pt completed two side steps toward HOB moving LT LE. ADL Comments: PT very motivated and very excited to take two steps this session side ways toward [facility]. pt needs cues for controlling breathing for standing. pt able to static stand for ~45 seconds then returning to bed surface. Pt regaining breath control and attempting sit<>stand again. Pt demonstrates ability to lift bil LE off floor with weight shift this session. Pt completed bed mobilty with rail. Pt with greater edema in Rt LE than Lt LE    OT Diagnosis:    OT Problem List:   OT Treatment Interventions:     OT Goals(current goals can now be found in the care plan section) Acute Rehab OT Goals Patient Stated Goal: To get back to walking OT Goal Formulation: With patient Time For Goal Achievement: 06/05/13 Potential to Achieve Goals: Good ADL Goals Pt Will Perform Lower Body Bathing: with min assist;sit to/from stand Pt Will Perform Lower Body Dressing: with min assist;with adaptive equipment;sit to/from stand Pt Will Transfer to Toilet: with min assist;ambulating;regular height toilet;bedside commode;grab bars Pt Will Perform Toileting - Clothing Manipulation and hygiene: with min assist;sit to/from stand Pt/caregiver will Perform Home Exercise Program: Increased strength;Right Upper extremity;Left upper extremity;With theraband  Visit Information  Last OT Received On: 05/27/13 Assistance  Needed: +2  (for ambulation- sit<>stand +1) History of Present Illness: 38 M underwent TURP and L ureteral stent 8/29. Admitted to [facility] 9/02 with acute PE and anticoagulation initiated. Hospitalization c/b hematuria, progressive renal linsufficiency and anemia requiring transfusion. Bladder irrigations initiated and CT scan abdomen performed 9/03 revealed L renal fluid subcapsular fluid collection thought to represent hematoma. Transferred to [facility] 9/05 for impending need for HD and placement of IVC filter. Currently has a suprapubic catheter with CBI.  Pt with bil LE fasciotomies 10/1 and 10/4 with RLE closed 10/6 and LLE closed 10/8. Pt in bed 10/1-10/9    Subjective Data      Prior Functioning       Cognition  Cognition Arousal/Alertness: Awake/alert Behavior During Therapy: WFL for tasks assessed/performed Overall Cognitive Status: Within Functional Limits for tasks assessed    Mobility  Bed Mobility Rolling Left: 4: Min guard;With rail Left Sidelying to Sit: 4: Min assist;With rails;Other (comment) Supine to Sit: 4: Min guard;With rails Sitting - Scoot to Edge of Bed: 4: Min guard;With rail Sit to Supine: 4: Min guard;With rail Details for Bed Mobility Assistance: pt relying heavily on bedrail. Pt able to use bIL LE to progress to EOB Transfers Transfers: Stand to Sit;Sit to Stand Sit to Stand: 3: Mod assist;With upper extremity assist;From elevated surface;From bed Stand to Sit: With upper extremity assist;To bed;To elevated surface;3: Mod assist Details for Transfer Assistance: sit<>stand x5     Exercises  General Exercises - Lower Extremity Hip Flexion/Marching: AROM;10 reps;Both;Strengthening;Seated   Balance Static Sitting Balance Static Sitting - Balance Support: No upper extremity supported;Feet supported Static Sitting - Level of Assistance: 6: Modified independent (Device/Increase time)   End of Session OT - End of Session Activity Tolerance: Patient tolerated treatment  well Patient left: in bed;with call bell/phone within reach;with family/visitor present Nurse Communication: Mobility status;Precautions  GO     Harolyn Rutherford 05/27/2013, 11:33 AM Pager: [phone]

## 2013-05-28 LAB — CBC
MCHC: 31.6 g/dL (ref 30.0–36.0)
Platelets: 279 10*3/uL (ref 150–400)
RDW: 17 % — ABNORMAL HIGH (ref 11.5–15.5)
WBC: 4.5 10*3/uL (ref 4.0–10.5)

## 2013-05-28 LAB — URINE CULTURE: Culture: NO GROWTH

## 2013-05-28 MED ORDER — RIVAROXABAN 20 MG PO TABS: 20.0000 mg | ORAL_TABLET | Freq: Every day | ORAL | Status: DC

## 2013-05-28 MED ORDER — RIVAROXABAN 15 MG PO TABS
15.0000 mg | ORAL_TABLET | Freq: Two times a day (BID) | ORAL | Status: DC
  Administered 2013-05-28 – 2013-05-29 (×3): 15 mg via ORAL
  Filled 2013-05-28 (×4): qty 1

## 2013-05-28 NOTE — Progress Notes (Signed)
Interventional Radiology Progress Note  HPI: 67 yo male with complicated PMH including post operative PE and bleeding complication following anticoagulation necessitating IVC filter placement which was then complicated by ICV and BLE venous thrombosis.  He is now POD#16 s/p catheter directed thrombolysis which was complicated by delayed bleeding into the calf compartments bilaterally (right side on POD#2 and left side on POD#5) requiring bilateral fasciotomies.  His fasciotomies are now closed and he walked several steps with PT.  He has an D/C to rehab.   Subjective: No active complaints.  Jack [name] continues to feel weak but much better then he did several weeks ago.   Legs feel less edematous and heavy.    Objective: Filed Vitals:   05/28/13 0528  BP: 132/61  Pulse: 71  Temp: 97.8 F (36.6 C)  Resp: 19   Ext:  Bilateral fasciotomy bandages.  Thighs significantly less edematous. Pulses: DP palpable and symetric bilaterally  Assessment / Recommendations:  Improving POD#16 s/p bilateral LE and IVC lysis.  Jack [name] will be going to rehab soon.  His most recent CT scan from 10/11 demonstrated patency of his IVC and iliac veins.  He does have some non occlusive thrombus in the extremities on duplex US.   I am encouraged that his filter, IVC and iliac veins remain patent without anticoagulation and that his leg swelling continues to improve.  Given his propensity to bleed with systemic anticoagulation, I would consider rigid use of SCDs throughout the recovery period until he is fully ambulatory again.    Thank you again for this very interesting consult.  Jack [name] had been a pleasure to work with.   Signed,  Sterling Big, MD [facility]

## 2013-05-28 NOTE — Progress Notes (Signed)
PULMONARY  / CRITICAL CARE MEDICINE  Name: Jack [name] MRN: [id number] DOB: October 01, 1945    ADMISSION DATE:  04/30/2013 CONSULTATION DATE:  05/15/2013  REFERRING MD :  Waymon Amato PRIMARY SERVICE: Internal  CHIEF COMPLAINT:  Right LE pain  BRIEF PATIENT DESCRIPTION: 67 y.o. male with complicated medical history including a recent stay at [facility] for PE S/P TURP 8/29  who required subsequent bilateral renal stents for obstruction due to hemorrhagic complications of anticoagulation.  Pt was seen at [facility]  9/2 for V/Q showing likely PE. PT had CT showing L renal hematoma.9/5 - transferred to [facility] to PCCM service - noted to have large clot in bladder w/ B hydronephrosis 9/5 - IVC filter placement. In cone he demonstrated bilateral large clot burden DVT in lower ext and IVC was clotted off.  Decision was made to perform a catheter directed lysis of clot and patient was transferred to the ICU for monitoring, placed on heparin at that time. Following thrombolysis, pt developed compartment syndrome and was taken to the OR on 10/1 for R calf fasciotomy (closed 10/6) and L calf fasciotomy 10/4 (closed 10/8).  Heparin stopped, Xarelto started.  SIGNIFICANT EVENTS / STUDIES:  8/29 - TURP w/ L ureteral stent [facility]  9/02 - admit [facility] w/ V/Q "high probability" for PE >> anticoag >> severe hematuria + anemia + renal failure  9/3 - CT abdom reveals large L renal capsular hematoma  9/5 - transferred to [facility] to PCCM service - noted to have large clot in bladder w/ B hydronephrosis  9/5 - IVC filter placement  9/6 - CVC placed.  B ureteral stents placed - open evacuation of clot from bladder- suprapubic tube placed  9/7- venous duplex lower extremity - NO DVT  9/17 - Renal US - Mild right hydronephrosis. 9/21-venous duplex lower extremity- Bilateral: Positive for DVT in the common femoral, profunda, femoral, popliteal, and posterior tibial veins with superficial thrombus in the greater  and lesser saphenous veins.  9/22 - IV heparin initiated for DVTs (anticoagulation had been on hold due to renal hematoma)  9/27 CTA abd/pelv - IVC thrombus extending from  filter throughout the  visualized bilateral lower extremity veins.  9/28-Bilateral popliteal venous acc with initiation of bilateral LE and IVC thrombolysis, popliteal sheaths 9/29- IR inferior venacavagram, thrombolysis/thrombectomy bilateral lower extremities showing improved patency, IVC patent. Advised d/c lytic therapy, continue heparin per pharmacy  9/30 - CT abd/pelvis showed stable L renal hematoma. Small B pleural effusions 10/1- Acute hematoma rt leg below knee.Fasciotomy performed on RLE for compartment syndrome, then 8 hrs later hep restarted 10/2 - patient waiting for stepdown bed. Continued to be followed by vascular surgery, neg 3 liters with improved renal fxn 10/11 - CT abd/pelvis - decreased L renal hematoma, slightly increasing mild R hydronephrosis.  Small filling defects within right renal calices, could represent clots vs.infetion vs calculi. 10/15- pink urine, HCT unchanged  LINES / TUBES: PIV 9/28 IR placed bilateral sheaths for lytic drip Suprapubic catheter 9/06 >>>  CULTURES: 9/17 Blood>>> Neg 9/28 MRSA>> Neg  Urine 10/14 >>> C.diff 10/3 >>> POS  ANTIBIOTICS: Ancef 10/1, 10/4, 10/8 >>> single doses for Fasciotomies Zosyn 9/17 >>> 9/21 Vanc 9/17 - 9/20  SUBJECTIVE:  Patient states that he is doing "great".  Resting well, pain controlled.  Discomfort in bilateral LE's at fasciotomy sites.  Denies any recent bleeding.  Denies chest pain, SOB, N/V, fevers, chills, sweats.  Tells me that "I want to get to the rehab  place so that I can get up and move around, the quicker I can do that, the better I'll feel".  VITAL SIGNS: Temp:  [97.8 F (36.6 C)-99.1 F (37.3 C)] 98.1 F (36.7 C) (10/15 1000) Pulse Rate:  [71-86] 78 (10/15 1000) Resp:  [18-20] 20 (10/15 1000) BP: (115-132)/(56-61) 115/56  mmHg (10/15 1000) SpO2:  [97 %-100 %] 98 % (10/15 1000) Weight:  [124.603 kg (274 lb 11.2 oz)] 124.603 kg (274 lb 11.2 oz) (10/14 2141)    INTAKE / OUTPUT: Intake/Output     10/14 0701 - 10/15 0700 10/15 0701 - 10/16 0700   P.O. 1200 240   I.V. (mL/kg) 3400 (27.3)    Total Intake(mL/kg) 4600 (36.9) 240 (1.9)   Urine (mL/kg/hr) 4950 (1.7) 650 (1.2)   Stool 1 (0)    Total Output 4951 650   Net -351 -410        Stool Occurrence 3 x      PHYSICAL EXAMINATION: General:  Pleasant male resting in hospital bed, in NAD. Neuro:  A&O x 3, mood and affect appropriate. HEENT:  [place]/AT, PERRL, MMM. Cardiovascular:  RRR, no m/r/g Lungs:  Decreased air movement bilaterally but CTA, no W/R/R. Abdomen:  BS x 4, Soft, NT/ND. Suprapubic catheter in place, clean and dry. Extremities: Dressings to both LE's C/D/I.  Actively moves all 4 extremities. 2+ edema, non pitting.   LABS:  CBC Recent Labs     05/26/13  0736  05/27/13  0615  WBC  5.3  4.8  HGB  7.4*  7.6*  HCT  23.6*  24.2*  PLT  277  286   BMET Recent Labs     05/27/13  0615  NA  137  K  4.3  CL  106  CO2  26  BUN  14  CREATININE  0.98  GLUCOSE  98   Electrolytes Recent Labs     05/27/13  0615  CALCIUM  7.8*   Glucose Recent Labs     05/26/13  1634  GLUCAP  105*    ASSESSMENT / PLAN:  PULMONARY A:  No respiratory concerns Hx PE  IVC filter placed- complications with LE clots extending in/through IVC, required thrombolytics via IR. P:   - IS/pulmonary hygiene. - OOB as tolerated - Continue anticoagulation with Xarelto. - After 2 - 3 weeks of anticoagulation, can consider removal of IVC if tolerating anticoagulation well. Benefit very high for anticoagulation given filter thrombosis, extensive clot burden legs Will defer to vascular for recs. 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 IVC filter in place High risk for re-bleeding/hemorrhage P: - recommend daily H/H and coags after discharge - close monitoring for any signs of bleeding - cont Xarelto - will need to discuss IVC filter removal at some point proven to tolerate anticoagulation as outpt  Summary: CCM called for assistance with formulating a plan of care for Mr. Betzer prior to discharge and post discharge given his complicated medical situation.  After extensive thoughts regarding Mr. Treinen hx of PE, recurrent DVT, IVC filter obstruction, compartment syndrome, etc. As well as taking into consideration hx of bleeding/hematoma formation, etc. I believe that the benefits of anticoagulation highly outweigh the risks.  Ultimately, Mr. Whyte needs to have his IVC filter removed given the fact that it has caused complications with clotting  if able  I feel that the risks of serious complications which certainly include death from PE warrant the need for continued/long term anticoagulation with Xarelto.  Once Mr. Langhorst is discharged and has had 2 - 3 weeks of anticoagulation therapy, planning for IVC filter removal can be considered.  I will defer to vascular surgery on when they feel it is medically safe to perform this procedure.  Following removal of filter, Mr. Otten would need to continue daily anticoagulation with Xarelto along with daily monitoring of H/H's and coags for signs of bleeding.  This is a rather complicated case which will require strict observation and close collaboration amongst all specialties involved following Mr. Reggio discharge.  Thank you for the call and allowing [place] the opportunity to participate in Mr. Wissmann care.  Rutherford Guys, PA - S  I have fully examined this patient and agree with above findings.    And edited infull  Mcarthur Rossetti. Tyson Alias, MD, FACP Pgr: [phone] [facility]

## 2013-05-28 NOTE — Progress Notes (Signed)
[facility]                                                                                Patient Demographics  Rafel Garde, is a 67 y.o. male, DOB - 1946/01/13, ZDG:[id number]  Admit date - 04/30/2013   Admitting Physician Hillary Bow, DO  Outpatient Primary MD for the patient is [facility], MD  LOS - 28   No chief complaint on file.       Assessment & Plan   S/p transurethral resection of prostate with left ureteroscopic stone manipulation 04/13/13 complicated by perinephric hematoma with bilateral hydronephrosis  - underwent urgent cystoscopy 04/18/2013 with bilateral stent placement and bladder evacuation with suprapubic tube placement  -CT showed decreasing size of hematoma -Urology consulted and replaced suprapubic catheter -Collection bag has slightly blood tinged urine in bag but clear straw color urine in catheter tube itself - Hb stable  Bilateral lower extremity DVT / PE  -status post IVC filter 9/5 - given recent severe complications related to bleeding anticoagulation was not felt to be wise at the time of admission - further review of records revealed Dopplers from 9/7 which were negative for DVT - repeat dopplers this hospitalization confirmed extensive bilateral lower extremity DVTs - d/c'ed testosterone replacement  - due to fear of propagation of clot through IVC filter, heparin was subsequently re-intitiated on 9/22 for anticoag w/ closer monitoring -  - clot extended from lower extremities through IVC and involved filter- he underwent thrombolysis by IR- then developed compartment syndrome and was taken to OR on 10/1 right calf fasciotomy  - back to OR on 10/6 to close faciotomy right leg to close - left leg had fasciotomy on 10/4 with closure 10/8 - on heparin  - No longer on Heparin, foot pumps in place - Xarelto was started on 10/14 and held after a single dose secondary to blood tinged urine. - Discussed with PCCM and appreciate followup note. Patient  developed significant bleeding issues during this admission. However he also has significant risk of VTE-history of PE, recurrent DVT, IVC filter obstruction and compartment syndrome. At this time it is felt that the benefits of anticoagulation outweigh bleeding risks. Thereby, patient will be continued on Xarelto. As per PCCM recommendations, consider removal of IVC filter after 2-3 weeks of being on anticoagulation. However if he does develop significant bleeding again or drop in his hemoglobin, we will be left with no choice but to DC anticoagulation indefinitely. Discussed with patient who verbalizes understanding.  B LE compartment syndrome due to bleeding/hematoma  -S/p B/L fasciotomies - Vasc Surgery has been following -  - Oxycodone Q6 routine with PRN IV Morphine to control pain   C diff colitis  -started flagyl 10/3 for 2 wk course - diarrhea resolved  Acute renal failure/ CKD 3  -Resolved.  Cr 0.98.  (was likely due to prerenal vs ATN from Hypotension) -Nephrology signed off.   -ultrasound and Ct abd/pelvis revealed no acute findings  Progressive anemia / Iron deficiency  -Multifactorial causes: significant recent acute loss plus decreased production  -Will continue Iron supplementation -Hb 7.9, trending upward. Patient has received multiple transfusions during hospital course -Vit B 12-  low normal, will continue to supplement   Hemorrhoidal GI bleed  -bright red blood streaking in stools 9/25 - appears to be from hemorrhoids - no further GI bleeding since 10/3- mucous stools   Hypotension -  BP has now stabilized   Hyperkalemia  resolved   Principal Problem:   Acute renal failure Active Problems:   Chronic renal insufficiency   Sepsis   Hyperkalemia   DVT, bilateral lower limbs   GI bleed   Pulmonary embolism 9/2   Code Status: Full Family Communication: None at this time. Disposition Plan: Possible DC to SNF on 10/16   Procedures  8/29 - TURP w/ L ureteral  stent [facility]  9/02 - admit [facility] w/ V/Q "high probability" for PE >> anticoag >> severe hematuria + anemia + renal failure  9/3 - CT abdom reveals large L renal capsular hematoma  9/5 - transferred to [facility] to PCCM service - noted to have large clot in bladder w/ B hydronephrosis  9/5 - IVC filter placement  9/6 - B ureteral stents placed - open evacuation of clot from bladder- suprapubic tube placed  9/7- venous duplex lower extremity - NO DVT  9/21-venous duplex lower extremity- Bilateral: Positive for DVT in the common femoral, profunda, femoral, popliteal, and posterior tibial veins with superficial thrombus in the greater and lesser saphenous veins.  9/22 - IV heparin initiated for DVTs (anticoagulation had been on hold due to renal hematoma)  9/28: Decision was made to perform a catheter directed lysis of clot and patient was transferred to the ICU for monitoring and further care while on lytics.  9/28-Bilateral popliteal venous acc with initiation of bilateral LE and IVC thrombolysis, popliteal sheaths  9/29- IR inferior venacavagram, thrombolysis/thrombectomy bilateral lower extremities showing improved patency, IVC patent. Advised d/c lytic therapy, continue heparin per pharmacy  9/30 - CT abd/pelvis showed stable L renal hematoma  10/1- Acute hematoma rt leg below knee, compartment syndrome  10/1- calf fasciotomy performed  10/4 LEFT calf fasciotomy performed  10/6 taken back to OR to close RIGHT fasciotomy  10/8 to OR to close LEFT fasciotomy  Consults   Nephrology  Vascular Surgery  IR  Urology PCCM   DVT Prophylaxis  Foot pumps, start on xarelto 10/14  Lab Results  Component Value Date   PLT 279 05/28/2013    Medications  Scheduled Meds: . hydrocortisone   Rectal BID  . metroNIDAZOLE  500 mg Oral Q8H  . oxyCODONE  5 mg Oral Q6H  . rivaroxaban  15 mg Oral BID WC   Followed by  . [START ON 06/18/2013] rivaroxaban  20 mg Oral Q supper  .  sodium chloride  3 mL Intravenous Q12H  . vitamin B-12  1,000 mcg Oral Daily   Continuous Infusions: . sodium chloride 10 mL/hr at 05/22/13 1900  . sodium chloride 100 mL (05/27/13 2144)   PRN Meds:.ALPRAZolam, morphine injection, ondansetron (ZOFRAN) IV  Antibiotics    Anti-infectives   Start     Dose/Rate Route Frequency Ordered Stop   05/21/13 0915  ceFAZolin (ANCEF) IVPB 2 g/50 mL premix     2 g 100 mL/hr over 30 Minutes Intravenous  Once 05/21/13 0900 05/21/13 0855   05/21/13 0823  ceFAZolin (ANCEF) 2-3 GM-% IVPB SOLR    Comments:  Hypes, Karen   : cabinet override      05/21/13 0823 05/21/13 2029   05/17/13 1024  ceFAZolin (ANCEF) 1-5 GM-% IVPB    Comments:  BUTLER, DAVE: cabinet  override      05/17/13 1024 05/17/13 2229   05/17/13 1023  ceFAZolin (ANCEF) 2-3 GM-% IVPB SOLR    Comments:  BUTLER, DAVE: cabinet override      05/17/13 1023 05/17/13 1035   05/16/13 1400  metroNIDAZOLE (FLAGYL) tablet 500 mg     500 mg Oral 3 times per day 05/16/13 1128 05/30/13 1359   05/14/13 1145  ceFAZolin (ANCEF) IVPB 2 g/50 mL premix     2 g 100 mL/hr over 30 Minutes Intravenous To Surgery 05/14/13 1141 05/14/13 1145   05/03/13 1600  piperacillin-tazobactam (ZOSYN) IVPB 3.375 g     3.375 g 12.5 mL/hr over 240 Minutes Intravenous 3 times per day 05/03/13 1222 05/04/13 0213   05/03/13 1000  vancomycin (VANCOCIN) 1,500 mg in sodium chloride 0.9 % 500 mL IVPB  Status:  Discontinued     1,500 mg 250 mL/hr over 120 Minutes Intravenous Every 48 hours 05/01/13 1105 05/01/13 1403   05/01/13 1000  vancomycin (VANCOCIN) 1,500 mg in sodium chloride 0.9 % 500 mL IVPB  Status:  Discontinued     1,500 mg 250 mL/hr over 120 Minutes Intravenous Every 24 hours 05/01/13 0105 05/01/13 1105   04/30/13 1600  piperacillin-tazobactam (ZOSYN) IVPB 2.25 g  Status:  Discontinued     2.25 g 100 mL/hr over 30 Minutes Intravenous Every 8 hours 04/30/13 1342 05/03/13 1222   04/30/13 1200  vancomycin (VANCOCIN) IVPB  1000 mg/200 mL premix  Status:  Discontinued     1,000 mg 200 mL/hr over 60 Minutes Intravenous Every 12 hours 04/30/13 0245 04/30/13 1326   04/30/13 0800  piperacillin-tazobactam (ZOSYN) IVPB 3.375 g  Status:  Discontinued     3.375 g 12.5 mL/hr over 240 Minutes Intravenous Every 8 hours 04/30/13 0245 04/30/13 1326   04/30/13 0300  vancomycin (VANCOCIN) 2,000 mg in sodium chloride 0.9 % 500 mL IVPB     2,000 mg 250 mL/hr over 120 Minutes Intravenous  Once 04/30/13 0245 04/30/13 0510   04/30/13 0300  piperacillin-tazobactam (ZOSYN) IVPB 3.375 g     3.375 g 100 mL/hr over 30 Minutes Intravenous  Once 04/30/13 0245 04/30/13 0340       Time Spent in minutes   30 minutes     Subjective:   Patient denies complaints. Wants to know when he will be discharged. Denies significant pain in legs.  Objective:   Filed Vitals:   05/27/13 2141 05/28/13 0528 05/28/13 1000 05/28/13 1400  BP: 124/60 132/61 115/56 124/66  Pulse: 86 71 78 80  Temp: 99.1 F (37.3 C) 97.8 F (36.6 C) 98.1 F (36.7 C) 98.4 F (36.9 C)  TempSrc: Oral Oral Oral Oral  Resp: 20 19 20 20   Height:      Weight: 124.603 kg (274 lb 11.2 oz)     SpO2: 100% 99% 98% 97%    Wt Readings from Last 3 Encounters:  05/27/13 124.603 kg (274 lb 11.2 oz)  05/27/13 124.603 kg (274 lb 11.2 oz)  05/27/13 124.603 kg (274 lb 11.2 oz)     Intake/Output Summary (Last 24 hours) at 05/28/13 1713 Last data filed at 05/28/13 1549  Gross per 24 hour  Intake 5681.67 ml  Output   5500 ml  Net 181.67 ml    Exam  General: Well developed, well nourished, NAD, appears stated age  Cardiovascular: S1 S2 auscultated, no rubs, murmurs or gallops.  Regular rate and rhythm.  Respiratory: Clear to auscultation bilaterally. No increased work of breathing.  Abdomen: Soft,  obese, nondistended, nontender, + bowel sounds, suprapubic catheter in place the site looks clean and dry. Clear straw-colored urine in catheter tubing and minimally  blood-tinged urine in the bag.  Extremities: Lower extremities both are wrapped. Otherwise warm dry without cyanosis clubbing.  Edema note in lower extremities bilaterally.  Neuro: AAOx3, cranial nerves grossly intact.      Data Review   Micro Results Recent Results (from the past 240 hour(s))  URINE CULTURE     Status: None   Collection Time    05/27/13  2:22 PM      Result Value Range Status   Specimen Description URINE, CLEAN CATCH   Final   Special Requests NONE   Final   Culture  Setup Time     Final   Value: 05/27/2013 15:49     Performed at [facility] Count     Final   Value: NO GROWTH     Performed at [facility]   Culture     Final   Value: NO GROWTH     Performed at [facility]   Report Status 05/28/2013 FINAL   Final    Radiology Reports Ct Abdomen Pelvis W Wo Contrast  05/24/2013   CLINICAL DATA:  67 year old male with hematuria. Patient with bilateral urinary stents and subcapsular hematoma. Marland Kitchen  EXAM: CT ABDOMEN AND PELVIS WITHOUT AND WITH CONTRAST  TECHNIQUE: Multidetector CT imaging of the abdomen and pelvis was performed without contrast material in one or both body regions, followed by contrast material(s) and further sections in one or both body regions. The patient was pre-medicated by the clinician due to contrast allergy history.  CONTRAST:  OMNIPAQUE IOHEXOL 300 MG/ML  SOLN  COMPARISON:  05/13/2013 and prior CTs  FINDINGS: Tiny bilateral pleural effusions are noted.  The left subcapsular renal hematoma has decreased in size, now measuring 10 mm in thickness, previously 16 mm.  Bilateral renal atrophy, moderate to severe renal cortical thinning, left renal cyst, nonobstructing left renal calculi and bilateral urinary stents are unchanged. The urinary stents are unchanged in appearance with tips in the bladder and renal collecting systems.  Mild right hydronephrosis is slightly increased since the prior study. On the  delayed images, small filling defects within the right renal calices are noted and may represent small clots, sloughed papilla (papillary necrosis, but not identified on the left side), atypical infection or less likely non opaque calculi. Other etiologies are less likely.  Suprapubic catheter and thick walled bladder are unchanged.  An infrarenal IVC filter is again identified.  The liver, spleen, pancreas, gallbladder and adrenal glands are within normal limits.  No free fluid, enlarged lymph nodes, biliary dilatation or abdominal aortic aneurysm noted.  The bowel, bladder and appendix are unremarkable. There is no evidence of bowel obstruction, abscess or pneumoperitoneum.  No acute or suspicious bony abnormalities are identified.  IMPRESSION: Decreased left subcapsular renal hematoma, now measuring 10 mm, previously 16 mm.  Slightly increasing mild right hydronephrosis. Small filling defects within the right renal calices now noted and differential includes small clots, atypical/fungal infection or less likely non opaque calculi. Uroepithelial neoplasm is considered less likely.  Unchanged bilateral renal atrophy, nonobstructing left renal calculi, bilateral urinary stents and thick-walled bladder.   Electronically Signed   By: Laveda Abbe M.D.   On: 05/24/2013 16:43   Ct Abdomen Pelvis Wo Contrast  05/13/2013   *RADIOLOGY REPORT*  Clinical Data: reassess renal hematomas  CT ABDOMEN AND  PELVIS WITHOUT CONTRAST  Technique:  Multidetector CT imaging of the abdomen and pelvis was performed following the standard protocol without intravenous contrast.  Comparison: 05/10/13  Findings: Slight increase in small bilateral pleural effusions.  Liver, gallbladder, spleen, pancreas, and adrenal glands normal and unchanged, except for incidental punctate calcification right adrenal gland.  Bilateral ureteral stents unchanged.  16 mm thick  subcapsular hematoma left kidney stable.  Large left upper pole cyst stable. Small  right exophytic mid pole cyst stable.  Suprapubic bladder catheter unchanged.  IVC filter stable.  Bowel remains unremarkable.  IMPRESSION: Stable subcapsular left renal hematoma.Small bilateral pleural effusions, mildly increased.   Original Report Authenticated By: Esperanza Heir, M.D.   Ct Abdomen Pelvis Wo Contrast  04/30/2013   *RADIOLOGY REPORT*  Clinical Data:   Acute renal failure.  CT ABDOMEN AND PELVIS WITHOUT CONTRAST  Technique:  Multidetector CT imaging of the abdomen and pelvis was performed following the standard protocol without intravenous contrast.  Comparison: CT scan from 04/16/2013  Findings: Focal atelectasis or pneumonia is seen in the posterior left base.  No focal abnormalities seen in the liver or spleen on this study performed without intravenous contrast material.  The stomach is decompressed.  The duodenum, pancreas, gallbladder, and adrenal glands are unremarkable.  Stable appearance of the 7.8 cm cyst in the upper pole of the left kidney.  6 mm nonobstructing stone is seen in the lower pole of the left kidney.  The left sub capsular hematoma has decreased in the interval measuring about 1.7 cm in thickness today compared 2.4 cm previously.  The left double-J internal ureteral stent remains in place and is stable in position. There is been interval placement of a right double-J internal ureteral stent with the proximal loop formed and upper pole calix and the lower loop formed in the urinary bladder.  IVC filter again noted.  The no abdominal aortic aneurysm.  No free fluid or lymphadenopathy in the abdomen.  Imaging through the pelvis shows no apparent midline lower abdominal wound suggesting recent surgery.  Edema or inflammation is seen in the soft tissues of the extraperitoneal pelvic floor. Bilateral inguinal hernias contain only fat.  There is subcutaneous edema in the lower abdomen and pelvis bilaterally.  No colonic diverticulitis.  No evidence for bowel obstruction.  The  patient is status post right hemicolectomy.  Urinary bladder is decompressed by a suprapubic tube.  Bone windows reveal no worrisome lytic or sclerotic osseous lesions.  IMPRESSION: Interval decrease in size of the left renal subcapsular fluid collection, suggesting resolving hematoma.  The patient has bilateral internal ureteral stents without overt hydronephrosis at this time.  There is some fullness of the right intrarenal collecting system which is stable to mildly increased in the interval.  Fullness of the left intrarenal collecting system seen previously has decreased in the interval.   Original Report Authenticated By: Kennith Center, M.D.   Dg Chest 2 View  04/27/2013   *RADIOLOGY REPORT*  Clinical Data: Chest pain, extremity numbness  CHEST - 2 VIEW  Comparison: Prior chest x-ray 04/21/2013  Findings: Stable low inspiratory volumes with perhaps trace subsegmental atelectasis.  The lungs are otherwise clear.  Cardiac and mediastinal contours are upper limits of normal but unchanged compared to prior.  No acute osseous abnormality.  Remote healed right-sided rib fractures.  No pleural effusion or pneumothorax.  IMPRESSION: Low inspiratory volumes, but otherwise no acute cardiopulmonary process.   Original Report Authenticated By: Malachy Moan, M.D.   Ir Veno/ext/bi  05/11/2013   *RADIOLOGY REPORT*  PRIOR ULTRASOUND GUIDED VASCULAR ACCESS; IR INFERIOR VENA CAVA GRAM; IR THROMBOLYSIS/THROMBECTOMY BILATERAL LOWER EXTREMITIES; IR INITIATION OF VENOUS LYSIS INITIAL DAY  Date: 05/11/2013  Clinical History: 67 year old male with complicated medical history.  He had a TURP and left double-J ureteral stent placement procedure done in the end of August which was complicated by postoperative PE and subsequent postoperative hemorrhage following initiation of anticoagulation.  Hemostasis was obtained and IVC filter was placed.  The patient subsequently developed extensive bilateral lower extremity swelling and  was found to have extensive bilateral lower extremity and caval DVT extending into the filter.  He is now nearly 4 weeks post operative and showing in significant improvement with systemic heparinization.  He presents for attempted pharmacomechanical catheter directed thrombolysis/thrombectomy.  Procedures Performed: 1. Ultrasound-guided puncture of the left popliteal vein 2.  Catheterization of the inferior vena cava above the IVC filter with vena cava gram 3.  Power pulse spray infusion of 6 mg of TPA from the popliteal access to the infrarenal IVC 4.  Placement of a 90 cm total length 50 cm infusion length multi side-hole infusion catheter 5.  Ultrasound-guided puncture of the right popliteal vein 6.  Catheterization of the inferior vena cava above the IVC filter 7.  Power pulse spray infusion of 6 mg of TPA from the right popliteal access into the infrarenal IVC 8.  Placement of a 130 cm total length 50 cm infusion length multi side-hole infusion catheter 9.  Initiation of venous thrombolysis  Interventional Radiologist:  Sterling Big, MD  Sedation: Moderate (conscious) sedation was used.  Four mg Versed, 100 mcg Fentanyl were administered intravenously.  The patient's vital signs were monitored continuously by radiology nursing throughout the procedure.  Sedation Time: 60 minutes  Fluoroscopy time: 15 minutes 6 seconds  Contrast volume: 10 ml Omnipaque-300  Intravenous medications:  A total of 12 mg TPA was administered into the thrombus  PROCEDURE/FINDINGS:   Informed consent was obtained from the patient following explanation of the procedure, risks, benefits and alternatives. The patient understands, agrees and consents for the procedure. All questions were addressed. A time out was performed.  Maximal barrier sterile technique utilized including caps, mask, sterile gowns, sterile gloves, large sterile drape, hand hygiene, and betadine skin prep.  The left popliteal fossa was interrogated with  ultrasound.  The popliteal vein is expanded and completely thrombosed.  Local anesthesia was attained by infiltration with 1% lidocaine.  Under real time sonographic guidance, the thrombosed vein was punctured with a 21-gauge micropuncture needle.  Images obtained stored for the medical record.  The transitional micro sheath was upsized to a 6-French working vascular sheath over a short Amplatz wire.  Using a stiff Glidewire and angled catheter, the catheter was used to navigate into the infrarenal IVC just beyond the nose cone of the IVC filter.  An inferior vena cava gram was performed limiting the amount of intravenous contrast used.  The left renal vein and inferior vena cava are patent.  Inflow from the right renal vein is also noted indicating patency.  The wire was advanced into the suprarenal IVC.  The 6-French 90 cm length AngioJet device was then used to power pulse spray a total of 6 mg of TPA and 125 ml of saline from the popliteal vein throughout the left lower extremity and into the IVC filter.  A 90 cm total length, 50 cm infusion length Unifuse infusion catheter was then positioned over the wire so that  the proximal side hole was just within the filter, and the more proximal side hole in the mid thigh.  The sheath and catheter were secured to the skin with post silk suture.  The right popliteal fossa was interrogated with ultrasound.  Again, the popliteal vein was found be expanded completely thrombosed. Local anesthesia was attained by infiltration with 1% lidocaine. Under real time sonographic guidance, the vein was punctured using a 21-gauge micropuncture needle.  Images obtained stored for the medical record.  A 6-French working vascular sheath was then placed over a stiff Amplatz wire after transitioning from the transitional micro sheath.  The wire was advanced into the suprarenal IVC.  Care was taken to navigate the wire through the right aspect of the IVC and the filter to maximize lytic  dispersal.  The 6-French AngioJet was then used to power pulse spray a total of 6 mg of TPA and 125 ml of saline from the popliteal artery into the IVC.  A 130 cm total length 50 cm infusion length Unifuse multi side-hole infusion catheter was then placed in identical position to the contralateral side.  Initial venous lysis was initiated at 0.25 mg connector place through each catheter for a total of 0.5 mg per hour.  Overall, the patient tolerated the procedure very well.  There was no immediate complication.  IMPRESSION:  1.  Inferior venacavagram demonstrates patency of the renal veins and IVC above the level of the filter.  2. Initial limited bilateral lower extremity pharmacomechanical thrombolysis.  3.  Initiation of bilateral lower extremity venous lysis using bilateral multi side-hole infusion catheters.  The patient will lyse for 12-24 hours and return interventional radiology for venogram and potential adjunctive interventions tomorrow.  Signed,  Sterling Big, MD [facility]   Original Report Authenticated By: Malachy Moan, M.D.   Ir Caffie Damme Ivc  05/11/2013   *RADIOLOGY REPORT*  PRIOR ULTRASOUND GUIDED VASCULAR ACCESS; IR INFERIOR VENA CAVA GRAM; IR THROMBOLYSIS/THROMBECTOMY BILATERAL LOWER EXTREMITIES; IR INITIATION OF VENOUS LYSIS INITIAL DAY  Date: 05/11/2013  Clinical History: 67 year old male with complicated medical history.  He had a TURP and left double-J ureteral stent placement procedure done in the end of August which was complicated by postoperative PE and subsequent postoperative hemorrhage following initiation of anticoagulation.  Hemostasis was obtained and IVC filter was placed.  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  He is now nearly 4 weeks post operative and showing in  significant improvement with systemic heparinization.  He presents for attempted pharmacomechanical catheter directed thrombolysis/thrombectomy.  Procedures Performed: 1. Ultrasound-guided puncture of the left popliteal vein 2.  Catheterization of the inferior vena cava above the IVC filter with vena cava gram 3.  Power pulse spray infusion of 6 mg of TPA from the popliteal access to the infrarenal IVC 4.  Placement of a 90 cm total length 50 cm infusion length multi side-hole infusion catheter 5.  Ultrasound-guided puncture of the right popliteal vein 6.  Catheterization of the inferior vena cava above the IVC filter 7.  Power pulse spray infusion of 6 mg of TPA from the right popliteal access into the infrarenal IVC 8.  Placement of a 130 cm total length 50 cm infusion length multi side-hole infusion catheter 9.  Initiation of venous thrombolysis  Interventional Radiologist:  Sterling Big, MD  Sedation: Moderate (conscious) sedation was used.  Four mg Versed, 100 mcg Fentanyl were administered intravenously.  The patient's vital signs were monitored continuously by radiology nursing throughout the procedure.  Sedation Time: 60 minutes  Fluoroscopy time: 15 minutes 6 seconds  Contrast volume: 10 ml Omnipaque-300  Intravenous medications:  A total of 12 mg TPA was administered into the thrombus  PROCEDURE/FINDINGS:   Informed consent was obtained from the patient following explanation of the procedure, risks, benefits and alternatives. The patient understands, agrees and consents for the procedure. All questions were addressed. A time out was performed.  Maximal barrier sterile technique utilized including caps, mask, sterile gowns, sterile gloves, large sterile drape, hand hygiene, and betadine skin prep.  The left popliteal fossa was interrogated with ultrasound.  The popliteal vein is expanded and completely thrombosed.  Local anesthesia was attained by infiltration with 1% lidocaine.  Under real time  sonographic guidance, the thrombosed vein was punctured with a 21-gauge micropuncture needle.  Images obtained stored for the medical record.  The transitional micro sheath was upsized to a 6-French working vascular sheath over a short Amplatz wire.  Using a stiff Glidewire and angled catheter, the catheter was used to navigate into the infrarenal IVC just beyond the nose cone of the IVC filter.  An inferior vena cava gram was performed limiting the amount of intravenous contrast used.  The left renal vein and inferior vena cava are patent.  Inflow from the right renal vein is also noted indicating patency.  The wire was advanced into the suprarenal IVC.  The 6-French 90 cm length AngioJet device was then used to power pulse spray a total of 6 mg of TPA and 125 ml of saline from the popliteal vein throughout the left lower extremity and into the IVC filter.  A 90 cm total length, 50 cm infusion length Unifuse infusion catheter was then positioned over the wire so that the proximal side hole was just within the filter, and the more proximal side hole in the mid thigh.  The sheath and catheter were secured to the skin with post silk suture.  The right popliteal fossa was interrogated with ultrasound.  Again, the popliteal vein was found be expanded completely thrombosed. Local anesthesia was attained by infiltration with 1% lidocaine. Under real time sonographic guidance, the vein was punctured using a 21-gauge micropuncture needle.  Images obtained stored for the medical record.  A 6-French working vascular sheath was then placed over a stiff Amplatz wire after transitioning from the transitional micro sheath.  The wire was advanced into the suprarenal IVC.  Care was taken to navigate the wire through the right aspect of the IVC and the filter to maximize lytic dispersal.  The 6-French AngioJet was then used to power pulse spray a total of 6 mg of TPA and 125 ml of saline from the popliteal artery into the IVC.  A  130 cm total length 50 cm infusion length Unifuse multi side-hole infusion catheter was then placed in identical position to the contralateral side.  Initial venous lysis was initiated at 0.25 mg connector place through each catheter for a total of 0.5 mg per hour.  Overall, the patient tolerated the procedure very well.  There was no immediate complication.  IMPRESSION:  1.  Inferior venacavagram demonstrates patency of the renal veins and IVC above the level of the filter.  2. Initial limited bilateral lower extremity pharmacomechanical thrombolysis.  3.  Initiation of bilateral lower extremity venous lysis using bilateral multi side-hole infusion catheters.  The  patient will lyse for 12-24 hours and return interventional radiology for venogram and potential adjunctive interventions tomorrow.  Signed,  Sterling Big, MD [facility] Radiology   Original Report Authenticated By: Malachy Moan, M.D.   US Renal  04/30/2013   CLINICAL DATA:  Acute renal failure.  EXAM: RENAL/URINARY TRACT ULTRASOUND COMPLETE  COMPARISON:  04/18/2013  FINDINGS: Right Kidney  Length: 10.7 cm. Mild right hydronephrosis. Stent is visualized within the dilated renal pelvis. 1.4 cm cyst.  Left Kidney  Length: 14.0 cm. Slight pelvicaliectasis. Stent cannot be visualized.  7.1 cm cystic mass in the upper pole of the left kidney. This likely represents a benign cyst and is stable since prior study.  Bladder:  Cannot visualize.  IMPRESSION: Mild right hydronephrosis and slight pelvicaliectasis on the left. These findings have improved since prior study.  Bilateral renal cysts.   Electronically Signed   By: Charlett Nose M.D.   On: 04/30/2013 16:58   Ir Thrombect Veno Mech Mod Sed  05/11/2013   *RADIOLOGY REPORT*  PRIOR ULTRASOUND GUIDED VASCULAR ACCESS; IR INFERIOR VENA CAVA GRAM; IR THROMBOLYSIS/THROMBECTOMY BILATERAL LOWER EXTREMITIES; IR INITIATION OF VENOUS LYSIS INITIAL DAY  Date:  05/11/2013  Clinical History: 67 year old male with complicated medical history.  He had a TURP and left double-J ureteral stent placement procedure done in the end of August which was complicated by postoperative PE and subsequent postoperative hemorrhage following initiation of anticoagulation.  Hemostasis was obtained and IVC filter was placed.  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  He is now nearly 4 weeks post operative and showing in significant improvement with systemic heparinization.  He presents for attempted pharmacomechanical catheter directed thrombolysis/thrombectomy.  Procedures Performed: 1. Ultrasound-guided puncture of the left popliteal vein 2.  Catheterization of the inferior vena cava above the IVC filter with vena cava gram 3.  Power pulse spray infusion of 6 mg of TPA from the popliteal access to the infrarenal IVC 4.  Placement of a 90 cm total length 50 cm infusion length multi side-hole infusion catheter 5.  Ultrasound-guided puncture of the right popliteal vein 6.  Catheterization of the inferior vena cava above the IVC filter 7.  Power pulse spray infusion of 6 mg of TPA from the right popliteal access into the infrarenal IVC 8.  Placement of a 130 cm total length 50 cm infusion length multi side-hole infusion catheter 9.  Initiation of venous thrombolysis  Interventional Radiologist:  Sterling Big, MD  Sedation: Moderate (conscious) sedation was used.  Four mg Versed, 100 mcg Fentanyl were administered intravenously.  The patient's vital signs were monitored continuously by radiology nursing throughout the procedure.  Sedation Time: 60 minutes  Fluoroscopy time: 15 minutes 6 seconds  Contrast volume: 10 ml Omnipaque-300  Intravenous medications:  A total of 12 mg TPA was administered into the thrombus  PROCEDURE/FINDINGS:   Informed consent  was obtained from the patient following explanation of the procedure, risks, benefits and alternatives. The patient understands, agrees and consents for the procedure. All questions were addressed. A time out was performed.  Maximal barrier sterile technique utilized including caps, mask, sterile gowns, sterile gloves, large sterile drape, hand hygiene, and betadine skin prep.  The left popliteal fossa was interrogated with ultrasound.  The popliteal vein is expanded and completely thrombosed.  Local anesthesia was attained by infiltration with 1% lidocaine.  Under  real time sonographic guidance, the thrombosed vein was punctured with a 21-gauge micropuncture needle.  Images obtained stored for the medical record.  The transitional micro sheath was upsized to a 6-French working vascular sheath over a short Amplatz wire.  Using a stiff Glidewire and angled catheter, the catheter was used to navigate into the infrarenal IVC just beyond the nose cone of the IVC filter.  An inferior vena cava gram was performed limiting the amount of intravenous contrast used.  The left renal vein and inferior vena cava are patent.  Inflow from the right renal vein is also noted indicating patency.  The wire was advanced into the suprarenal IVC.  The 6-French 90 cm length AngioJet device was then used to power pulse spray a total of 6 mg of TPA and 125 ml of saline from the popliteal vein throughout the left lower extremity and into the IVC filter.  A 90 cm total length, 50 cm infusion length Unifuse infusion catheter was then positioned over the wire so that the proximal side hole was just within the filter, and the more proximal side hole in the mid thigh.  The sheath and catheter were secured to the skin with post silk suture.  The right popliteal fossa was interrogated with ultrasound.  Again, the popliteal vein was found be expanded completely thrombosed. Local anesthesia was attained by infiltration with 1% lidocaine. Under real  time sonographic guidance, the vein was punctured using a 21-gauge micropuncture needle.  Images obtained stored for the medical record.  A 6-French working vascular sheath was then placed over a stiff Amplatz wire after transitioning from the transitional micro sheath.  The wire was advanced into the suprarenal IVC.  Care was taken to navigate the wire through the right aspect of the IVC and the filter to maximize lytic dispersal.  The 6-French AngioJet was then used to power pulse spray a total of 6 mg of TPA and 125 ml of saline from the popliteal artery into the IVC.  A 130 cm total length 50 cm infusion length Unifuse multi side-hole infusion catheter was then placed in identical position to the contralateral side.  Initial venous lysis was initiated at 0.25 mg connector place through each catheter for a total of 0.5 mg per hour.  Overall, the patient tolerated the procedure very well.  There was no immediate complication.  IMPRESSION:  1.  Inferior venacavagram demonstrates patency of the renal veins and IVC above the level of the filter.  2. Initial limited bilateral lower extremity pharmacomechanical thrombolysis.  3.  Initiation of bilateral lower extremity venous lysis using bilateral multi side-hole infusion catheters.  The patient will lyse for 12-24 hours and return interventional radiology for venogram and potential adjunctive interventions tomorrow.  Signed,  Sterling Big, MD [facility] Radiology   Original Report Authenticated By: Malachy Moan, M.D.   Ir Thrombect Veno Mech Mod Sed  05/11/2013   *RADIOLOGY REPORT*  PRIOR ULTRASOUND GUIDED VASCULAR ACCESS; IR INFERIOR VENA CAVA GRAM; IR THROMBOLYSIS/THROMBECTOMY BILATERAL LOWER EXTREMITIES; IR INITIATION OF VENOUS LYSIS INITIAL DAY  Date: 05/11/2013  Clinical History: 67 year old male with complicated medical history.  He had a TURP and left double-J ureteral stent placement procedure done  in the end of August which was complicated by postoperative PE and subsequent postoperative hemorrhage following initiation of anticoagulation.  Hemostasis was obtained and IVC filter was placed.  The patient subsequently developed extensive bilateral lower extremity swelling and was found to have extensive  bilateral lower extremity and caval DVT extending into the filter.  He is now nearly 4 weeks post operative and showing in significant improvement with systemic heparinization.  He presents for attempted pharmacomechanical catheter directed thrombolysis/thrombectomy.  Procedures Performed: 1. Ultrasound-guided puncture of the left popliteal vein 2.  Catheterization of the inferior vena cava above the IVC filter with vena cava gram 3.  Power pulse spray infusion of 6 mg of TPA from the popliteal access to the infrarenal IVC 4.  Placement of a 90 cm total length 50 cm infusion length multi side-hole infusion catheter 5.  Ultrasound-guided puncture of the right popliteal vein 6.  Catheterization of the inferior vena cava above the IVC filter 7.  Power pulse spray infusion of 6 mg of TPA from the right popliteal access into the infrarenal IVC 8.  Placement of a 130 cm total length 50 cm infusion length multi side-hole infusion catheter 9.  Initiation of venous thrombolysis  Interventional Radiologist:  Sterling Big, MD  Sedation: Moderate (conscious) sedation was used.  Four mg Versed, 100 mcg Fentanyl were administered intravenously.  The patient's vital signs were monitored continuously by radiology nursing throughout the procedure.  Sedation Time: 60 minutes  Fluoroscopy time: 15 minutes 6 seconds  Contrast volume: 10 ml Omnipaque-300  Intravenous medications:  A total of 12 mg TPA was administered into the thrombus  PROCEDURE/FINDINGS:   Informed consent was obtained from the patient following explanation of the procedure, risks, benefits and alternatives. The patient understands, agrees and consents for  the procedure. All questions were addressed. A time out was performed.  Maximal barrier sterile technique utilized including caps, mask, sterile gowns, sterile gloves, large sterile drape, hand hygiene, and betadine skin prep.  The left popliteal fossa was interrogated with ultrasound.  The popliteal vein is expanded and completely thrombosed.  Local anesthesia was attained by infiltration with 1% lidocaine.  Under real time sonographic guidance, the thrombosed vein was punctured with a 21-gauge micropuncture needle.  Images obtained stored for the medical record.  The transitional micro sheath was upsized to a 6-French working vascular sheath over a short Amplatz wire.  Using a stiff Glidewire and angled catheter, the catheter was used to navigate into the infrarenal IVC just beyond the nose cone of the IVC filter.  An inferior vena cava gram was performed limiting the amount of intravenous contrast used.  The left renal vein and inferior vena cava are patent.  Inflow from the right renal vein is also noted indicating patency.  The wire was advanced into the suprarenal IVC.  The 6-French 90 cm length AngioJet device was then used to power pulse spray a total of 6 mg of TPA and 125 ml of saline from the popliteal vein throughout the left lower extremity and into the IVC filter.  A 90 cm total length, 50 cm infusion length Unifuse infusion catheter was then positioned over the wire so that the proximal side hole was just within the filter, and the more proximal side hole in the mid thigh.  The sheath and catheter were secured to the skin with post silk suture.  The right popliteal fossa was interrogated with ultrasound.  Again, the popliteal vein was found be expanded completely thrombosed. Local anesthesia was attained by infiltration with 1% lidocaine. Under real time sonographic guidance, the vein was punctured using a 21-gauge micropuncture needle.  Images obtained stored for the medical record.  A 6-French  working vascular sheath was then placed over a stiff Amplatz  wire after transitioning from the transitional micro sheath.  The wire was advanced into the suprarenal IVC.  Care was taken to navigate the wire through the right aspect of the IVC and the filter to maximize lytic dispersal.  The 6-French AngioJet was then used to power pulse spray a total of 6 mg of TPA and 125 ml of saline from the popliteal artery into the IVC.  A 130 cm total length 50 cm infusion length Unifuse multi side-hole infusion catheter was then placed in identical position to the contralateral side.  Initial venous lysis was initiated at 0.25 mg connector place through each catheter for a total of 0.5 mg per hour.  Overall, the patient tolerated the procedure very well.  There was no immediate complication.  IMPRESSION:  1.  Inferior venacavagram demonstrates patency of the renal veins and IVC above the level of the filter.  2. Initial limited bilateral lower extremity pharmacomechanical thrombolysis.  3.  Initiation of bilateral lower extremity venous lysis using bilateral multi side-hole infusion catheters.  The patient will lyse for 12-24 hours and return interventional radiology for venogram and potential adjunctive interventions tomorrow.  Signed,  Sterling Big, MD [facility] Radiology   Original Report Authenticated By: Malachy Moan, M.D.   Ir US Guide Vasc Access Left  05/11/2013   *RADIOLOGY REPORT*  PRIOR ULTRASOUND GUIDED VASCULAR ACCESS; IR INFERIOR VENA CAVA GRAM; IR THROMBOLYSIS/THROMBECTOMY BILATERAL LOWER EXTREMITIES; IR INITIATION OF VENOUS LYSIS INITIAL DAY  Date: 05/11/2013  Clinical History: 68 year old male with complicated medical history.  He had a TURP and left double-J ureteral stent placement procedure done in the end of August which was complicated by postoperative PE and subsequent postoperative hemorrhage following initiation of anticoagulation.   Hemostasis was obtained and IVC filter was placed.  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  He is now nearly 4 weeks post operative and showing in significant improvement with systemic heparinization.  He presents for attempted pharmacomechanical catheter directed thrombolysis/thrombectomy.  Procedures Performed: 1. Ultrasound-guided puncture of the left popliteal vein 2.  Catheterization of the inferior vena cava above the IVC filter with vena cava gram 3.  Power pulse spray infusion of 6 mg of TPA from the popliteal access to the infrarenal IVC 4.  Placement of a 90 cm total length 50 cm infusion length multi side-hole infusion catheter 5.  Ultrasound-guided puncture of the right popliteal vein 6.  Catheterization of the inferior vena cava above the IVC filter 7.  Power pulse spray infusion of 6 mg of TPA from the right popliteal access into the infrarenal IVC 8.  Placement of a 130 cm total length 50 cm infusion length multi side-hole infusion catheter 9.  Initiation of venous thrombolysis  Interventional Radiologist:  Sterling Big, MD  Sedation: Moderate (conscious) sedation was used.  Four mg Versed, 100 mcg Fentanyl were administered intravenously.  The patient's vital signs were monitored continuously by radiology nursing throughout the procedure.  Sedation Time: 60 minutes  Fluoroscopy time: 15 minutes 6 seconds  Contrast volume: 10 ml Omnipaque-300  Intravenous medications:  A total of 12 mg TPA was administered into the thrombus  PROCEDURE/FINDINGS:   Informed consent was obtained from the patient following explanation of the procedure, risks, benefits and alternatives. The patient understands, agrees and consents for the procedure. All questions were addressed. A time out was performed.  Maximal barrier sterile technique utilized including caps, mask, sterile  gowns, sterile gloves, large sterile drape, hand hygiene, and betadine skin prep.  The left popliteal fossa was interrogated with ultrasound.  The popliteal vein is expanded and completely thrombosed.  Local anesthesia was attained by infiltration with 1% lidocaine.  Under real time sonographic guidance, the thrombosed vein was punctured with a 21-gauge micropuncture needle.  Images obtained stored for the medical record.  The transitional micro sheath was upsized to a 6-French working vascular sheath over a short Amplatz wire.  Using a stiff Glidewire and angled catheter, the catheter was used to navigate into the infrarenal IVC just beyond the nose cone of the IVC filter.  An inferior vena cava gram was performed limiting the amount of intravenous contrast used.  The left renal vein and inferior vena cava are patent.  Inflow from the right renal vein is also noted indicating patency.  The wire was advanced into the suprarenal IVC.  The 6-French 90 cm length AngioJet device was then used to power pulse spray a total of 6 mg of TPA and 125 ml of saline from the popliteal vein throughout the left lower extremity and into the IVC filter.  A 90 cm total length, 50 cm infusion length Unifuse infusion catheter was then positioned over the wire so that the proximal side hole was just within the filter, and the more proximal side hole in the mid thigh.  The sheath and catheter were secured to the skin with post silk suture.  The right popliteal fossa was interrogated with ultrasound.  Again, the popliteal vein was found be expanded completely thrombosed. Local anesthesia was attained by infiltration with 1% lidocaine. Under real time sonographic guidance, the vein was punctured using a 21-gauge micropuncture needle.  Images obtained stored for the medical record.  A 6-French working vascular sheath was then placed over a stiff Amplatz wire after transitioning from the transitional micro sheath.  The wire was advanced into  the suprarenal IVC.  Care was taken to navigate the wire through the right aspect of the IVC and the filter to maximize lytic dispersal.  The 6-French AngioJet was then used to power pulse spray a total of 6 mg of TPA and 125 ml of saline from the popliteal artery into the IVC.  A 130 cm total length 50 cm infusion length Unifuse multi side-hole infusion catheter was then placed in identical position to the contralateral side.  Initial venous lysis was initiated at 0.25 mg connector place through each catheter for a total of 0.5 mg per hour.  Overall, the patient tolerated the procedure very well.  There was no immediate complication.  IMPRESSION:  1.  Inferior venacavagram demonstrates patency of the renal veins and IVC above the level of the filter.  2. Initial limited bilateral lower extremity pharmacomechanical thrombolysis.  3.  Initiation of bilateral lower extremity venous lysis using bilateral multi side-hole infusion catheters.  The patient will lyse for 12-24 hours and return interventional radiology for venogram and potential adjunctive interventions tomorrow.  Signed,  Sterling Big, MD [facility] Radiology   Original Report Authenticated By: Malachy Moan, M.D.   Ir US Guide Vasc Access Right  05/11/2013   *RADIOLOGY REPORT*  PRIOR ULTRASOUND GUIDED VASCULAR ACCESS; IR INFERIOR VENA CAVA GRAM; IR THROMBOLYSIS/THROMBECTOMY BILATERAL LOWER EXTREMITIES; IR INITIATION OF VENOUS LYSIS INITIAL DAY  Date: 05/11/2013  Clinical History: 67 year old male with complicated medical history.  He had a TURP and  left double-J ureteral stent placement procedure done in the end of August which was complicated by postoperative PE and subsequent postoperative hemorrhage following initiation of anticoagulation.  Hemostasis was obtained and IVC filter was placed.  The patient subsequently developed extensive bilateral lower extremity swelling and was found to have  extensive bilateral lower extremity and caval DVT extending into the filter.  He is now nearly 4 weeks post operative and showing in significant improvement with systemic heparinization.  He presents for attempted pharmacomechanical catheter directed thrombolysis/thrombectomy.  Procedures Performed: 1. Ultrasound-guided puncture of the left popliteal vein 2.  Catheterization of the inferior vena cava above the IVC filter with vena cava gram 3.  Power pulse spray infusion of 6 mg of TPA from the popliteal access to the infrarenal IVC 4.  Placement of a 90 cm total length 50 cm infusion length multi side-hole infusion catheter 5.  Ultrasound-guided puncture of the right popliteal vein 6.  Catheterization of the inferior vena cava above the IVC filter 7.  Power pulse spray infusion of 6 mg of TPA from the right popliteal access into the infrarenal IVC 8.  Placement of a 130 cm total length 50 cm infusion length multi side-hole infusion catheter 9.  Initiation of venous thrombolysis  Interventional Radiologist:  Sterling Big, MD  Sedation: Moderate (conscious) sedation was used.  Four mg Versed, 100 mcg Fentanyl were administered intravenously.  The patient's vital signs were monitored continuously by radiology nursing throughout the procedure.  Sedation Time: 60 minutes  Fluoroscopy time: 15 minutes 6 seconds  Contrast volume: 10 ml Omnipaque-300  Intravenous medications:  A total of 12 mg TPA was administered into the thrombus  PROCEDURE/FINDINGS:   Informed consent was obtained from the patient following explanation of the procedure, risks, benefits and alternatives. The patient understands, agrees and consents for the procedure. All questions were addressed. A time out was performed.  Maximal barrier sterile technique utilized including caps, mask, sterile gowns, sterile gloves, large sterile drape, hand hygiene, and betadine skin prep.  The left popliteal fossa was interrogated with ultrasound.  The  popliteal vein is expanded and completely thrombosed.  Local anesthesia was attained by infiltration with 1% lidocaine.  Under real time sonographic guidance, the thrombosed vein was punctured with a 21-gauge micropuncture needle.  Images obtained stored for the medical record.  The transitional micro sheath was upsized to a 6-French working vascular sheath over a short Amplatz wire.  Using a stiff Glidewire and angled catheter, the catheter was used to navigate into the infrarenal IVC just beyond the nose cone of the IVC filter.  An inferior vena cava gram was performed limiting the amount of intravenous contrast used.  The left renal vein and inferior vena cava are patent.  Inflow from the right renal vein is also noted indicating patency.  The wire was advanced into the suprarenal IVC.  The 6-French 90 cm length AngioJet device was then used to power pulse spray a total of 6 mg of TPA and 125 ml of saline from the popliteal vein throughout the left lower extremity and into the IVC filter.  A 90 cm total length, 50 cm infusion length Unifuse infusion catheter was then positioned over the wire so that the proximal side hole was just within the filter, and the more proximal side hole in the mid thigh.  The sheath and catheter were secured to the skin with post silk suture.  The right popliteal fossa was interrogated with ultrasound.  Again, the popliteal  vein was found be expanded completely thrombosed. Local anesthesia was attained by infiltration with 1% lidocaine. Under real time sonographic guidance, the vein was punctured using a 21-gauge micropuncture needle.  Images obtained stored for the medical record.  A 6-French working vascular sheath was then placed over a stiff Amplatz wire after transitioning from the transitional micro sheath.  The wire was advanced into the suprarenal IVC.  Care was taken to navigate the wire through the right aspect of the IVC and the filter to maximize lytic dispersal.  The  6-French AngioJet was then used to power pulse spray a total of 6 mg of TPA and 125 ml of saline from the popliteal artery into the IVC.  A 130 cm total length 50 cm infusion length Unifuse multi side-hole infusion catheter was then placed in identical position to the contralateral side.  Initial venous lysis was initiated at 0.25 mg connector place through each catheter for a total of 0.5 mg per hour.  Overall, the patient tolerated the procedure very well.  There was no immediate complication.  IMPRESSION:  1.  Inferior venacavagram demonstrates patency of the renal veins and IVC above the level of the filter.  2. Initial limited bilateral lower extremity pharmacomechanical thrombolysis.  3.  Initiation of bilateral lower extremity venous lysis using bilateral multi side-hole infusion catheters.  The patient will lyse for 12-24 hours and return interventional radiology for venogram and potential adjunctive interventions tomorrow.  Signed,  Sterling Big, MD [facility] Rad[facility]   Original Report Authenticated By: Malachy Moan, M.D.   Dg Chest Port 1 View  05/13/2013   CLINICAL DATA:  Shortness of breath. Atelectasis.  EXAM: PORTABLE CHEST - 1 VIEW  COMPARISON:  04/29/2013  FINDINGS: Slight improvement in aeration and lung volumes. No confluent opacities currently. Heart is upper limits normal in size. No effusions. No acute bony abnormality.  IMPRESSION: No active disease.   Electronically Signed   By: Charlett Nose M.D.   On: 05/13/2013 05:27   Dg Knee Left Port  05/16/2013   CLINICAL DATA:  Pain and swelling  EXAM: PORTABLE LEFT KNEE - 1-2 VIEW  COMPARISON:  None.  FINDINGS: There is no evidence of fracture or dislocation. Trace suprapatellar fluid identified. There is no focal bone abnormality. Soft tissues are remarkable for mild edema diffusely. Tricompartmental degenerative change noted.  IMPRESSION: Degenerative change without acute osseous  abnormality.   Electronically Signed   By: Christiana Pellant M.D.   On: 05/16/2013 21:47   Ct Angio Abd/pel W/ And/or W/o  05/10/2013   *RADIOLOGY REPORT*  Clinical Data: Extensive bilateral DVT, evaluate for caval thrombus.  CT ANGIOGRAPHY ABDOMEN AND PELVIS WITH CONTRAST AND WITHOUT CONTRAST  Technique:  Multidetector CT imaging of the abdomen and pelvis was performed following the standard protocol during bolus administration of intravenous contrast.  Comparison: Prior CT abdomen/pelvis 04/30/2013  Findings:  Lower Chest:  Mild dependent atelectasis versus scarring in the posterior left lower lobe.  The lung bases are otherwise clear. Visualized heart within normal limits for size.  No pericardial effusion.  Unremarkable distal thoracic esophagus.  Abdomen: Unremarkable CT appearance of the stomach, duodenum, spleen, pancreas and left adrenal gland.  Dystrophic calcification in the right adrenal gland is nonspecific, but stable.  Normal hepatic contours and morphology.  No focal hepatic mass. Gallbladder is unremarkable. No intra or extrahepatic biliary ductal dilatation.  Stable 7.7 cm simple cyst exophytic from the upper pole of the left kidney.  Stable low attenuation left subcapsular fluid collection, likely a liquefied subcapsular hematoma.  No hydronephrosis. Bilateral double J ureteral stents are in place.  There are multiple small parapelvic cysts in the right kidney.  Small exophytic renal cortical cyst on the right.  Symmetric bilateral renal perfusion.  No bowel obstruction or focal bowel wall thickening.  No free fluid or suspicious adenopathy.  Pelvis: The bladder is decompressed.  There is a suprapubic catheter as well as to double-J ureteral stents.  Surgical changes of prior TURP.  No free fluid or suspicious adenopathy.  Bones: No acute fracture or aggressive appearing lytic or blastic osseous lesion.  Vascular: Extensive thrombus beginning in the inferior vena cava contained within the IVC  filter and extending through out of the bilateral iliac and femoral venous systems.  Thrombus likely extends into the superficial great saphenous veins bilaterally as well.  No significant provocation superior to the filter.  The renal veins are patent bilaterally.  Mild scattered atherosclerotic vascular calcifications.  No acute arterial abnormality.  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  No evidence of propagation more centrally within the inferior vena cava.  The bilateral renal veins remain widely patent.  Additional ancillary findings as above without significant interval change.   Original Report Authenticated By: Malachy Moan, M.D.   Ir Infusion Thrombol Venous Initial (ms)  05/11/2013   *RADIOLOGY REPORT*  PRIOR ULTRASOUND GUIDED VASCULAR ACCESS; IR INFERIOR VENA CAVA GRAM; IR THROMBOLYSIS/THROMBECTOMY BILATERAL LOWER EXTREMITIES; IR INITIATION OF VENOUS LYSIS INITIAL DAY  Date: 05/11/2013  Clinical History: 67 year old male with complicated medical history.  He had a TURP and left double-J ureteral stent placement procedure done in the end of August which was complicated by postoperative PE and subsequent postoperative hemorrhage following initiation of anticoagulation.  Hemostasis was obtained and IVC filter was placed.  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  He is now nearly 4 weeks post operative and showing in significant improvement with systemic heparinization.  He presents for attempted pharmacomechanical catheter directed thrombolysis/thrombectomy.  Procedures Performed: 1. Ultrasound-guided puncture of the left popliteal vein 2.  Catheterization of the inferior vena cava above the IVC filter with vena cava gram 3.  Power pulse spray infusion of 6 mg of TPA from the popliteal  access to the infrarenal IVC 4.  Placement of a 90 cm total length 50 cm infusion length multi side-hole infusion catheter 5.  Ultrasound-guided puncture of the right popliteal vein 6.  Catheterization of the inferior vena cava above the IVC filter 7.  Power pulse spray infusion of 6 mg of TPA from the right popliteal access into the infrarenal IVC 8.  Placement of a 130 cm total length 50 cm infusion length multi side-hole infusion catheter 9.  Initiation of venous thrombolysis  Interventional Radiologist:  Sterling Big, MD  Sedation: Moderate (conscious) sedation was used.  Four mg Versed, 100 mcg Fentanyl were administered intravenously.  The patient's vital signs were monitored continuously by radiology nursing throughout the procedure.  Sedation Time: 60 minutes  Fluoroscopy time: 15 minutes 6 seconds  Contrast volume: 10 ml Omnipaque-300  Intravenous medications:  A total of 12 mg TPA was administered into the thrombus  PROCEDURE/FINDINGS:   Informed consent was obtained from the patient following explanation of the procedure, risks, benefits and alternatives. The patient understands, agrees and consents  for the procedure. All questions were addressed. A time out was performed.  Maximal barrier sterile technique utilized including caps, mask, sterile gowns, sterile gloves, large sterile drape, hand hygiene, and betadine skin prep.  The left popliteal fossa was interrogated with ultrasound.  The popliteal vein is expanded and completely thrombosed.  Local anesthesia was attained by infiltration with 1% lidocaine.  Under real time sonographic guidance, the thrombosed vein was punctured with a 21-gauge micropuncture needle.  Images obtained stored for the medical record.  The transitional micro sheath was upsized to a 6-French working vascular sheath over a short Amplatz wire.  Using a stiff Glidewire and angled catheter, the catheter was used to navigate into the infrarenal IVC just beyond the nose cone  of the IVC filter.  An inferior vena cava gram was performed limiting the amount of intravenous contrast used.  The left renal vein and inferior vena cava are patent.  Inflow from the right renal vein is also noted indicating patency.  The wire was advanced into the suprarenal IVC.  The 6-French 90 cm length AngioJet device was then used to power pulse spray a total of 6 mg of TPA and 125 ml of saline from the popliteal vein throughout the left lower extremity and into the IVC filter.  A 90 cm total length, 50 cm infusion length Unifuse infusion catheter was then positioned over the wire so that the proximal side hole was just within the filter, and the more proximal side hole in the mid thigh.  The sheath and catheter were secured to the skin with post silk suture.  The right popliteal fossa was interrogated with ultrasound.  Again, the popliteal vein was found be expanded completely thrombosed. Local anesthesia was attained by infiltration with 1% lidocaine. Under real time sonographic guidance, the vein was punctured using a 21-gauge micropuncture needle.  Images obtained stored for the medical record.  A 6-French working vascular sheath was then placed over a stiff Amplatz wire after transitioning from the transitional micro sheath.  The wire was advanced into the suprarenal IVC.  Care was taken to navigate the wire through the right aspect of the IVC and the filter to maximize lytic dispersal.  The 6-French AngioJet was then used to power pulse spray a total of 6 mg of TPA and 125 ml of saline from the popliteal artery into the IVC.  A 130 cm total length 50 cm infusion length Unifuse multi side-hole infusion catheter was then placed in identical position to the contralateral side.  Initial venous lysis was initiated at 0.25 mg connector place through each catheter for a total of 0.5 mg per hour.  Overall, the patient tolerated the procedure very well.  There was no immediate complication.  IMPRESSION:  1.   Inferior venacavagram demonstrates patency of the renal veins and IVC above the level of the filter.  2. Initial limited bilateral lower extremity pharmacomechanical thrombolysis.  3.  Initiation of bilateral lower extremity venous lysis using bilateral multi side-hole infusion catheters.  The patient will lyse for 12-24 hours and return interventional radiology for venogram and potential adjunctive interventions tomorrow.  Signed,  Sterling Big, MD [facility] Radiology   Original Report Authenticated By: Malachy Moan, M.D.   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 Status post initiation of catheter directed venous thrombolytic therapy via bilateral popliteal access yesterday.  EXAM: 1. Followup  angiography of bilateral lower extremities and IVC on completion of thrombolytic therapy.  2.  Venous angioplasty of right external and common iliac veins  FLUOROSCOPY TIME:  FLUOROSCOPY TIME  3 min and 12 seconds.  PROCEDURE: In a prone position, both pre-existing popliteal venous sheaths and infusion catheters were prepped with Betadine. Venography was performed of both lower extremities with injection of bilateral popliteal sheathes as well as both indwelling infusion catheters to assess venous patency from the level of the popliteal veins bilaterally through the iliac veins and inferior vena cava, including at the level of the IVC filter.  Right common iliac and external iliac veins were dilated with a 10 mm x 4 cm Conquest balloon. Additional venography was performed at the level of the common femoral veins bilaterally via 5 French catheters. Upon completion of the procedure, IV heparin was turned off and manual compression was held at both popliteal venous access sites after sheath removal. Thrombolytic therapy was discontinued.  Complications: None  FINDINGS: Venography  demonstrates dramatically improved patency of bilateral popliteal and femoral veins in both lower extremities with antegrade flow reestablished and no significant residual thrombus present. A minimal amount of adherent mural thrombus was present in the right femoral vein. Left-sided iliac veins showed good patency and flow with no significant narrowing identified.  Right-sided iliac veins showed less rapid antegrade flow with some mural thrombus and stenosis present at the level of the common and external iliac veins. Iliac venous patency improved significantly after 10 mm balloon angioplasty with excellent antegrade flow present.  Visualized segment of the inferior vena cava shows no significant residual thrombus with flow present, including at the level of the indwelling IVC filter. There may be a minimal amount of residual mural thrombus remaining in the lower IVC which does not appear to be impeding flow.  IMPRESSION: Dramatic improvement in patency of bilateral lower extremity deep veins, iliac veins and the IVC. There was some sluggish flow associated with an area of stenosis of the right external and common iliac veins. This segment was treated with 10 mm balloon angioplasty with improved result.  PREPERATION AND PROCEDURE MEDICATIONS: 4.0 mg IV Versed and and 200 mcg IV fentanyl.  Sedation time: 45 min   Electronically Signed   By: Irish Lack   On: 05/12/2013 15:57    CBC  Recent Labs Lab 05/24/13 0540 05/24/13 1659 05/25/13 0550 05/26/13 0736 05/27/13 0615 05/28/13 1150  WBC 5.2  --  6.5 5.3 4.8 4.5  HGB 7.4* 8.0* 7.3* 7.4* 7.6* 7.9*  HCT 22.9* 24.7* 22.1* 23.6* 24.2* 25.0*  PLT 272  --  263 277 286 279  MCV 87.4  --  87.7 91.1 91.0 91.6  MCH 28.2  --  29.0 28.6 28.6 28.9  MCHC 32.3  --  33.0 31.4 31.4 31.6  RDW 16.2*  --  16.0* 16.8* 17.1* 17.0*    Chemistries   Recent Labs Lab 05/24/13 0540 05/25/13 0550 05/27/13 0615  NA 136 134* 137  K 4.4 4.4 4.3  CL 105 104 106   CO2 26 24 26   GLUCOSE 113* 129* 98  BUN 15 17 14   CREATININE 1.10 1.12 0.98  CALCIUM 7.5* 8.0* 7.8*   ------------------------------------------------------------------------------------------------------------------ estimated creatinine clearance is 95.5 ml/min (by C-G formula based on Cr of 0.98). ------------------------------------------------------------------------------------------------------------------ No results found for this basename: HGBA1C,  in the last 72 hours ------------------------------------------------------------------------------------------------------------------ No results found for this basename: CHOL, HDL, LDLCALC, TRIG, CHOLHDL, LDLDIRECT,  in the last 72 hours ------------------------------------------------------------------------------------------------------------------ No  results found for this basename: TSH, T4TOTAL, FREET3, T3FREE, THYROIDAB,  in the last 72 hours ------------------------------------------------------------------------------------------------------------------ No results found for this basename: VITAMINB12, FOLATE, FERRITIN, TIBC, IRON, RETICCTPCT,  in the last 72 hours  Coagulation profile No results found for this basename: INR, PROTIME,  in the last 168 hours  No results found for this basename: DDIMER,  in the last 72 hours  Cardiac Enzymes No results found for this basename: CK, CKMB, TROPONINI, MYOGLOBIN,  in the last 168 hours ------------------------------------------------------------------------------------------------------------------ No components found with this basename: POCBNP,     Bobetta Korf, MD, FACP, FHM. [facility] Pager [phone]  If 7PM-7AM, please contact night-coverage [email or web address] Password [email or web address] 05/28/2013, 5:13 PM

## 2013-05-29 ENCOUNTER — Telehealth: Payer: Self-pay | Admitting: Vascular Surgery

## 2013-05-29 ENCOUNTER — Other Ambulatory Visit: Payer: Self-pay | Admitting: Radiology

## 2013-05-29 DIAGNOSIS — I82220 Acute embolism and thrombosis of inferior vena cava: Secondary | ICD-10-CM

## 2013-05-29 DIAGNOSIS — I82403 Acute embolism and thrombosis of unspecified deep veins of lower extremity, bilateral: Secondary | ICD-10-CM

## 2013-05-29 LAB — CBC
Hemoglobin: 8.2 g/dL — ABNORMAL LOW (ref 13.0–17.0)
MCHC: 31.2 g/dL (ref 30.0–36.0)
MCV: 92 fL (ref 78.0–100.0)
Platelets: 295 10*3/uL (ref 150–400)
RBC: 2.86 MIL/uL — ABNORMAL LOW (ref 4.22–5.81)
RDW: 17 % — ABNORMAL HIGH (ref 11.5–15.5)

## 2013-05-29 MED ORDER — CYANOCOBALAMIN 1000 MCG PO TABS: 1000.0000 ug | ORAL_TABLET | Freq: Every day | ORAL | Status: AC

## 2013-05-29 MED ORDER — METRONIDAZOLE 500 MG PO TABS: 500.0000 mg | ORAL_TABLET | Freq: Three times a day (TID) | ORAL | Status: DC

## 2013-05-29 MED ORDER — RIVAROXABAN 15 MG PO TABS: 15.0000 mg | ORAL_TABLET | Freq: Two times a day (BID) | ORAL | Status: DC

## 2013-05-29 MED ORDER — ALPRAZOLAM 0.25 MG PO TABS: 0.2500 mg | ORAL_TABLET | Freq: Three times a day (TID) | ORAL | Status: DC | PRN

## 2013-05-29 MED ORDER — RIVAROXABAN 20 MG PO TABS: 20.0000 mg | ORAL_TABLET | Freq: Every day | ORAL | Status: DC

## 2013-05-29 MED ORDER — HYDROCORTISONE 2.5 % RE CREA: TOPICAL_CREAM | Freq: Two times a day (BID) | RECTAL | Status: DC

## 2013-05-29 MED ORDER — FERROUS SULFATE 325 (65 FE) MG PO TABS: 325.0000 mg | ORAL_TABLET | Freq: Two times a day (BID) | ORAL | Status: DC

## 2013-05-29 MED ORDER — OXYCODONE HCL 5 MG PO TABS: 5.0000 mg | ORAL_TABLET | Freq: Four times a day (QID) | ORAL | Status: DC | PRN

## 2013-05-29 NOTE — Progress Notes (Signed)
Physical Therapy Treatment Patient Details Name: Jack [name] MRN: [id number] DOB: 07/05/1946 Today's Date: 05/29/2013 Time: 4098-1191 PT Time Calculation (min): 25 min  PT Assessment / Plan / Recommendation  History of Present Illness 24 M underwent TURP and L ureteral stent 8/29. Admitted to [facility] 9/02 with acute PE and anticoagulation initiated. Hospitalization c/b hematuria, progressive renal linsufficiency and anemia requiring transfusion. Bladder irrigations initiated and CT scan abdomen performed 9/03 revealed L renal fluid subcapsular fluid collection thought to represent hematoma. Transferred to [facility] 9/05 for impending need for HD and placement of IVC filter. Currently has a suprapubic catheter with CBI.  Pt with bil LE fasciotomies 10/1 and 10/4 with RLE closed 10/6 and LLE closed 10/8. Pt in bed 10/1-10/9   PT Comments   Pt is highly motivated and continues to slowly progress with therapy. Pt able to perform pre gt activities standing EOB but fatigued quickly and has difficulty weight shifting to Lt and advancing Rt LE due to decr strength. [facility]; will cont to f/u with pt while in acute setting per POC.   Follow Up Recommendations  SNF;Supervision/Assistance - 24 hour     Does the patient have the potential to tolerate intense rehabilitation     Barriers to Discharge        Equipment Recommendations  3in1 (PT)    Recommendations for Other Services    Frequency Min 2X/week   Progress towards PT Goals Progress towards PT goals: Progressing toward goals  Plan Current plan remains appropriate    Precautions / Restrictions Precautions Precautions: Fall Precaution Comments: foley Restrictions Weight Bearing Restrictions: No   Pertinent Vitals/Pain No c/o pain    Mobility  Bed Mobility Bed Mobility: Supine to Sit;Sitting - Scoot to Edge of Bed Supine to Sit: 5: Supervision;HOB elevated;With rails Sitting - Scoot to Edge of Bed: 5:  Supervision Details for Bed Mobility Assistance: pt relies heavily on HOB being elevated and handrails  Transfers Transfers: Sit to Stand;Stand to Sit;Stand Pivot Transfers Sit to Stand: 1: +2 Total assist;From bed;With upper extremity assist;From elevated surface Sit to Stand: Patient Percentage: 70% Stand to Sit: 1: +2 Total assist;To bed;To chair/3-in-1;With upper extremity assist;With armrests Stand to Sit: Patient Percentage: 70% Stand Pivot Transfers: 1: +2 Total assist;With armrests;From elevated surface Stand Pivot Transfers: Patient Percentage: 50% Details for Transfer Assistance: pt stood x 3 with 2+ (A); pt required bed being elevated and cues for hand placement and sequencing; pt performed pre gt activities for weightshifting at EOB and would fatigue quickly requiring return to bed prior to performing SPT to chair; pt had difficulty extending LEs and stands with bil LEs flexed due to decr strength  Ambulation/Gait Ambulation/Gait Assistance: Not tested (comment) Stairs: No Wheelchair Mobility Wheelchair Mobility: No    Exercises General Exercises - Lower Extremity Ankle Circles/Pumps: AROM;15 reps;Seated Long Arc Quad: AROM;15 reps;Both;Seated Hip ABduction/ADduction: AROM;Both;10 reps;Seated Hip Flexion/Marching: AROM;Both;10 reps;Seated Other Exercises Other Exercises: cues for controlled breathing during exercises    PT Diagnosis:    PT Problem List:   PT Treatment Interventions:     PT Goals (current goals can now be found in the care plan section) Acute Rehab PT Goals Patient Stated Goal: to get back to moving better  PT Goal Formulation: With patient Time For Goal Achievement: 06/05/13 Potential to Achieve Goals: Fair  Visit Information  Last PT Received On: 05/29/13 Assistance Needed: +2 (for sit to stand and amb ) History of Present Illness: 66  M underwent TURP and L ureteral stent 8/29. Admitted to [facility] 9/02 with acute PE and anticoagulation  initiated. Hospitalization c/b hematuria, progressive renal linsufficiency and anemia requiring transfusion. Bladder irrigations initiated and CT scan abdomen performed 9/03 revealed L renal fluid subcapsular fluid collection thought to represent hematoma. Transferred to [facility] 9/05 for impending need for HD and placement of IVC filter. Currently has a suprapubic catheter with CBI.  Pt with bil LE fasciotomies 10/1 and 10/4 with RLE closed 10/6 and LLE closed 10/8. Pt in bed 10/1-10/9    Subjective Data  Subjective: pt very motivated and stated; "im going to get moving better today, it may take me awhile but i will"  Patient Stated Goal: to get back to moving better    Cognition  Cognition Arousal/Alertness: Awake/alert Behavior During Therapy: WFL for tasks assessed/performed Overall Cognitive Status: Within Functional Limits for tasks assessed    Balance  Balance Balance Assessed: Yes Dynamic Standing Balance Dynamic Standing - Balance Support: Bilateral upper extremity supported Dynamic Standing - Level of Assistance: 1: +2 Total assist Dynamic Standing - Balance Activities: Forward lean/weight shifting;Lateral lean/weight shifting Dynamic Standing - Comments: pt performed pregt activites while standing EOB; performed lateral weight shifts and forward stepping activities to simulate gt; pt unable to advance Rt LE but was able to advance Lt with forward stepping  End of Session PT - End of Session Equipment Utilized During Treatment: Gait belt Activity Tolerance: Patient tolerated treatment well Patient left: in chair;with call bell/phone within reach Nurse Communication: Mobility status   GP     Donell Sievert, [place] [phone] 05/29/2013, 9:42 AM

## 2013-05-29 NOTE — Progress Notes (Signed)
Patient discharged to [facility] in [facility]. Patient discharged by ambulance, wife at bedside. Report was called prior to discharge. Patient's belongings were discharged with patient. Patient was stable upon discharge.

## 2013-05-29 NOTE — Progress Notes (Signed)
Occupational Therapy Treatment Patient Details Name: Jack [name] MRN: [id number] DOB: 05-30-1946 Today's Date: 05/29/2013 Time: 1240-1315 OT Time Calculation (min): 35 min  OT Assessment / Plan / Recommendation  History of present illness Jack [name] underwent TURP and L ureteral stent 8/29. Admitted to [facility] 9/02 with acute PE and anticoagulation initiated. Hospitalization c/b hematuria, progressive renal linsufficiency and anemia requiring transfusion. Bladder irrigations initiated and CT scan abdomen performed 9/03 revealed L renal fluid subcapsular fluid collection thought to represent hematoma. Transferred to [facility] 9/05 for impending need for HD and placement of IVC filter. Currently has a suprapubic catheter with CBI.  Pt with bil LE fasciotomies 10/1 and 10/4 with RLE closed 10/6 and LLE closed 10/8. Pt in bed 10/1-10/9   OT comments    Follow Up Recommendations  SNF    Barriers to Discharge       Equipment Recommendations       Recommendations for Other Services    Frequency Min 2X/week   Progress towards OT Goals Progress towards OT goals: Progressing toward goals  Plan Discharge plan remains appropriate    Precautions / Restrictions Precautions Precautions: Fall Precaution Comments: foley Restrictions Weight Bearing Restrictions: No   Pertinent Vitals/Pain VSS    ADL  Toilet Transfer: Moderate assistance Toilet Transfer Method: Sit to stand Toilet Transfer Equipment: Bedside commode Toileting - Clothing Manipulation and Hygiene: +1 Total assistance Where Assessed - Toileting Clothing Manipulation and Hygiene: Sit to stand from 3-in-1 or toilet ADL Comments: Performed B UE exercise with Level 3 tband 10 reps B elbows and shoulders all areas.    OT Diagnosis:    OT Problem List:   OT Treatment Interventions:     OT Goals(current goals can now be found in the care plan section) Acute Rehab OT Goals Patient Stated Goal: to get back to moving better   Visit  Information  Last OT Received On: 05/29/13 Assistance Needed: +2 History of Present Illness: Jack [name] underwent TURP and L ureteral stent 8/29. Admitted to [facility] 9/02 with acute PE and anticoagulation initiated. Hospitalization c/b hematuria, progressive renal linsufficiency and anemia requiring transfusion. Bladder irrigations initiated and CT scan abdomen performed 9/03 revealed L renal fluid subcapsular fluid collection thought to represent hematoma. Transferred to [facility] 9/05 for impending need for HD and placement of IVC filter. Currently has a suprapubic catheter with CBI.  Pt with bil LE fasciotomies 10/1 and 10/4 with RLE closed 10/6 and LLE closed 10/8. Pt in bed 10/1-10/9    Subjective Data      Prior Functioning       Cognition  Cognition Arousal/Alertness: Awake/alert Behavior During Therapy: WFL for tasks assessed/performed Overall Cognitive Status: Within Functional Limits for tasks assessed    Mobility  Bed Mobility Bed Mobility: Not assessed Transfers Sit to Stand: 3: Mod assist;With upper extremity assist;From chair/3-in-1 Stand to Sit: 4: Min assist;With upper extremity assist;To chair/3-in-1    Exercises      Balance     End of Session OT - End of Session Activity Tolerance: Patient tolerated treatment well Patient left: in chair;with family/visitor present;with call bell/phone within reach  GO     Evern Bio 05/29/2013, 2:04 PM [phone]

## 2013-05-29 NOTE — Telephone Encounter (Addendum)
Message copied by Fredrich Birks on Thu May 29, 2013  2:05 PM ------      Message from: Phillips Odor      Created: Thu May 29, 2013 12:47 PM                   ----- Message -----         From: Fransisco Hertz, MD         Sent: 05/29/2013  10:46 AM           To: Melene Plan, RN            Jack [name]      [id number]      1945-09-19            Pt needs follow up in 2 weeks.  He will be in a SNF on D/C ------  05/29/13: unable to reach pt, pt home phone is a constant busy signal, mailed letter, dpm

## 2013-05-29 NOTE — Discharge Summary (Addendum)
Physician Discharge Summary  ROCKY GLADDEN ZOX:[id number] DOB: February 22, 1946 DOA: 04/30/2013  PCP: Simone Curia, MD  Admit date: 04/30/2013 Discharge date: 05/29/2013  Time spent: Greater than 30 minutes  Recommendations for Outpatient Follow-up:  1. MD at SNF: Followup in 3-5 days from hospital discharge. 2. Follow up CBC twice a week. Next lab draw on 05/31/13 3. Dr. Leonides Sake, Vascular surgeon, in 10 days for removal of bilateral lower extremity surgical site suture/staple. 4. Dr. Bjorn Pippin, Urology in one week to change suprapubic catheter. 5. Interventional Radiology at [facility]: To address status of IVC filter. 6. Dr. Simone Curia, PCP  Discharge Diagnoses:  Principal Problem:   Acute renal failure Active Problems:   Chronic renal insufficiency   Sepsis   Hyperkalemia   DVT, bilateral lower limbs   GI bleed   Pulmonary embolism 9/2   Discharge Condition: Improved & Stable  Diet recommendation: Heart healthy diet  Filed Weights   05/26/13 2123 05/27/13 2141 05/28/13 2100  Weight: 126.962 kg (279 lb 14.4 oz) 124.603 kg (274 lb 11.2 oz) 126.508 kg (278 lb 14.4 oz)    History of present illness:  67 y.o. male with complicated medical history including a recent stay at [facility] (04/18/13-04/24/13) for PE, S/P TURP 8/29 who required subsequent bilateral renal stents for obstruction due to hemorrhagic complications of anticoagulation. Pt was seen at [facility] 9/2 for V/Q showing likely PE. PT had CT showing L renal hematoma.9/5 - transferred to [facility] to PCCM service - noted to have large clot in bladder w/ B hydronephrosis 9/5 - IVC filter placement. He returned to [facility] on 04/30/13 were lab work demonstrated creatinine of 6, WBC 30 7K, potassium 5.8, sodium 127, tachycardic and mildly hypotensive. VQ scan repeated was negative for PE. He was transferred to [facility] for further management.  SIGNIFICANT EVENTS / STUDIES:  8/29 - TURP w/ L ureteral stent  [facility]  9/02 - admit [facility] w/ V/Q "high probability" for PE >> anticoag >> severe hematuria + anemia + renal failure  9/3 - CT abdom reveals large L renal capsular hematoma  9/5 - transferred to [facility] to PCCM service - noted to have large clot in bladder w/ B hydronephrosis  9/5 - IVC filter placement  9/6 - CVC placed. B ureteral stents placed - open evacuation of clot from bladder- suprapubic tube placed  9/7- venous duplex lower extremity - NO DVT  9/17 - Renal US - Mild right hydronephrosis.  9/21-venous duplex lower extremity- Bilateral: Positive for DVT in the common femoral, profunda, femoral, popliteal, and posterior tibial veins with superficial thrombus in the greater and lesser saphenous veins.  9/22 - IV heparin initiated for DVTs (anticoagulation had been on hold due to renal hematoma)  9/27 CTA abd/pelv - IVC thrombus extending from filter throughout the visualized bilateral lower extremity veins.  9/28-Bilateral popliteal venous acc with initiation of bilateral LE and IVC thrombolysis, popliteal sheaths  9/29- IR inferior venacavagram, thrombolysis/thrombectomy bilateral lower extremities showing improved patency, IVC patent. Advised d/c lytic therapy, continue heparin per pharmacy  9/30 - CT abd/pelvis showed stable L renal hematoma. Small B pleural effusions  10/1- Acute hematoma rt leg below knee.Fasciotomy performed on RLE for compartment syndrome, then 8 hrs later hep restarted  10/2 - patient waiting for stepdown bed. Continued to be followed by vascular surgery, neg 3 liters with improved renal fxn  10/11 - CT abd/pelvis - decreased L renal hematoma, slightly increasing mild R hydronephrosis. Small  filling defects within right renal calices, could represent clots vs.infetion vs calculi.  10/15- pink urine, HCT unchanged  Hospital Course:   S/p transurethral resection of prostate with left ureteroscopic stone manipulation 04/13/13 complicated by  perinephric hematoma with bilateral hydronephrosis  - underwent urgent cystoscopy 04/18/2013 with bilateral stent placement and bladder evacuation with suprapubic tube placement  -CT showed decreasing size of hematoma  -Urology consulted and replaced suprapubic catheter  -Collection bag has slightly blood tinged urine in bag but clear straw color urine in catheter tube itself  - Hb stable & improving - OP followup with urology in one week to change suprapubic catheter.  Bilateral lower extremity DVT / PE  -status post IVC filter 9/5 - given recent severe complications related to bleeding anticoagulation was not felt to be wise at the time of admission - further review of records revealed Dopplers from 9/7 which were negative for DVT - repeat dopplers this hospitalization confirmed extensive bilateral lower extremity DVTs - d/c'ed testosterone replacement  - due to fear of propagation of clot through IVC filter, heparin was subsequently re-intitiated on 9/22 for anticoag w/ closer monitoring -  - clot extended from lower extremities through IVC and involved filter- he underwent thrombolysis by IR- then developed compartment syndrome and was taken to OR on 10/1 right calf fasciotomy  - back to OR on 10/6 to close faciotomy right leg to close - left leg had fasciotomy on 10/4 with closure 10/8 - on heparin  - No longer on Heparin, foot pumps in place  - Xarelto was started on 10/14.  - Discussed with PCCM and appreciate followup note. Patient developed significant bleeding issues during this admission. However he also has significant risk of VTE-history of PE, recurrent DVT, IVC filter obstruction and compartment syndrome. At this time it is felt that the benefits of anticoagulation outweigh bleeding risks. Thereby, patient will be continued on Xarelto. As per PCCM recommendations, consider removal of IVC filter after 2-3 weeks of being on anticoagulation. However if he does develop significant bleeding  again or drop in his hemoglobin, we will be left with no choice but to DC anticoagulation indefinitely. Discussed with patient who verbalizes understanding.  - Discussed with IR: Dr. Richarda Overlie who will arrange outpatient followup in the IR clinic to make decisions regarding IVC filter.  B LE compartment syndrome due to bleeding/hematoma  -S/p B/L fasciotomies - Vasc Surgery has been following -  - Oxycodone Q6 routine with PRN IV Morphine to control pain  - Discussed with Dr. Johny Drilling who advised dressing changes and will arrange for outpatient followup.  C diff colitis  -started flagyl 10/3 for 2 wk course - diarrhea resolved. - Completes Flagyl course on 05/30/13  Acute renal failure/ CKD 3  -Resolved. (was likely due to prerenal vs ATN from Hypotension)  -Nephrology signed off.  -ultrasound and Ct abd/pelvis revealed no acute findings   Progressive anemia / Iron deficiency  -Multifactorial causes: significant recent acute loss plus decreased production  -Will continue Iron supplementation  -Hb 7.9, trending upward. Patient has received multiple transfusions during hospital course  -Vit B 12- low normal, will continue to supplement   Hemorrhoidal GI bleed  -bright red blood streaking in stools 9/25 - appears to be from hemorrhoids - no further GI bleeding since 10/3- mucous stools . Continue rectal hydrocortisone suppositories for another week and then change to when necessary   Hypotension -  BP has now stabilized   Hyperkalemia  resolved   Consultations:  Nephrology  Vascular Surgery  IR  Urology  PCCM  Procedures:  Please see above    Discharge Exam:  Complaints:  Denies complaints. Does not complain of much pain in his legs.   Filed Vitals:   05/28/13 1731 05/28/13 2100 05/29/13 0551 05/29/13 0958  BP: 120/57 132/60 142/63 129/60  Pulse: 78 86 72 78  Temp: 98 F (36.7 C) 97.8 F (36.6 C) 97.7 F (36.5 C) 98.2 F (36.8 C)  TempSrc: Oral Oral Oral Oral   Resp: 18 18 20 20   Height:      Weight:  126.508 kg (278 lb 14.4 oz)    SpO2: 98% 98% 99% 98%    General: Well developed, well nourished, NAD, appears stated age. Sitting comfortably on a chair.  Cardiovascular: S1 S2 auscultated, no rubs, murmurs or gallops. Regular rate and rhythm.  Respiratory: Clear to auscultation bilaterally. No increased work of breathing.  Abdomen: Soft, obese, nondistended, nontender, + bowel sounds, suprapubic catheter in place the site looks clean and dry. Minimally blood-tinged urine in the bag.  Extremities: Lower extremities both are wrapped. Otherwise warm dry without cyanosis clubbing. Edema note in lower extremities bilaterally.  Neuro: AAOx3, cranial nerves grossly intact.    Discharge Instructions      Discharge Orders   Future Orders Complete By Expires   IR Radiologist Eval & Mgmt  06/12/2013 07/13/2014   Questions:     Preferred Imaging Location?:  [facility]   Reason for Exam (SYMPTOM  OR DIAGNOSIS REQUIRED):  4-6 week follow up with Dr Archer Asa; need B low extr DVT US   Call MD for:  difficulty breathing, headache or visual disturbances  As directed    Call MD for:  extreme fatigue  As directed    Call MD for:  persistant dizziness or light-headedness  As directed    Call MD for:  redness, tenderness, or signs of infection (pain, swelling, redness, odor or green/yellow discharge around incision site)  As directed    Call MD for:  severe uncontrolled pain  As directed    Call MD for:  temperature >100.4  As directed    Call MD for:  As directed    Comments:     Bleeding issues.   Diet - low sodium heart healthy  As directed    Discharge instructions  As directed    Comments:     Apply ABD pads to surgical incision sites to bilateral lower extremities and cover with ACE wrapping, daily.   Increase activity slowly  As directed        Medication List    STOP taking these medications       ANDROGEL PUMP 20.25 MG/ACT  (1.62%) Gel  Generic drug:  Testosterone     docusate sodium 100 MG capsule  Commonly known as:  COLACE      TAKE these medications       ALPRAZolam 0.25 MG tablet  Commonly known as:  XANAX  Take 1 tablet (0.25 mg total) by mouth 3 (three) times daily as needed for anxiety.     cyanocobalamin 1000 MCG tablet  Take 1 tablet (1,000 mcg total) by mouth daily.     ferrous sulfate 325 (65 FE) MG tablet  Take 1 tablet (325 mg total) by mouth 2 (two) times daily with a meal.     hydrocortisone 2.5 % rectal cream  Commonly known as:  ANUSOL-HC  Place rectally 2 (two) times daily.     metroNIDAZOLE  500 MG tablet  Commonly known as:  FLAGYL  Take 1 tablet (500 mg total) by mouth every 8 (eight) hours. Discontinue after 05/30/2013 doses.     oxyCODONE 5 MG immediate release tablet  Commonly known as:  Oxy IR/ROXICODONE  Take 1 tablet (5 mg total) by mouth every 6 (six) hours as needed for pain.     Rivaroxaban 15 MG Tabs tablet  Commonly known as:  XARELTO  Take 1 tablet (15 mg total) by mouth 2 (two) times daily with a meal. 39 more doses. Ends 06/17/2013.     Rivaroxaban 20 MG Tabs tablet  Commonly known as:  XARELTO  Take 1 tablet (20 mg total) by mouth daily with supper. Start first dose on Wed 06/18/13 at 1700. This has to be started after completing the twice daily dose.  Start taking on:  06/18/2013       Follow-up Information   Follow up with Nilda Simmer, MD. Schedule an appointment as soon as possible for a visit in 10 days.   Specialty:  Vascular Surgery   Contact information:   [address] [place] [address] [phone]       Follow up with Anner Crete, MD. Schedule an appointment as soon as possible for a visit in 1 week.   Specialty:  Urology   Contact information:   [address] [place] [address] [phone]        The results of significant diagnostics from this hospitalization (including imaging, microbiology, ancillary  and laboratory) are listed below for reference.    Significant Diagnostic Studies: Ct Abdomen Pelvis W Wo Contrast  05/24/2013   CLINICAL DATA:  67 year old male with hematuria. Patient with bilateral urinary stents and subcapsular hematoma. Marland Kitchen  EXAM: CT ABDOMEN AND PELVIS WITHOUT AND WITH CONTRAST  TECHNIQUE: Multidetector CT imaging of the abdomen and pelvis was performed without contrast material in one or both body regions, followed by contrast material(s) and further sections in one or both body regions. The patient was pre-medicated by the clinician due to contrast allergy history.  CONTRAST:  OMNIPAQUE IOHEXOL 300 MG/ML  SOLN  COMPARISON:  05/13/2013 and prior CTs  FINDINGS: Tiny bilateral pleural effusions are noted.  The left subcapsular renal hematoma has decreased in size, now measuring 10 mm in thickness, previously 16 mm.  Bilateral renal atrophy, moderate to severe renal cortical thinning, left renal cyst, nonobstructing left renal calculi and bilateral urinary stents are unchanged. The urinary stents are unchanged in appearance with tips in the bladder and renal collecting systems.  Mild right hydronephrosis is slightly increased since the prior study. On the delayed images, small filling defects within the right renal calices are noted and may represent small clots, sloughed papilla (papillary necrosis, but not identified on the left side), atypical infection or less likely non opaque calculi. Other etiologies are less likely.  Suprapubic catheter and thick walled bladder are unchanged.  An infrarenal IVC filter is again identified.  The liver, spleen, pancreas, gallbladder and adrenal glands are within normal limits.  No free fluid, enlarged lymph nodes, biliary dilatation or abdominal aortic aneurysm noted.  The bowel, bladder and appendix are unremarkable. There is no evidence of bowel obstruction, abscess or pneumoperitoneum.  No acute or suspicious bony abnormalities are identified.   IMPRESSION: Decreased left subcapsular renal hematoma, now measuring 10 mm, previously 16 mm.  Slightly increasing mild right hydronephrosis. Small filling defects within the right renal calices now noted and differential includes small  clots, atypical/fungal infection or less likely non opaque calculi. Uroepithelial neoplasm is considered less likely.  Unchanged bilateral renal atrophy, nonobstructing left renal calculi, bilateral urinary stents and thick-walled bladder.   Electronically Signed   By: Laveda Abbe M.D.   On: 05/24/2013 16:43   Ct Abdomen Pelvis Wo Contrast  05/13/2013   *RADIOLOGY REPORT*  Clinical Data: reassess renal hematomas  CT ABDOMEN AND PELVIS WITHOUT CONTRAST  Technique:  Multidetector CT imaging of the abdomen and pelvis was performed following the standard protocol without intravenous contrast.  Comparison: 05/10/13  Findings: Slight increase in small bilateral pleural effusions.  Liver, gallbladder, spleen, pancreas, and adrenal glands normal and unchanged, except for incidental punctate calcification right adrenal gland.  Bilateral ureteral stents unchanged.  16 mm thick  subcapsular hematoma left kidney stable.  Large left upper pole cyst stable. Small right exophytic mid pole cyst stable.  Suprapubic bladder catheter unchanged.  IVC filter stable.  Bowel remains unremarkable.  IMPRESSION: Stable subcapsular left renal hematoma.Small bilateral pleural effusions, mildly increased.   Original Report Authenticated By: Esperanza Heir, M.D.   Ct Abdomen Pelvis Wo Contrast  04/30/2013   *RADIOLOGY REPORT*  Clinical Data:   Acute renal failure.  CT ABDOMEN AND PELVIS WITHOUT CONTRAST  Technique:  Multidetector CT imaging of the abdomen and pelvis was performed following the standard protocol without intravenous contrast.  Comparison: CT scan from 04/16/2013  Findings: Focal atelectasis or pneumonia is seen in the posterior left base.  No focal abnormalities seen in the liver or spleen on  this study performed without intravenous contrast material.  The stomach is decompressed.  The duodenum, pancreas, gallbladder, and adrenal glands are unremarkable.  Stable appearance of the 7.8 cm cyst in the upper pole of the left kidney.  6 mm nonobstructing stone is seen in the lower pole of the left kidney.  The left sub capsular hematoma has decreased in the interval measuring about 1.7 cm in thickness today compared 2.4 cm previously.  The left double-J internal ureteral stent remains in place and is stable in position. There is been interval placement of a right double-J internal ureteral stent with the proximal loop formed and upper pole calix and the lower loop formed in the urinary bladder.  IVC filter again noted.  The no abdominal aortic aneurysm.  No free fluid or lymphadenopathy in the abdomen.  Imaging through the pelvis shows no apparent midline lower abdominal wound suggesting recent surgery.  Edema or inflammation is seen in the soft tissues of the extraperitoneal pelvic floor. Bilateral inguinal hernias contain only fat.  There is subcutaneous edema in the lower abdomen and pelvis bilaterally.  No colonic diverticulitis.  No evidence for bowel obstruction.  The patient is status post right hemicolectomy.  Urinary bladder is decompressed by a suprapubic tube.  Bone windows reveal no worrisome lytic or sclerotic osseous lesions.  IMPRESSION: Interval decrease in size of the left renal subcapsular fluid collection, suggesting resolving hematoma.  The patient has bilateral internal ureteral stents without overt hydronephrosis at this time.  There is some fullness of the right intrarenal collecting system which is stable to mildly increased in the interval.  Fullness of the left intrarenal collecting system seen previously has decreased in the interval.   Original Report Authenticated By: Kennith Center, M.D.   Ir Veno/ext/bi  05/11/2013   *RADIOLOGY REPORT*  PRIOR ULTRASOUND GUIDED VASCULAR ACCESS;  IR INFERIOR VENA CAVA GRAM; IR THROMBOLYSIS/THROMBECTOMY BILATERAL LOWER EXTREMITIES; IR INITIATION OF VENOUS LYSIS INITIAL DAY  Date: 05/11/2013  Clinical History: 67 year old male with complicated medical history.  He had a TURP and left double-J ureteral stent placement procedure done in the end of August which was complicated by postoperative PE and subsequent postoperative hemorrhage following initiation of anticoagulation.  Hemostasis was obtained and IVC filter was placed.  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  He is now nearly 4 weeks post operative and showing in significant improvement with systemic heparinization.  He presents for attempted pharmacomechanical catheter directed thrombolysis/thrombectomy.  Procedures Performed: 1. Ultrasound-guided puncture of the left popliteal vein 2.  Catheterization of the inferior vena cava above the IVC filter with vena cava gram 3.  Power pulse spray infusion of 6 mg of TPA from the popliteal access to the infrarenal IVC 4.  Placement of a 90 cm total length 50 cm infusion length multi side-hole infusion catheter 5.  Ultrasound-guided puncture of the right popliteal vein 6.  Catheterization of the inferior vena cava above the IVC filter 7.  Power pulse spray infusion of 6 mg of TPA from the right popliteal access into the infrarenal IVC 8.  Placement of a 130 cm total length 50 cm infusion length multi side-hole infusion catheter 9.  Initiation of venous thrombolysis  Interventional Radiologist:  Sterling Big, MD  Sedation: Moderate (conscious) sedation was used.  Four mg Versed, 100 mcg Fentanyl were administered intravenously.  The patient's vital signs were monitored continuously by radiology nursing throughout the procedure.  Sedation Time: 60 minutes  Fluoroscopy time: 15 minutes 6 seconds  Contrast volume: 10 ml  Omnipaque-300  Intravenous medications:  A total of 12 mg TPA was administered into the thrombus  PROCEDURE/FINDINGS:   Informed consent was obtained from the patient following explanation of the procedure, risks, benefits and alternatives. The patient understands, agrees and consents for the procedure. All questions were addressed. A time out was performed.  Maximal barrier sterile technique utilized including caps, mask, sterile gowns, sterile gloves, large sterile drape, hand hygiene, and betadine skin prep.  The left popliteal fossa was interrogated with ultrasound.  The popliteal vein is expanded and completely thrombosed.  Local anesthesia was attained by infiltration with 1% lidocaine.  Under real time sonographic guidance, the thrombosed vein was punctured with a 21-gauge micropuncture needle.  Images obtained stored for the medical record.  The transitional micro sheath was upsized to a 6-French working vascular sheath over a short Amplatz wire.  Using a stiff Glidewire and angled catheter, the catheter was used to navigate into the infrarenal IVC just beyond the nose cone of the IVC filter.  An inferior vena cava gram was performed limiting the amount of intravenous contrast used.  The left renal vein and inferior vena cava are patent.  Inflow from the right renal vein is also noted indicating patency.  The wire was advanced into the suprarenal IVC.  The 6-French 90 cm length AngioJet device was then used to power pulse spray a total of 6 mg of TPA and 125 ml of saline from the popliteal vein throughout the left lower extremity and into the IVC filter.  A 90 cm total length, 50 cm infusion length Unifuse infusion catheter was then positioned over the wire so that the proximal side hole was just within the filter, and the more proximal side hole in the mid thigh.  The sheath and catheter were secured to the skin with  post silk suture.  The right popliteal fossa was interrogated with ultrasound.  Again, the  popliteal vein was found be expanded completely thrombosed. Local anesthesia was attained by infiltration with 1% lidocaine. Under real time sonographic guidance, the vein was punctured using a 21-gauge micropuncture needle.  Images obtained stored for the medical record.  A 6-French working vascular sheath was then placed over a stiff Amplatz wire after transitioning from the transitional micro sheath.  The wire was advanced into the suprarenal IVC.  Care was taken to navigate the wire through the right aspect of the IVC and the filter to maximize lytic dispersal.  The 6-French AngioJet was then used to power pulse spray a total of 6 mg of TPA and 125 ml of saline from the popliteal artery into the IVC.  A 130 cm total length 50 cm infusion length Unifuse multi side-hole infusion catheter was then placed in identical position to the contralateral side.  Initial venous lysis was initiated at 0.25 mg connector place through each catheter for a total of 0.5 mg per hour.  Overall, the patient tolerated the procedure very well.  There was no immediate complication.  IMPRESSION:  1.  Inferior venacavagram demonstrates patency of the renal veins and IVC above the level of the filter.  2. Initial limited bilateral lower extremity pharmacomechanical thrombolysis.  3.  Initiation of bilateral lower extremity venous lysis using bilateral multi side-hole infusion catheters.  The patient will lyse for 12-24 hours and return interventional radiology for venogram and potential adjunctive interventions tomorrow.  Signed,  Sterling Big, MD [facility] Radiology   Original Report Authenticated By: Malachy Moan, M.D.   Ir Caffie Damme Ivc  05/11/2013   *RADIOLOGY REPORT*  PRIOR ULTRASOUND GUIDED VASCULAR ACCESS; IR INFERIOR VENA CAVA GRAM; IR THROMBOLYSIS/THROMBECTOMY BILATERAL LOWER EXTREMITIES; IR INITIATION OF VENOUS LYSIS INITIAL DAY  Date: 05/11/2013  Clinical History:  67 year old male with complicated medical history.  He had a TURP and left double-J ureteral stent placement procedure done in the end of August which was complicated by postoperative PE and subsequent postoperative hemorrhage following initiation of anticoagulation.  Hemostasis was obtained and IVC filter was placed.  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  He is now nearly 4 weeks post operative and showing in significant improvement with systemic heparinization.  He presents for attempted pharmacomechanical catheter directed thrombolysis/thrombectomy.  Procedures Performed: 1. Ultrasound-guided puncture of the left popliteal vein 2.  Catheterization of the inferior vena cava above the IVC filter with vena cava gram 3.  Power pulse spray infusion of 6 mg of TPA from the popliteal access to the infrarenal IVC 4.  Placement of a 90 cm total length 50 cm infusion length multi side-hole infusion catheter 5.  Ultrasound-guided puncture of the right popliteal vein 6.  Catheterization of the inferior vena cava above the IVC filter 7.  Power pulse spray infusion of 6 mg of TPA from the right popliteal access into the infrarenal IVC 8.  Placement of a 130 cm total length 50 cm infusion length multi side-hole infusion catheter 9.  Initiation of venous thrombolysis  Interventional Radiologist:  Sterling Big, MD  Sedation: Moderate (conscious) sedation was used.  Four mg Versed, 100 mcg Fentanyl were administered intravenously.  The patient's vital signs were monitored continuously by radiology nursing throughout the procedure.  Sedation Time: 60 minutes  Fluoroscopy  time: 15 minutes 6 seconds  Contrast volume: 10 ml Omnipaque-300  Intravenous medications:  A total of 12 mg TPA was administered into the thrombus  PROCEDURE/FINDINGS:   Informed consent was obtained from the patient  following explanation of the procedure, risks, benefits and alternatives. The patient understands, agrees and consents for the procedure. All questions were addressed. A time out was performed.  Maximal barrier sterile technique utilized including caps, mask, sterile gowns, sterile gloves, large sterile drape, hand hygiene, and betadine skin prep.  The left popliteal fossa was interrogated with ultrasound.  The popliteal vein is expanded and completely thrombosed.  Local anesthesia was attained by infiltration with 1% lidocaine.  Under real time sonographic guidance, the thrombosed vein was punctured with a 21-gauge micropuncture needle.  Images obtained stored for the medical record.  The transitional micro sheath was upsized to a 6-French working vascular sheath over a short Amplatz wire.  Using a stiff Glidewire and angled catheter, the catheter was used to navigate into the infrarenal IVC just beyond the nose cone of the IVC filter.  An inferior vena cava gram was performed limiting the amount of intravenous contrast used.  The left renal vein and inferior vena cava are patent.  Inflow from the right renal vein is also noted indicating patency.  The wire was advanced into the suprarenal IVC.  The 6-French 90 cm length AngioJet device was then used to power pulse spray a total of 6 mg of TPA and 125 ml of saline from the popliteal vein throughout the left lower extremity and into the IVC filter.  A 90 cm total length, 50 cm infusion length Unifuse infusion catheter was then positioned over the wire so that the proximal side hole was just within the filter, and the more proximal side hole in the mid thigh.  The sheath and catheter were secured to the skin with post silk suture.  The right popliteal fossa was interrogated with ultrasound.  Again, the popliteal vein was found be expanded completely thrombosed. Local anesthesia was attained by infiltration with 1% lidocaine. Under real time sonographic guidance, the  vein was punctured using a 21-gauge micropuncture needle.  Images obtained stored for the medical record.  A 6-French working vascular sheath was then placed over a stiff Amplatz wire after transitioning from the transitional micro sheath.  The wire was advanced into the suprarenal IVC.  Care was taken to navigate the wire through the right aspect of the IVC and the filter to maximize lytic dispersal.  The 6-French AngioJet was then used to power pulse spray a total of 6 mg of TPA and 125 ml of saline from the popliteal artery into the IVC.  A 130 cm total length 50 cm infusion length Unifuse multi side-hole infusion catheter was then placed in identical position to the contralateral side.  Initial venous lysis was initiated at 0.25 mg connector place through each catheter for a total of 0.5 mg per hour.  Overall, the patient tolerated the procedure very well.  There was no immediate complication.  IMPRESSION:  1.  Inferior venacavagram demonstrates patency of the renal veins and IVC above the level of the filter.  2. Initial limited bilateral lower extremity pharmacomechanical thrombolysis.  3.  Initiation of bilateral lower extremity venous lysis using bilateral multi side-hole infusion catheters.  The patient will lyse for 12-24 hours and return interventional radiology for venogram and potential adjunctive interventions tomorrow.  Signed,  Sterling Big, MD [facility]  Radiology   Original Report Authenticated By: Malachy Moan, M.D.   US Renal  04/30/2013   CLINICAL DATA:  Acute renal failure.  EXAM: RENAL/URINARY TRACT ULTRASOUND COMPLETE  COMPARISON:  04/18/2013  FINDINGS: Right Kidney  Length: 10.7 cm. Mild right hydronephrosis. Stent is visualized within the dilated renal pelvis. 1.4 cm cyst.  Left Kidney  Length: 14.0 cm. Slight pelvicaliectasis. Stent cannot be visualized.  7.1 cm cystic mass in the upper pole of the left kidney. This likely  represents a benign cyst and is stable since prior study.  Bladder:  Cannot visualize.  IMPRESSION: Mild right hydronephrosis and slight pelvicaliectasis on the left. These findings have improved since prior study.  Bilateral renal cysts.   Electronically Signed   By: Charlett Nose M.D.   On: 04/30/2013 16:58   Ir Thrombect Veno Mech Mod Sed  05/11/2013   *RADIOLOGY REPORT*  PRIOR ULTRASOUND GUIDED VASCULAR ACCESS; IR INFERIOR VENA CAVA GRAM; IR THROMBOLYSIS/THROMBECTOMY BILATERAL LOWER EXTREMITIES; IR INITIATION OF VENOUS LYSIS INITIAL DAY  Date: 05/11/2013  Clinical History: 67 year old male with complicated medical history.  He had a TURP and left double-J ureteral stent placement procedure done in the end of August which was complicated by postoperative PE and subsequent postoperative hemorrhage following initiation of anticoagulation.  Hemostasis was obtained and IVC filter was placed.  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  He is now nearly 4 weeks post operative and showing in significant improvement with systemic heparinization.  He presents for attempted pharmacomechanical catheter directed thrombolysis/thrombectomy.  Procedures Performed: 1. Ultrasound-guided puncture of the left popliteal vein 2.  Catheterization of the inferior vena cava above the IVC filter with vena cava gram 3.  Power pulse spray infusion of 6 mg of TPA from the popliteal access to the infrarenal IVC 4.  Placement of a 90 cm total length 50 cm infusion length multi side-hole infusion catheter 5.  Ultrasound-guided puncture of the right popliteal vein 6.  Catheterization of the inferior vena cava above the IVC filter 7.  Power pulse spray infusion of 6 mg of TPA from the right popliteal access into the infrarenal IVC 8.  Placement of a 130 cm total length 50 cm infusion length multi side-hole  infusion catheter 9.  Initiation of venous thrombolysis  Interventional Radiologist:  Sterling Big, MD  Sedation: Moderate (conscious) sedation was used.  Four mg Versed, 100 mcg Fentanyl were administered intravenously.  The patient's vital signs were monitored continuously by radiology nursing throughout the procedure.  Sedation Time: 60 minutes  Fluoroscopy time: 15 minutes 6 seconds  Contrast volume: 10 ml Omnipaque-300  Intravenous medications:  A total of 12 mg TPA was administered into the thrombus  PROCEDURE/FINDINGS:   Informed consent was obtained from the patient following explanation of the procedure, risks, benefits and alternatives. The patient understands, agrees and consents for the procedure. All questions were addressed. A time out was performed.  Maximal barrier sterile technique utilized including caps, mask, sterile gowns, sterile gloves, large sterile drape, hand hygiene, and betadine skin prep.  The left popliteal fossa was interrogated with ultrasound.  The popliteal vein is expanded and completely thrombosed.  Local anesthesia was attained by infiltration with 1% lidocaine.  Under real time sonographic guidance, the thrombosed vein was punctured with a 21-gauge micropuncture needle.  Images obtained stored for the medical record.  The transitional micro sheath was upsized to  a 6-French working vascular sheath over a short Amplatz wire.  Using a stiff Glidewire and angled catheter, the catheter was used to navigate into the infrarenal IVC just beyond the nose cone of the IVC filter.  An inferior vena cava gram was performed limiting the amount of intravenous contrast used.  The left renal vein and inferior vena cava are patent.  Inflow from the right renal vein is also noted indicating patency.  The wire was advanced into the suprarenal IVC.  The 6-French 90 cm length AngioJet device was then used to power pulse spray a total of 6 mg of TPA and 125 ml of saline from the popliteal vein  throughout the left lower extremity and into the IVC filter.  A 90 cm total length, 50 cm infusion length Unifuse infusion catheter was then positioned over the wire so that the proximal side hole was just within the filter, and the more proximal side hole in the mid thigh.  The sheath and catheter were secured to the skin with post silk suture.  The right popliteal fossa was interrogated with ultrasound.  Again, the popliteal vein was found be expanded completely thrombosed. Local anesthesia was attained by infiltration with 1% lidocaine. Under real time sonographic guidance, the vein was punctured using a 21-gauge micropuncture needle.  Images obtained stored for the medical record.  A 6-French working vascular sheath was then placed over a stiff Amplatz wire after transitioning from the transitional micro sheath.  The wire was advanced into the suprarenal IVC.  Care was taken to navigate the wire through the right aspect of the IVC and the filter to maximize lytic dispersal.  The 6-French AngioJet was then used to power pulse spray a total of 6 mg of TPA and 125 ml of saline from the popliteal artery into the IVC.  A 130 cm total length 50 cm infusion length Unifuse multi side-hole infusion catheter was then placed in identical position to the contralateral side.  Initial venous lysis was initiated at 0.25 mg connector place through each catheter for a total of 0.5 mg per hour.  Overall, the patient tolerated the procedure very well.  There was no immediate complication.  IMPRESSION:  1.  Inferior venacavagram demonstrates patency of the renal veins and IVC above the level of the filter.  2. Initial limited bilateral lower extremity pharmacomechanical thrombolysis.  3.  Initiation of bilateral lower extremity venous lysis using bilateral multi side-hole infusion catheters.  The patient will lyse for 12-24 hours and return interventional radiology for venogram and potential adjunctive interventions tomorrow.   Signed,  Sterling Big, MD [facility] Radiology   Original Report Authenticated By: Malachy Moan, M.D.   Ir Thrombect Veno Mech Mod Sed  05/11/2013   *RADIOLOGY REPORT*  PRIOR ULTRASOUND GUIDED VASCULAR ACCESS; IR INFERIOR VENA CAVA GRAM; IR THROMBOLYSIS/THROMBECTOMY BILATERAL LOWER EXTREMITIES; IR INITIATION OF VENOUS LYSIS INITIAL DAY  Date: 05/11/2013  Clinical History: 67 year old male with complicated medical history.  He had a TURP and left double-J ureteral stent placement procedure done in the end of August which was complicated by postoperative PE and subsequent postoperative hemorrhage following initiation of anticoagulation.  Hemostasis was obtained and IVC filter was placed.  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  He is now nearly 4 weeks post operative and showing in significant improvement with systemic heparinization.  He presents  for attempted pharmacomechanical catheter directed thrombolysis/thrombectomy.  Procedures Performed: 1. Ultrasound-guided puncture of the left popliteal vein 2.  Catheterization of the inferior vena cava above the IVC filter with vena cava gram 3.  Power pulse spray infusion of 6 mg of TPA from the popliteal access to the infrarenal IVC 4.  Placement of a 90 cm total length 50 cm infusion length multi side-hole infusion catheter 5.  Ultrasound-guided puncture of the right popliteal vein 6.  Catheterization of the inferior vena cava above the IVC filter 7.  Power pulse spray infusion of 6 mg of TPA from the right popliteal access into the infrarenal IVC 8.  Placement of a 130 cm total length 50 cm infusion length multi side-hole infusion catheter 9.  Initiation of venous thrombolysis  Interventional Radiologist:  Sterling Big, MD  Sedation: Moderate (conscious) sedation  was used.  Four mg Versed, 100 mcg Fentanyl were administered intravenously.  The patient's vital signs were monitored continuously by radiology nursing throughout the procedure.  Sedation Time: 60 minutes  Fluoroscopy time: 15 minutes 6 seconds  Contrast volume: 10 ml Omnipaque-300  Intravenous medications:  A total of 12 mg TPA was administered into the thrombus  PROCEDURE/FINDINGS:   Informed consent was obtained from the patient following explanation of the procedure, risks, benefits and alternatives. The patient understands, agrees and consents for the procedure. All questions were addressed. A time out was performed.  Maximal barrier sterile technique utilized including caps, mask, sterile gowns, sterile gloves, large sterile drape, hand hygiene, and betadine skin prep.  The left popliteal fossa was interrogated with ultrasound.  The popliteal vein is expanded and completely thrombosed.  Local anesthesia was attained by infiltration with 1% lidocaine.  Under real time sonographic guidance, the thrombosed vein was punctured with a 21-gauge micropuncture needle.  Images obtained stored for the medical record.  The transitional micro sheath was upsized to a 6-French working vascular sheath over a short Amplatz wire.  Using a stiff Glidewire and angled catheter, the catheter was used to navigate into the infrarenal IVC just beyond the nose cone of the IVC filter.  An inferior vena cava gram was performed limiting the amount of intravenous contrast used.  The left renal vein and inferior vena cava are patent.  Inflow from the right renal vein is also noted indicating patency.  The wire was advanced into the suprarenal IVC.  The 6-French 90 cm length AngioJet device was then used to power pulse spray a total of 6 mg of TPA and 125 ml of saline from the popliteal vein throughout the left lower extremity and into the IVC filter.  A 90 cm total length, 50 cm infusion length Unifuse infusion catheter was then positioned  over the wire so that the proximal side hole was just within the filter, and the more proximal side hole in the mid thigh.  The sheath and catheter were secured to the skin with post silk suture.  The right popliteal fossa was interrogated with ultrasound.  Again, the popliteal vein was found be expanded completely thrombosed. Local anesthesia was attained by infiltration with 1% lidocaine. Under real time sonographic guidance, the vein was punctured using a 21-gauge micropuncture needle.  Images obtained stored for the medical record.  A 6-French working vascular sheath was then placed over a stiff Amplatz wire after transitioning from the transitional micro sheath.  The wire was advanced into the suprarenal IVC.  Care was taken to navigate the wire through the right aspect of  the IVC and the filter to maximize lytic dispersal.  The 6-French AngioJet was then used to power pulse spray a total of 6 mg of TPA and 125 ml of saline from the popliteal artery into the IVC.  A 130 cm total length 50 cm infusion length Unifuse multi side-hole infusion catheter was then placed in identical position to the contralateral side.  Initial venous lysis was initiated at 0.25 mg connector place through each catheter for a total of 0.5 mg per hour.  Overall, the patient tolerated the procedure very well.  There was no immediate complication.  IMPRESSION:  1.  Inferior venacavagram demonstrates patency of the renal veins and IVC above the level of the filter.  2. Initial limited bilateral lower extremity pharmacomechanical thrombolysis.  3.  Initiation of bilateral lower extremity venous lysis using bilateral multi side-hole infusion catheters.  The patient will lyse for 12-24 hours and return interventional radiology for venogram and potential adjunctive interventions tomorrow.  Signed,  Sterling Big, MD [facility] Radiology   Original Report Authenticated By: Malachy Moan,  M.D.   Ir US Guide Vasc Access Left  05/11/2013   *RADIOLOGY REPORT*  PRIOR ULTRASOUND GUIDED VASCULAR ACCESS; IR INFERIOR VENA CAVA GRAM; IR THROMBOLYSIS/THROMBECTOMY BILATERAL LOWER EXTREMITIES; IR INITIATION OF VENOUS LYSIS INITIAL DAY  Date: 05/11/2013  Clinical History: 67 year old male with complicated medical history.  He had a TURP and left double-J ureteral stent placement procedure done in the end of August which was complicated by postoperative PE and subsequent postoperative hemorrhage following initiation of anticoagulation.  Hemostasis was obtained and IVC filter was placed.  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  He is now nearly 4 weeks post operative and showing in significant improvement with systemic heparinization.  He presents for attempted pharmacomechanical catheter directed thrombolysis/thrombectomy.  Procedures Performed: 1. Ultrasound-guided puncture of the left popliteal vein 2.  Catheterization of the inferior vena cava above the IVC filter with vena cava gram 3.  Power pulse spray infusion of 6 mg of TPA from the popliteal access to the infrarenal IVC 4.  Placement of a 90 cm total length 50 cm infusion length multi side-hole infusion catheter 5.  Ultrasound-guided puncture of the right popliteal vein 6.  Catheterization of the inferior vena cava above the IVC filter 7.  Power pulse spray infusion of 6 mg of TPA from the right popliteal access into the infrarenal IVC 8.  Placement of a 130 cm total length 50 cm infusion length multi side-hole infusion catheter 9.  Initiation of venous thrombolysis  Interventional Radiologist:  Sterling Big, MD  Sedation: Moderate (conscious) sedation was used.  Four mg Versed, 100 mcg Fentanyl were administered intravenously.  The patient's vital signs were monitored continuously by radiology nursing  throughout the procedure.  Sedation Time: 60 minutes  Fluoroscopy time: 15 minutes 6 seconds  Contrast volume: 10 ml Omnipaque-300  Intravenous medications:  A total of 12 mg TPA was administered into the thrombus  PROCEDURE/FINDINGS:   Informed consent was obtained from the patient following explanation of the procedure, risks, benefits and alternatives. The patient understands, agrees and consents for the procedure. All questions were addressed. A time out was performed.  Maximal barrier sterile technique utilized including caps, mask, sterile gowns, sterile gloves, large sterile drape, hand hygiene, and betadine skin prep.  The left popliteal fossa was interrogated with  ultrasound.  The popliteal vein is expanded and completely thrombosed.  Local anesthesia was attained by infiltration with 1% lidocaine.  Under real time sonographic guidance, the thrombosed vein was punctured with a 21-gauge micropuncture needle.  Images obtained stored for the medical record.  The transitional micro sheath was upsized to a 6-French working vascular sheath over a short Amplatz wire.  Using a stiff Glidewire and angled catheter, the catheter was used to navigate into the infrarenal IVC just beyond the nose cone of the IVC filter.  An inferior vena cava gram was performed limiting the amount of intravenous contrast used.  The left renal vein and inferior vena cava are patent.  Inflow from the right renal vein is also noted indicating patency.  The wire was advanced into the suprarenal IVC.  The 6-French 90 cm length AngioJet device was then used to power pulse spray a total of 6 mg of TPA and 125 ml of saline from the popliteal vein throughout the left lower extremity and into the IVC filter.  A 90 cm total length, 50 cm infusion length Unifuse infusion catheter was then positioned over the wire so that the proximal side hole was just within the filter, and the more proximal side hole in the mid thigh.  The sheath and catheter were  secured to the skin with post silk suture.  The right popliteal fossa was interrogated with ultrasound.  Again, the popliteal vein was found be expanded completely thrombosed. Local anesthesia was attained by infiltration with 1% lidocaine. Under real time sonographic guidance, the vein was punctured using a 21-gauge micropuncture needle.  Images obtained stored for the medical record.  A 6-French working vascular sheath was then placed over a stiff Amplatz wire after transitioning from the transitional micro sheath.  The wire was advanced into the suprarenal IVC.  Care was taken to navigate the wire through the right aspect of the IVC and the filter to maximize lytic dispersal.  The 6-French AngioJet was then used to power pulse spray a total of 6 mg of TPA and 125 ml of saline from the popliteal artery into the IVC.  A 130 cm total length 50 cm infusion length Unifuse multi side-hole infusion catheter was then placed in identical position to the contralateral side.  Initial venous lysis was initiated at 0.25 mg connector place through each catheter for a total of 0.5 mg per hour.  Overall, the patient tolerated the procedure very well.  There was no immediate complication.  IMPRESSION:  1.  Inferior venacavagram demonstrates patency of the renal veins and IVC above the level of the filter.  2. Initial limited bilateral lower extremity pharmacomechanical thrombolysis.  3.  Initiation of bilateral lower extremity venous lysis using bilateral multi side-hole infusion catheters.  The patient will lyse for 12-24 hours and return interventional radiology for venogram and potential adjunctive interventions tomorrow.  Signed,  Sterling Big, MD [facility] Radiology   Original Report Authenticated By: Malachy Moan, M.D.   Ir US Guide Vasc Access Right  05/11/2013   *RADIOLOGY REPORT*  PRIOR ULTRASOUND GUIDED VASCULAR ACCESS; IR INFERIOR VENA CAVA GRAM; IR  THROMBOLYSIS/THROMBECTOMY BILATERAL LOWER EXTREMITIES; IR INITIATION OF VENOUS LYSIS INITIAL DAY  Date: 05/11/2013  Clinical History: 67 year old male with complicated medical history.  He had a TURP and left double-J ureteral stent placement procedure done in the end of August which was complicated by postoperative PE and subsequent postoperative hemorrhage following initiation of anticoagulation.  Hemostasis was  obtained and IVC filter was placed.  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  He is now nearly 4 weeks post operative and showing in significant improvement with systemic heparinization.  He presents for attempted pharmacomechanical catheter directed thrombolysis/thrombectomy.  Procedures Performed: 1. Ultrasound-guided puncture of the left popliteal vein 2.  Catheterization of the inferior vena cava above the IVC filter with vena cava gram 3.  Power pulse spray infusion of 6 mg of TPA from the popliteal access to the infrarenal IVC 4.  Placement of a 90 cm total length 50 cm infusion length multi side-hole infusion catheter 5.  Ultrasound-guided puncture of the right popliteal vein 6.  Catheterization of the inferior vena cava above the IVC filter 7.  Power pulse spray infusion of 6 mg of TPA from the right popliteal access into the infrarenal IVC 8.  Placement of a 130 cm total length 50 cm infusion length multi side-hole infusion catheter 9.  Initiation of venous thrombolysis  Interventional Radiologist:  Sterling Big, MD  Sedation: Moderate (conscious) sedation was used.  Four mg Versed, 100 mcg Fentanyl were administered intravenously.  The patient's vital signs were monitored continuously by radiology nursing throughout the procedure.  Sedation Time: 60 minutes  Fluoroscopy time: 15 minutes 6 seconds  Contrast volume: 10 ml Omnipaque-300  Intravenous  medications:  A total of 12 mg TPA was administered into the thrombus  PROCEDURE/FINDINGS:   Informed consent was obtained from the patient following explanation of the procedure, risks, benefits and alternatives. The patient understands, agrees and consents for the procedure. All questions were addressed. A time out was performed.  Maximal barrier sterile technique utilized including caps, mask, sterile gowns, sterile gloves, large sterile drape, hand hygiene, and betadine skin prep.  The left popliteal fossa was interrogated with ultrasound.  The popliteal vein is expanded and completely thrombosed.  Local anesthesia was attained by infiltration with 1% lidocaine.  Under real time sonographic guidance, the thrombosed vein was punctured with a 21-gauge micropuncture needle.  Images obtained stored for the medical record.  The transitional micro sheath was upsized to a 6-French working vascular sheath over a short Amplatz wire.  Using a stiff Glidewire and angled catheter, the catheter was used to navigate into the infrarenal IVC just beyond the nose cone of the IVC filter.  An inferior vena cava gram was performed limiting the amount of intravenous contrast used.  The left renal vein and inferior vena cava are patent.  Inflow from the right renal vein is also noted indicating patency.  The wire was advanced into the suprarenal IVC.  The 6-French 90 cm length AngioJet device was then used to power pulse spray a total of 6 mg of TPA and 125 ml of saline from the popliteal vein throughout the left lower extremity and into the IVC filter.  A 90 cm total length, 50 cm infusion length Unifuse infusion catheter was then positioned over the wire so that the proximal side hole was just within the filter, and the more proximal side hole in the mid thigh.  The sheath and catheter were secured to the skin with post silk suture.  The right popliteal fossa was interrogated with ultrasound.  Again, the popliteal vein was found be  expanded completely thrombosed. Local anesthesia was attained by infiltration with 1% lidocaine. Under real time sonographic guidance, the vein was punctured using a 21-gauge micropuncture  needle.  Images obtained stored for the medical record.  A 6-French working vascular sheath was then placed over a stiff Amplatz wire after transitioning from the transitional micro sheath.  The wire was advanced into the suprarenal IVC.  Care was taken to navigate the wire through the right aspect of the IVC and the filter to maximize lytic dispersal.  The 6-French AngioJet was then used to power pulse spray a total of 6 mg of TPA and 125 ml of saline from the popliteal artery into the IVC.  A 130 cm total length 50 cm infusion length Unifuse multi side-hole infusion catheter was then placed in identical position to the contralateral side.  Initial venous lysis was initiated at 0.25 mg connector place through each catheter for a total of 0.5 mg per hour.  Overall, the patient tolerated the procedure very well.  There was no immediate complication.  IMPRESSION:  1.  Inferior venacavagram demonstrates patency of the renal veins and IVC above the level of the filter.  2. Initial limited bilateral lower extremity pharmacomechanical thrombolysis.  3.  Initiation of bilateral lower extremity venous lysis using bilateral multi side-hole infusion catheters.  The patient will lyse for 12-24 hours and return interventional radiology for venogram and potential adjunctive interventions tomorrow.  Signed,  Sterling Big, MD [facility]   Original Report Authenticated By: Malachy Moan, M.D.   Dg Chest Port 1 View  05/13/2013   CLINICAL DATA:  Shortness of breath. Atelectasis.  EXAM: PORTABLE CHEST - 1 VIEW  COMPARISON:  04/29/2013  FINDINGS: Slight improvement in aeration and lung volumes. No confluent opacities currently. Heart is upper limits normal in size. No effusions.  No acute bony abnormality.  IMPRESSION: No active disease.   Electronically Signed   By: Charlett Nose M.D.   On: 05/13/2013 05:27   Dg Knee Left Port  05/16/2013   CLINICAL DATA:  Pain and swelling  EXAM: PORTABLE LEFT KNEE - 1-2 VIEW  COMPARISON:  None.  FINDINGS: There is no evidence of fracture or dislocation. Trace suprapatellar fluid identified. There is no focal bone abnormality. Soft tissues are remarkable for mild edema diffusely. Tricompartmental degenerative change noted.  IMPRESSION: Degenerative change without acute osseous abnormality.   Electronically Signed   By: Christiana Pellant M.D.   On: 05/16/2013 21:47   Ct Angio Abd/pel W/ And/or W/o  05/10/2013   *RADIOLOGY REPORT*  Clinical Data: Extensive bilateral DVT, evaluate for caval thrombus.  CT ANGIOGRAPHY ABDOMEN AND PELVIS WITH CONTRAST AND WITHOUT CONTRAST  Technique:  Multidetector CT imaging of the abdomen and pelvis was performed following the standard protocol during bolus administration of intravenous contrast.  Comparison: Prior CT abdomen/pelvis 04/30/2013  Findings:  Lower Chest:  Mild dependent atelectasis versus scarring in the posterior left lower lobe.  The lung bases are otherwise clear. Visualized heart within normal limits for size.  No pericardial effusion.  Unremarkable distal thoracic esophagus.  Abdomen: Unremarkable CT appearance of the stomach, duodenum, spleen, pancreas and left adrenal gland.  Dystrophic calcification in the right adrenal gland is nonspecific, but stable.  Normal hepatic contours and morphology.  No focal hepatic mass. Gallbladder is unremarkable. No intra or extrahepatic biliary ductal dilatation.  Stable 7.7 cm simple cyst exophytic from the upper pole of the left kidney.  Stable low attenuation left subcapsular fluid collection, likely a liquefied subcapsular hematoma.  No hydronephrosis. Bilateral double J ureteral stents are in place.  There are multiple small parapelvic cysts  in the right kidney.   Small exophytic renal cortical cyst on the right.  Symmetric bilateral renal perfusion.  No bowel obstruction or focal bowel wall thickening.  No free fluid or suspicious adenopathy.  Pelvis: The bladder is decompressed.  There is a suprapubic catheter as well as to double-J ureteral stents.  Surgical changes of prior TURP.  No free fluid or suspicious adenopathy.  Bones: No acute fracture or aggressive appearing lytic or blastic osseous lesion.  Vascular: Extensive thrombus beginning in the inferior vena cava contained within the IVC filter and extending through out of the bilateral iliac and femoral venous systems.  Thrombus likely extends into the superficial great saphenous veins bilaterally as well.  No significant provocation superior to the filter.  The renal veins are patent bilaterally.  Mild scattered atherosclerotic vascular calcifications.  No acute arterial abnormality.  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  No evidence of propagation more centrally within the inferior vena cava.  The bilateral renal veins remain widely patent.  Additional ancillary findings as above without significant interval change.   Original Report Authenticated By: Malachy Moan, M.D.   Ir Infusion Thrombol Venous Initial (ms)  05/11/2013   *RADIOLOGY REPORT*  PRIOR ULTRASOUND GUIDED VASCULAR ACCESS; IR INFERIOR VENA CAVA GRAM; IR THROMBOLYSIS/THROMBECTOMY BILATERAL LOWER EXTREMITIES; IR INITIATION OF VENOUS LYSIS INITIAL DAY  Date: 05/11/2013  Clinical History: 67 year old male with complicated medical history.  He had a TURP and left double-J ureteral stent placement procedure done in the end of August which was complicated by postoperative PE and subsequent postoperative hemorrhage following initiation of anticoagulation.  Hemostasis was obtained and IVC filter was placed.  The patient subsequently developed extensive bilateral lower extremity  swelling and was found to have extensive bilateral lower extremity and caval DVT extending into the filter.  He is now nearly 4 weeks post operative and showing in significant improvement with systemic heparinization.  He presents for attempted pharmacomechanical catheter directed thrombolysis/thrombectomy.  Procedures Performed: 1. Ultrasound-guided puncture of the left popliteal vein 2.  Catheterization of the inferior vena cava above the IVC filter with vena cava gram 3.  Power pulse spray infusion of 6 mg of TPA from the popliteal access to the infrarenal IVC 4.  Placement of a 90 cm total length 50 cm infusion length multi side-hole infusion catheter 5.  Ultrasound-guided puncture of the right popliteal vein 6.  Catheterization of the inferior vena cava above the IVC filter 7.  Power pulse spray infusion of 6 mg of TPA from the right popliteal access into the infrarenal IVC 8.  Placement of a 130 cm total length 50 cm infusion length multi side-hole infusion catheter 9.  Initiation of venous thrombolysis  Interventional Radiologist:  Sterling Big, MD  Sedation: Moderate (conscious) sedation was used.  Four mg Versed, 100 mcg Fentanyl were administered intravenously.  The patient's vital signs were monitored continuously by radiology nursing throughout the procedure.  Sedation Time: 60 minutes  Fluoroscopy time: 15 minutes 6 seconds  Contrast volume: 10 ml Omnipaque-300  Intravenous medications:  A total of 12 mg TPA was administered into the thrombus  PROCEDURE/FINDINGS:   Informed consent was obtained from the patient following explanation of the procedure, risks, benefits and alternatives. The patient understands, agrees and consents for the procedure. All questions were addressed. A time out was performed.  Maximal barrier sterile technique utilized including caps, mask, sterile gowns, sterile gloves, large sterile drape, hand hygiene,  and betadine skin prep.  The left popliteal fossa was interrogated  with ultrasound.  The popliteal vein is expanded and completely thrombosed.  Local anesthesia was attained by infiltration with 1% lidocaine.  Under real time sonographic guidance, the thrombosed vein was punctured with a 21-gauge micropuncture needle.  Images obtained stored for the medical record.  The transitional micro sheath was upsized to a 6-French working vascular sheath over a short Amplatz wire.  Using a stiff Glidewire and angled catheter, the catheter was used to navigate into the infrarenal IVC just beyond the nose cone of the IVC filter.  An inferior vena cava gram was performed limiting the amount of intravenous contrast used.  The left renal vein and inferior vena cava are patent.  Inflow from the right renal vein is also noted indicating patency.  The wire was advanced into the suprarenal IVC.  The 6-French 90 cm length AngioJet device was then used to power pulse spray a total of 6 mg of TPA and 125 ml of saline from the popliteal vein throughout the left lower extremity and into the IVC filter.  A 90 cm total length, 50 cm infusion length Unifuse infusion catheter was then positioned over the wire so that the proximal side hole was just within the filter, and the more proximal side hole in the mid thigh.  The sheath and catheter were secured to the skin with post silk suture.  The right popliteal fossa was interrogated with ultrasound.  Again, the popliteal vein was found be expanded completely thrombosed. Local anesthesia was attained by infiltration with 1% lidocaine. Under real time sonographic guidance, the vein was punctured using a 21-gauge micropuncture needle.  Images obtained stored for the medical record.  A 6-French working vascular sheath was then placed over a stiff Amplatz wire after transitioning from the transitional micro sheath.  The wire was advanced into the suprarenal IVC.  Care was taken to navigate the wire through the right aspect of the IVC and the filter to maximize lytic  dispersal.  The 6-French AngioJet was then used to power pulse spray a total of 6 mg of TPA and 125 ml of saline from the popliteal artery into the IVC.  A 130 cm total length 50 cm infusion length Unifuse multi side-hole infusion catheter was then placed in identical position to the contralateral side.  Initial venous lysis was initiated at 0.25 mg connector place through each catheter for a total of 0.5 mg per hour.  Overall, the patient tolerated the procedure very well.  There was no immediate complication.  IMPRESSION:  1.  Inferior venacavagram demonstrates patency of the renal veins and IVC above the level of the filter.  2. Initial limited bilateral lower extremity pharmacomechanical thrombolysis.  3.  Initiation of bilateral lower extremity venous lysis using bilateral multi side-hole infusion catheters.  The patient will lyse for 12-24 hours and return interventional radiology for venogram and potential adjunctive interventions tomorrow.  Signed,  Sterling Big, MD [facility]iology   Original Report Authenticated By: Malachy Moan, M.D.   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 Status post initiation of catheter directed venous thrombolytic therapy via bilateral popliteal access yesterday.  EXAM: 1. Followup angiography of bilateral lower extremities and IVC on completion of thrombolytic therapy.  2.  Venous angioplasty of right external and common iliac veins  FLUOROSCOPY TIME:  FLUOROSCOPY TIME  3 min and 12 seconds.  PROCEDURE: In a prone position, both pre-existing popliteal venous sheaths and infusion catheters were prepped with Betadine. Venography was performed of both lower extremities with injection of bilateral popliteal sheathes as well as both indwelling infusion catheters to assess venous patency from the level of the popliteal veins bilaterally  through the iliac veins and inferior vena cava, including at the level of the IVC filter.  Right common iliac and external iliac veins were dilated with a 10 mm x 4 cm Conquest balloon. Additional venography was performed at the level of the common femoral veins bilaterally via 5 French catheters. Upon completion of the procedure, IV heparin was turned off and manual compression was held at both popliteal venous access sites after sheath removal. Thrombolytic therapy was discontinued.  Complications: None  FINDINGS: Venography demonstrates dramatically improved patency of bilateral popliteal and femoral veins in both lower extremities with antegrade flow reestablished and no significant residual thrombus present. A minimal amount of adherent mural thrombus was present in the right femoral vein. Left-sided iliac veins showed good patency and flow with no significant narrowing identified.  Right-sided iliac veins showed less rapid antegrade flow with some mural thrombus and stenosis present at the level of the common and external iliac veins. Iliac venous patency improved significantly after 10 mm balloon angioplasty with excellent antegrade flow present.  Visualized segment of the inferior vena cava shows no significant residual thrombus with flow present, including at the level of the indwelling IVC filter. There may be a minimal amount of residual mural thrombus remaining in the lower IVC which does not appear to be impeding flow.  IMPRESSION: Dramatic improvement in patency of bilateral lower extremity deep veins, iliac veins and the IVC. There was some sluggish flow associated with an area of stenosis of the right external and common iliac veins. This segment was treated with 10 mm balloon angioplasty with improved result.  PREPERATION AND PROCEDURE MEDICATIONS: 4.0 mg IV Versed and and 200 mcg IV fentanyl.  Sedation time: 45 min   Electronically Signed   By: Irish Lack   On: 05/12/2013 15:57     Microbiology: Recent Results (from the past 240 hour(s))  URINE CULTURE     Status: None   Collection Time    05/27/13  2:22 PM      Result Value Range Status   Specimen Description URINE, CLEAN CATCH   Final   Special Requests NONE   Final   Culture  Setup Time     Final   Value: 05/27/2013 15:49     Performed at [facility] Count     Final   Value: NO GROWTH     Performed at [facility]   Culture     Final   Value: NO GROWTH     Performed at [facility]   Report Status 05/28/2013 FINAL   Final     Labs: Basic Metabolic Panel:  Recent Labs Lab 05/24/13 0540 05/25/13 0550 05/27/13 0615  NA 136 134* 137  K 4.4 4.4 4.3  CL 105 104 106  CO2 26 24 26   GLUCOSE 113* 129* 98  BUN 15 17 14   CREATININE 1.10 1.12 0.98  CALCIUM 7.5* 8.0* 7.8*   Liver Function Tests: No results found for this basename: AST, ALT, ALKPHOS, BILITOT, PROT, ALBUMIN,  in the last 168 hours No results found for this basename: LIPASE, AMYLASE,  in the last 168 hours No results  found for this basename: AMMONIA,  in the last 168 hours CBC:  Recent Labs Lab 05/25/13 0550 05/26/13 0736 05/27/13 0615 05/28/13 1150 05/29/13 0450  WBC 6.5 5.3 4.8 4.5 4.7  HGB 7.3* 7.4* 7.6* 7.9* 8.2*  HCT 22.1* 23.6* 24.2* 25.0* 26.3*  MCV 87.7 91.1 91.0 91.6 92.0  PLT 263 277 286 279 295   Cardiac Enzymes: No results found for this basename: CKTOTAL, CKMB, CKMBINDEX, TROPONINI,  in the last 168 hours BNP: BNP (last 3 results)  Recent Labs  04/18/13 1245 04/27/13 1240  PROBNP 1762.0* 1260.0*   CBG:  Recent Labs Lab 05/26/13 1634  GLUCAP 105*    Additional labs: 1. Anemia panel: Iron 40, TIBC 145, saturation ratio 28, ferritin 1155, folate 6.3 and vitamin B12: 396 2. Stool C. difficile PCR positive on 05/16/13 3. 2-D echo 04/30/13: Study Conclusions  - Left ventricle: Study is severely limited. The cavity size was normal. The estimated ejection fraction was  65%. Images were inadequate for LV wall motion assessment. - Left atrium: The atrium was mildly dilated. - Right ventricle: RV may be OK. Not able to assess RV function due to poor visualization. Not able to assess RV size due to poor visualization. 4. Bilateral lower extremity venous Dopplers 05/18/13: Summary: Findings consistent with non occlusive subacute deep vein thrombosis involving thebilateral common femoral, femoral, and left popliteal veins of thelower extremity. There is subacute occlusive DVT noted in the right popliteal and posterior tibial veins. 5. Bilateral lower extremity venous Dopplers 05/04/13: Summary:  - Findings consistent with acute deep vein thrombosis involving the right common femoral, profunda, femoral, popliteal, and posterior tibial veins. - Findings consistent with acute deep vein thrombosis involving the left common femoral, profunda, femoral, popliteal, and posterior tibial veins. Superficial thrombus is noted in the greater and lesser saphenous veins bilaterally. - No evidence of Baker's cyst on the right or left.   Signed:  Marcellus Scott, MD, FACP, FHM. [facility] Pager [phone]  If 7PM-7AM, please contact night-coverage [email or web address] Password [email or web address] 05/29/2013, 11:50 AM

## 2013-06-05 ENCOUNTER — Other Ambulatory Visit (HOSPITAL_COMMUNITY): Payer: Self-pay | Admitting: Interventional Radiology

## 2013-06-05 DIAGNOSIS — I82220 Acute embolism and thrombosis of inferior vena cava: Secondary | ICD-10-CM

## 2013-06-05 DIAGNOSIS — I82403 Acute embolism and thrombosis of unspecified deep veins of lower extremity, bilateral: Secondary | ICD-10-CM

## 2013-06-10 ENCOUNTER — Encounter: Payer: Self-pay | Admitting: Vascular Surgery

## 2013-06-10 ENCOUNTER — Encounter: Payer: Self-pay | Admitting: Emergency Medicine

## 2013-06-11 ENCOUNTER — Emergency Department (HOSPITAL_COMMUNITY): Payer: PRIVATE HEALTH INSURANCE

## 2013-06-11 ENCOUNTER — Encounter: Payer: PRIVATE HEALTH INSURANCE | Admitting: Vascular Surgery

## 2013-06-11 ENCOUNTER — Inpatient Hospital Stay (HOSPITAL_COMMUNITY)
Admission: EM | Admit: 2013-06-11 | Discharge: 2013-06-17 | DRG: 872 | Disposition: A | Discharge status: Skilled Nursing Facility | Payer: PRIVATE HEALTH INSURANCE | Attending: Internal Medicine | Admitting: Internal Medicine

## 2013-06-11 ENCOUNTER — Encounter (HOSPITAL_COMMUNITY): Payer: Self-pay | Admitting: Emergency Medicine

## 2013-06-11 DIAGNOSIS — I82409 Acute embolism and thrombosis of unspecified deep veins of unspecified lower extremity: Secondary | ICD-10-CM

## 2013-06-11 DIAGNOSIS — Z7901 Long term (current) use of anticoagulants: Secondary | ICD-10-CM

## 2013-06-11 DIAGNOSIS — A419 Sepsis, unspecified organism: Secondary | ICD-10-CM

## 2013-06-11 DIAGNOSIS — I129 Hypertensive chronic kidney disease with stage 1 through stage 4 chronic kidney disease, or unspecified chronic kidney disease: Secondary | ICD-10-CM | POA: Diagnosis present

## 2013-06-11 DIAGNOSIS — L02219 Cutaneous abscess of trunk, unspecified: Secondary | ICD-10-CM | POA: Diagnosis present

## 2013-06-11 DIAGNOSIS — Z86711 Personal history of pulmonary embolism: Secondary | ICD-10-CM

## 2013-06-11 DIAGNOSIS — E669 Obesity, unspecified: Secondary | ICD-10-CM | POA: Diagnosis present

## 2013-06-11 DIAGNOSIS — L0291 Cutaneous abscess, unspecified: Secondary | ICD-10-CM

## 2013-06-11 DIAGNOSIS — N179 Acute kidney failure, unspecified: Secondary | ICD-10-CM

## 2013-06-11 DIAGNOSIS — Z9079 Acquired absence of other genital organ(s): Secondary | ICD-10-CM

## 2013-06-11 DIAGNOSIS — D62 Acute posthemorrhagic anemia: Secondary | ICD-10-CM

## 2013-06-11 DIAGNOSIS — J9601 Acute respiratory failure with hypoxia: Secondary | ICD-10-CM

## 2013-06-11 DIAGNOSIS — B9561 Methicillin susceptible Staphylococcus aureus infection as the cause of diseases classified elsewhere: Secondary | ICD-10-CM

## 2013-06-11 DIAGNOSIS — Z85038 Personal history of other malignant neoplasm of large intestine: Secondary | ICD-10-CM

## 2013-06-11 DIAGNOSIS — E875 Hyperkalemia: Secondary | ICD-10-CM

## 2013-06-11 DIAGNOSIS — R339 Retention of urine, unspecified: Secondary | ICD-10-CM | POA: Diagnosis present

## 2013-06-11 DIAGNOSIS — J45909 Unspecified asthma, uncomplicated: Secondary | ICD-10-CM | POA: Diagnosis present

## 2013-06-11 DIAGNOSIS — Z86718 Personal history of other venous thrombosis and embolism: Secondary | ICD-10-CM

## 2013-06-11 DIAGNOSIS — L0390 Cellulitis, unspecified: Secondary | ICD-10-CM

## 2013-06-11 DIAGNOSIS — Z79899 Other long term (current) drug therapy: Secondary | ICD-10-CM

## 2013-06-11 DIAGNOSIS — R7881 Bacteremia: Secondary | ICD-10-CM

## 2013-06-11 DIAGNOSIS — D649 Anemia, unspecified: Secondary | ICD-10-CM

## 2013-06-11 DIAGNOSIS — T83511A Infection and inflammatory reaction due to indwelling urethral catheter, initial encounter: Secondary | ICD-10-CM | POA: Diagnosis present

## 2013-06-11 DIAGNOSIS — R52 Pain, unspecified: Secondary | ICD-10-CM

## 2013-06-11 DIAGNOSIS — A4101 Sepsis due to Methicillin susceptible Staphylococcus aureus: Principal | ICD-10-CM

## 2013-06-11 DIAGNOSIS — R6510 Systemic inflammatory response syndrome (SIRS) of non-infectious origin without acute organ dysfunction: Secondary | ICD-10-CM

## 2013-06-11 DIAGNOSIS — I2699 Other pulmonary embolism without acute cor pulmonale: Secondary | ICD-10-CM

## 2013-06-11 DIAGNOSIS — S37019S Minor contusion of unspecified kidney, sequela: Secondary | ICD-10-CM

## 2013-06-11 DIAGNOSIS — I82403 Acute embolism and thrombosis of unspecified deep veins of lower extremity, bilateral: Secondary | ICD-10-CM

## 2013-06-11 DIAGNOSIS — N189 Chronic kidney disease, unspecified: Secondary | ICD-10-CM

## 2013-06-11 DIAGNOSIS — Z113 Encounter for screening for infections with a predominantly sexual mode of transmission: Secondary | ICD-10-CM

## 2013-06-11 DIAGNOSIS — R319 Hematuria, unspecified: Secondary | ICD-10-CM

## 2013-06-11 DIAGNOSIS — N390 Urinary tract infection, site not specified: Secondary | ICD-10-CM

## 2013-06-11 DIAGNOSIS — K922 Gastrointestinal hemorrhage, unspecified: Secondary | ICD-10-CM

## 2013-06-11 HISTORY — DX: Traumatic compartment syndrome of unspecified lower extremity, initial encounter: T79.A29A

## 2013-06-11 HISTORY — DX: Acute embolism and thrombosis of unspecified deep veins of lower extremity, bilateral: I82.403

## 2013-06-11 LAB — URINE MICROSCOPIC-ADD ON

## 2013-06-11 LAB — CBC WITH DIFFERENTIAL/PLATELET
Basophils Absolute: 0 10*3/uL (ref 0.0–0.1)
Basophils Relative: 1 % (ref 0–1)
Eosinophils Absolute: 0 10*3/uL (ref 0.0–0.7)
Eosinophils Relative: 1 % (ref 0–5)
HCT: 29.8 % — ABNORMAL LOW (ref 39.0–52.0)
Hemoglobin: 9.3 g/dL — ABNORMAL LOW (ref 13.0–17.0)
Lymphocytes Relative: 18 % (ref 12–46)
Lymphs Abs: 1 K/uL (ref 0.7–4.0)
MCH: 27.8 pg (ref 26.0–34.0)
MCHC: 31.2 g/dL (ref 30.0–36.0)
MCV: 89 fL (ref 78.0–100.0)
Monocytes Absolute: 0.6 K/uL (ref 0.1–1.0)
Monocytes Relative: 10 % (ref 3–12)
Neutro Abs: 4 10*3/uL (ref 1.7–7.7)
Neutrophils Relative %: 71 % (ref 43–77)
Platelets: 201 10*3/uL (ref 150–400)
RBC: 3.35 MIL/uL — ABNORMAL LOW (ref 4.22–5.81)
RDW: 15.7 % — ABNORMAL HIGH (ref 11.5–15.5)
WBC: 5.7 K/uL (ref 4.0–10.5)

## 2013-06-11 LAB — URINALYSIS, ROUTINE W REFLEX MICROSCOPIC
Nitrite: POSITIVE — AB
Protein, ur: >300 mg/dL — AB
Urobilinogen, UA: 1 mg/dL (ref 0.0–1.0)

## 2013-06-11 LAB — COMPREHENSIVE METABOLIC PANEL WITH GFR
CO2: 22 meq/L (ref 19–32)
Creatinine, Ser: 1.14 mg/dL (ref 0.50–1.35)
GFR calc Af Amer: 75 mL/min — ABNORMAL LOW (ref >90)
GFR calc non Af Amer: 65 mL/min — ABNORMAL LOW (ref >90)
Glucose, Bld: 154 mg/dL — ABNORMAL HIGH (ref 70–99)

## 2013-06-11 LAB — COMPREHENSIVE METABOLIC PANEL
ALT: 8 U/L (ref 0–53)
AST: 15 U/L (ref 0–37)
Albumin: 2.6 g/dL — ABNORMAL LOW (ref 3.5–5.2)
Alkaline Phosphatase: 64 U/L (ref 39–117)
BUN: 11 mg/dL (ref 6–23)
Calcium: 8.3 mg/dL — ABNORMAL LOW (ref 8.4–10.5)
Chloride: 104 mEq/L (ref 96–112)
Potassium: 4 mEq/L (ref 3.5–5.1)
Sodium: 138 mEq/L (ref 135–145)
Total Bilirubin: 0.5 mg/dL (ref 0.3–1.2)
Total Protein: 6.6 g/dL (ref 6.0–8.3)

## 2013-06-11 LAB — CG4 I-STAT (LACTIC ACID): Lactic Acid, Venous: 1.7 mmol/L (ref 0.5–2.2)

## 2013-06-11 MED ORDER — ALBUTEROL SULFATE (5 MG/ML) 0.5% IN NEBU: 2.5000 mg | INHALATION_SOLUTION | RESPIRATORY_TRACT | Status: DC | PRN

## 2013-06-11 MED ORDER — SODIUM CHLORIDE 0.9 % IJ SOLN
3.0000 mL | Freq: Two times a day (BID) | INTRAMUSCULAR | Status: DC
  Administered 2013-06-12 – 2013-06-16 (×5): 3 mL via INTRAVENOUS

## 2013-06-11 MED ORDER — ACETAMINOPHEN 325 MG PO TABS
650.0000 mg | ORAL_TABLET | Freq: Once | ORAL | Status: AC
  Administered 2013-06-11: 650 mg via ORAL

## 2013-06-11 MED ORDER — RIVAROXABAN 20 MG PO TABS: 20.0000 mg | ORAL_TABLET | Freq: Every day | ORAL | Status: DC

## 2013-06-11 MED ORDER — ONDANSETRON HCL 4 MG/2ML IJ SOLN: 4.0000 mg | Freq: Four times a day (QID) | INTRAMUSCULAR | Status: DC | PRN

## 2013-06-11 MED ORDER — VANCOMYCIN HCL 10 G IV SOLR
1250.0000 mg | Freq: Two times a day (BID) | INTRAVENOUS | Status: DC
  Administered 2013-06-12 – 2013-06-15 (×7): 1250 mg via INTRAVENOUS
  Filled 2013-06-11 (×8): qty 1250

## 2013-06-11 MED ORDER — VANCOMYCIN HCL 10 G IV SOLR
2500.0000 mg | Freq: Once | INTRAVENOUS | Status: DC
  Filled 2013-06-11: qty 2500

## 2013-06-11 MED ORDER — ONDANSETRON HCL 4 MG PO TABS: 4.0000 mg | ORAL_TABLET | Freq: Four times a day (QID) | ORAL | Status: DC | PRN

## 2013-06-11 MED ORDER — ACETAMINOPHEN 325 MG PO TABS
650.0000 mg | ORAL_TABLET | Freq: Four times a day (QID) | ORAL | Status: DC | PRN
  Administered 2013-06-12 (×2): 650 mg via ORAL
  Filled 2013-06-11 (×2): qty 2

## 2013-06-11 MED ORDER — MORPHINE SULFATE 2 MG/ML IJ SOLN: 2.0000 mg | INTRAMUSCULAR | Status: DC | PRN

## 2013-06-11 MED ORDER — PIPERACILLIN-TAZOBACTAM 3.375 G IVPB 30 MIN
3.3750 g | Freq: Once | INTRAVENOUS | Status: AC
  Administered 2013-06-11: 3.375 g via INTRAVENOUS
  Filled 2013-06-11: qty 50

## 2013-06-11 MED ORDER — DOCUSATE SODIUM 100 MG PO CAPS
100.0000 mg | ORAL_CAPSULE | Freq: Two times a day (BID) | ORAL | Status: DC
  Administered 2013-06-12 – 2013-06-17 (×11): 100 mg via ORAL
  Filled 2013-06-11 (×13): qty 1

## 2013-06-11 MED ORDER — VANCOMYCIN HCL IN DEXTROSE 1-5 GM/200ML-% IV SOLN
1000.0000 mg | Freq: Once | INTRAVENOUS | Status: DC
  Filled 2013-06-11: qty 200

## 2013-06-11 MED ORDER — ALPRAZOLAM 0.25 MG PO TABS: 0.2500 mg | ORAL_TABLET | Freq: Three times a day (TID) | ORAL | Status: DC | PRN

## 2013-06-11 MED ORDER — VANCOMYCIN HCL 10 G IV SOLR
1500.0000 mg | Freq: Once | INTRAVENOUS | Status: DC
  Filled 2013-06-11: qty 1500

## 2013-06-11 MED ORDER — SODIUM CHLORIDE 0.9 % IV SOLN
INTRAVENOUS | Status: AC
  Administered 2013-06-12: via INTRAVENOUS

## 2013-06-11 MED ORDER — RIVAROXABAN 15 MG PO TABS
15.0000 mg | ORAL_TABLET | Freq: Two times a day (BID) | ORAL | Status: DC
  Administered 2013-06-12 – 2013-06-17 (×11): 15 mg via ORAL
  Filled 2013-06-11 (×13): qty 1

## 2013-06-11 MED ORDER — SODIUM CHLORIDE 0.9 % IV BOLUS (SEPSIS)
1000.0000 mL | Freq: Once | INTRAVENOUS | Status: AC
  Administered 2013-06-11: 1000 mL via INTRAVENOUS

## 2013-06-11 MED ORDER — PIPERACILLIN-TAZOBACTAM 3.375 G IVPB
3.3750 g | Freq: Three times a day (TID) | INTRAVENOUS | Status: DC
  Filled 2013-06-11: qty 50

## 2013-06-11 MED ORDER — PANTOPRAZOLE SODIUM 40 MG PO TBEC
40.0000 mg | DELAYED_RELEASE_TABLET | Freq: Every day | ORAL | Status: DC
  Administered 2013-06-12 – 2013-06-17 (×6): 40 mg via ORAL
  Filled 2013-06-11 (×6): qty 1

## 2013-06-11 MED ORDER — PIPERACILLIN-TAZOBACTAM 3.375 G IVPB
3.3750 g | Freq: Three times a day (TID) | INTRAVENOUS | Status: DC
  Administered 2013-06-12 – 2013-06-13 (×6): 3.375 g via INTRAVENOUS
  Filled 2013-06-11 (×8): qty 50

## 2013-06-11 MED ORDER — ACETAMINOPHEN 650 MG RE SUPP: 650.0000 mg | Freq: Four times a day (QID) | RECTAL | Status: DC | PRN

## 2013-06-11 MED ORDER — OXYCODONE HCL 5 MG PO TABS
5.0000 mg | ORAL_TABLET | Freq: Four times a day (QID) | ORAL | Status: DC | PRN
  Administered 2013-06-15: 5 mg via ORAL
  Filled 2013-06-11: qty 1

## 2013-06-11 MED ORDER — VANCOMYCIN HCL 10 G IV SOLR
2500.0000 mg | Freq: Once | INTRAVENOUS | Status: AC
  Administered 2013-06-11: 2500 mg via INTRAVENOUS
  Filled 2013-06-11: qty 2500

## 2013-06-11 NOTE — ED Provider Notes (Signed)
Medical screening examination/treatment/procedure(s) were conducted as a shared visit with non-physician practitioner(s) and myself.  I personally evaluated the patient during the encounter.  EKG Interpretation   None       Pt with fever, tachycardia, SIRS criteria.  Pt with very complex history beginning with GU surgery at [facility], developed complications with DVT and with anticoagulation, bleeding issues, required fasciotomies, and developed recurrent infections post operatively, seen by [facility] later, has current suprapubic catheter.  Pt with high fevers and pus drainage from suprapubic site, seen by Dr. Annabell Howells in office today, had catheter changed and then directed to the ED for further eval, concern for sepsis and Dr. Annabell Howells feels pt requires inpatient admission and medical treatment for cellulitis and possible sepsis.  Pt with redness in suprapubic region, slight purulent drainage from site.  It was explored by Dr. Annabell Howells today and did nto feel further exploration or surgery was acutely needed.    Gavin Pound. Oletta Lamas, MD 06/11/13 2026

## 2013-06-11 NOTE — ED Notes (Signed)
Tylenol at 1200n

## 2013-06-11 NOTE — Progress Notes (Signed)
  Pt admitted to the unit. Pt is stable, alert and oriented per baseline. Oriented to room, staff, and call bell. Educated to call for any assistance. Bed in lowest position, call bell within reach- will continue to monitor. 

## 2013-06-11 NOTE — ED Provider Notes (Signed)
CSN: [id number]     Arrival date & time 06/11/13  1645 History   First MD Initiated Contact with Patient 06/11/13 1751     Chief Complaint  Patient presents with  . 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  Patient currently has a suprapubic catheter in place.  He was seen in the office by Dr. Annabell Howells with Urology earlier today.  Suprapubic catheter was changed and the patient was sent to the ED due to concern for Sepsis. Patient does have an area of erythema and induration just inferior to the suprapubic catheter site.  Patient reports that he has noticed a small amount of purulent drainage from the catheter site for the past week.  Patient states that he began running a fever two days ago.  He denies cough, SOB, or chest pain.  He denies nausea, vomiting, diarrhea,or abdominal pain.    The history is provided by the patient.    Past Medical History  Diagnosis Date  . HTN (hypertension)   . Ureteral stone   . Colon cancer   . Shortness of breath   . Asthma   . Chronic kidney disease     acuts on chronic renal failure  . Arthritis   . PE (pulmonary embolism) 04/2013   Past Surgical History  Procedure Laterality Date  . Ureteral stent placement  04/10/2013  . Transurethral resection of prostate  04/10/2013  . Right colectomy  1996  . Tonsillectomy    . Cystoscopy with stent placement Bilateral 04/19/2013    Procedure: CYSTOSCOPY WITH STENT PLACEMENT;  Surgeon: Anner Crete, MD;  Location: [facility] OR;  Service: Urology;  Laterality: Bilateral;  . Cystoscopy N/A 04/19/2013    Procedure: CYSTOSCOPY WITH CLOT EVACUATION ;  Surgeon: Anner Crete,  MD;  Location: [facility] OR;  Service: Urology;  Laterality: N/A;  . Laparotomy N/A 04/19/2013    Procedure: EXPLORATORY LAPAROTOMY;  Surgeon: Anner Crete, MD;  Location: [facility] OR;  Service: Urology;  Laterality: N/A;  Exploratory Laparotomy with evacuation of blood clot in bladder with placement of super pubic tube   . Fasciotomy Right 05/14/2013    Procedure: FASCIOTOMY with wound vac placement;  Surgeon: Fransisco Hertz, MD;  Location: [facility] OR;  Service: Vascular;  Laterality: Right;  . Application of wound vac Right 05/14/2013    Procedure: APPLICATION OF WOUND VAC;  Surgeon: Fransisco Hertz, MD;  Location: [facility] OR;  Service: Vascular;  Laterality: Right;  . Fasciotomy Left 05/17/2013    Procedure: FASCIOTOMY LEG;  Surgeon: Sherren Kerns, MD;  Location: [facility] OR;  Service: Vascular;  Laterality: Left;  . Fasciotomy closure Right 05/19/2013    Procedure: RIGHT LOWER EXTREMITY CLOSURE OF LATERAL AND MEDIAL FASCIOTOMIES ;  Surgeon: Fransisco Hertz, MD;  Location: [facility] OR;  Service: Vascular;  Laterality: Right;  . Fasciotomy closure Left 05/21/2013    Procedure: FASCIOTOMY CLOSURE- LEFT LEG;  Surgeon: Fransisco Hertz, MD;  Location: [facility] OR;  Service: Vascular;  Laterality: Left;   Family History  Problem Relation Age of Onset  . Urolithiasis    . Deep vein thrombosis Mother   . Heart disease Sister    History  Substance Use Topics  . Smoking status: Never  Smoker   . Smokeless tobacco: Current User    Types: Chew  . Alcohol Use: Yes     Comment: OCCASSIONAL    Review of Systems  Constitutional: Positive for fever.  All other systems reviewed and are negative.    Allergies  Contrast media  Home Medications   Current Outpatient Rx  Name  Route  Sig  Dispense  Refill  . ALPRAZolam (XANAX) 0.25 MG tablet   Oral   Take 1 tablet (0.25 mg total) by mouth 3 (three) times daily as needed for anxiety.   10 tablet   0   . ferrous sulfate 325 (65 FE) MG tablet   Oral   Take 1 tablet (325 mg total) by mouth 2  (two) times daily with a meal.         . hydrocortisone (ANUSOL-HC) 2.5 % rectal cream   Rectal   Place rectally 2 (two) times daily.         . metroNIDAZOLE (FLAGYL) 500 MG tablet   Oral   Take 1 tablet (500 mg total) by mouth every 8 (eight) hours. Discontinue after 05/30/2013 doses.         Marland Kitchen oxyCODONE (OXY IR/ROXICODONE) 5 MG immediate release tablet   Oral   Take 1 tablet (5 mg total) by mouth every 6 (six) hours as needed for pain.   20 tablet   0   . Rivaroxaban (XARELTO) 15 MG TABS tablet   Oral   Take 1 tablet (15 mg total) by mouth 2 (two) times daily with a meal. 39 more doses. Ends 06/17/2013.         . Rivaroxaban (XARELTO) 20 MG TABS tablet   Oral   Take 1 tablet (20 mg total) by mouth daily with supper. Start first dose on Wed 06/18/13 at 1700. This has to be started after completing the twice daily dose.         . vitamin B-12 1000 MCG tablet   Oral   Take 1 tablet (1,000 mcg total) by mouth daily.          BP 123/72  Pulse 101  Temp(Src) 103.2 F (39.6 C)  Resp 18  Ht 5\' 11"  (1.803 m)  Wt 293 lb (132.904 kg)  BMI 40.88 kg/m2  SpO2 96% Physical Exam  Nursing note and vitals reviewed. Constitutional: He appears well-developed and well-nourished.  HENT:  Head: Normocephalic and atraumatic.  Mouth/Throat: Oropharynx is clear and moist.  Neck: Normal range of motion. Neck supple.  Cardiovascular: Normal rate, regular rhythm and normal heart sounds.   Pulmonary/Chest: Effort normal and breath sounds normal.  Abdominal: Soft. Bowel sounds are normal.    Musculoskeletal: Normal range of motion.  Neurological: He is alert.  Skin: Skin is warm and dry.  Psychiatric: He has a normal mood and affect.    ED Course  Procedures (including critical care time) Labs Review Labs Reviewed  CBC WITH DIFFERENTIAL - Abnormal; Notable for the following:    RBC 3.35 (*)    Hemoglobin 9.3 (*)    HCT 29.8 (*)    RDW 15.7 (*)    All other components  within normal limits  CULTURE, BLOOD (ROUTINE X 2)  CULTURE, BLOOD (ROUTINE X 2)  URINE CULTURE  COMPREHENSIVE METABOLIC PANEL  URINALYSIS, ROUTINE W REFLEX MICROSCOPIC  CG4 I-STAT (LACTIC ACID)   Imaging Review No results found.  EKG Interpretation   None       MDM  No diagnosis found. Patient with  a complicated history presents today for fever.  He was sent to the ED by Dr. Annabell Howells with Urology due to concern for Sepsis.  Patient found to have a normal lactate in the ED.  Blood cultures sent.  No acute findings on CXR.  Patient does have an area of Cellulitis surrounding the catheter site.  Patient started on broad spectrum Vancomycin and Zosyn to treat for Cellulitis.  Patient admitted to [facility] for further management.    Santiago Glad, PA-C 06/14/13 [phone]

## 2013-06-11 NOTE — ED Notes (Signed)
Room temp cooled as pt is febrile.Tapid sponge done.

## 2013-06-11 NOTE — H&P (Signed)
PCP:  Simone Curia, MD [facility] Urology: Annabell Howells  Chief Complaint:  fever  HPI: Jack [name] is a 67 y.o. male   has a past medical history of HTN (hypertension); Ureteral stone; Colon cancer; Shortness of breath; Asthma; Chronic kidney disease; Arthritis; PE (pulmonary embolism) (04/2013); DVT, bilateral lower limbs; and Compartment syndrome, traumatic, lower extremity.   Presented with  He have had very complicated recent medical history after TURP done at [facility] followed with multiple complications including kidney failure, bilateral DVT's with compartment syndrome requiring fasciotomy and PE currently on Xarelto. He is sp suprapubic catheter placed since august. Patient developed fever last night and was seen by urology today for suppurative discharge from the catheter site. The catheter has been replaced but given fever up to 103 he was sent to ER for work up of possible sepsis. Patient remained hemodynamically stable. ER started on vanc and zosyn and hospitalist called for admission.    Review of Systems:    Pertinent positives include:  Fevers, chills, wheezing.  Constitutional:  No weight loss, night sweats, , fatigue, weight loss  HEENT:  No headaches, Difficulty swallowing,Tooth/dental problems,Sore throat,  No sneezing, itching, ear ache, nasal congestion, post nasal drip,  Cardio-vascular:  No chest pain, Orthopnea, PND, anasarca, dizziness, palpitations.no Bilateral lower extremity swelling  GI:  No heartburn, indigestion, abdominal pain, nausea, vomiting, diarrhea, change in bowel habits, loss of appetite, melena, blood in stool, hematemesis Resp:  no shortness of breath at rest. No dyspnea on exertion, No excess mucus, no productive cough, No non-productive cough, No coughing up of blood.No change in color of mucus.No Skin:  no rash or lesions. No jaundice GU:  no dysuria, change in color of urine, no urgency or frequency. No straining to urinate.  No flank pain.   Musculoskeletal:  No joint pain or no joint swelling. No decreased range of motion. No back pain.  Psych:  No change in mood or affect. No depression or anxiety. No memory loss.  Neuro: no localizing neurological complaints, no tingling, no weakness, no double vision, no gait abnormality, no slurred speech, no confusion  Otherwise ROS are negative except for above, 10 systems were reviewed  Past Medical History: Past Medical History  Diagnosis Date  . HTN (hypertension)   . Ureteral stone   . Colon cancer   . Shortness of breath   . Asthma   . Chronic kidney disease     acuts on chronic renal failure  . Arthritis   . PE (pulmonary embolism) 04/2013  . DVT, bilateral lower limbs   . Compartment syndrome, traumatic, lower extremity    Past Surgical History  Procedure Laterality Date  . Ureteral stent placement  04/10/2013  . Transurethral resection of prostate  04/10/2013  . Right colectomy  1996  . Tonsillectomy    . Cystoscopy with stent placement Bilateral 04/19/2013    Procedure: CYSTOSCOPY WITH STENT PLACEMENT;  Surgeon: Anner Crete, MD;  Location: [facility] OR;  Service: Urology;  Laterality: Bilateral;  . Cystoscopy N/A 04/19/2013    Procedure: CYSTOSCOPY WITH CLOT EVACUATION ;  Surgeon: Anner Crete, MD;  Location: [facility] OR;  Service: Urology;  Laterality: N/A;  . Laparotomy N/A 04/19/2013    Procedure: EXPLORATORY LAPAROTOMY;  Surgeon: Anner Crete, MD;  Location: [facility] OR;  Service: Urology;  Laterality: N/A;  Exploratory Laparotomy with evacuation of blood clot in bladder with placement of super pubic tube   . Fasciotomy Right 05/14/2013    Procedure: FASCIOTOMY with wound  vac placement;  Surgeon: Fransisco Hertz, MD;  Location: [facility] OR;  Service: Vascular;  Laterality: Right;  . Application of wound vac Right 05/14/2013    Procedure: APPLICATION OF WOUND VAC;  Surgeon: Fransisco Hertz, MD;  Location: [facility] OR;  Service: Vascular;  Laterality: Right;  . Fasciotomy Left 05/17/2013    Procedure:  FASCIOTOMY LEG;  Surgeon: Sherren Kerns, MD;  Location: [facility] OR;  Service: Vascular;  Laterality: Left;  . Fasciotomy closure Right 05/19/2013    Procedure: RIGHT LOWER EXTREMITY CLOSURE OF LATERAL AND MEDIAL FASCIOTOMIES ;  Surgeon: Fransisco Hertz, MD;  Location: [facility] OR;  Service: Vascular;  Laterality: Right;  . Fasciotomy closure Left 05/21/2013    Procedure: FASCIOTOMY CLOSURE- LEFT LEG;  Surgeon: Fransisco Hertz, MD;  Location: [facility] OR;  Service: Vascular;  Laterality: Left;     Medications: Prior to Admission medications   Medication Sig Start Date End Date Taking? Authorizing Provider  acetaminophen (TYLENOL) 325 MG tablet Take 650 mg by mouth every 6 (six) hours as needed for pain.   Yes Historical Provider, MD  ALPRAZolam (XANAX) 0.25 MG tablet Take 1 tablet (0.25 mg total) by mouth 3 (three) times daily as needed for anxiety. 05/29/13  Yes Elease Etienne, MD  ferrous sulfate 325 (65 FE) MG tablet Take 1 tablet (325 mg total) by mouth 2 (two) times daily with a meal. 05/29/13  Yes Elease Etienne, MD  oxyCODONE (OXY IR/ROXICODONE) 5 MG immediate release tablet Take 1 tablet (5 mg total) by mouth every 6 (six) hours as needed for pain. 05/29/13  Yes Elease Etienne, MD  Rivaroxaban (XARELTO) 15 MG TABS tablet Take 1 tablet (15 mg total) by mouth 2 (two) times daily with a meal. 39 more doses. Ends 06/17/2013. 05/29/13  Yes Elease Etienne, MD  saccharomyces boulardii (FLORASTOR) 250 MG capsule Take 250 mg by mouth 2 (two) times daily. START 10.29.14   Yes Historical Provider, MD  sulfamethoxazole-trimethoprim (BACTRIM DS) 800-160 MG per tablet Take 1 tablet by mouth 2 (two) times daily. For 7 days, START 10.29.14   Yes Historical Provider, MD  vitamin B-12 1000 MCG tablet Take 1 tablet (1,000 mcg total) by mouth daily. 05/29/13  Yes Elease Etienne, MD  Rivaroxaban (XARELTO) 20 MG TABS tablet Take 1 tablet (20 mg total) by mouth daily with supper. Start first dose on Wed 06/18/13 at 1700. This  has to be started after completing the twice daily dose. 06/18/13   Elease Etienne, MD    Allergies:   Allergies  Allergen Reactions  . Contrast Media [Iodinated Diagnostic Agents]     On 04/29/2013 spoke with patient he had some type of procedure at [facility] and broke out in hives after the exams in 1997, he did not know what of type of study he had.  I called [facility] Radiology department the only type of contrast they used during that time  was Conray    Social History:  Ambulatory walker  Lives at [facility]   reports that he has never smoked. His smokeless tobacco use includes Chew. He reports that he drinks alcohol. He reports that he does not use illicit drugs.   Family History: family history includes Deep vein thrombosis in his mother; Heart disease in his sister; Urolithiasis in an other family member.    Physical Exam: Patient Vitals for the past 24 hrs:  BP Temp Pulse Resp SpO2 Height Weight  06/11/13 2100  124/55 mmHg - 96 16 98 % - -  06/11/13 2000 132/62 mmHg - 95 16 99 % - -  06/11/13 1905 - - - 18 - - -  06/11/13 1900 123/72 mmHg - 101 - 96 % - -  06/11/13 1700 - 103.2 F (39.6 C) - - - - -  06/11/13 1654 123/54 mmHg 102.4 F (39.1 C) 118 20 99 % 5\' 11"  (1.803 m) 132.904 kg (293 lb)    1. General:  in No Acute distress 2. Psychological: Alert and  Oriented 3. Head/ENT:   Moist  Mucous Membranes                          Head Non traumatic, neck supple                            Poor Dentition 4. SKIN: normal   Skin turgor,  Skin clean Dry lower extremities significant for bilateral wounds with staples there is some evidence of redness around the staple sites as well as a black eschar over some wounds slight odor noted 5. Heart: Regular rate and rhythm no Murmur, Rub or gallop 6. Lungs: Clear to auscultation bilaterally, no wheezes or crackles   7. Abdomen: Soft, non-tender, Non distended uterine discharge around suprapubic catheter  noted some redness surrounding it 8. Lower extremities: no clubbing, cyanosis, or edema 9. Neurologically Grossly intact, moving all 4 extremities equally 10. MSK: Normal range of motion  body mass index is 40.88 kg/(m^2).   Labs on Admission:   Recent Labs  06/11/13 1829  NA 138  K 4.0  CL 104  CO2 22  GLUCOSE 154*  BUN 11  CREATININE 1.14  CALCIUM 8.3*    Recent Labs  06/11/13 1829  AST 15  ALT 8  ALKPHOS 64  BILITOT 0.5  PROT 6.6  ALBUMIN 2.6*   No results found for this basename: LIPASE, AMYLASE,  in the last 72 hours  Recent Labs  06/11/13 1829  WBC 5.7  NEUTROABS 4.0  HGB 9.3*  HCT 29.8*  MCV 89.0  PLT 201   No results found for this basename: CKTOTAL, CKMB, CKMBINDEX, TROPONINI,  in the last 72 hours No results found for this basename: TSH, T4TOTAL, FREET3, T3FREE, THYROIDAB,  in the last 72 hours No results found for this basename: VITAMINB12, FOLATE, FERRITIN, TIBC, IRON, RETICCTPCT,  in the last 72 hours No results found for this basename: HGBA1C    Estimated Creatinine Clearance: 87.4 ml/min (by C-G formula based on Cr of 1.14). ABG    Component Value Date/Time   PHART 7.521* 04/27/2013 1238   HCO3 18.4* 04/27/2013 1238   TCO2 19 04/27/2013 1238   ACIDBASEDEF 3.0* 04/27/2013 1238   O2SAT 99.0 04/27/2013 1238     Lab Results  Component Value Date   DDIMER 3.32* 05/17/2013      UA evidence of UTI and hematouria   Cultures:    Component Value Date/Time   SDES URINE, CLEAN CATCH 05/27/2013 1422   SPECREQUEST NONE 05/27/2013 1422   CULT  Value: NO GROWTH Performed at [facility] 05/27/2013 1422   REPTSTATUS 05/28/2013 FINAL 05/27/2013 1422       Radiological Exams on Admission: Dg Chest 2 View  06/11/2013   CLINICAL DATA:  Fever. Sepsis.  EXAM: CHEST  2 VIEW  COMPARISON:  05/13/2013.  FINDINGS: The heart size and mediastinal contours are within normal  limits. Both lungs are clear. The visualized skeletal structures are  unremarkable. Old right-sided rib fractures.  IMPRESSION: No active cardiopulmonary disease.   Electronically Signed   By: Andreas Newport M.D.   On: 06/11/2013 19:57    Chart has been reviewed  Assessment/Plan  This is a 66 year old gentleman with past history significant for DVTs and compartment syndrome status post fasciotomy performed by Dr. Johny Drilling as well as history of urinary retention status post suprapubic catheter. Presents with cellulitis at the suprapubic catheter site as well as possible wound infection  Present on Admission:  . SIRS (systemic inflammatory response syndrome) - will admit obtain blood cultures urine cultures put on broad-spectrum antibiotics including Zosyn IV fluids lactated this point within normal limits hemodynamically stable no evidence of septic shock  . DVT, bilateral lower limbs -  Continue Xarelto . Cellulitis - cover broadly vancomycin and Zosyn obtain wound cultures await results of blood culture  . Chronic renal insufficiency - currently creatinine and normal limits no continue to monitor  . Anemia - obtain anemia panel     Prophylaxis: Xarelto  Protonix  CODE STATUS: FULL CODE but does not wish to have prolonged measures  Other plan as per orders.  I have spent a total of 55 min on this admission  Delila Kuklinski 06/11/2013, 9:49 PM

## 2013-06-11 NOTE — ED Notes (Signed)
The pt has a new supra pubic catheter placed today by the urologist and was sent here because he has redness and  Swelling around the supra pubic cath site just noticed today with a high temp all day.  He does not think he had a temp yesterday

## 2013-06-11 NOTE — Progress Notes (Signed)
ANTIBIOTIC CONSULT NOTE - INITIAL  Pharmacy Consult for vancomycin and zosyn Indication: Cellulitis  Allergies  Allergen Reactions  . Contrast Media [Iodinated Diagnostic Agents]     On 04/29/2013 spoke with patient he had some type of procedure at [facility] and broke out in hives after the exams in 1997, he did not know what of type of study he had.  I called [facility] Radiology department the only type of contrast they used during that time  was Conray    Patient Measurements: Height: 5\' 11"  (180.3 cm) Weight: 293 lb (132.904 kg) IBW/kg (Calculated) : 75.3   Vital Signs: Temp: 103.2 F (39.6 C) (10/29 1700) BP: 123/54 mmHg (10/29 1654) Pulse Rate: 118 (10/29 1654) Intake/Output from previous day:   Intake/Output from this shift:    Labs:  Recent Labs  06/11/13 1829  WBC 5.7  HGB 9.3*  PLT 201   Estimated Creatinine Clearance: 101.7 ml/min (by C-G formula based on Cr of 0.98). No results found for this basename: VANCOTROUGH, Leodis Binet, VANCORANDOM, GENTTROUGH, GENTPEAK, GENTRANDOM, TOBRATROUGH, TOBRAPEAK, TOBRARND, AMIKACINPEAK, AMIKACINTROU, AMIKACIN,  in the last 72 hours   Microbiology: Recent Results (from the past 720 hour(s))  CLOSTRIDIUM DIFFICILE BY PCR     Status: Abnormal   Collection Time    05/16/13  2:34 AM      Result Value Range Status   C difficile by pcr POSITIVE (*) NEGATIVE Final   Comment: CRITICAL RESULT CALLED TO, READ BACK BY AND VERIFIED WITH:     Pennelope Bracken 05/16/13 1120 BY K SCHULTZ  MRSA PCR SCREENING     Status: None   Collection Time    05/16/13  3:26 PM      Result Value Range Status   MRSA by PCR NEGATIVE  NEGATIVE Final   Comment:            The GeneXpert MRSA Assay (FDA     approved for NASAL specimens     only), is one component of a     comprehensive MRSA colonization     surveillance program. It is not     intended to diagnose MRSA     infection nor to guide or     monitor treatment for     MRSA infections.   URINE CULTURE     Status: None   Collection Time    05/27/13  2:22 PM      Result Value Range Status   Specimen Description URINE, CLEAN CATCH   Final   Special Requests NONE   Final   Culture  Setup Time     Final   Value: 05/27/2013 15:49     Performed at [facility] Count     Final   Value: NO GROWTH     Performed at [facility]   Culture     Final   Value: NO GROWTH     Performed at [facility]   Report Status 05/28/2013 FINAL   Final    Medical History: Past Medical History  Diagnosis Date  . HTN (hypertension)   . Ureteral stone   . Colon cancer   . Shortness of breath   . Asthma   . Chronic kidney disease     acuts on chronic renal failure  . Arthritis   . PE (pulmonary embolism) 04/2013    Medications:  See med rec  Assessment: Patient is a 67 y.o M who was recently hospitalized back in September  with a complicated hospital course.  He had a suprapubic cath placed today and now referred to the ED secondary to "redness and swelling" around cath site and fever. To start broad abx with vancomycin and zosyn for empiric coverage. Spoke to [employer] ([place]), treating patient with abx for cellulitis indication for now as patient does not "appear to be septic."  Goal of Therapy:  Vancomycin trough 10-15  Plan:  1) vancomycin 2500 mg IV x1, then 1250mg  IV q12h 2) will plan on checking level at steady state 3) zosyn 3.375gm x1 over 30 min , then q8h (infuse over 4 hours).  Ranvir Renovato P 06/11/2013,7:05 PM

## 2013-06-12 ENCOUNTER — Encounter (HOSPITAL_COMMUNITY): Payer: Self-pay | Admitting: General Practice

## 2013-06-12 DIAGNOSIS — N189 Chronic kidney disease, unspecified: Secondary | ICD-10-CM

## 2013-06-12 DIAGNOSIS — A419 Sepsis, unspecified organism: Secondary | ICD-10-CM

## 2013-06-12 DIAGNOSIS — N390 Urinary tract infection, site not specified: Secondary | ICD-10-CM | POA: Diagnosis present

## 2013-06-12 LAB — COMPREHENSIVE METABOLIC PANEL
ALT: 6 U/L (ref 0–53)
BUN: 11 mg/dL (ref 6–23)
CO2: 25 mEq/L (ref 19–32)
Calcium: 8.1 mg/dL — ABNORMAL LOW (ref 8.4–10.5)
Creatinine, Ser: 1.14 mg/dL (ref 0.50–1.35)
GFR calc Af Amer: 75 mL/min — ABNORMAL LOW (ref >90)
GFR calc non Af Amer: 65 mL/min — ABNORMAL LOW (ref >90)
Glucose, Bld: 117 mg/dL — ABNORMAL HIGH (ref 70–99)
Sodium: 139 mEq/L (ref 135–145)
Total Protein: 5.6 g/dL — ABNORMAL LOW (ref 6.0–8.3)

## 2013-06-12 LAB — FERRITIN: Ferritin: 982 ng/mL — ABNORMAL HIGH (ref 22–322)

## 2013-06-12 LAB — IRON AND TIBC: UIBC: 108 ug/dL — ABNORMAL LOW (ref 125–400)

## 2013-06-12 LAB — RETICULOCYTES
RBC.: 2.89 MIL/uL — ABNORMAL LOW (ref 4.22–5.81)
Retic Count, Absolute: 83.8 10*3/uL (ref 19.0–186.0)
Retic Ct Pct: 2.9 % (ref 0.4–3.1)

## 2013-06-12 LAB — CBC
Hemoglobin: 8 g/dL — ABNORMAL LOW (ref 13.0–17.0)
MCH: 27.7 pg (ref 26.0–34.0)
MCHC: 31 g/dL (ref 30.0–36.0)
Platelets: 165 10*3/uL (ref 150–400)

## 2013-06-12 LAB — VITAMIN B12: Vitamin B-12: 321 pg/mL (ref 211–911)

## 2013-06-12 MED ORDER — SODIUM CHLORIDE 0.9 % IV SOLN
1500.0000 mg | Freq: Once | INTRAVENOUS | Status: AC
  Administered 2013-06-12: 1500 mg via INTRAVENOUS
  Filled 2013-06-12 (×2): qty 30

## 2013-06-12 MED ORDER — ADULT MULTIVITAMIN W/MINERALS CH
1.0000 | ORAL_TABLET | Freq: Every day | ORAL | Status: DC
  Administered 2013-06-12 – 2013-06-17 (×6): 1 via ORAL
  Filled 2013-06-12 (×6): qty 1

## 2013-06-12 MED ORDER — POLYSACCHARIDE IRON COMPLEX 150 MG PO CAPS
150.0000 mg | ORAL_CAPSULE | Freq: Every day | ORAL | Status: DC
  Administered 2013-06-12 – 2013-06-17 (×6): 150 mg via ORAL
  Filled 2013-06-12 (×6): qty 1

## 2013-06-12 MED ORDER — ACETAMINOPHEN 500 MG PO TABS
500.0000 mg | ORAL_TABLET | ORAL | Status: AC
  Administered 2013-06-12: 500 mg via ORAL
  Filled 2013-06-12: qty 1

## 2013-06-12 MED ORDER — SODIUM CHLORIDE 0.9 % IV BOLUS (SEPSIS)
500.0000 mL | Freq: Once | INTRAVENOUS | Status: AC
  Administered 2013-06-12: 04:00:00 500 mL via INTRAVENOUS

## 2013-06-12 MED ORDER — SODIUM CHLORIDE 0.9 % IV SOLN
25.0000 mg | Freq: Once | INTRAVENOUS | Status: AC
  Administered 2013-06-12: 25 mg via INTRAVENOUS
  Filled 2013-06-12: qty 0.5

## 2013-06-12 NOTE — Progress Notes (Addendum)
[facility] POST OPERATIVE NOTE  CC:  Readmitted last night for fevers/sepsis  HPI:  This is a 67 y.o. male who is s/p bilateral fasciotomies in early October by Dr. Imogene Burn and Dr. Darrick Penna.  He did have wound vacs and then the wounds were able to be closed.  The staples are still in tact.  He also had a suprapubic catheter placed for urinary retention.  He was readmitted last night with high fever and cellulitis at the suprapubic catheter site as well as possible wound infection.  We are asked to evaluate leg wounds.  Allergies  Allergen Reactions  . Contrast Media [Iodinated Diagnostic Agents]     On 04/29/2013 spoke with patient he had some type of procedure at [facility] and broke out in hives after the exams in 1997, he did not know what of type of study he had.  I called [facility] Radiology department the only type of contrast they used during that time  was [place]    Current Facility-Administered Medications  Medication Dose Route Frequency Provider Last Rate Last Dose  . 0.9 %  sodium chloride infusion   Intravenous Continuous Therisa Doyne, MD 75 mL/hr at 06/12/13 0008    . acetaminophen (TYLENOL) tablet 650 mg  650 mg Oral Q6H PRN Therisa Doyne, MD   650 mg at 06/12/13 0144   Or  . acetaminophen (TYLENOL) suppository 650 mg  650 mg Rectal Q6H PRN Therisa Doyne, MD      . albuterol (PROVENTIL) (5 MG/ML) 0.5% nebulizer solution 2.5 mg  2.5 mg Nebulization Q2H PRN Therisa Doyne, MD      . ALPRAZolam Prudy Feeler) tablet 0.25 mg  0.25 mg Oral TID PRN Therisa Doyne, MD      . docusate sodium (COLACE) capsule 100 mg  100 mg Oral BID Therisa Doyne, MD      . morphine 2 MG/ML injection 2 mg  2 mg Intravenous Q4H PRN Therisa Doyne, MD      . ondansetron (ZOFRAN) tablet 4 mg  4 mg Oral Q6H PRN Therisa Doyne, MD       Or  . ondansetron (ZOFRAN) injection 4 mg  4 mg Intravenous Q6H PRN Therisa Doyne, MD      . oxyCODONE (Oxy  IR/ROXICODONE) immediate release tablet 5 mg  5 mg Oral Q6H PRN Therisa Doyne, MD      . pantoprazole (PROTONIX) EC tablet 40 mg  40 mg Oral Q1200 Therisa Doyne, MD      . piperacillin-tazobactam (ZOSYN) IVPB 3.375 g  3.375 g Intravenous Q8H Therisa Doyne, MD   3.375 g at 06/12/13 0549  . Rivaroxaban (XARELTO) tablet 15 mg  15 mg Oral BID WC Therisa Doyne, MD   15 mg at 06/12/13 0820  . [START ON 06/18/2013] Rivaroxaban (XARELTO) tablet 20 mg  20 mg Oral Q supper Clydia Llano, MD      . sodium chloride 0.9 % injection 3 mL  3 mL Intravenous Q12H Therisa Doyne, MD   3 mL at 06/12/13 0011  . vancomycin (VANCOCIN) 1,250 mg in sodium chloride 0.9 % 250 mL IVPB  1,250 mg Intravenous Q12H Anh P Pham, RPH   1,250 mg at 06/12/13 0819     ROS:  See HPI  Physical Exam:  Filed Vitals:   06/12/13 0511  BP: 131/58  Pulse: 80  Temp: 98.6 F (37 C)  Resp: 20    Incision:  BLE incisions are healing nicely with staples and some sutures in tact.  Extremities:  3+ palpable DP pulses bilaterally.  Still with BLE edema   A/P:  This is a 67 y.o. male here for fever and cellulitis of suprapubic catheter.  -his legs are evaluated today and these wounds are not the source of his sepsis and are healing nicely.  He does have palpable DP pulses bilaterally. -although his legs are still edematous, they are improved from his prior hospitalization.  Keep legs elevated for swelling. -we will take out every other staple today as well as the sutures and the rest of the staples in a couple of days.   Doreatha Massed, PA-C [facility] [phone]    Agree with above Lower extremities are improving with time with decreased edema Will remove skin staples and sutures over the next 3 days

## 2013-06-12 NOTE — Progress Notes (Signed)
Pt came to unit with fever of 102. When RN asked about plans to reduce fever- ED nurse replied that they had given pt tylenol and a cold towel. His fever remained. She advised that we just do the best that we can because they tried everything they could to get it down. Will continue monitor.

## 2013-06-12 NOTE — Consult Note (Signed)
Subjective: Mr. Jack [name] is feeling better with less SP discomfort.   He has voided some despite the SP tube which is draining.  His urine remains blood tinged.   He continues to have fever. ROS: Negative except as above.   Objective: Vital signs in last 24 hours: Temp:  [98.4 F (36.9 C)-102.6 F (39.2 C)] 102.1 F (38.9 C) (10/30 1726) Pulse Rate:  [76-105] 89 (10/30 1726) Resp:  [16-30] 18 (10/30 1726) BP: (123-152)/(55-72) 133/58 mmHg (10/30 1726) SpO2:  [95 %-99 %] 95 % (10/30 1726) FiO2 (%):  [28 %] 28 % (10/29 2342) Weight:  [119.6 kg (263 lb 10.7 oz)] 119.6 kg (263 lb 10.7 oz) (10/29 2351)  Intake/Output from previous day: 10/29 0701 - 10/30 0700 In: 100 [IV Piggyback:100] Out: 600 [Urine:600] Intake/Output this shift: Total I/O In: 250 [IV Piggyback:250] Out: 1450 [Urine:1450]  General appearance: alert and no distress Incision/Wound: There is reduced erythema and weepiness.  Lab Results:   Recent Labs  06/11/13 1829 06/12/13 0449  WBC 5.7 4.6  HGB 9.3* 8.0*  HCT 29.8* 25.8*  PLT 201 165   BMET  Recent Labs  06/11/13 1829 06/12/13 0449  NA 138 139  K 4.0 3.8  CL 104 106  CO2 22 25  GLUCOSE 154* 117*  BUN 11 11  CREATININE 1.14 1.14  CALCIUM 8.3* 8.1*   PT/INR No results found for this basename: LABPROT, INR,  in the last 72 hours ABG No results found for this basename: PHART, PCO2, PO2, HCO3,  in the last 72 hours  Studies/Results: Dg Chest 2 View  06/11/2013   CLINICAL DATA:  Fever. Sepsis.  EXAM: CHEST  2 VIEW  COMPARISON:  05/13/2013.  FINDINGS: The heart size and mediastinal contours are within normal limits. Both lungs are clear. The visualized skeletal structures are unremarkable. Old right-sided rib fractures.  IMPRESSION: No active cardiopulmonary disease.   Electronically Signed   By: Andreas Newport M.D.   On: 06/11/2013 19:57    Anti-infectives: Anti-infectives   Start     Dose/Rate Route Frequency Ordered Stop   06/12/13 0800   vancomycin (VANCOCIN) 1,250 mg in sodium chloride 0.9 % 250 mL IVPB     1,250 mg 166.7 mL/hr over 90 Minutes Intravenous Every 12 hours 06/11/13 1947     06/12/13 0200  piperacillin-tazobactam (ZOSYN) IVPB 3.375 g  Status:  Discontinued     3.375 g 12.5 mL/hr over 240 Minutes Intravenous Every 8 hours 06/11/13 1947 06/11/13 2341   06/12/13 0100  piperacillin-tazobactam (ZOSYN) IVPB 3.375 g     3.375 g 12.5 mL/hr over 240 Minutes Intravenous 3 times per day 06/11/13 2341     06/11/13 2015  vancomycin (VANCOCIN) 2,500 mg in sodium chloride 0.9 % 500 mL IVPB     2,500 mg 250 mL/hr over 120 Minutes Intravenous  Once 06/11/13 2001 06/11/13 2232   06/11/13 2000  vancomycin (VANCOCIN) 2,500 mg in sodium chloride 0.9 % 500 mL IVPB  Status:  Discontinued     2,500 mg 250 mL/hr over 120 Minutes Intravenous  Once 06/11/13 1947 06/11/13 1951   06/11/13 2000  vancomycin (VANCOCIN) 1,500 mg in sodium chloride 0.9 % 500 mL IVPB  Status:  Discontinued     1,500 mg 250 mL/hr over 120 Minutes Intravenous  Once 06/11/13 1951 06/11/13 2001   06/11/13 1915  piperacillin-tazobactam (ZOSYN) IVPB 3.375 g     3.375 g 100 mL/hr over 30 Minutes Intravenous  Once 06/11/13 1901 06/11/13 2030   06/11/13  1915  vancomycin (VANCOCIN) IVPB 1000 mg/200 mL premix  Status:  Discontinued     1,000 mg 200 mL/hr over 60 Minutes Intravenous  Once 06/11/13 1901 06/11/13 1944      Current Facility-Administered Medications  Medication Dose Route Frequency Provider Last Rate Last Dose  . acetaminophen (TYLENOL) tablet 650 mg  650 mg Oral Q6H PRN Therisa Doyne, MD   650 mg at 06/12/13 1736   Or  . acetaminophen (TYLENOL) suppository 650 mg  650 mg Rectal Q6H PRN Therisa Doyne, MD      . albuterol (PROVENTIL) (5 MG/ML) 0.5% nebulizer solution 2.5 mg  2.5 mg Nebulization Q2H PRN Therisa Doyne, MD      . ALPRAZolam Prudy Feeler) tablet 0.25 mg  0.25 mg Oral TID PRN Therisa Doyne, MD      . docusate sodium (COLACE)  capsule 100 mg  100 mg Oral BID Therisa Doyne, MD   100 mg at 06/12/13 1030  . iron polysaccharides (NIFEREX) capsule 150 mg  150 mg Oral Daily Stephani Police, PA-C   150 mg at 06/12/13 1611  . morphine 2 MG/ML injection 2 mg  2 mg Intravenous Q4H PRN Therisa Doyne, MD      . multivitamin with minerals tablet 1 tablet  1 tablet Oral Daily Haynes Bast, RD   1 tablet at 06/12/13 1611  . ondansetron (ZOFRAN) tablet 4 mg  4 mg Oral Q6H PRN Therisa Doyne, MD       Or  . ondansetron (ZOFRAN) injection 4 mg  4 mg Intravenous Q6H PRN Therisa Doyne, MD      . oxyCODONE (Oxy IR/ROXICODONE) immediate release tablet 5 mg  5 mg Oral Q6H PRN Therisa Doyne, MD      . pantoprazole (PROTONIX) EC tablet 40 mg  40 mg Oral Q1200 Therisa Doyne, MD   40 mg at 06/12/13 1259  . piperacillin-tazobactam (ZOSYN) IVPB 3.375 g  3.375 g Intravenous Q8H Therisa Doyne, MD   3.375 g at 06/12/13 1411  . Rivaroxaban (XARELTO) tablet 15 mg  15 mg Oral BID WC Therisa Doyne, MD   15 mg at 06/12/13 1611  . [START ON 06/18/2013] Rivaroxaban (XARELTO) tablet 20 mg  20 mg Oral Q supper Clydia Llano, MD      . sodium chloride 0.9 % injection 3 mL  3 mL Intravenous Q12H Therisa Doyne, MD   3 mL at 06/12/13 0011  . vancomycin (VANCOCIN) 1,250 mg in sodium chloride 0.9 % 250 mL IVPB  1,250 mg Intravenous Q12H Anh P Pham, RPH   1,250 mg at 06/12/13 9604    Assessment: The cellulitis appears to be improving and he is having less pain.  He has voided some even with the SP tube.   Plan: Once his fever resolves and the urine clears, we could clamp the SP tube for a voiding trial. He will need his ureteral stents removed in the near future. It might be worthwhile to consider a repeat CT to reassess his renal hematoma and his wound, particularly if the fever doesn't abate.    LOS: 1 day    Tenessa Marsee J 06/12/2013

## 2013-06-12 NOTE — Progress Notes (Signed)
INITIAL NUTRITION ASSESSMENT  DOCUMENTATION CODES Per approved criteria  -Obesity Unspecified   INTERVENTION: Encouraged protein intake for adequate healing. Add MVI. RD to continue to follow nutrition care plan.  NUTRITION DIAGNOSIS: Increased nutrient needs related to wound healing as evidenced by estimated needs.   Goal: Intake to meet >90% of estimated nutrition needs.  Monitor:  weight trends, lab trends, I/O's, PO intake, supplement tolerance  Reason for Assessment: Malnutrition Screening Tool  67 y.o. 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 Recent suprapubic catheter replacement. Admitted with fever and cellulitis at catheter site. Work-up reveals SIRS.  Pt currently afebrile. Ordered for a Regular diet. Pt states that he is eating well - thinks he is getting too much food, declines supplements.  VVS evaluated patient's leg wounds 10/30 and noted that they are "healing nicely." Plan is to remove skin staples and sutures over the next 3 days. Pt reports that he is eating well, denies any weight changes. Per EPIC, weights have been relatively stable. Encouraged protein intake.  From SNF.  Height: Ht Readings from Last 1 Encounters:  06/11/13 6' (1.829 m)    Weight: Wt Readings from Last 1 Encounters:  06/11/13 263 lb 10.7 oz (119.6 kg)    Ideal Body Weight: 178 lb  % Ideal Body Weight: 148%  Wt Readings from Last 10 Encounters:  06/11/13 263 lb 10.7 oz (119.6 kg)  05/28/13 278 lb 14.4 oz (126.508 kg)  05/28/13 278 lb 14.4 oz (126.508 kg)  05/28/13 278 lb 14.4 oz (126.508 kg)  05/28/13 278 lb 14.4 oz (126.508 kg)  05/28/13 278 lb 14.4 oz (126.508 kg)  04/23/13 274 lb (124.286 kg)  04/23/13 274 lb (124.286 kg)    Usual Body Weight: 274 lb  % Usual Body Weight: 96%  BMI:  Body mass index is 35.75  kg/(m^2). Obese Class II  Estimated Nutritional Needs: Kcal: 2100 - 2300 Protein: 110 - 130 g Fluid: 2.1 - 2.3 liters  Skin:  Abdomen incision R leg incision L leg incision  Diet Order: General  EDUCATION NEEDS: -No education needs identified at this time   Intake/Output Summary (Last 24 hours) at 06/12/13 1337 Last data filed at 06/12/13 1156  Gross per 24 hour  Intake    350 ml  Output   1600 ml  Net  -1250 ml    Last BM: 10/30  Labs:   Recent Labs Lab 06/11/13 1829 06/12/13 0449  NA 138 139  K 4.0 3.8  CL 104 106  CO2 22 25  BUN 11 11  CREATININE 1.14 1.14  CALCIUM 8.3* 8.1*  MG  --  1.7  PHOS  --  3.8  GLUCOSE 154* 117*    CBG (last 3)  No results found for this basename: GLUCAP,  in the last 72 hours  Scheduled Meds: . docusate sodium  100 mg Oral BID  . pantoprazole  40 mg Oral Q1200  . piperacillin-tazobactam (ZOSYN)  IV  3.375 g Intravenous Q8H  . Rivaroxaban  15 mg Oral BID WC  . [START ON 06/18/2013] rivaroxaban  20 mg Oral Q supper  . sodium chloride  3 mL Intravenous Q12H  . vancomycin  1,250 mg Intravenous Q12H    Continuous Infusions:   Past Medical History  Diagnosis Date  . HTN (hypertension)   . Ureteral stone   . Colon cancer   . Shortness of breath   .  Asthma   . Chronic kidney disease     acuts on chronic renal failure  . Arthritis   . PE (pulmonary embolism) 04/2013  . DVT, bilateral lower limbs   . Compartment syndrome, traumatic, lower extremity     Past Surgical History  Procedure Laterality Date  . Ureteral stent placement  04/10/2013  . Transurethral resection of prostate  04/10/2013  . Right colectomy  1996  . Tonsillectomy    . Cystoscopy with stent placement Bilateral 04/19/2013    Procedure: CYSTOSCOPY WITH STENT PLACEMENT;  Surgeon: Anner Crete, MD;  Location: [facility] OR;  Service: Urology;  Laterality: Bilateral;  . Cystoscopy N/A 04/19/2013    Procedure: CYSTOSCOPY WITH CLOT EVACUATION ;  Surgeon: Anner Crete,  MD;  Location: [facility] OR;  Service: Urology;  Laterality: N/A;  . Laparotomy N/A 04/19/2013    Procedure: EXPLORATORY LAPAROTOMY;  Surgeon: Anner Crete, MD;  Location: [facility] OR;  Service: Urology;  Laterality: N/A;  Exploratory Laparotomy with evacuation of blood clot in bladder with placement of super pubic tube   . Fasciotomy Right 05/14/2013    Procedure: FASCIOTOMY with wound vac placement;  Surgeon: Fransisco Hertz, MD;  Location: [facility] OR;  Service: Vascular;  Laterality: Right;  . Application of wound vac Right 05/14/2013    Procedure: APPLICATION OF WOUND VAC;  Surgeon: Fransisco Hertz, MD;  Location: [facility] OR;  Service: Vascular;  Laterality: Right;  . Fasciotomy Left 05/17/2013    Procedure: FASCIOTOMY LEG;  Surgeon: Sherren Kerns, MD;  Location: [facility] OR;  Service: Vascular;  Laterality: Left;  . Fasciotomy closure Right 05/19/2013    Procedure: RIGHT LOWER EXTREMITY CLOSURE OF LATERAL AND MEDIAL FASCIOTOMIES ;  Surgeon: Fransisco Hertz, MD;  Location:  [facility] OR;  Service: Vascular;  Laterality: Right;  . Fasciotomy closure Left 05/21/2013    Procedure: FASCIOTOMY CLOSURE- LEFT LEG;  Surgeon: Fransisco Hertz, MD;  Location: [facility] OR;  Service: Vascular;  Laterality: Left;    Jarold Motto MS, RD, LDN Pager: [phone] After-hours pager: [phone]

## 2013-06-12 NOTE — Consult Note (Addendum)
WOC wound consult note Reason for Consult:Consult requested for bilat legs.  Pt had surgery by Dr Imogene Burn on 10/8 for closure of  4 fasciotomy sites to BLE according to progress notes.  Wound type: Full thickness post-op sites; medical and lateral on BLE.  Staples intact to all areas, will approximated without odor or drainage.  Patchy areas of eschar on edges of incisions in several locations. Dressing procedure/placement/frequency: PLEASE CONSULT VVS SURGEON since this involves post-op assessment and plan of care.  Discussed plan of care with primary team medical student in room. Please re-consult if further assistance is needed.  Thank-you,  Cammie Mcgee MSN, RN, CWOCN, [place], CNS [phone]

## 2013-06-12 NOTE — Progress Notes (Signed)
Addendum  Patient seen and examined, chart and data base reviewed.  I agree with the above assessment and plan.  For full details please see Mrs. Algis Downs PA note.  Sepsis secondary to UTI. Blood culture pending to rule out septicemia.  Status post bilateral fasciotomy of lower extremity, Dr. Imogene Burn is following.   Clint Lipps, MD [facility] Pager: [phone] 06/12/2013, 2:07 PM

## 2013-06-12 NOTE — Progress Notes (Signed)
Pt continues to have temp above 102. Pt has been given tylenol. RN has handed pt cold towel. Contacting night coverage night. Will continue to monitor

## 2013-06-12 NOTE — Progress Notes (Signed)
[facility] PROGRESS NOTE  Jack [name] ZOX:[id number] DOB: [birth year] DOA: 06/11/2013 PCP: Simone Curia, MD  67 yo male just released from SNF after CIR stay.  Has recent history of Turp s/p suprapubic catheter, bilateral LE DVTs which developed compartment syndrome and required bilateral fasciotomies.   Assessment/Plan:  SIRS  - likely secondary to UTI -Still with rigors and chills this am -Fever 103.2 this am. -Receiving vanc and zosyn per pharmacy  UTI with suprapubic catheter. -Catheter changed just prior to admission by Urology -Urine cultures pending. -on Zosyn  Cellulitis around suprapubic catheter -On vanc and zosyn.   Recent Bilateral DVT (05/2013) -Continue [place] Ramp  S/P bilateral fasciotomies of lower extremities -Dr. Imogene Burn consulted and recommendations made -Wound care consultation completed -Every other staple to be removed today.  Chronic renal insufficiency Creatinine at baseline.  Anemia (normocytic) -appears at baseline -no active bleeding. -Iron deficient.  (Ferritin elevated) -start iron supplementation.  Discuss IV iron with attending.    DVT Prophylaxis:  xeralto  Code Status: full code Family Communication: Disposition Plan: inpatient   Consultants:  Vascular and Vein - Dr. Imogene Burn  Procedures:    Antibiotics: Vanc and Zosyn started at admission  HPI/Subjective: Complains of being cold.  Reports fever and sweats over night.   Objective: Filed Vitals:   06/12/13 0150 06/12/13 0434 06/12/13 0511 06/12/13 0900  BP:   131/58 128/60  Pulse:   80 76  Temp: 102.4 F (39.1 C) 98.8 F (37.1 C) 98.6 F (37 C) 98.4 F (36.9 C)  TempSrc: Oral Oral Oral Oral  Resp:   20 18  Height:      Weight:      SpO2:   98% 98%    Intake/Output Summary (Last 24 hours) at 06/12/13 1359 Last data filed at 06/12/13 1156  Gross per 24 hour  Intake    350 ml  Output   1600 ml  Net  -1250 ml   Filed Weights   06/11/13 1654 06/11/13 2351   Weight: 132.904 kg (293 lb) 119.6 kg (263 lb 10.7 oz)    Exam:   General:  Wd, male, pleasant, A&O, NAD, with rigors  Cardiovascular: RRR, no murmurs, rubs or gallops,  Respiratory: CTA, no wheeze, crackles, or rales.  No increased work of breathing.  Abdomen: Soft, non-tender, non-distended, + bowel sounds, no masses.  Suprapubic cath in place.  No evident discharge at this time.  Musculoskeletal: Able to move all 4 extremities, 5/5 strength in each  Skin:  Lower extremities are wrapped in gauze (clean and dry).  Area approx 4" in length to the left of the suprapubic catheter - bright red.  Data Reviewed: Basic Metabolic Panel:  Recent Labs Lab 06/11/13 1829 06/12/13 0449  NA 138 139  K 4.0 3.8  CL 104 106  CO2 22 25  GLUCOSE 154* 117*  BUN 11 11  CREATININE 1.14 1.14  CALCIUM 8.3* 8.1*  MG  --  1.7  PHOS  --  3.8   Liver Function Tests:  Recent Labs Lab 06/11/13 1829 06/12/13 0449  AST 15 10  ALT 8 6  ALKPHOS 64 52  BILITOT 0.5 0.5  PROT 6.6 5.6*  ALBUMIN 2.6* 2.2*   CBC:  Recent Labs Lab 06/11/13 1829 06/12/13 0449  WBC 5.7 4.6  NEUTROABS 4.0  --   HGB 9.3* 8.0*  HCT 29.8* 25.8*  MCV 89.0 89.3  PLT 201 165   BNP (last 3 results)  Recent Labs  04/18/13 1245 04/27/13 1240  PROBNP 1762.0* 1260.0*     Studies: Dg Chest 2 View  06/11/2013   CLINICAL DATA:  Fever. Sepsis.  EXAM: CHEST  2 VIEW  COMPARISON:  05/13/2013.  FINDINGS: The heart size and mediastinal contours are within normal limits. Both lungs are clear. The visualized skeletal structures are unremarkable. Old right-sided rib fractures.  IMPRESSION: No active cardiopulmonary disease.   Electronically Signed   By: Andreas Newport M.D.   On: 06/11/2013 19:57    Scheduled Meds: . docusate sodium  100 mg Oral BID  . pantoprazole  40 mg Oral Q1200  . piperacillin-tazobactam (ZOSYN)  IV  3.375 g Intravenous Q8H  . Rivaroxaban  15 mg Oral BID WC  . [START ON 06/18/2013] rivaroxaban   20 mg Oral Q supper  . sodium chloride  3 mL Intravenous Q12H  . vancomycin  1,250 mg Intravenous Q12H   Continuous Infusions:   Principal Problem:   UTI (urinary tract infection) Active Problems:   Chronic renal insufficiency   DVT, bilateral lower limbs   Cellulitis   Anemia   SIRS (systemic inflammatory response syndrome)    Conley Canal  [facility] Pager [phone]. If 7PM-7AM, please contact night-coverage at [email or web address], password [email or web address] 06/12/2013, 1:59 PM  LOS: 1 day

## 2013-06-12 NOTE — Progress Notes (Signed)
Pharmacy - IV Iron  HgB today = 8 Continues on Xarelto for DVTs  Iron deficit calculated as 1600 mg  Plan: 1) Iron test dose of 25 mg 2) Iron dose of 1500 mg IV x 1 3) Continue to follow  Thank you. Okey Regal, PharmD

## 2013-06-13 DIAGNOSIS — R509 Fever, unspecified: Secondary | ICD-10-CM

## 2013-06-13 DIAGNOSIS — R319 Hematuria, unspecified: Secondary | ICD-10-CM

## 2013-06-13 DIAGNOSIS — I2699 Other pulmonary embolism without acute cor pulmonale: Secondary | ICD-10-CM

## 2013-06-13 LAB — CBC
MCHC: 31.5 g/dL (ref 30.0–36.0)
MCV: 88.8 fL (ref 78.0–100.0)
Platelets: 158 10*3/uL (ref 150–400)
RBC: 2.86 MIL/uL — ABNORMAL LOW (ref 4.22–5.81)
WBC: 3.6 10*3/uL — ABNORMAL LOW (ref 4.0–10.5)

## 2013-06-13 LAB — BASIC METABOLIC PANEL
CO2: 26 mEq/L (ref 19–32)
Chloride: 108 mEq/L (ref 96–112)
Creatinine, Ser: 1.1 mg/dL (ref 0.50–1.35)
GFR calc non Af Amer: 68 mL/min — ABNORMAL LOW (ref >90)
Potassium: 3.7 mEq/L (ref 3.5–5.1)
Sodium: 140 mEq/L (ref 135–145)

## 2013-06-13 MED ORDER — CEFAZOLIN SODIUM-DEXTROSE 2-3 GM-% IV SOLR
2.0000 g | Freq: Three times a day (TID) | INTRAVENOUS | Status: DC
  Administered 2013-06-13 – 2013-06-17 (×12): 2 g via INTRAVENOUS
  Filled 2013-06-13 (×16): qty 50

## 2013-06-13 NOTE — Progress Notes (Signed)
ANTIBIOTIC CONSULT NOTE - FOLLOW UP  Pharmacy Consult for Vancomycin and Cefazolin Indication: Staph bacteremia, UTI, cellulitis around suprapubic catheter  Patient Measurements: Height: 6' (182.9 cm) Weight: 263 lb 10.7 oz (119.6 kg) IBW/kg (Calculated) : 77.6  Vital Signs: Temp: 99 F (37.2 C) (10/31 1300) Temp src: Oral (10/31 0752) BP: 122/75 mmHg (10/31 1300) Pulse Rate: 77 (10/31 1300) Intake/Output from previous day: 10/30 0701 - 10/31 0700 In: 490 [P.O.:240; IV Piggyback:250] Out: 2450 [Urine:2450]  Labs:  Recent Labs  06/11/13 1829 06/12/13 0449 06/13/13 0520  WBC 5.7 4.6 3.6*  HGB 9.3* 8.0* 8.0*  PLT 201 165 158  CREATININE 1.14 1.14 1.10   Estimated Creatinine Clearance: 87 ml/min (by C-G formula based on Cr of 1.1).  Microbiology: Recent Results (from the past 720 hour(s))  CLOSTRIDIUM DIFFICILE BY PCR     Status: Abnormal   Collection Time    05/16/13  2:34 AM      Result Value Range Status   C difficile by pcr POSITIVE (*) NEGATIVE Final   Comment: CRITICAL RESULT CALLED TO, READ BACK BY AND VERIFIED WITH:     Pennelope Bracken 05/16/13 1120 BY K SCHULTZ  MRSA PCR SCREENING     Status: None   Collection Time    05/16/13  3:26 PM      Result Value Range Status   MRSA by PCR NEGATIVE  NEGATIVE Final   Comment:            The GeneXpert MRSA Assay (FDA     approved for NASAL specimens     only), is one component of a     comprehensive MRSA colonization     surveillance program. It is not     intended to diagnose MRSA     infection nor to guide or     monitor treatment for     MRSA infections.  URINE CULTURE     Status: None   Collection Time    05/27/13  2:22 PM      Result Value Range Status   Specimen Description URINE, CLEAN CATCH   Final   Special Requests NONE   Final   Culture  Setup Time     Final   Value: 05/27/2013 15:49     Performed at [facility] Count     Final   Value: NO GROWTH     Performed at [facility]   Culture     Final   Value: NO GROWTH     Performed at [facility]   Report Status 05/28/2013 FINAL   Final  CULTURE, BLOOD (ROUTINE X 2)     Status: None   Collection Time    06/11/13  6:30 PM      Result Value Range Status   Specimen Description BLOOD ARM LEFT   Final   Special Requests BOTTLES DRAWN AEROBIC AND ANAEROBIC [facility]   Final   Culture  Setup Time     Final   Value: 06/12/2013 02:38     Performed at [facility]   Culture     Final   Value: GRAM POSITIVE COCCI IN CLUSTERS     Note: Gram Stain Report Called to,Read Back By and Verified With: ARIEL MOHAMMED ON 06/13/2013 AT 12:48A BY Serafina Mitchell     Performed at [facility]   Report Status PENDING   Incomplete  URINE CULTURE     Status: None   Collection Time  06/11/13  7:27 PM      Result Value Range Status   Specimen Description URINE, RANDOM   Final   Special Requests NONE   Final   Culture  Setup Time     Final   Value: 06/12/2013 03:06     Performed at [facility] Count     Final   Value: >=100,000 COLONIES/ML     Performed at [facility]   Culture     Final   Value: STAPHYLOCOCCUS SPECIES     Note: RIFAMPIN AND GENTAMICIN SHOULD NOT BE USED AS SINGLE DRUGS FOR TREATMENT OF STAPH INFECTIONS.     Performed at [facility]   Report Status PENDING   Incomplete   Assessment:  Day # 3 Vancomycin and Zosyn.  Zosyn to change to Cefazolin.  Staph species in urine culture, gram + cocci in clusters in blood culture. For TEE.  BUN/creatinine stable.  Suprapubic catheter, hematuria noted. Recent renal hematoma. For suprapubic removal when fever resolves and urine clears.  Recent fasciotomies, noted mild erythema and crusting noted.  On full-dose Xarelto for recent DVT/PE.  Infed 1500 mg IV given 10/30. Now on daily Niferex.  Goal of Therapy:  Vancomycin trough level 15-20 mcg/ml appropriate Cefazolin dose for renal function and infection  Plan:    Continue Vancomycin 1250 mg IV q12hrs.  Begin Cefazolin 2 grams IV q8hrs.  Will follow up culture date, TEE, renal function and clinical progress.  Will plan to check Vancomycin level if Vanc to continue once cultures are final.  Dennie Fetters, RPh Pager: [phone] 06/13/2013,3:08 PM

## 2013-06-13 NOTE — Progress Notes (Signed)
CRITICAL VALUE ALERT  Critical value received:  Positive blood cultures in anaerobic bottle with gram positive cocci in clusters.   Date of notification:  12:52 AM  Time of notification:  06/13/2013  Critical value read back:yes  Nurse who received alert:  Cecile Hearing  MD notified (1st page):  [place], NP  Time of first page:  12:52 AM  MD notified (2nd page):  Time of second page:  Responding MD:    Time MD responded:

## 2013-06-13 NOTE — H&P (Signed)
Jack [name] is an 67 y.o. male.  Plan: Continue vancomycin Order TTE Repeat blood culture  Assessment: Staph aureus bacteremia, source: infected urinary catheter   HPI  67 year old male, presents with high grade fever, chills and sweats over the past couple of days. His fever went up to 102-103 at the time of admission. He says that his suprapubic catheter has been infected for the past week; crusting and drainage is present but he has not noted any erythema, swelling or tenderness in the area. His past medical history is significant for renal failure, TURP,  DVTs most recently 1 month ago for which a fasciotomy was done (10/08), and pulmonary embolism. There is mild erythema and crusting over the fasciotomy stitches. He has no additional systemic complaints such as abdominal discomfort, shortness of breath or chest pain. He has been put on vancomycin and zosyn.  Allergies: blue dye PMH: Hospital Chronic renal insufficiency DVT, bilateral lower limbs Cellulitis Anemia SIRS (systemic inflammatory response syndrome) UTI (urinary tract infection) Non-Hospital Acute renal failure Acute respiratory failure with hypoxia Hematuria Renal hematoma Acute blood loss anemia Pain Sepsis Hyperkalemia GI bleed Pulmonary embolism 9/2 Social history: drinks alcohol Family history: mother--DVT, Sister--Heart disease  Review of Systems  Constitutional: Positive for fever and chills.  HENT: Negative.   Eyes: Negative.   Respiratory: Negative.   Cardiovascular: Negative.   Gastrointestinal: Negative.   Genitourinary: Negative.   Musculoskeletal: Negative.   Skin: Negative.   Neurological: Positive for weakness.  Endo/Heme/Allergies: Negative.   Psychiatric/Behavioral: Negative.   all other exams unremarkable  Blood pressure 143/73, pulse 80, temperature 98.7 F (37.1 C), temperature source Oral, resp. rate 18, height 6' (1.829 m), weight 119.6 kg (263 lb 10.7 oz), SpO2 98.00%. Physical Exam   Constitutional: He is oriented to person, place, and time.  Cardiovascular: Normal rate, regular rhythm and normal heart sounds.   No murmur heard. Respiratory: Effort normal and breath sounds normal. No respiratory distress. He has no wheezes. He has no rales.  GI: Soft. Bowel sounds are normal.  Neurological: He is alert and oriented to person, place, and time. He has normal reflexes.     Selected Labs (Up to last 3 results from past 72 hours) Report       10/29 1829   10/30 0449   10/31 0520      WBC 5.7  4.6  3.6      RBC 3.35   2.89   2.86      Hemoglobin 9.3   8.0   8.0      HCT 29.8   25.8   25.4      Platelets 201  165  158     Sodium 138  139  140     Potassium 4.0  3.8  3.7     Chloride 104  106  108     CO2 22  25  26      BUN 11  11  10      Creatinine 1.14  1.14  1.10     Calcium 8.3   8.1   8.4     Glucose 154   117   111     EXAM:  CHEST 2 VIEW  COMPARISON: 05/13/2013.  FINDINGS:  The heart size and mediastinal contours are within normal limits.  Both lungs are clear. The visualized skeletal structures are  unremarkable. Old right-sided rib fractures.  IMPRESSION:  No active cardiopulmonary disease.  Electronically Signed  By: Andreas Newport  M.D.  On: 06/11/2013 19:57   Blood culture (routine x 2) [[id number]] Collected: 06/11/13 1830 Updated: 06/13/13 0556 Specimen Type: Blood Specimen Description BLOOD ARM LEFT Special Requests BOTTLES DRAWN AEROBIC AND ANAEROBIC 6CC Culture Setup Time - Result: 06/12/2013 02:38 Performed at [facility] Culture - Result: GRAM POSITIVE COCCI IN CLUSTERS Note: Gram Stain Report Called to,Read Back By and Verified With: ARIEL MOHAMMED ON 06/13/2013 AT 12:48A BY Serafina Mitchell Performed at [facility] Report Status PENDING  Urine culture [[id number]] Collected: 06/11/13 1927 Updated: 06/13/13 0032 Specimen Type: Urine Specimen Description URINE, RANDOM Special Requests NONE Culture Setup Time - Result: 06/12/2013  03:06 Performed at [facility] Count - Result: >=100,000 COLONIES/ML Performed at [facility] Culture - Result: STAPHYLOCOCCUS SPECIES Note: RIFAMPIN AND GENTAMICIN SHOULD NOT BE USED AS SINGLE DRUGS FOR TREATMENT OF STAPH INFECTIONS. Performed at [facility], [place] 06/13/2013, 11:29 AM

## 2013-06-13 NOTE — Progress Notes (Signed)
[facility] [place]  Subjective  - no new complaints with his legs and the incisions.  He states he can see his knee caps for the first time in a long time because his swelling has gone down.   Objective 143/73 80 98.7 F (37.1 C) (Oral) 18 98%  Intake/Output Summary (Last 24 hours) at 06/13/13 1157 Last data filed at 06/13/13 0900  Gross per 24 hour  Intake    240 ml  Output   2251 ml  Net  -2011 ml    Incision clean and dry.   Small areas of scabbing.  Palpable DP pulses 3+, min. edema  Assessment/Planning: 1)S/P fasciotomies bilateral LE Will re-access this weekend and decide when the rest of the staples should come out.  2)fever and cellulitis of suprapubic catheter     Thomasena Edis [facility] 06/13/2013 11:57 AM --  Laboratory Lab Results:  Recent Labs  06/12/13 0449 06/13/13 0520  WBC 4.6 3.6*  HGB 8.0* 8.0*  HCT 25.8* 25.4*  PLT 165 158   BMET  Recent Labs  06/12/13 0449 06/13/13 0520  NA 139 140  K 3.8 3.7  CL 106 108  CO2 25 26  GLUCOSE 117* 111*  BUN 11 10  CREATININE 1.14 1.10  CALCIUM 8.1* 8.4    COAG Lab Results  Component Value Date   INR 1.37 05/17/2013   INR 1.21 05/14/2013   INR 1.13 05/11/2013   No results found for this basename: PTT

## 2013-06-13 NOTE — H&P (Addendum)
[facility]     Reason for Consult: bacteremia    Referring Physician: Dr. Arthor Captain  Principal Problem:   UTI (urinary tract infection) Active Problems:   Chronic renal insufficiency   DVT, bilateral lower limbs   Cellulitis   Anemia   SIRS (systemic inflammatory response syndrome)   . docusate sodium  100 mg Oral BID  . iron polysaccharides  150 mg Oral Daily  . multivitamin with minerals  1 tablet Oral Daily  . pantoprazole  40 mg Oral Q1200  . piperacillin-tazobactam (ZOSYN)  IV  3.375 g Intravenous Q8H  . Rivaroxaban  15 mg Oral BID WC  . [START ON 06/18/2013] rivaroxaban  20 mg Oral Q supper  . sodium chloride  3 mL Intravenous Q12H  . vancomycin  1,250 mg Intravenous Q12H    Recommendations: Vancomycin and cefazolin  Removal of catheter as soon as is possible  Assessment: Likely to be Staph aureus though no definitive culture yet.     Antibiotics: Vancomycin and zosyn  HPI: Jack [name] is a 67 y.o. male, presented with high grade fever, chills and sweats over the past couple of days. His fever went up to 102-103 at the time of admission. He says that his suprapubic catheter has been infected for the past week; crusting and drainage is present but he has not noted any erythema, swelling or tenderness in the area. His past medical history is significant for renal failure, TURP, DVTs most recently 1 month ago for which a fasciotomy was done (10/08), and pulmonary embolism. There is mild erythema and crusting over the fasciotomy stitches. He has no additional systemic complaints such as abdominal discomfort, shortness of breath or chest pain. He has been put on vancomycin and zosyn.  No back pain.      Review of Systems: A comprehensive review of systems was negative.  Past Medical History  Diagnosis Date  . HTN (hypertension)   . Ureteral stone   . Colon cancer   . Shortness of breath   . Asthma   . Chronic kidney disease     acuts  on chronic renal failure  . Arthritis   . PE (pulmonary embolism) 04/2013  . DVT, bilateral lower limbs   . Compartment syndrome, traumatic, lower extremity     History  Substance Use Topics  . Smoking status: Never Smoker   . Smokeless tobacco: Current User    Types: Chew  . Alcohol Use: Yes     Comment: OCCASSIONAL    Family History  Problem Relation Age of Onset  . Urolithiasis    . Deep vein thrombosis Mother   . Heart disease Sister    Allergies  Allergen Reactions  . Contrast Media [Iodinated Diagnostic Agents]     On 04/29/2013 spoke with patient he had some type of procedure at [facility] and broke out in hives after the exams in 1997, he did not know what of type of study he had.  I called [facility] Radiology department the only type of contrast they used during that time  was [place]    OBJECTIVE: Blood pressure 143/73, pulse 80, temperature 98.7 F (37.1 C), temperature source Oral, resp. rate 18, height 6' (1.829 m), weight 263 lb 10.7 oz (119.6 kg), SpO2 98.00%. General: Awake, alert, nad Skin: no rashes Lungs: CTA B Cor: rrr Abdomen: soft, nt, nd Ext: bilateral incisions with staples, mild surrounding erythema, dried lesions, no drainage  Microbiology: Recent Results (from  the past 240 hour(s))  CULTURE, BLOOD (ROUTINE X 2)     Status: None   Collection Time    06/11/13  6:30 PM      Result Value Range Status   Specimen Description BLOOD ARM LEFT   Final   Special Requests BOTTLES DRAWN AEROBIC AND ANAEROBIC [facility]   Final   Culture  Setup Time     Final   Value: 06/12/2013 02:38     Performed at [facility]   Culture     Final   Value: GRAM POSITIVE COCCI IN CLUSTERS     Note: Gram Stain Report Called to,Read Back By and Verified With: ARIEL MOHAMMED ON 06/13/2013 AT 12:48A BY WILEJ     Performed at [facility]   Report Status PENDING   Incomplete  URINE CULTURE     Status: None   Collection Time    06/11/13  7:27 PM       Result Value Range Status   Specimen Description URINE, RANDOM   Final   Special Requests NONE   Final   Culture  Setup Time     Final   Value: 06/12/2013 03:06     Performed at [facility] Count     Final   Value: >=100,000 COLONIES/ML     Performed at [facility]   Culture     Final   Value: STAPHYLOCOCCUS SPECIES     Note: RIFAMPIN AND GENTAMICIN SHOULD NOT BE USED AS SINGLE DRUGS FOR TREATMENT OF STAPH INFECTIONS.     Performed at [facility]   Report Status PENDING   Incomplete    Casady Voshell, Molly Maduro, MD [facility] [email or web address] [id number] pager  [phone] cell 06/13/2013, 12:40 PM

## 2013-06-13 NOTE — Progress Notes (Signed)
[facility] PROGRESS NOTE  Jack [name] ZOX:[id number] DOB: [birth year] DOA: 06/11/2013 PCP: Simone Curia, MD  67 yo male just released from SNF after CIR stay.  Has recent history of Turp s/p suprapubic catheter, PE, bilateral LE DVTs which developed compartment syndrome and required bilateral fasciotomies.   Assessment/Plan:  SIRS  -Resolved. -Likely secondary to UTI, bacteremia -Denies chills today -Temp decreased to 98.7 this AM with tylenol. Was 102.1 yesterday evening. -Receiving vanc and zosyn per pharmacy. ID consult today.  UTI with suprapubic catheter -Catheter changed just prior to admission by Urology -Still has hematuria -Urine cultures revealed staph. -on Zosyn and vanc  Cellulitis around suprapubic catheter -Improving since yesterday but still tender. -Urology consulted and suggested possible removal of catheter after fever/UTI resolves. -Consider repeat CT to reassess renal hematoma and wound if fever does not resolve per Urology. -On vanc and zosyn.  Bacteremia -blood cultures reveal growth of anaerobes and gram + cocci. -Consult Infectious disease 10/31 -on vanc and zosyn.   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 Imogene Burn consulted and recommendations made -Wound care consultation completed -Every other staple and sutures removed yesterday. -Areas of eschar noted surrounding incisions. Monitor closely for changes to include cellulitis, discharge.  -Vascular will follow for further removal of staples over next 2 days.  Chronic renal insufficiency - Creatinine at baseline.  Anemia (normocytic) -appears at baseline -no active bleeding. -Iron deficient.  (Ferritin elevated) -IV Iron given 10/30 -Oral Iron supplementation started.  Leukopenia - WBC down to 3.6 from 4.6 yesterday.  - Likely due to acute bacteremia/UTI - Will follow for changes.  DVT Prophylaxis:  xeralto  Code  Status: full code Family Communication: Disposition Plan: inpatient   Consultants:  Vascular and Vein - Dr. Imogene Burn  Urology - Dr. Annabell Howells  Wound Care - Gwenith Spitz, RN   Procedures:  none  Antibiotics: Vanc and Zosyn started at admission . HPI/Subjective: Good spirits. Is able to get up to chair beside bed this AM. Denies fever or chills. Has pain around suprapubic catheter. States when he urinated (through urethra) yesterday he felt pain in his right side.  Objective: Filed Vitals:   06/12/13 2119 06/13/13 0204 06/13/13 0401 06/13/13 0752  BP: 121/68 126/72 126/68 143/73  Pulse: 85 91 85 80  Temp: 98.2 F (36.8 C) 99.5 F (37.5 C) 99.2 F (37.3 C) 98.7 F (37.1 C)  TempSrc: Oral Oral Oral Oral  Resp: 18 18 18 18   Height:      Weight:      SpO2: 97% 96% 97% 98%    Intake/Output Summary (Last 24 hours) at 06/13/13 0841 Last data filed at 06/13/13 8119  Gross per 24 hour  Intake    240 ml  Output   2450 ml  Net  -2210 ml   Filed Weights   06/11/13 1654 06/11/13 2351  Weight: 293 lb (132.904 kg) 263 lb 10.7 oz (119.6 kg)    Exam:   General:  Wd, male, pleasant, A&O, NAD, with rigors  Cardiovascular: RRR, no murmurs, rubs or gallops,  Respiratory: CTA, no wheeze, crackles, or rales.  No increased work of breathing.  Abdomen: Soft, non-tender, non-distended, + bowel sounds, no masses.  No CVA tendderness. Suprapubic cath in place.  No evident discharge at this time.  Musculoskeletal: Able to move all 4 extremities, 5/5 strength in each  Skin: Lower extremities each have two vertical incisions on medial and lateral aspects of anterior calf closed with staples.  There is erythema surrounding both incisions extending out 1 cm. There are two areas of eschar. No oozing/weaping noted. No purulent drainage.  Data Reviewed: Basic Metabolic Panel:  Recent Labs Lab 06/11/13 1829 06/12/13 0449 06/13/13 0520  NA 138 139 140  K 4.0 3.8 3.7  CL 104 106 108  CO2  22 25 26   GLUCOSE 154* 117* 111*  BUN 11 11 10   CREATININE 1.14 1.14 1.10  CALCIUM 8.3* 8.1* 8.4  MG  --  1.7  --   PHOS  --  3.8  --    Liver Function Tests:  Recent Labs Lab 06/11/13 1829 06/12/13 0449  AST 15 10  ALT 8 6  ALKPHOS 64 52  BILITOT 0.5 0.5  PROT 6.6 5.6*  ALBUMIN 2.6* 2.2*   CBC:  Recent Labs Lab 06/11/13 1829 06/12/13 0449 06/13/13 0520  WBC 5.7 4.6 3.6*  NEUTROABS 4.0  --   --   HGB 9.3* 8.0* 8.0*  HCT 29.8* 25.8* 25.4*  MCV 89.0 89.3 88.8  PLT 201 165 158   BNP (last 3 results)  Recent Labs  04/18/13 1245 04/27/13 1240  PROBNP 1762.0* 1260.0*     Studies: Dg Chest 2 View  06/11/2013   CLINICAL DATA:  Fever. Sepsis.  EXAM: CHEST  2 VIEW  COMPARISON:  05/13/2013.  FINDINGS: The heart size and mediastinal contours are within normal limits. Both lungs are clear. The visualized skeletal structures are unremarkable. Old right-sided rib fractures.  IMPRESSION: No active cardiopulmonary disease.   Electronically Signed   By: Andreas Newport M.D.   On: 06/11/2013 19:57    Scheduled Meds: . docusate sodium  100 mg Oral BID  . iron polysaccharides  150 mg Oral Daily  . multivitamin with minerals  1 tablet Oral Daily  . pantoprazole  40 mg Oral Q1200  . piperacillin-tazobactam (ZOSYN)  IV  3.375 g Intravenous Q8H  . Rivaroxaban  15 mg Oral BID WC  . [START ON 06/18/2013] rivaroxaban  20 mg Oral Q supper  . sodium chloride  3 mL Intravenous Q12H  . vancomycin  1,250 mg Intravenous Q12H   Continuous Infusions:   Principal Problem:   UTI (urinary tract infection) Active Problems:   Chronic renal insufficiency   DVT, bilateral lower limbs   Cellulitis   Anemia   SIRS (systemic inflammatory response syndrome)    Georgina Quint, PA-S  Algis Downs, PA-C [facility] Pager [phone]. If 7PM-7AM, please contact night-coverage at [email or web address], password [facility] 06/13/2013, 8:41 AM  LOS: 2 days      Addendum  Patient seen and  examined, chart and data base reviewed.  I agree with the above assessment and plan.  For full details please see Mr. Georgina Quint PA-S note, reviewed by Mrs. Algis Downs PA.  I reviewed the above noted and I addended it as needed.   Clint Lipps, MD [facility] Pager: [phone] 06/13/2013, 2:21 PM

## 2013-06-14 LAB — URINE MICROSCOPIC-ADD ON

## 2013-06-14 LAB — URINALYSIS, ROUTINE W REFLEX MICROSCOPIC
Bilirubin Urine: NEGATIVE
Glucose, UA: NEGATIVE mg/dL
Ketones, ur: NEGATIVE mg/dL
Nitrite: NEGATIVE
Urobilinogen, UA: 0.2 mg/dL (ref 0.0–1.0)
pH: 7 (ref 5.0–8.0)

## 2013-06-14 LAB — BASIC METABOLIC PANEL
BUN: 8 mg/dL (ref 6–23)
CO2: 24 mEq/L (ref 19–32)
Creatinine, Ser: 0.99 mg/dL (ref 0.50–1.35)
GFR calc Af Amer: >90 mL/min (ref >90)
GFR calc non Af Amer: 83 mL/min — ABNORMAL LOW (ref >90)
Potassium: 3.7 mEq/L (ref 3.5–5.1)

## 2013-06-14 LAB — CBC
HCT: 26.2 % — ABNORMAL LOW (ref 39.0–52.0)
MCHC: 31.7 g/dL (ref 30.0–36.0)
MCV: 87.9 fL (ref 78.0–100.0)
RDW: 15.9 % — ABNORMAL HIGH (ref 11.5–15.5)

## 2013-06-14 MED ORDER — POTASSIUM CHLORIDE CRYS ER 20 MEQ PO TBCR
40.0000 meq | EXTENDED_RELEASE_TABLET | Freq: Once | ORAL | Status: AC
  Administered 2013-06-14: 11:00:00 40 meq via ORAL
  Filled 2013-06-14: qty 2

## 2013-06-14 MED ORDER — FUROSEMIDE 10 MG/ML IJ SOLN
40.0000 mg | Freq: Once | INTRAMUSCULAR | Status: AC
  Administered 2013-06-14: 11:00:00 40 mg via INTRAVENOUS
  Filled 2013-06-14: qty 4

## 2013-06-14 NOTE — Progress Notes (Addendum)
Pt states he is feeling better.  No fever in 12 hours.  Pt's legs continue to improve.  Continue to keep legs elevated for edema.    Will discontinue the rest of his staples today.  ABx for staph bacteremia per primary team.  Legs are not source of infection.  Doreatha Massed 06/14/2013 8:38 AM Agree with above Leg incisions continued to improve and edema continues to decrease Continue to recommend elevation of lower extremities while in bed

## 2013-06-14 NOTE — Progress Notes (Signed)
Pt's suprapubic catheter was plugged at 1130 per MD order. Site began leaking urine. Suprapubic catheter was unplugged and attached to a foley bag to drain. 100cc clear yellow urine flowed into bag. MD notified.

## 2013-06-14 NOTE — Progress Notes (Signed)
Patient ID: Jack [name], male   DOB: 02-28-1946, 67 y.o.   MRN: [id number]    Subjective: [name] is doing well with resolution of this fever and reduced suprapubic pain.   The urine is clearing.  ROS: Negative except as above  Objective: Vital signs in last 24 hours: Temp:  [98.2 F (36.8 C)-99 F (37.2 C)] 98.5 F (36.9 C) (11/01 4098) Pulse Rate:  [77-86] 81 (11/01 0608) Resp:  [17-18] 17 (11/01 0608) BP: (111-138)/(64-75) 138/65 mmHg (11/01 0608) SpO2:  [97 %-99 %] 97 % (11/01 0608)  Intake/Output from previous day: 10/31 0701 - 11/01 0700 In: 300 [IV Piggyback:300] Out: 1502 [Urine:1500; Stool:2] Intake/Output this shift:    General appearance: alert and no distress GI: his suprapubic wound has much less erythema and tenderness and remains intact.   Lab Results:   Recent Labs  06/13/13 0520 06/14/13 0610  WBC 3.6* 4.1  HGB 8.0* 8.3*  HCT 25.4* 26.2*  PLT 158 195   BMET  Recent Labs  06/13/13 0520 06/14/13 0610  NA 140 139  K 3.7 3.7  CL 108 107  CO2 26 24  GLUCOSE 111* 106*  BUN 10 8  CREATININE 1.10 0.99  CALCIUM 8.4 8.4   PT/INR No results found for this basename: LABPROT, INR,  in the last 72 hours ABG No results found for this basename: PHART, PCO2, PO2, HCO3,  in the last 72 hours  Studies/Results: No results found.  Anti-infectives: Anti-infectives   Start     Dose/Rate Route Frequency Ordered Stop   06/13/13 1600  ceFAZolin (ANCEF) IVPB 2 g/50 mL premix     2 g 100 mL/hr over 30 Minutes Intravenous 3 times per day 06/13/13 1507     06/12/13 0800  vancomycin (VANCOCIN) 1,250 mg in sodium chloride 0.9 % 250 mL IVPB     1,250 mg 166.7 mL/hr over 90 Minutes Intravenous Every 12 hours 06/11/13 1947     06/12/13 0200  piperacillin-tazobactam (ZOSYN) IVPB 3.375 g  Status:  Discontinued     3.375 g 12.5 mL/hr over 240 Minutes Intravenous Every 8 hours 06/11/13 1947 06/11/13 2341   06/12/13 0100  piperacillin-tazobactam (ZOSYN) IVPB 3.375  g  Status:  Discontinued     3.375 g 12.5 mL/hr over 240 Minutes Intravenous 3 times per day 06/11/13 2341 06/13/13 1446   06/11/13 2015  vancomycin (VANCOCIN) 2,500 mg in sodium chloride 0.9 % 500 mL IVPB     2,500 mg 250 mL/hr over 120 Minutes Intravenous  Once 06/11/13 2001 06/11/13 2232   06/11/13 2000  vancomycin (VANCOCIN) 2,500 mg in sodium chloride 0.9 % 500 mL IVPB  Status:  Discontinued     2,500 mg 250 mL/hr over 120 Minutes Intravenous  Once 06/11/13 1947 06/11/13 1951   06/11/13 2000  vancomycin (VANCOCIN) 1,500 mg in sodium chloride 0.9 % 500 mL IVPB  Status:  Discontinued     1,500 mg 250 mL/hr over 120 Minutes Intravenous  Once 06/11/13 1951 06/11/13 2001   06/11/13 1915  piperacillin-tazobactam (ZOSYN) IVPB 3.375 g     3.375 g 100 mL/hr over 30 Minutes Intravenous  Once 06/11/13 1901 06/11/13 2030   06/11/13 1915  vancomycin (VANCOCIN) IVPB 1000 mg/200 mL premix  Status:  Discontinued     1,000 mg 200 mL/hr over 60 Minutes Intravenous  Once 06/11/13 1901 06/11/13 1944      Current Facility-Administered Medications  Medication Dose Route Frequency Provider Last Rate Last Dose  . acetaminophen (TYLENOL) tablet  650 mg  650 mg Oral Q6H PRN Therisa Doyne, MD   650 mg at 06/12/13 1736   Or  . acetaminophen (TYLENOL) suppository 650 mg  650 mg Rectal Q6H PRN Therisa Doyne, MD      . albuterol (PROVENTIL) (5 MG/ML) 0.5% nebulizer solution 2.5 mg  2.5 mg Nebulization Q2H PRN Therisa Doyne, MD      . ALPRAZolam Prudy Feeler) tablet 0.25 mg  0.25 mg Oral TID PRN Therisa Doyne, MD      . ceFAZolin (ANCEF) IVPB 2 g/50 mL premix  2 g Intravenous Q8H Dennie Fetters, RPH   2 g at 06/14/13 0509  . docusate sodium (COLACE) capsule 100 mg  100 mg Oral BID Therisa Doyne, MD   100 mg at 06/14/13 1059  . iron polysaccharides (NIFEREX) capsule 150 mg  150 mg Oral Daily Stephani Police, PA-C   150 mg at 06/14/13 1059  . morphine 2 MG/ML injection 2 mg  2 mg  Intravenous Q4H PRN Therisa Doyne, MD      . multivitamin with minerals tablet 1 tablet  1 tablet Oral Daily Haynes Bast, RD   1 tablet at 06/14/13 1059  . ondansetron (ZOFRAN) tablet 4 mg  4 mg Oral Q6H PRN Therisa Doyne, MD       Or  . ondansetron (ZOFRAN) injection 4 mg  4 mg Intravenous Q6H PRN Therisa Doyne, MD      . oxyCODONE (Oxy IR/ROXICODONE) immediate release tablet 5 mg  5 mg Oral Q6H PRN Therisa Doyne, MD      . pantoprazole (PROTONIX) EC tablet 40 mg  40 mg Oral Q1200 Therisa Doyne, MD   40 mg at 06/13/13 1236  . Rivaroxaban (XARELTO) tablet 15 mg  15 mg Oral BID WC Therisa Doyne, MD   15 mg at 06/14/13 0743  . [START ON 06/18/2013] Rivaroxaban (XARELTO) tablet 20 mg  20 mg Oral Q supper Clydia Llano, MD      . sodium chloride 0.9 % injection 3 mL  3 mL Intravenous Q12H Therisa Doyne, MD   3 mL at 06/13/13 2104  . vancomycin (VANCOCIN) 1,250 mg in sodium chloride 0.9 % 250 mL IVPB  1,250 mg Intravenous Q12H Anh P Pham, RPH   1,250 mg at 06/14/13 8295    Assessment: He is responding well to therapy  Plan: I am going to have the SP tube plugged for a voiding trial and if he is successful, I will consider removing the tube.   He will need to have the stents removed soon as well.     LOS: 3 days    Crystina Borrayo J 06/14/2013

## 2013-06-14 NOTE — Progress Notes (Signed)
Clamped suprapubic catheter with yellow plug. Instructed patient to let us know when he voids again to do post void residual. Also instructed patient to let us know if he can not void or feel full so we can reassess. Madelin Rear, MSN, RN, [employer]

## 2013-06-14 NOTE — Progress Notes (Signed)
[facility] PROGRESS NOTE  Jack [name] YNW:[id number] DOB: [birth year] DOA: 06/11/2013 PCP: Simone Curia, MD  Subjective: Denies fever, chills or any specific symptoms. Good appetite, overall feels much better.  67 yo male just released from SNF after [facility] stay.  Has recent history of Turp s/p suprapubic catheter, PE, bilateral LE DVTs which developed compartment syndrome and required bilateral fasciotomies.   Assessment/Plan:  Sepsis  -Resolved. -Likely secondary to UTI, bacteremia -Denies chills today -Temp decreased to 98.7 this AM with tylenol. Was 102.1 yesterday evening.  UTI with suprapubic catheter -Catheter changed just prior to admission by Urology -Still has hematuria, prophylactic heparin discontinued. -Urine cultures revealed staph. -On vancomycin and cefazolin.  Cellulitis around suprapubic catheter -Improving since yesterday but still tender. -Urology consulted and suggested possible removal of catheter after fever/UTI resolves. -Consider repeat CT to reassess renal hematoma and wound if fever does not resolve per Urology.  Staph aureus bacteremia -Blood culture showed staph aureus, infectious disease consulted on 10/31 -Antibiotics adjusted to vancomycin and cefazolin. -Further workup at the discretion of infectious disease.  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 Imogene Burn consulted and recommendations made -Wound care consultation completed -Every other staple and sutures removed yesterday. -Areas of eschar noted surrounding incisions. Monitor closely for changes to include cellulitis, discharge.  -Vascular will follow for further removal of staples over next 2 days.  Chronic renal insufficiency - Creatinine at baseline.  Anemia (normocytic) -appears at baseline -no active bleeding. -Iron deficient.  (Ferritin elevated) -IV Iron given 10/30 -Oral Iron supplementation  started.  Leukopenia - WBC down to 3.6 from 4.6 yesterday.  - Likely due to acute bacteremia/UTI - Will follow for changes.  DVT Prophylaxis:  xeralto  Code Status: full code Family Communication: Disposition Plan: inpatient   Consultants:  Vascular and Vein - Dr. Imogene Burn  Urology - Dr. Annabell Howells  Wound Care - Gwenith Spitz, RN   Procedures:  none  Antibiotics: Vanc and Zosyn started at admission   Objective: Filed Vitals:   06/13/13 1300 06/13/13 2049 06/14/13 0051 06/14/13 0608  BP: 122/75 111/64 126/67 138/65  Pulse: 77 86 81 81  Temp: 99 F (37.2 C) 98.3 F (36.8 C) 98.2 F (36.8 C) 98.5 F (36.9 C)  TempSrc:  Oral Oral Oral  Resp: 18 18 18 17   Height:      Weight:      SpO2: 99% 97% 97% 97%    Intake/Output Summary (Last 24 hours) at 06/14/13 1033 Last data filed at 06/13/13 2011  Gross per 24 hour  Intake     50 ml  Output    701 ml  Net   -651 ml   Filed Weights   06/11/13 1654 06/11/13 2351  Weight: 132.904 kg (293 lb) 119.6 kg (263 lb 10.7 oz)    Exam:   General:  Wd, male, pleasant, A&O, NAD, with rigors  Cardiovascular: RRR, no murmurs, rubs or gallops,  Respiratory: CTA, no wheeze, crackles, or rales.  No increased work of breathing.  Abdomen: Soft, non-tender, non-distended, + bowel sounds, no masses.  No CVA tendderness. Suprapubic cath in place.  No evident discharge at this time.  Musculoskeletal: Able to move all 4 extremities, 5/5 strength in each  Skin: Lower extremities each have two vertical incisions on medial and lateral aspects of anterior calf closed with staples. There is erythema surrounding both incisions extending out 1 cm. There are two areas of eschar. No oozing/weaping noted. No purulent drainage.  Data Reviewed: Basic Metabolic Panel:  Recent Labs Lab 06/11/13 1829 06/12/13 0449 06/13/13 0520 06/14/13 0610  NA 138 139 140 139  K 4.0 3.8 3.7 3.7  CL 104 106 108 107  CO2 22 25 26 24   GLUCOSE 154* 117* 111*  106*  BUN 11 11 10 8   CREATININE 1.14 1.14 1.10 0.99  CALCIUM 8.3* 8.1* 8.4 8.4  MG  --  1.7  --   --   PHOS  --  3.8  --   --    Liver Function Tests:  Recent Labs Lab 06/11/13 1829 06/12/13 0449  AST 15 10  ALT 8 6  ALKPHOS 64 52  BILITOT 0.5 0.5  PROT 6.6 5.6*  ALBUMIN 2.6* 2.2*   CBC:  Recent Labs Lab 06/11/13 1829 06/12/13 0449 06/13/13 0520 06/14/13 0610  WBC 5.7 4.6 3.6* 4.1  NEUTROABS 4.0  --   --   --   HGB 9.3* 8.0* 8.0* 8.3*  HCT 29.8* 25.8* 25.4* 26.2*  MCV 89.0 89.3 88.8 87.9  PLT 201 165 158 195   BNP (last 3 results)  Recent Labs  04/18/13 1245 04/27/13 1240  PROBNP 1762.0* 1260.0*     Studies: No results found.  Scheduled Meds: .  ceFAZolin (ANCEF) IV  2 g Intravenous Q8H  . docusate sodium  100 mg Oral BID  . iron polysaccharides  150 mg Oral Daily  . multivitamin with minerals  1 tablet Oral Daily  . pantoprazole  40 mg Oral Q1200  . Rivaroxaban  15 mg Oral BID WC  . [START ON 06/18/2013] rivaroxaban  20 mg Oral Q supper  . sodium chloride  3 mL Intravenous Q12H  . vancomycin  1,250 mg Intravenous Q12H   Continuous Infusions:   Principal Problem:   UTI (urinary tract infection) Active Problems:   Chronic renal insufficiency   DVT, bilateral lower limbs   Cellulitis   Anemia   SIRS (systemic inflammatory response syndrome)   Clint Lipps, MD [facility] Pager: [phone] 06/14/2013, 10:33 AM

## 2013-06-15 DIAGNOSIS — A4901 Methicillin susceptible Staphylococcus aureus infection, unspecified site: Secondary | ICD-10-CM

## 2013-06-15 DIAGNOSIS — A4101 Sepsis due to Methicillin susceptible Staphylococcus aureus: Secondary | ICD-10-CM | POA: Diagnosis present

## 2013-06-15 DIAGNOSIS — R7881 Bacteremia: Secondary | ICD-10-CM

## 2013-06-15 LAB — CBC
HCT: 27.2 % — ABNORMAL LOW (ref 39.0–52.0)
Hemoglobin: 8.6 g/dL — ABNORMAL LOW (ref 13.0–17.0)
MCV: 89.2 fL (ref 78.0–100.0)
Platelets: 201 10*3/uL (ref 150–400)
RBC: 3.05 MIL/uL — ABNORMAL LOW (ref 4.22–5.81)
RDW: 15.8 % — ABNORMAL HIGH (ref 11.5–15.5)
WBC: 4.1 10*3/uL (ref 4.0–10.5)

## 2013-06-15 LAB — CULTURE, BLOOD (ROUTINE X 2)

## 2013-06-15 LAB — BASIC METABOLIC PANEL
CO2: 26 mEq/L (ref 19–32)
Chloride: 108 mEq/L (ref 96–112)
Creatinine, Ser: 1.03 mg/dL (ref 0.50–1.35)
Potassium: 4 mEq/L (ref 3.5–5.1)
Sodium: 141 mEq/L (ref 135–145)

## 2013-06-15 NOTE — Progress Notes (Signed)
Patient ID: Jack [name], male   DOB: 21-Oct-1945, 67 y.o.   MRN: [id number]   Pt voiding well with PVR's of about 30cc.  SP tube removed and dressing applied.

## 2013-06-15 NOTE — Progress Notes (Signed)
[facility] PROGRESS NOTE  MALACHAI SCHALK ZOX:[id number] DOB: 1945/08/23 DOA: 06/11/2013 PCP: Simone Curia, MD  Subjective: Feels okay, denies any fever chills. The urine still tinged with blood.  67 yo male just released from SNF after CIR stay.  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   Assessment/Plan:  Sepsis  -Resolved. -Likely secondary to UTI, bacteremia -Denies chills today -Maximum temperature was 103.2 on 10/29 and 5 PM. Afebrile for the past 2 days  MSSA bacteremia -Blood culture showed staph aureus, infectious disease consulted on 10/31 -Antibiotics adjusted to cefazolin. -Blood culture repeated, TTE, PICC line on Tuesday, if culture status negative.  UTI with suprapubic catheter -Catheter changed just prior to admission by Urology -Still has hematuria, patient is on Xarleto. -Urine cultures revealed staph. -Appreciate urologist help, clamp suprapubic catheter for voiding trials today.  Cellulitis around suprapubic catheter -Improving since yesterday but still tender. -Urology consulted and suggested possible removal of catheter after fever/UTI resolves. -Consider repeat CT to reassess renal hematoma and wound if fever does not resolve per Urology.  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 Imogene Burn consulted and recommendations made -Wound care consultation completed -Every other staple and sutures removed yesterday. -Areas of eschar noted surrounding incisions. Monitor closely for changes to include cellulitis, discharge.  -Vascular will follow for further removal of staples over next 2 days.  Chronic renal insufficiency - Creatinine at baseline.  Anemia (normocytic) -appears at baseline -no active bleeding. -Iron deficient.  (Ferritin elevated) -IV Iron given 10/30 -Oral Iron  supplementation started.  Leukopenia - WBC down to 3.6 from 4.6 yesterday.  - Likely due to acute bacteremia/UTI - Will follow for changes.  DVT Prophylaxis:  xeralto  Code Status: full code Family Communication: Disposition Plan: inpatient   Consultants:  Vascular and Vein - Dr. Imogene Burn  Urology - Dr. Annabell Howells  Wound Care - Gwenith Spitz, RN   Procedures:  none  Antibiotics: Cefazolin   Objective: Filed Vitals:   06/14/13 1518 06/14/13 2102 06/15/13 0225 06/15/13 0608  BP: 133/69 125/70 138/72 148/75  Pulse: 94 82 78 75  Temp: 98 F (36.7 C) 99 F (37.2 C) 97.9 F (36.6 C) 97.5 F (36.4 C)  TempSrc: Oral Oral Oral Oral  Resp: 18 17 17 17   Height:      Weight:      SpO2: 98% 97% 97% 91%    Intake/Output Summary (Last 24 hours) at 06/15/13 1206 Last data filed at 06/15/13 0900  Gross per 24 hour  Intake    480 ml  Output   2702 ml  Net  -2222 ml   Filed Weights   06/11/13 1654 06/11/13 2351  Weight: 132.904 kg (293 lb) 119.6 kg (263 lb 10.7 oz)    Exam:   General:  Wd, male, pleasant, A&O, NAD, with rigors  Cardiovascular: RRR, no murmurs, rubs or gallops,  Respiratory: CTA, no wheeze, crackles, or rales.  No increased work of breathing.  Abdomen: Soft, non-tender, non-distended, + bowel sounds, no masses.  No CVA tendderness. Suprapubic cath in place.  No evident discharge at this time.  Musculoskeletal: Able to move all 4 extremities, 5/5 strength in each  Skin: Lower extremities each have two vertical incisions on medial and lateral aspects of anterior calf closed with staples. There is erythema surrounding both incisions extending out 1 cm. There are two areas of eschar. No oozing/weaping noted. No  purulent drainage.  Data Reviewed: Basic Metabolic Panel:  Recent Labs Lab 06/11/13 1829 06/12/13 0449 06/13/13 0520 06/14/13 0610 06/15/13 0526  NA 138 139 140 139 141  K 4.0 3.8 3.7 3.7 4.0  CL 104 106 108 107 108  CO2 22 25 26 24 26    GLUCOSE 154* 117* 111* 106* 96  BUN 11 11 10 8 8   CREATININE 1.14 1.14 1.10 0.99 1.03  CALCIUM 8.3* 8.1* 8.4 8.4 8.4  MG  --  1.7  --   --   --   PHOS  --  3.8  --   --   --    Liver Function Tests:  Recent Labs Lab 06/11/13 1829 06/12/13 0449  AST 15 10  ALT 8 6  ALKPHOS 64 52  BILITOT 0.5 0.5  PROT 6.6 5.6*  ALBUMIN 2.6* 2.2*   CBC:  Recent Labs Lab 06/11/13 1829 06/12/13 0449 06/13/13 0520 06/14/13 0610 06/15/13 0526  WBC 5.7 4.6 3.6* 4.1 4.1  NEUTROABS 4.0  --   --   --   --   HGB 9.3* 8.0* 8.0* 8.3* 8.6*  HCT 29.8* 25.8* 25.4* 26.2* 27.2*  MCV 89.0 89.3 88.8 87.9 89.2  PLT 201 165 158 195 201   BNP (last 3 results)  Recent Labs  04/18/13 1245 04/27/13 1240  PROBNP 1762.0* 1260.0*     Studies: No results found.  Scheduled Meds: .  ceFAZolin (ANCEF) IV  2 g Intravenous Q8H  . docusate sodium  100 mg Oral BID  . iron polysaccharides  150 mg Oral Daily  . multivitamin with minerals  1 tablet Oral Daily  . pantoprazole  40 mg Oral Q1200  . Rivaroxaban  15 mg Oral BID WC  . [START ON 06/18/2013] rivaroxaban  20 mg Oral Q supper  . sodium chloride  3 mL Intravenous Q12H   Continuous Infusions:   Principal Problem:   UTI (urinary tract infection) Active Problems:   Chronic renal insufficiency   DVT, bilateral lower limbs   Cellulitis   Anemia   SIRS (systemic inflammatory response syndrome)   Clint Lipps, MD [facility] Pager: [phone] 06/15/2013, 12:06 PM

## 2013-06-15 NOTE — Progress Notes (Signed)
Notified [place], NP that patient had suprapubic cath removed today. Suprapubic site is draining a lot of red tinged liquid drainage into 4x4 gauze and abdominal pad dressing. Have changed dressing twice since 2000. Patient requesting bag to be placed over suprapubic site tonight so dressing will not have to be changed tonight while he is trying to sleep. Patient is unable to use urinal at this time all drainage going through suprapubic site. [place], NP gave order to place colostomy bag over site where suprapubic catheter was removed today. Will continue to monitor patient. Nelda Marseille, RN

## 2013-06-15 NOTE — Progress Notes (Signed)
Notified Dr. Isabel Caprice that patient had suprapubic cath removed today. Placed colostomy bag to suprapubic cath site, pt having red drainage from site. Patient unable to void through penis since having suprapubic cath removed. Patient having severe abdominal pain now that is in middle of abdomen that goes down each side of abdomen. Performed bladder scanner at bedside results were zero. Dr. Isabel Caprice stated that we can I&O cath x1 if patient wants to be I&O cath to see if any urine is there.  Patient stated that he didn't want to be I&O cath now he wanted to try and do it the natural way by using the urinal. Told patient to call nurse if he wanted to be I&O cath. Will continue to monitor patient. Nelda Marseille, RN

## 2013-06-15 NOTE — Progress Notes (Signed)
Patient ID: Jack [name], male   DOB: 1945-11-09, 67 y.o.   MRN: [id number]  An attempt was made yesterday for a voiding trial but he had a lot of leakage around the SP tube.   Per report he may have voided 100cc and had a PVR of 100cc but more urine leaked around the tube with the void.     I am going to have the SP tube put on traction to try to better occlude the tract and will have the voiding trial repeated with better recording of voided and residual volumes to determine if the SP tube can be removed.

## 2013-06-15 NOTE — Progress Notes (Signed)
      [facility] Antimicrobial Management Team Staphylococcus aureus bacteremia   Staphylococcus aureus bacteremia (SAB) is associated with a high rate of complications and mortality.  Specific aspects of clinical management are critical to optimizing the outcome of patients with SAB.  Therefore, the [facility] Antimicrobial Management Team [facility]) has initiated an intervention aimed at improving the management of SAB at [facility].  To do so, Infectious Diseases physicians are providing an evidence-based consult for the management of all patients with SAB.     Yes No Comments  Perform follow-up blood cultures (even if the patient is afebrile) to ensure clearance of bacteremia [x]  []  Ordered 11/2  Remove vascular catheter and obtain follow-up blood cultures after the removal of the catheter []  []  NA  Perform echocardiography to evaluate for endocarditis (transthoracic ECHO is 40-50% sensitive, TEE is > 90% sensitive) []  []  Please keep in mind, that neither test can definitively EXCLUDE endocarditis, and that should clinical suspicion remain high for endocarditis the patient should then still be treated with an "endocarditis" duration of therapy = 6 weeks TTE done, supoptimal  Consult electrophysiologist to evaluate implanted cardiac device (pacemaker, ICD) []  []  NA  Ensure source control [x]  []  Have all abscesses been drained effectively? Have deep seeded infections (septic joints or osteomyelitis) had appropriate surgical debridement?  Investigate for "metastatic" sites of infection [x]  []  Does the patient have ANY symptom or physical exam finding that would suggest a deeper infection (back or neck pain that may be suggestive of vertebral osteomyelitis or epidural abscess, muscle pain that could be a symptom of pyomyositis)?  Keep in mind that for deep seeded infections MRI imaging with contrast is preferred rather than other often insensitive tests such as plain x-rays, especially early in a  patient's presentation.  Change antibiotic therapy to Cefazolin [x]  []  Beta-lactam antibiotics are preferred for MSSA due to higher cure rates.   If on Vancomycin, goal trough should be 15 - 20 mcg/mL  Estimated duration of IV antibiotic therapy:    6 weeks []  []  Consult case management for probably prolonged outpatient IV antibiotic therapy    Georgina Pillion, PharmD, BCPS Clinical Pharmacist Pager: [phone] 06/15/2013 4:46 PM

## 2013-06-15 NOTE — Progress Notes (Signed)
[facility]  Date of Admission:  06/11/2013  Antibiotics: Antibiotics Given (last 72 hours)   Date/Time Action Medication Dose Rate   06/12/13 1411 Given   piperacillin-tazobactam (ZOSYN) IVPB 3.375 g 3.375 g 12.5 mL/hr   06/12/13 1947 Given   vancomycin (VANCOCIN) 1,250 mg in sodium chloride 0.9 % 250 mL IVPB 1,250 mg 166.7 mL/hr   06/12/13 2111 Given   piperacillin-tazobactam (ZOSYN) IVPB 3.375 g 3.375 g 12.5 mL/hr   06/13/13 0513 Given   piperacillin-tazobactam (ZOSYN) IVPB 3.375 g 3.375 g 12.5 mL/hr   06/13/13 0759 Given   vancomycin (VANCOCIN) 1,250 mg in sodium chloride 0.9 % 250 mL IVPB 1,250 mg 166.7 mL/hr   06/13/13 1401 Given   piperacillin-tazobactam (ZOSYN) IVPB 3.375 g 3.375 g 12.5 mL/hr   06/13/13 1911 Given   ceFAZolin (ANCEF) IVPB 2 g/50 mL premix 2 g 100 mL/hr   06/13/13 1943 Given   vancomycin (VANCOCIN) 1,250 mg in sodium chloride 0.9 % 250 mL IVPB 1,250 mg 166.7 mL/hr   06/13/13 2104 Given   ceFAZolin (ANCEF) IVPB 2 g/50 mL premix 2 g 100 mL/hr   06/14/13 0509 Given   ceFAZolin (ANCEF) IVPB 2 g/50 mL premix 2 g 100 mL/hr   06/14/13 0743 Given   vancomycin (VANCOCIN) 1,250 mg in sodium chloride 0.9 % 250 mL IVPB 1,250 mg 166.7 mL/hr   06/14/13 1427 Given   ceFAZolin (ANCEF) IVPB 2 g/50 mL premix 2 g 100 mL/hr   06/14/13 2048 Given   vancomycin (VANCOCIN) 1,250 mg in sodium chloride 0.9 % 250 mL IVPB 1,250 mg 166.7 mL/hr   06/14/13 2230 Given   ceFAZolin (ANCEF) IVPB 2 g/50 mL premix 2 g 100 mL/hr   06/15/13 1610 Given   ceFAZolin (ANCEF) IVPB 2 g/50 mL premix 2 g 100 mL/hr   06/15/13 0818 Given   vancomycin (VANCOCIN) 1,250 mg in sodium chloride 0.9 % 250 mL IVPB 1,250 mg 166.7 mL/hr      Subjective: No success with removing catheter so far  Objective: Temp:  [97.5 F (36.4 C)-99 F (37.2 C)] 97.5 F (36.4 C) (11/02 9604) Pulse Rate:  [75-94] 75 (11/02 0608) Resp:  [17-18] 17 (11/02 0608) BP: (125-148)/(69-75)  148/75 mmHg (11/02 0608) SpO2:  [91 %-98 %] 91 % (11/02 0608)  General: Awake, alert, nad Skin: no rashes, bilateral lower extremity incision areas with mild surrounding erythema, no significant warmth, dried lesions Lungs: CTA B Cor: RRR without m Abdomen: soft, nt, nd, +bs Ext: no edema  Lab Results Lab Results  Component Value Date   WBC 4.1 06/15/2013   HGB 8.6* 06/15/2013   HCT 27.2* 06/15/2013   MCV 89.2 06/15/2013   PLT 201 06/15/2013    Lab Results  Component Value Date   CREATININE 1.03 06/15/2013   BUN 8 06/15/2013   NA 141 06/15/2013   K 4.0 06/15/2013   CL 108 06/15/2013   CO2 26 06/15/2013    Lab Results  Component Value Date   ALT 6 06/12/2013   AST 10 06/12/2013   ALKPHOS 52 06/12/2013   BILITOT 0.5 06/12/2013      Microbiology: Recent Results (from the past 240 hour(s))  CULTURE, BLOOD (ROUTINE X 2)     Status: None   Collection Time    06/11/13  6:30 PM      Result Value Range Status   Specimen Description BLOOD ARM LEFT   Final   Special Requests BOTTLES DRAWN AEROBIC AND ANAEROBIC 6CC  Final   Culture  Setup Time     Final   Value: 06/12/2013 02:38     Performed at [facility]   Culture     Final   Value: STAPHYLOCOCCUS AUREUS     Note: RIFAMPIN AND GENTAMICIN SHOULD NOT BE USED AS SINGLE DRUGS FOR TREATMENT OF STAPH INFECTIONS.     Note: Gram Stain Report Called to,Read Back By and Verified With: ARIEL MOHAMMED ON 06/13/2013 AT 12:48A BY Serafina Mitchell     Performed at [facility]   Report Status 06/15/2013 FINAL   Final   Organism ID, Bacteria STAPHYLOCOCCUS AUREUS   Final  URINE CULTURE     Status: None   Collection Time    06/11/13  7:27 PM      Result Value Range Status   Specimen Description URINE, RANDOM   Final   Special Requests NONE   Final   Culture  Setup Time     Final   Value: 06/12/2013 03:06     Performed at [facility] Count     Final   Value: >=100,000 COLONIES/ML     Performed at [facility]   Culture     Final   Value: STAPHYLOCOCCUS SPECIES     Note: RIFAMPIN AND GENTAMICIN SHOULD NOT BE USED AS SINGLE DRUGS FOR TREATMENT OF STAPH INFECTIONS.     Performed at [facility]   Report Status PENDING   Incomplete    Studies/Results: No results found.  Assessment/Plan: 1) MSSA bacteremia - on cefazolin and will need a prolonged course.   Repeat blood cultures sent today.  -picc Tuesday if repeat cultures remain negative.   -TTE  Staci Righter, MD [email or web address] [phone] pager   [phone] cell 06/15/2013, 10:58 AM

## 2013-06-15 NOTE — Progress Notes (Signed)
Placed colostomy drainage bag over suprapubic cath site where draining red drainage. Patient tolerated well. Will continue to monitor patient. Nelda Marseille, RN

## 2013-06-15 NOTE — Progress Notes (Signed)
1056: catheter bag emptied . Bladder scan 15mL. Pt attempt to urinate in urinal 0mL.  1100: Suprapubic catheter plugged and traction applied. Pt told to urinate in urinal and alert the nurse if leaking around catheter site occurs.  11:45 AM Pt bladder scanned. 35mL. Attempted to urinate in urinal 0mL. No leakage around catheter site.  12:50 PM Pt voided 100 mL in urinal. 33mL residual via bladder scan.  2:23 PM Pt voided 100 mL. 28mL residual via bladder scan.  3:29 PM Pt voided 75mL. 22mL residual via bladder scan.    Aldean Ast

## 2013-06-16 DIAGNOSIS — I517 Cardiomegaly: Secondary | ICD-10-CM

## 2013-06-16 LAB — URINE CULTURE

## 2013-06-16 NOTE — Progress Notes (Signed)
[facility]   Day # 4 cefazolin  Subjective: No new complaints   Antibiotics:  Anti-infectives   Start     Dose/Rate Route Frequency Ordered Stop   06/13/13 1600  ceFAZolin (ANCEF) IVPB 2 g/50 mL premix     2 g 100 mL/hr over 30 Minutes Intravenous 3 times per day 06/13/13 1507     06/12/13 0800  vancomycin (VANCOCIN) 1,250 mg in sodium chloride 0.9 % 250 mL IVPB  Status:  Discontinued     1,250 mg 166.7 mL/hr over 90 Minutes Intravenous Every 12 hours 06/11/13 1947 06/15/13 0916   06/12/13 0200  piperacillin-tazobactam (ZOSYN) IVPB 3.375 g  Status:  Discontinued     3.375 g 12.5 mL/hr over 240 Minutes Intravenous Every 8 hours 06/11/13 1947 06/11/13 2341   06/12/13 0100  piperacillin-tazobactam (ZOSYN) IVPB 3.375 g  Status:  Discontinued     3.375 g 12.5 mL/hr over 240 Minutes Intravenous 3 times per day 06/11/13 2341 06/13/13 1446   06/11/13 2015  vancomycin (VANCOCIN) 2,500 mg in sodium chloride 0.9 % 500 mL IVPB     2,500 mg 250 mL/hr over 120 Minutes Intravenous  Once 06/11/13 2001 06/11/13 2232   06/11/13 2000  vancomycin (VANCOCIN) 2,500 mg in sodium chloride 0.9 % 500 mL IVPB  Status:  Discontinued     2,500 mg 250 mL/hr over 120 Minutes Intravenous  Once 06/11/13 1947 06/11/13 1951   06/11/13 2000  vancomycin (VANCOCIN) 1,500 mg in sodium chloride 0.9 % 500 mL IVPB  Status:  Discontinued     1,500 mg 250 mL/hr over 120 Minutes Intravenous  Once 06/11/13 1951 06/11/13 2001   06/11/13 1915  piperacillin-tazobactam (ZOSYN) IVPB 3.375 g     3.375 g 100 mL/hr over 30 Minutes Intravenous  Once 06/11/13 1901 06/11/13 2030   06/11/13 1915  vancomycin (VANCOCIN) IVPB 1000 mg/200 mL premix  Status:  Discontinued     1,000 mg 200 mL/hr over 60 Minutes Intravenous  Once 06/11/13 1901 06/11/13 1944      Medications: Scheduled Meds: .  ceFAZolin (ANCEF) IV  2 g Intravenous Q8H  . docusate sodium  100 mg Oral BID  . iron polysaccharides  150 mg  Oral Daily  . multivitamin with minerals  1 tablet Oral Daily  . pantoprazole  40 mg Oral Q1200  . Rivaroxaban  15 mg Oral BID WC  . [START ON 06/18/2013] rivaroxaban  20 mg Oral Q supper  . sodium chloride  3 mL Intravenous Q12H   Continuous Infusions:  PRN Meds:.acetaminophen, acetaminophen, albuterol, ALPRAZolam, morphine injection, ondansetron (ZOFRAN) IV, ondansetron, oxyCODONE   Objective: Weight change:   Intake/Output Summary (Last 24 hours) at 06/16/13 1516 Last data filed at 06/16/13 1406  Gross per 24 hour  Intake    930 ml  Output   1375 ml  Net   -445 ml   Blood pressure 143/70, pulse 93, temperature 99 F (37.2 C), temperature source Oral, resp. rate 18, height 6' (1.829 m), weight 263 lb 10.7 oz (119.6 kg), SpO2 97.00%. Temp:  [98 F (36.7 C)-99 F (37.2 C)] 99 F (37.2 C) (11/03 1406) Pulse Rate:  [71-93] 93 (11/03 1406) Resp:  [17-18] 18 (11/03 1406) BP: (138-155)/(70-78) 143/70 mmHg (11/03 1406) SpO2:  [97 %-98 %] 97 % (11/03 1406)  Physical Exam: General: Alert and awake, oriented x3, not in any acute distress. HEENT: EOMI CVS regular rate, normal r,  no murmur rubs or gallops Chest: clear to  auscultation bilaterally, no wheezing, rales or rhonchi Abdomen: suprapubic site with dressing,  Neuro: nonfocal  Lab Results:  Recent Labs  06/14/13 0610 06/15/13 0526  WBC 4.1 4.1  HGB 8.3* 8.6*  HCT 26.2* 27.2*  PLT 195 201    BMET  Recent Labs  06/14/13 0610 06/15/13 0526  NA 139 141  K 3.7 4.0  CL 107 108  CO2 24 26  GLUCOSE 106* 96  BUN 8 8  CREATININE 0.99 1.03  CALCIUM 8.4 8.4    Micro Results: Recent Results (from the past 240 hour(s))  CULTURE, BLOOD (ROUTINE X 2)     Status: None   Collection Time    06/11/13  6:30 PM      Result Value Range Status   Specimen Description BLOOD ARM LEFT   Final   Special Requests BOTTLES DRAWN AEROBIC AND ANAEROBIC 6CC   Final   Culture  Setup Time     Final   Value: 06/12/2013 02:38      Performed at [facility]   Culture     Final   Value: STAPHYLOCOCCUS AUREUS     Note: RIFAMPIN AND GENTAMICIN SHOULD NOT BE USED AS SINGLE DRUGS FOR TREATMENT OF STAPH INFECTIONS.     Note: Gram Stain Report Called to,Read Back By and Verified With: ARIEL MOHAMMED ON 06/13/2013 AT 12:48A BY Serafina Mitchell     Performed at [facility]   Report Status 06/15/2013 FINAL   Final   Organism ID, Bacteria STAPHYLOCOCCUS AUREUS   Final  URINE CULTURE     Status: None   Collection Time    06/11/13  7:27 PM      Result Value Range Status   Specimen Description URINE, RANDOM   Final   Special Requests NONE   Final   Culture  Setup Time     Final   Value: 06/12/2013 03:06     Performed at [facility] Count     Final   Value: >=100,000 COLONIES/ML     Performed at [facility]   Culture     Final   Value: STAPHYLOCOCCUS AUREUS     Note: RIFAMPIN AND GENTAMICIN SHOULD NOT BE USED AS SINGLE DRUGS FOR TREATMENT OF STAPH INFECTIONS.     Performed at [facility]   Report Status 06/16/2013 FINAL   Final   Organism ID, Bacteria STAPHYLOCOCCUS AUREUS   Final  CULTURE, BLOOD (ROUTINE X 2)     Status: None   Collection Time    06/15/13  1:58 AM      Result Value Range Status   Specimen Description BLOOD RIGHT ARM   Final   Special Requests BOTTLES DRAWN AEROBIC AND ANAEROBIC 10CC EA   Final   Culture  Setup Time     Final   Value: 06/15/2013 13:52     Performed at [facility]   Culture     Final   Value:        BLOOD CULTURE RECEIVED NO GROWTH TO DATE CULTURE WILL BE HELD FOR 5 DAYS BEFORE ISSUING A FINAL NEGATIVE REPORT     Performed at [facility]   Report Status PENDING   Incomplete  CULTURE, BLOOD (ROUTINE X 2)     Status: None   Collection Time    06/15/13  2:03 AM      Result Value Range Status   Specimen Description BLOOD RIGHT HAND   Final   Special Requests  BOTTLES DRAWN AEROBIC ONLY 10CC   Final   Culture  Setup Time      Final   Value: 06/15/2013 13:52     Performed at [facility]   Culture     Final   Value:        BLOOD CULTURE RECEIVED NO GROWTH TO DATE CULTURE WILL BE HELD FOR 5 DAYS BEFORE ISSUING A FINAL NEGATIVE REPORT     Performed at [facility]   Report Status PENDING   Incomplete    Studies/Results: No results found.    Assessment/Plan: Jack [name] is a 67 y.o. male with  MSSA bacteremia after TURP. TTE supoptimal images. Suprapubic out yesterday, foley in today   #1 MSSA bacteremia: with MSSA in urine in case where this may indeed have originated in urine. Regardless should have protracted IV abx therapy.   --I would give him 6 weeks of IV ancef with day #1 being 06/15/13 IF cultures from that date indeed remain negative.  --Please do not placed central line until we have documented that the cultures the second are negative at 72 hours.  #2 Screening: HIV negative, check hep panel    LOS: 5 days   Acey Lav 06/16/2013, 3:16 PM

## 2013-06-16 NOTE — Progress Notes (Signed)
Patient had 300cc of bright red drainage in bag placed over area where pt had suprapubic cath removed, emptied bag. Patient voided 50cc of bright red urine in urinal. Will continue to monitor patient. Nelda Marseille, RN

## 2013-06-16 NOTE — Evaluation (Addendum)
Occupational Therapy Evaluation Patient Details Name: Jack [name] MRN: [id number] DOB: 08-08-1946 Today's Date: 06/16/2013 Time: 9147-8295 OT Time Calculation (min): 19 min  OT Assessment / Plan / Recommendation History of present illness 67 yo male just released from SNF after CIR stay.  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 Admitted for suspected sepsis.    Clinical Impression   Pt presents with below problem list. Pt will benefit from acute OT to increase independence and activity tolerance prior to d/c. Recommending SNF for additional rehab.     OT Assessment  Patient needs continued OT Services    Follow Up Recommendations  SNF;Supervision/Assistance - 24 hour    Barriers to Discharge      Equipment Recommendations  Other (comment) (defer to SNF)    Recommendations for Other Services    Frequency  Min 2X/week    Precautions / Restrictions Precautions Precautions: Fall Restrictions Weight Bearing Restrictions: No   Pertinent Vitals/Pain No pain reported.     ADL  Eating/Feeding: Independent Where Assessed - Eating/Feeding: Chair Grooming: Performed;Wash/dry hands;Wash/dry face;Supervision/safety;Other (comment);Set up (would be at Setup/Supervision level if supplies not there) Where Assessed - Grooming: Supported standing;Unsupported standing Upper Body Bathing: Set up;Supervision/safety Where Assessed - Upper Body Bathing: Unsupported sitting Lower Body Bathing: Min guard Where Assessed - Lower Body Bathing: Supported sit to stand Upper Body Dressing: Set up;Supervision/safety Where Assessed - Upper Body Dressing: Unsupported sitting Lower Body Dressing: Min guard Where Assessed - Lower Body Dressing: Supported sit to stand Toilet Transfer: Hydrographic surveyor Method: Sit to Barista: Other (comment) (from chair) Toileting -  Clothing Manipulation and Hygiene: Minimal assistance Where Assessed - Glass blower/designer Manipulation and Hygiene: Other (comment) (sit to stand from chair) Tub/Shower Transfer Method: Not assessed Equipment Used: Gait belt;Rolling walker;Other (comment) (gave pt theraband) Transfers/Ambulation Related to ADLs: Min guard for transfers; OT told him to bring walker back with him when going to sit on bed and also to keep walker in front of him when going to sink. ADL Comments: Educated on energy conservation as pt has decreased activity tolerance and was breathing heavily.  Pt able to don/doff socks and shoes in chair-used foot rest to assist and explained he can also do this at home. OT explained to try to bring leg up to assist with socks and pt able to do so.  Discussed using 3 in 1 over commode as pt has elevated commode at home but uses a handle to help him stand, so OT explained 3 in 1 is safer.  OT demonstrated theraband exercises for UE's. OT also demonstrated chair push ups.    OT Diagnosis: Generalized weakness  OT Problem List: Decreased strength;Decreased activity tolerance;Impaired balance (sitting and/or standing);Decreased knowledge of use of DME or AE;Decreased knowledge of precautions OT Treatment Interventions: Self-care/ADL training;Therapeutic exercise;DME and/or AE instruction;Therapeutic activities;Patient/family education;Balance training;Energy conservation   OT Goals(Current goals can be found in the care plan section) Acute Rehab OT Goals Patient Stated Goal: not stated OT Goal Formulation: With patient Time For Goal Achievement: 06/23/13 Potential to Achieve Goals: Good ADL Goals Pt Will Perform Upper Body Bathing: with modified independence;sitting Pt Will Perform Lower Body Bathing: with modified independence;sit to/from stand Pt Will Transfer to Toilet: with modified independence;ambulating (3 in 1 over commode) Pt Will Perform Toileting - Clothing Manipulation and  hygiene: with modified independence;sit to/from stand Additional ADL Goal #1: Pt will independently perform  HEP to both UE's to increase strength. Additional ADL Goal #2: Pt will independently verbalize and demonstrate 3/3 energy conservation techniques.  Visit Information  Last OT Received On: 06/16/13 Assistance Needed: +1 History of Present Illness: 67 yo male just released from SNF after CIR stay.  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 Admitted for suspected sepsis.        Prior Functioning     Home Living Family/patient expects to be discharged to:: Skilled nursing facility Home Equipment: Bedside commode;Walker - 2 wheels;Hand held shower head;Hospital bed;Tub bench;Adaptive equipment (walking sticks) Adaptive Equipment: Sock aid Prior Function Level of Independence: Needs assistance Gait / Transfers Assistance Needed: walking with RW, min assist and wheelchair behind ADL's / Homemaking Assistance Needed: reports he still needs some assist with cleaning self Comments: independent prior to medical decline Communication Communication: No difficulties Dominant Hand: Right         Vision/Perception     Cognition  Cognition Arousal/Alertness: Awake/alert Behavior During Therapy: WFL for tasks assessed/performed Overall Cognitive Status: Within Functional Limits for tasks assessed    Extremity/Trunk Assessment Upper Extremity Assessment Upper Extremity Assessment: RUE deficits/detail;LUE deficits/detail RUE Deficits / Details: weakness; Previous shoulder injury approximately 115 degrees AROM shoulder flexion LUE Deficits / Details: weakness Lower Extremity Assessment Lower Extremity Assessment: Defer to PT evaluation     Mobility Bed Mobility Bed Mobility: Sit to Supine Sit to Supine: 5: Supervision Transfers Transfers: Sit to Stand;Stand to Sit Sit to  Stand: 4: Min guard;With upper extremity assist;From chair/3-in-1 Stand to Sit: 4: Min guard;To bed Details for Transfer Assistance: cues for technique     Exercise     Balance     End of Session OT - End of Session Equipment Utilized During Treatment: Gait belt;Rolling walker Activity Tolerance: Patient tolerated treatment well  GO     Earlie Raveling OTR/L [phone] 06/16/2013, 1:43 PM

## 2013-06-16 NOTE — Progress Notes (Signed)
Patient ID: Jack [name], male   DOB: 1945-11-26, 67 y.o.   MRN: [id number]   [name] is voiding only small amounts and primarily leaking from the SP tube site.      I would like to have an 64fr foley inserted to drain the bladder and facilitate healing of the tube tract.   He could be discharged with the catheter to leg bag drainage and have it removed later this week.

## 2013-06-16 NOTE — Progress Notes (Signed)
ANTIBIOTIC CONSULT NOTE - FOLLOW UP  Pharmacy Consult for Cefazolin Indication: Staph bacteremia, UTI, cellulitis around suprapubic catheter  Patient Measurements: Height: 6' (182.9 cm) Weight: 263 lb 10.7 oz (119.6 kg) IBW/kg (Calculated) : 77.6  Vital Signs: Temp: 98 F (36.7 C) (11/03 0442) Temp src: Oral (11/03 0442) BP: 138/70 mmHg (11/03 0442) Pulse Rate: 71 (11/03 0442) Intake/Output from previous day: 11/02 0701 - 11/03 0700 In: 880 [P.O.:480; IV Piggyback:400] Out: 1375 [Urine:775]  Labs:  Recent Labs  06/14/13 0610 06/15/13 0526  WBC 4.1 4.1  HGB 8.3* 8.6*  PLT 195 201  CREATININE 0.99 1.03   Estimated Creatinine Clearance: 92.9 ml/min (by C-G formula based on Cr of 1.03).  Microbiology: Recent Results (from the past 720 hour(s))  URINE CULTURE     Status: None   Collection Time    05/27/13  2:22 PM      Result Value Range Status   Specimen Description URINE, CLEAN CATCH   Final   Special Requests NONE   Final   Culture  Setup Time     Final   Value: 05/27/2013 15:49     Performed at [facility] Count     Final   Value: NO GROWTH     Performed at [facility]   Culture     Final   Value: NO GROWTH     Performed at [facility]   Report Status 05/28/2013 FINAL   Final  CULTURE, BLOOD (ROUTINE X 2)     Status: None   Collection Time    06/11/13  6:30 PM      Result Value Range Status   Specimen Description BLOOD ARM LEFT   Final   Special Requests BOTTLES DRAWN AEROBIC AND ANAEROBIC [facility]   Final   Culture  Setup Time     Final   Value: 06/12/2013 02:38     Performed at [facility]   Culture     Final   Value: STAPHYLOCOCCUS AUREUS     Note: RIFAMPIN AND GENTAMICIN SHOULD NOT BE USED AS SINGLE DRUGS FOR TREATMENT OF STAPH INFECTIONS.     Note: Gram Stain Report Called to,Read Back By and Verified With: ARIEL MOHAMMED ON 06/13/2013 AT 12:48A BY Serafina Mitchell     Performed at [facility]   Report Status  06/15/2013 FINAL   Final   Organism ID, Bacteria STAPHYLOCOCCUS AUREUS   Final  URINE CULTURE     Status: None   Collection Time    06/11/13  7:27 PM      Result Value Range Status   Specimen Description URINE, RANDOM   Final   Special Requests NONE   Final   Culture  Setup Time     Final   Value: 06/12/2013 03:06     Performed at [facility] Count     Final   Value: >=100,000 COLONIES/ML     Performed at [facility]   Culture     Final   Value: STAPHYLOCOCCUS AUREUS     Note: RIFAMPIN AND GENTAMICIN SHOULD NOT BE USED AS SINGLE DRUGS FOR TREATMENT OF STAPH INFECTIONS.     Performed at [facility]   Report Status 06/16/2013 FINAL   Final   Organism ID, Bacteria STAPHYLOCOCCUS AUREUS   Final  CULTURE, BLOOD (ROUTINE X 2)     Status: None   Collection Time    06/15/13  1:58 AM  Result Value Range Status   Specimen Description BLOOD RIGHT ARM   Final   Special Requests BOTTLES DRAWN AEROBIC AND ANAEROBIC 10CC EA   Final   Culture  Setup Time     Final   Value: 06/15/2013 13:52     Performed at [facility]   Culture     Final   Value:        BLOOD CULTURE RECEIVED NO GROWTH TO DATE CULTURE WILL BE HELD FOR 5 DAYS BEFORE ISSUING A FINAL NEGATIVE REPORT     Performed at [facility]   Report Status PENDING   Incomplete  CULTURE, BLOOD (ROUTINE X 2)     Status: None   Collection Time    06/15/13  2:03 AM      Result Value Range Status   Specimen Description BLOOD RIGHT HAND   Final   Special Requests BOTTLES DRAWN AEROBIC ONLY 10CC   Final   Culture  Setup Time     Final   Value: 06/15/2013 13:52     Performed at [facility]   Culture     Final   Value:        BLOOD CULTURE RECEIVED NO GROWTH TO DATE CULTURE WILL BE HELD FOR 5 DAYS BEFORE ISSUING A FINAL NEGATIVE REPORT     Performed at [facility]   Report Status PENDING   Incomplete   Assessment:  Day # 4 cefazolin for MSSA bacteremia. Total abx day  # 6. MSSA bacteremia from suprapubic catheter source.  SP cath removed 11/2.  TEE ordered.  BUN/creatinine stable.   Recent renal hematoma. Recent fasciotomies, noted mild erythema and crusting noted. WBC wnl, afebrile,  Repeat BC 11/2 ngtd.  Vanc 10/29>> Zosyn 10/29-10/31 Ancef 10/31>>  10/29 - BC x 2 - GPC> staph aureus R to penicillin but sens to oxacilin 10/29 - Urine - Staph species 10/29 (?) Wound cx - no sample? 11/2 bcx2 ngtd  Plan:  1. Continue Cefazolin 2 grams IV q8hrs. Plan is for PICC once repeat BC remain negative and DC home on IV cefazolin 2. F/u TEE, renal function and clinical progress. Herby Abraham, Pharm.D. [phone] 06/16/2013 10:33 AM

## 2013-06-16 NOTE — Care Management Note (Signed)
    Page 1 of 1   06/17/2013     4:41:57 PM   CARE MANAGEMENT NOTE 06/17/2013  Patient:  Jack [name], Jack [name]   Account Number:  [id number]  Date Initiated:  06/16/2013  Documentation initiated by:  Letha Cape  Subjective/Objective Assessment:   dx uti,MSSA bacteremia  admit- lives with spouse.     Action/Plan:   pt eval- rec snf   Anticipated DC Date:  06/17/2013   Anticipated DC Plan:  SKILLED NURSING FACILITY  In-house referral  Clinical Social Worker      DC Planning Services  CM consult      Choice offered to / List presented to:             Status of service:  Completed, signed off Medicare Important Message given?   (If response is "NO", the following Medicare IM given date fields will be blank) Date Medicare IM given:   Date Additional Medicare IM given:    Discharge Disposition:  SKILLED NURSING FACILITY  Per UR Regulation:  Reviewed for med. necessity/level of care/duration of stay  If discussed at Long Length of Stay Meetings, dates discussed:    Comments:  06/16/13 16:50 Letha Cape RN, BSN [phone] patient is for snf tomorrow, CSW following.  Awaiting cx to come back, if neg put picc in for iv abx and dc to snf.

## 2013-06-16 NOTE — Evaluation (Signed)
Physical Therapy Evaluation Patient Details Name: Jack [name] MRN: [id number] DOB: Sep 30, 1945 Today's Date: 06/16/2013 Time: 4098-1191 PT Time Calculation (min): 28 min  PT Assessment / Plan / Recommendation History of Present Illness  67 yo male just released from SNF after CIR stay.  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 Admitted for suspected sepsis.   Clinical Impression  Pt moving well, min assist for ambulation x ~175' with RW however not quite at a level safe enough to go home with wife. Recommend pt continue SNF rehabilitation program prior to D/C home. Will follow acutely to continue to work on mobility gains.     PT Assessment  Patient needs continued PT services    Follow Up Recommendations  SNF;Supervision/Assistance - 24 hour          Equipment Recommendations   (to be determined by SNF)       Frequency Min 2X/week    Precautions / Restrictions Precautions Precautions: Fall   Pertinent Vitals/Pain Some "healing" sensation in abdomen but pt denies pain      Mobility  Bed Mobility Supine to Sit: 5: Supervision;HOB flat;With rails Sitting - Scoot to Edge of Bed: 4: Min guard Details for Bed Mobility Assistance: Relies on rails but good carryover of prior training. Transfers Transfers: Sit to Stand;Stand to Sit Sit to Stand: 4: Min guard Stand to Sit: 5: Supervision Details for Transfer Assistance: Cues for gaining balance upon standing. Good demonstration of UE placement.  Ambulation/Gait Ambulation/Gait Assistance: 4: Min assist Ambulation Distance (Feet): 175 Feet Assistive device: Rolling walker Ambulation/Gait Assistance Details: Cues for monitoring of fatigue and being aware of available areas for rest, making sure he has enough reserve energy to reach resting areas. Gait Pattern: Step-to pattern;Wide base of support (decreased foot  clearance.) Gait velocity: slow Stairs: Yes Wheelchair Mobility Wheelchair Mobility: No    Exercises General Exercises - Lower Extremity Ankle Circles/Pumps: AROM;20 reps;Supine   PT Diagnosis: Difficulty walking;Acute pain;Generalized weakness;Abnormality of gait  PT Problem List: Decreased strength;Decreased range of motion;Decreased activity tolerance;Decreased mobility;Pain;Obesity;Decreased balance PT Treatment Interventions: DME instruction;Gait training;Functional mobility training;Therapeutic activities;Therapeutic exercise;Balance training;Neuromuscular re-education;Patient/family education     PT Goals(Current goals can be found in the care plan section) Acute Rehab PT Goals Patient Stated Goal: To get home in a week or so after SNF stay PT Goal Formulation: With patient Time For Goal Achievement: 06/23/13 Potential to Achieve Goals: Good  Visit Information  Last PT Received On: 06/16/13 Assistance Needed: +1 History of Present Illness: 67 yo male just released from SNF after CIR stay.  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 Admitted for suspected sepsis.        Prior Functioning  Home Living Family/patient expects to be discharged to:: Skilled nursing facility Home Equipment:  (walking sticks) Prior Function Level of Independence: Needs assistance Gait / Transfers Assistance Needed: walking with RW, min assist and wheelchair behind ADL's / Homemaking Assistance Needed: reports he still needs some assist with cleaning self Comments: independent prior to medical decline Communication Communication: No difficulties Dominant Hand: Right    Cognition  Cognition Arousal/Alertness: Awake/alert Behavior During Therapy: WFL for tasks assessed/performed Overall Cognitive Status: Within Functional Limits for tasks assessed    Extremity/Trunk Assessment Upper  Extremity Assessment Upper Extremity Assessment: Defer to OT evaluation Lower Extremity Assessment RLE Deficits / Details: grossly >/= 3+/5,  ROM slightly limited by residual edema RLE Sensation:  (WFL per pt, slightly impaired with evaluation) LLE Deficits / Details: grossly >/= 3+/5, ROM slightly limited by residual edema LLE Sensation:  (WFL per pt, slightly impaired with evaluation) Cervical / Trunk Assessment Cervical / Trunk Assessment: Normal   Balance Balance Balance Assessed: Yes Static Standing Balance Static Standing - Balance Support: Left upper extremity supported Static Standing - Level of Assistance: 5: Stand by assistance;4: Min assist Static Standing - Comment/# of Minutes: Slight unsteadiness in standing when UE support lessened. High Level Balance High Level Balance Activites: Side stepping High Level Balance Comments: up to min assist with RW  End of Session PT - End of Session Equipment Utilized During Treatment: Gait belt Activity Tolerance: Patient tolerated treatment well Patient left: in chair;with call bell/phone within reach;with nursing/sitter in room Nurse Communication: Mobility status  GP     Wilhemina Bonito 06/16/2013, 8:27 AM

## 2013-06-16 NOTE — Progress Notes (Signed)
Order received to place foley catheter, 18 [place]. Patient tolerated it well. Dark red urine received. Continue to monitor.

## 2013-06-16 NOTE — Progress Notes (Addendum)
[facility] PROGRESS NOTE  Jack [name] ION:[id number] DOB: [birth year] DOA: 06/11/2013 PCP: Simone Curia, MD  67 yo male just released from SNF after CIR stay. 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   Assessment/Plan:  Sepsis  -Resolved.  -Likely secondary to UTI, bacteremia  -Denies chills today  -Maximum temperature was 103.2 on 10/29 and 5 PM. Afebrile since 10/30.  MSSA bacteremia  -Blood culture showed MSSA, infectious disease consulted on 10/31  -Antibiotics adjusted to cefazolin.  -Blood culture repeated, negative so far -TTE and PICC on 11/4 if blood culture is still negative  UTI with suprapubic catheter  -SP catheter removed by urology, drain bag in place to catch leaks -18 french catheter placed in urethra to help patient void  -Still has hematuria, patient is on Xarleto.  -Urine cultures revealed staph.    Cellulitis around suprapubic catheter  -Pt complains of tenderness around SP catheter site  -Erythema improving on antibiotic therapy. -Consider repeat CT to reassess renal hematoma and wound if fever does not resolve per Urology.   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 Imogene Burn consulted and recommendations made  -Wound care consultation completed  -staples removed. -Areas of eschar noted surrounding incisions. Legs / incision sites looking gradually better.  Chronic renal insufficiency  -Creatinine at baseline (1.03)  Anemia (normocytic)  -appears at baseline  -no active bleeding.  -Iron deficient. (Ferritin elevated)  -IV Iron given 10/30  -Oral Iron supplementation started.   Leukopenia  - WBC at 4.1  - Likely due to acute bacteremia/UTI  - Will follow for changes.    DVT Prophylaxis: xarelto   Code Status: full Family Communication:wife has been included in  conversations Disposition Plan: inpatient until ready to be dc'd to rehab.  Likely 11/4   Consultants:  Urology  Vascular  PT  Infectious disease  Procedures:  Suprapubic catheter removal  Antibiotics:  Zosyn stopped 10/31  Ancef started 10/31  HPI/Subjective: Pt says he feels well today.  He says his suprapubic catheter site is a little tender, but that it is feeling better. He also states that his legs look and feel better.  Objective: Filed Vitals:   06/16/13 0442  BP: 138/70  Pulse: 71  Temp: 98 F (36.7 C)  Resp: 18    Intake/Output Summary (Last 24 hours) at 06/16/13 1247 Last data filed at 06/16/13 0900  Gross per 24 hour  Intake    880 ml  Output   1275 ml  Net   -395 ml   Filed Weights   06/11/13 1654 06/11/13 2351  Weight: 132.904 kg (293 lb) 119.6 kg (263 lb 10.7 oz)    Exam:   General:  wdwn male sitting upright in nad  Cardiovascular: RRR, no m/g/r  Respiratory: clear to ausculation bilat, no inc wob  Abdomen: +BS, nondistended obese, nontender except for area of suprapubic catheter  Musculoskeletal: all 4 extremities full ROM, slight edema in legs, lower extremities notable for bilateral fasciotomy incisions still healing  Data Reviewed: Basic Metabolic Panel:  Recent Labs Lab 06/11/13 1829 06/12/13 0449 06/13/13 0520 06/14/13 0610 06/15/13 0526  NA 138 139 140 139 141  K 4.0 3.8 3.7 3.7 4.0  CL 104 106 108 107 108  CO2 22 25 26 24 26   GLUCOSE 154* 117* 111* 106* 96  BUN 11 11 10 8 8   CREATININE 1.14 1.14 1.10  0.99 1.03  CALCIUM 8.3* 8.1* 8.4 8.4 8.4  MG  --  1.7  --   --   --   PHOS  --  3.8  --   --   --    Liver Function Tests:  Recent Labs Lab 06/11/13 1829 06/12/13 0449  AST 15 10  ALT 8 6  ALKPHOS 64 52  BILITOT 0.5 0.5  PROT 6.6 5.6*  ALBUMIN 2.6* 2.2*   CBC:  Recent Labs Lab 06/11/13 1829 06/12/13 0449 06/13/13 0520 06/14/13 0610 06/15/13 0526  WBC 5.7 4.6 3.6* 4.1 4.1  NEUTROABS 4.0  --    --   --   --   HGB 9.3* 8.0* 8.0* 8.3* 8.6*  HCT 29.8* 25.8* 25.4* 26.2* 27.2*  MCV 89.0 89.3 88.8 87.9 89.2  PLT 201 165 158 195 201   BNP (last 3 results)  Recent Labs  04/18/13 1245 04/27/13 1240  PROBNP 1762.0* 1260.0*     Recent Results (from the past 240 hour(s))  CULTURE, BLOOD (ROUTINE X 2)     Status: None   Collection Time    06/11/13  6:30 PM      Result Value Range Status   Specimen Description BLOOD ARM LEFT   Final   Special Requests BOTTLES DRAWN AEROBIC AND ANAEROBIC 6CC   Final   Culture  Setup Time     Final   Value: 06/12/2013 02:38     Performed at [facility]   Culture     Final   Value: STAPHYLOCOCCUS AUREUS     Note: RIFAMPIN AND GENTAMICIN SHOULD NOT BE USED AS SINGLE DRUGS FOR TREATMENT OF STAPH INFECTIONS.     Note: Gram Stain Report Called to,Read Back By and Verified With: ARIEL MOHAMMED ON 06/13/2013 AT 12:48A BY Serafina Mitchell     Performed at [facility]   Report Status 06/15/2013 FINAL   Final   Organism ID, Bacteria STAPHYLOCOCCUS AUREUS   Final  URINE CULTURE     Status: None   Collection Time    06/11/13  7:27 PM      Result Value Range Status   Specimen Description URINE, RANDOM   Final   Special Requests NONE   Final   Culture  Setup Time     Final   Value: 06/12/2013 03:06     Performed at [facility] Count     Final   Value: >=100,000 COLONIES/ML     Performed at [facility]   Culture     Final   Value: STAPHYLOCOCCUS AUREUS     Note: RIFAMPIN AND GENTAMICIN SHOULD NOT BE USED AS SINGLE DRUGS FOR TREATMENT OF STAPH INFECTIONS.     Performed at [facility]   Report Status 06/16/2013 FINAL   Final   Organism ID, Bacteria STAPHYLOCOCCUS AUREUS   Final  CULTURE, BLOOD (ROUTINE X 2)     Status: None   Collection Time    06/15/13  1:58 AM      Result Value Range Status   Specimen Description BLOOD RIGHT ARM   Final   Special Requests BOTTLES DRAWN AEROBIC AND ANAEROBIC 10CC EA   Final    Culture  Setup Time     Final   Value: 06/15/2013 13:52     Performed at [facility]   Culture     Final   Value:        BLOOD CULTURE RECEIVED NO GROWTH TO DATE CULTURE  WILL BE HELD FOR 5 DAYS BEFORE ISSUING A FINAL NEGATIVE REPORT     Performed at [facility]   Report Status PENDING   Incomplete  CULTURE, BLOOD (ROUTINE X 2)     Status: None   Collection Time    06/15/13  2:03 AM      Result Value Range Status   Specimen Description BLOOD RIGHT HAND   Final   Special Requests BOTTLES DRAWN AEROBIC ONLY 10CC   Final   Culture  Setup Time     Final   Value: 06/15/2013 13:52     Performed at [facility]   Culture     Final   Value:        BLOOD CULTURE RECEIVED NO GROWTH TO DATE CULTURE WILL BE HELD FOR 5 DAYS BEFORE ISSUING A FINAL NEGATIVE REPORT     Performed at [facility]   Report Status PENDING   Incomplete     Studies: No results found.  Scheduled Meds: .  ceFAZolin (ANCEF) IV  2 g Intravenous Q8H  . docusate sodium  100 mg Oral BID  . iron polysaccharides  150 mg Oral Daily  . multivitamin with minerals  1 tablet Oral Daily  . pantoprazole  40 mg Oral Q1200  . Rivaroxaban  15 mg Oral BID WC  . [START ON 06/18/2013] rivaroxaban  20 mg Oral Q supper  . sodium chloride  3 mL Intravenous Q12H   Continuous Infusions:   Principal Problem:   UTI (urinary tract infection) Active Problems:   Chronic renal insufficiency   DVT, bilateral lower limbs   Cellulitis   Anemia   Sepsis(995.91)   MSSA (methicillin susceptible Staphylococcus aureus) septicemia    Dorinda Hill, PA-S  Algis Downs, PA-C [facility] Pager [phone]. If 7PM-7AM, please contact night-coverage at [email or web address], password [facility] 06/16/2013, 12:47 PM  LOS: 5 days      Addendum  Patient seen and examined, chart and data base reviewed.  I agree with the above assessment and plan.  For full details please see Mrs. Algis Downs PA note.   Clint Lipps, MD [facility] Pager: [phone] 06/16/2013, 1:34 PM

## 2013-06-17 DIAGNOSIS — N179 Acute kidney failure, unspecified: Secondary | ICD-10-CM

## 2013-06-17 DIAGNOSIS — Z113 Encounter for screening for infections with a predominantly sexual mode of transmission: Secondary | ICD-10-CM

## 2013-06-17 DIAGNOSIS — D62 Acute posthemorrhagic anemia: Secondary | ICD-10-CM

## 2013-06-17 DIAGNOSIS — Z9889 Other specified postprocedural states: Secondary | ICD-10-CM

## 2013-06-17 DIAGNOSIS — J9600 Acute respiratory failure, unspecified whether with hypoxia or hypercapnia: Secondary | ICD-10-CM

## 2013-06-17 LAB — HEPATITIS PANEL, ACUTE
HCV Ab: NEGATIVE
Hep A IgM: NONREACTIVE
Hep B C IgM: NONREACTIVE

## 2013-06-17 MED ORDER — OXYCODONE HCL 5 MG PO TABS: 5.0000 mg | ORAL_TABLET | Freq: Four times a day (QID) | ORAL | Status: DC | PRN

## 2013-06-17 MED ORDER — ALBUTEROL SULFATE HFA 108 (90 BASE) MCG/ACT IN AERS
2.0000 | INHALATION_SPRAY | Freq: Four times a day (QID) | RESPIRATORY_TRACT | Status: DC
  Administered 2013-06-17: 2 via RESPIRATORY_TRACT
  Filled 2013-06-17: qty 6.7

## 2013-06-17 MED ORDER — HEPARIN SOD (PORK) LOCK FLUSH 100 UNIT/ML IV SOLN
250.0000 [IU] | INTRAVENOUS | Status: AC | PRN
  Administered 2013-06-17: 500 [IU]

## 2013-06-17 MED ORDER — PANTOPRAZOLE SODIUM 40 MG PO TBEC: 40.0000 mg | DELAYED_RELEASE_TABLET | Freq: Every day | ORAL | Status: AC

## 2013-06-17 MED ORDER — ALPRAZOLAM 0.25 MG PO TABS: 0.2500 mg | ORAL_TABLET | Freq: Three times a day (TID) | ORAL | Status: DC | PRN

## 2013-06-17 MED ORDER — DSS 100 MG PO CAPS: 100.0000 mg | ORAL_CAPSULE | Freq: Two times a day (BID) | ORAL | Status: AC

## 2013-06-17 MED ORDER — SODIUM CHLORIDE 0.9 % IJ SOLN
10.0000 mL | INTRAMUSCULAR | Status: DC | PRN
  Administered 2013-06-17: 10 mL

## 2013-06-17 MED ORDER — POLYSACCHARIDE IRON COMPLEX 150 MG PO CAPS: 150.0000 mg | ORAL_CAPSULE | Freq: Every day | ORAL | Status: DC

## 2013-06-17 MED ORDER — ALBUTEROL SULFATE HFA 108 (90 BASE) MCG/ACT IN AERS: 2.0000 | INHALATION_SPRAY | Freq: Four times a day (QID) | RESPIRATORY_TRACT | Status: DC

## 2013-06-17 MED ORDER — ADULT MULTIVITAMIN W/MINERALS CH: 1.0000 | ORAL_TABLET | Freq: Every day | ORAL | Status: DC

## 2013-06-17 MED ORDER — CEFAZOLIN SODIUM-DEXTROSE 2-3 GM-% IV SOLR: 2.0000 g | Freq: Three times a day (TID) | INTRAVENOUS | Status: DC

## 2013-06-17 NOTE — Discharge Summary (Signed)
Physician Discharge Summary  Jack [name] ZOX:[id number] DOB: 07/19/1946 DOA: 06/11/2013  PCP: Simone Curia, MD  Admit date: 06/11/2013 Discharge date: 06/17/2013  Time spent: 60  minutes  Recommendations for Outpatient Follow-up:  1. Follow up on finalized blood cultures from 11/2 to ensure they remained negative.  If they return positive, the patient's PICC line will need to be removed and a peripheral placed until the Bld cultures are negative, and PICC can be replaced. 2. Patient needs urology follow up in 1 week.   3. Patient on IV antibiotic via picc line thru 12/14. 4. Follow up with vascular surgery PRN as needed for bilateral lower extremity fasciotomies. 5. Continue Xeralto for PE / Billateral DVT. 6. Wound care for healing ostomy from suprapubic catheter removal and lower extremity incisions. 7. Check CBC in 1 week for anemia, neutopenia,    Discharge Diagnoses:  Principal Problem:   UTI (urinary tract infection) Active Problems:   Chronic renal insufficiency   DVT, bilateral lower limbs   Cellulitis   Anemia   Sepsis(995.91)   MSSA (methicillin susceptible Staphylococcus aureus) septicemia   Discharge Condition: stable.  Foley cath in place.  Diet recommendation: heart healthy  Filed Weights   06/11/13 1654 06/11/13 2351  Weight: 132.904 kg (293 lb) 119.6 kg (263 lb 10.7 oz)    History of present illness:  67 yo male just released from SNF after CIR stay. 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  Presented to the hospital with SIRS (Systemic Inflammatory Response Syndrome) from MSSA Bacteremia.  Hospital Course:  Sepsis  -Resolved.  -Likely secondary to UTI, bacteremia  -Denies chills today  -Maximum temperature was 103.2 on 10/29 and 5 PM. Afebrile since 10/30.   MSSA bacteremia  -1 of 1 Blood culture showed MSSA, infectious disease consulted on 10/31   -2D echo on 11/3 was normal with no sign of vegetation. -Antibiotics were adjusted by Infectious Disease to cefazolin for 6 weeks starting 11/2 (if the 11/2 cultures remain negative).  -Blood culture repeated on 11/2, negative so far.  Please follow up on the final results of this blood culture. -PICC on 11/4. -Discussed discharge on 11/4 with Dr. Algis Liming.  Given the patient's clinical presentation, he was comfortable with discharge as long as the final cultures are checked.  UTI with suprapubic catheter  -SP catheter removed by urology, drain bag in place to catch ostomy leaks.  Wound care at SNF. -18 french catheter placed in urethra to help patient void  -Still has hematuria, patient is on Xarleto.  -Urine cultures revealed staph.  -Needs follow up with Urology in their office in 1 week.  Cellulitis around suprapubic catheter  -Pt complained of tenderness around SP catheter site on admission.  He no longer has tenderness. -Erythema improving on antibiotic therapy.   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 Imogene Burn consulted and recommendations made  -Wound care consultation completed  -staples removed.  -Areas of eschar noted surrounding incisions. Legs / incision sites looking gradually better.  -Wound care at SNF.  Chronic renal insufficiency  -Creatinine at baseline (1.03)   Anemia (normocytic)  -appears at baseline  -patient has ongoing hematuria and is on Xeralto. -Iron deficient. (Ferritin elevated)  -IV Iron given 10/30  -Oral Iron supplementation started.   Leukopenia  -  WBC at 4.1  - Likely due to acute bacteremia/UTI     DVT Prophylaxis: xarelto  Consultations:  Vascular Surgery  Infectious Disease   Urology  Discharge Exam: Filed Vitals:   06/17/13 0743  BP: 126/69  Pulse: 81  Temp: 98.4 F (36.9 C)  Resp: 18    General:  A&O, NAD, sitting up in bed. Cardiovascular: rrr no m/r/g Respiratory: slight wheeze particularly on the right side.  No accessory muscle movement Abdomen:  Ostomy bag in place.  Erythema beneath ostomy bag, non tender, non distended, +Bs Extremities:  Bilateral LLE.  Healing incision sites, clean and dry.  Small areas of eschar where staples were previously located.  Discharge Instructions      Discharge Orders   Future Appointments Provider Department Dept Phone   06/20/2013 8:30 AM Fransisco Hertz, MD [facility] [phone]   Future Orders Complete By Expires   Diet - low sodium heart healthy  As directed    Increase activity slowly  As directed        Medication List    STOP taking these medications       ferrous sulfate 325 (65 FE) MG tablet     sulfamethoxazole-trimethoprim 800-160 MG per tablet  Commonly known as:  BACTRIM DS      TAKE these medications       acetaminophen 325 MG tablet  Commonly known as:  TYLENOL  Take 650 mg by mouth every 6 (six) hours as needed for pain.     albuterol 108 (90 BASE) MCG/ACT inhaler  Commonly known as:  PROVENTIL HFA;VENTOLIN HFA  Inhale 2 puffs into the lungs every 6 (six) hours.     ALPRAZolam 0.25 MG tablet  Commonly known as:  XANAX  Take 1 tablet (0.25 mg total) by mouth 3 (three) times daily as needed for anxiety.     ceFAZolin 2-3 GM-% Solr  Commonly known as:  ANCEF  Inject 50 mLs (2 g total) into the vein every 8 (eight) hours.     cyanocobalamin 1000 MCG tablet  Take 1 tablet (1,000 mcg total) by mouth daily.     DSS 100 MG Caps  Take 100 mg by mouth 2 (two) times daily.     iron polysaccharides 150 MG capsule  Commonly known as:  NIFEREX  Take 1 capsule (150 mg total) by mouth daily.     multivitamin with minerals Tabs tablet  Take 1 tablet by mouth daily.     oxyCODONE 5 MG immediate release tablet  Commonly known as:  Oxy IR/ROXICODONE  Take 1 tablet (5 mg total) by mouth  every 6 (six) hours as needed.     pantoprazole 40 MG tablet  Commonly known as:  PROTONIX  Take 1 tablet (40 mg total) by mouth daily at 12 noon.     Rivaroxaban 15 MG Tabs tablet  Commonly known as:  XARELTO  Take 1 tablet (15 mg total) by mouth 2 (two) times daily with a meal. 39 more doses. Ends 06/17/2013.     Rivaroxaban 20 MG Tabs tablet  Commonly known as:  XARELTO  Take 1 tablet (20 mg total) by mouth daily with supper. Start first dose on Wed 06/18/13 at 1700. This has to be started after completing the twice daily dose.  Start taking on:  06/18/2013     saccharomyces boulardii 250 MG capsule  Commonly known as:  FLORASTOR  Take 250 mg by mouth 2 (two) times daily. START 10.29.14  Allergies  Allergen Reactions  . Contrast Media [Iodinated Diagnostic Agents]     On 04/29/2013 spoke with patient he had some type of procedure at [facility] and broke out in hives after the exams in 1997, he did not know what of type of study he had.  I called [facility] Radiology department the only type of contrast they used during that time  was [place]   Follow-up Information   Call Anner Crete, MD. (Needs appt in 1 week.   Please call office.   Can see a NP if I am not available. )    Specialty:  Urology   Contact information:   [address] [place] [address] [phone]       Follow up with Nilda Simmer, MD. (Follow up PRN if needed for lower extremity fasciotomies.)    Specialty:  Vascular Surgery   Contact information:   [address] [place] [address] [phone]        The results of significant diagnostics from this hospitalization (including imaging, microbiology, ancillary and laboratory) are listed below for reference.    Significant Diagnostic Studies: Ct Abdomen Pelvis W Wo Contrast  05/24/2013   CLINICAL DATA:  67 year old male with hematuria. Patient with bilateral urinary stents and subcapsular hematoma. Marland Kitchen  EXAM: CT  ABDOMEN AND PELVIS WITHOUT AND WITH CONTRAST  TECHNIQUE: Multidetector CT imaging of the abdomen and pelvis was performed without contrast material in one or both body regions, followed by contrast material(s) and further sections in one or both body regions. The patient was pre-medicated by the clinician due to contrast allergy history.  CONTRAST:  OMNIPAQUE IOHEXOL 300 MG/ML  SOLN  COMPARISON:  05/13/2013 and prior CTs  FINDINGS: Tiny bilateral pleural effusions are noted.  The left subcapsular renal hematoma has decreased in size, now measuring 10 mm in thickness, previously 16 mm.  Bilateral renal atrophy, moderate to severe renal cortical thinning, left renal cyst, nonobstructing left renal calculi and bilateral urinary stents are unchanged. The urinary stents are unchanged in appearance with tips in the bladder and renal collecting systems.  Mild right hydronephrosis is slightly increased since the prior study. On the delayed images, small filling defects within the right renal calices are noted and may represent small clots, sloughed papilla (papillary necrosis, but not identified on the left side), atypical infection or less likely non opaque calculi. Other etiologies are less likely.  Suprapubic catheter and thick walled bladder are unchanged.  An infrarenal IVC filter is again identified.  The liver, spleen, pancreas, gallbladder and adrenal glands are within normal limits.  No free fluid, enlarged lymph nodes, biliary dilatation or abdominal aortic aneurysm noted.  The bowel, bladder and appendix are unremarkable. There is no evidence of bowel obstruction, abscess or pneumoperitoneum.  No acute or suspicious bony abnormalities are identified.  IMPRESSION: Decreased left subcapsular renal hematoma, now measuring 10 mm, previously 16 mm.  Slightly increasing mild right hydronephrosis. Small filling defects within the right renal calices now noted and differential includes small clots, atypical/fungal  infection or less likely non opaque calculi. Uroepithelial neoplasm is considered less likely.  Unchanged bilateral renal atrophy, nonobstructing left renal calculi, bilateral urinary stents and thick-walled bladder.   Electronically Signed   By: Laveda Abbe M.D.   On: 05/24/2013 16:43   Dg Chest 2 View  06/11/2013   CLINICAL DATA:  Fever. Sepsis.  EXAM: CHEST  2 VIEW  COMPARISON:  05/13/2013.  FINDINGS: The heart size and  mediastinal contours are within normal limits. Both lungs are clear. The visualized skeletal structures are unremarkable. Old right-sided rib fractures.  IMPRESSION: No active cardiopulmonary disease.   Electronically Signed   By: Andreas Newport M.D.   On: 06/11/2013 19:57    Microbiology: Recent Results (from the past 240 hour(s))  CULTURE, BLOOD (ROUTINE X 2)     Status: None   Collection Time    06/11/13  6:30 PM      Result Value Range Status   Specimen Description BLOOD ARM LEFT   Final   Special Requests BOTTLES DRAWN AEROBIC AND ANAEROBIC [facility]   Final   Culture  Setup Time     Final   Value: 06/12/2013 02:38     Performed at [facility]   Culture     Final   Value: STAPHYLOCOCCUS AUREUS     Note: RIFAMPIN AND GENTAMICIN SHOULD NOT BE USED AS SINGLE DRUGS FOR TREATMENT OF STAPH INFECTIONS.     Note: Gram Stain Report Called to,Read Back By and Verified With: ARIEL MOHAMMED ON 06/13/2013 AT 12:48A BY Serafina Mitchell     Performed at [facility]   Report Status 06/15/2013 FINAL   Final   Organism ID, Bacteria STAPHYLOCOCCUS AUREUS   Final  URINE CULTURE     Status: None   Collection Time    06/11/13  7:27 PM      Result Value Range Status   Specimen Description URINE, RANDOM   Final   Special Requests NONE   Final   Culture  Setup Time     Final   Value: 06/12/2013 03:06     Performed at [facility] Count     Final   Value: >=100,000 COLONIES/ML     Performed at [facility]   Culture     Final   Value: STAPHYLOCOCCUS AUREUS      Note: RIFAMPIN AND GENTAMICIN SHOULD NOT BE USED AS SINGLE DRUGS FOR TREATMENT OF STAPH INFECTIONS.     Performed at [facility]   Report Status 06/16/2013 FINAL   Final   Organism ID, Bacteria STAPHYLOCOCCUS AUREUS   Final  CULTURE, BLOOD (ROUTINE X 2)     Status: None   Collection Time    06/15/13  1:58 AM      Result Value Range Status   Specimen Description BLOOD RIGHT ARM   Final   Special Requests BOTTLES DRAWN AEROBIC AND ANAEROBIC 10CC EA   Final   Culture  Setup Time     Final   Value: 06/15/2013 13:52     Performed at [facility]   Culture     Final   Value:        BLOOD CULTURE RECEIVED NO GROWTH TO DATE CULTURE WILL BE HELD FOR 5 DAYS BEFORE ISSUING A FINAL NEGATIVE REPORT     Performed at [facility]   Report Status PENDING   Incomplete  CULTURE, BLOOD (ROUTINE X 2)     Status: None   Collection Time    06/15/13  2:03 AM      Result Value Range Status   Specimen Description BLOOD RIGHT HAND   Final   Special Requests BOTTLES DRAWN AEROBIC ONLY 10CC   Final   Culture  Setup Time     Final   Value: 06/15/2013 13:52     Performed at [facility]   Culture     Final   Value:  BLOOD CULTURE RECEIVED NO GROWTH TO DATE CULTURE WILL BE HELD FOR 5 DAYS BEFORE ISSUING A FINAL NEGATIVE REPORT     Performed at [facility]   Report Status PENDING   Incomplete     Labs: Basic Metabolic Panel:  Recent Labs Lab 06/11/13 1829 06/12/13 0449 06/13/13 0520 06/14/13 0610 06/15/13 0526  NA 138 139 140 139 141  K 4.0 3.8 3.7 3.7 4.0  CL 104 106 108 107 108  CO2 22 25 26 24 26   GLUCOSE 154* 117* 111* 106* 96  BUN 11 11 10 8 8   CREATININE 1.14 1.14 1.10 0.99 1.03  CALCIUM 8.3* 8.1* 8.4 8.4 8.4  MG  --  1.7  --   --   --   PHOS  --  3.8  --   --   --    Liver Function Tests:  Recent Labs Lab 06/11/13 1829 06/12/13 0449  AST 15 10  ALT 8 6  ALKPHOS 64 52  BILITOT 0.5 0.5  PROT 6.6 5.6*  ALBUMIN 2.6* 2.2*    CBC:  Recent Labs Lab 06/11/13 1829 06/12/13 0449 06/13/13 0520 06/14/13 0610 06/15/13 0526  WBC 5.7 4.6 3.6* 4.1 4.1  NEUTROABS 4.0  --   --   --   --   HGB 9.3* 8.0* 8.0* 8.3* 8.6*  HCT 29.8* 25.8* 25.4* 26.2* 27.2*  MCV 89.0 89.3 88.8 87.9 89.2  PLT 201 165 158 195 201   BNP: BNP (last 3 results)  Recent Labs  04/18/13 1245 04/27/13 1240  PROBNP 1762.0* 1260.0*       SignedConley Canal [phone] [facility] 06/17/2013, 10:57 AM

## 2013-06-17 NOTE — Discharge Summary (Signed)
Addendum  Patient seen and examined, chart and data base reviewed.  I agree with the above assessment and plan.  For full details please see Mrs. Algis Downs PA note.  MSSA bacteremia, sepsis and UTI.  Follow up with urology to determine the removal of the Foley catheter.   Clint Lipps, MD [facility] Pager: [phone] 06/17/2013, 11:07 AM

## 2013-06-17 NOTE — Progress Notes (Signed)
Peripherally Inserted Central Catheter/Midline Placement  The IV Nurse has discussed with the patient and/or persons authorized to consent for the patient, the purpose of this procedure and the potential benefits and risks involved with this procedure.  The benefits include less needle sticks, lab draws from the catheter and patient may be discharged home with the catheter.  Risks include, but not limited to, infection, bleeding, blood clot (thrombus formation), and puncture of an artery; nerve damage and irregular heat beat.  Alternatives to this procedure were also discussed.  PICC/Midline Placement Documentation        Stacie Glaze Horton 06/17/2013, 8:59 AM

## 2013-06-17 NOTE — Clinical Social Work Note (Signed)
Per MD patient ready for DC back to [facility]. Admission coordinator Fidelity Sink states that facility is ready for patient to return. Patient wants wife to transport him to facility. Patient's packet sealed and left with patient. RN notified of DC and given number for report. CSW signing off.   Roddie Mc, [place], [employer], [phone]

## 2013-06-17 NOTE — Progress Notes (Signed)
Jack [name] to be D/C'd Skilled nursing facility per MD order.  Discussed with the patient and all questions fully answered.    Medication List    STOP taking these medications       ferrous sulfate 325 (65 FE) MG tablet     sulfamethoxazole-trimethoprim 800-160 MG per tablet  Commonly known as:  BACTRIM DS      TAKE these medications       acetaminophen 325 MG tablet  Commonly known as:  TYLENOL  Take 650 mg by mouth every 6 (six) hours as needed for pain.     albuterol 108 (90 BASE) MCG/ACT inhaler  Commonly known as:  PROVENTIL HFA;VENTOLIN HFA  Inhale 2 puffs into the lungs every 6 (six) hours.     ALPRAZolam 0.25 MG tablet  Commonly known as:  XANAX  Take 1 tablet (0.25 mg total) by mouth 3 (three) times daily as needed for anxiety.     ceFAZolin 2-3 GM-% Solr  Commonly known as:  ANCEF  Inject 50 mLs (2 g total) into the vein every 8 (eight) hours.     cyanocobalamin 1000 MCG tablet  Take 1 tablet (1,000 mcg total) by mouth daily.     DSS 100 MG Caps  Take 100 mg by mouth 2 (two) times daily.     iron polysaccharides 150 MG capsule  Commonly known as:  NIFEREX  Take 1 capsule (150 mg total) by mouth daily.     multivitamin with minerals Tabs tablet  Take 1 tablet by mouth daily.     oxyCODONE 5 MG immediate release tablet  Commonly known as:  Oxy IR/ROXICODONE  Take 1 tablet (5 mg total) by mouth every 6 (six) hours as needed.     pantoprazole 40 MG tablet  Commonly known as:  PROTONIX  Take 1 tablet (40 mg total) by mouth daily at 12 noon.     Rivaroxaban 15 MG Tabs tablet  Commonly known as:  XARELTO  Take 1 tablet (15 mg total) by mouth 2 (two) times daily with a meal. 39 more doses. Ends 06/17/2013.     Rivaroxaban 20 MG Tabs tablet  Commonly known as:  XARELTO  Take 1 tablet (20 mg total) by mouth daily with supper. Start first dose on Wed 06/18/13 at 1700. This has to be started after completing the twice daily dose.  Start taking on:  06/18/2013     saccharomyces boulardii 250 MG capsule  Commonly known as:  FLORASTOR  Take 250 mg by mouth 2 (two) times daily. START 10.29.14        VVS, Skin clean, dry and intact without evidence of skin break down, no evidence of skin tears noted. IV catheter discontinued intact. Site without signs and symptoms of complications. Dressing and pressure applied.  An After Visit Summary was printed and given to the patient. Patient escorted via stretcher, and D/C to [facility] via [place].  Kennyth Arnold D 06/17/2013 11:54 AM

## 2013-06-17 NOTE — Progress Notes (Signed)
[facility]   Day # 5 cefazolin  Subjective: No new complaints   Antibiotics:  Anti-infectives   Start     Dose/Rate Route Frequency Ordered Stop   06/17/13 0000  ceFAZolin (ANCEF) 2-3 GM-% SOLR     2 g 100 mL/hr over 30 Minutes Intravenous Every 8 hours 06/17/13 1032     06/13/13 1600  ceFAZolin (ANCEF) IVPB 2 g/50 mL premix  Status:  Discontinued     2 g 100 mL/hr over 30 Minutes Intravenous 3 times per day 06/13/13 1507 06/17/13 1744   06/12/13 0800  vancomycin (VANCOCIN) 1,250 mg in sodium chloride 0.9 % 250 mL IVPB  Status:  Discontinued     1,250 mg 166.7 mL/hr over 90 Minutes Intravenous Every 12 hours 06/11/13 1947 06/15/13 0916   06/12/13 0200  piperacillin-tazobactam (ZOSYN) IVPB 3.375 g  Status:  Discontinued     3.375 g 12.5 mL/hr over 240 Minutes Intravenous Every 8 hours 06/11/13 1947 06/11/13 2341   06/12/13 0100  piperacillin-tazobactam (ZOSYN) IVPB 3.375 g  Status:  Discontinued     3.375 g 12.5 mL/hr over 240 Minutes Intravenous 3 times per day 06/11/13 2341 06/13/13 1446   06/11/13 2015  vancomycin (VANCOCIN) 2,500 mg in sodium chloride 0.9 % 500 mL IVPB     2,500 mg 250 mL/hr over 120 Minutes Intravenous  Once 06/11/13 2001 06/11/13 2232   06/11/13 2000  vancomycin (VANCOCIN) 2,500 mg in sodium chloride 0.9 % 500 mL IVPB  Status:  Discontinued     2,500 mg 250 mL/hr over 120 Minutes Intravenous  Once 06/11/13 1947 06/11/13 1951   06/11/13 2000  vancomycin (VANCOCIN) 1,500 mg in sodium chloride 0.9 % 500 mL IVPB  Status:  Discontinued     1,500 mg 250 mL/hr over 120 Minutes Intravenous  Once 06/11/13 1951 06/11/13 2001   06/11/13 1915  piperacillin-tazobactam (ZOSYN) IVPB 3.375 g     3.375 g 100 mL/hr over 30 Minutes Intravenous  Once 06/11/13 1901 06/11/13 2030   06/11/13 1915  vancomycin (VANCOCIN) IVPB 1000 mg/200 mL premix  Status:  Discontinued     1,000 mg 200 mL/hr over 60 Minutes Intravenous  Once 06/11/13 1901  06/11/13 1944      Medications: Scheduled Meds:  Continuous Infusions: PRN Meds:.     Objective: Weight change:   Intake/Output Summary (Last 24 hours) at 06/17/13 1825 Last data filed at 06/17/13 1029  Gross per 24 hour  Intake    516 ml  Output   2200 ml  Net  -1684 ml   Blood pressure 138/74, pulse 89, temperature 99.1 F (37.3 C), temperature source Oral, resp. rate 18, height 6' (1.829 m), weight 263 lb 10.7 oz (119.6 kg), SpO2 96.00%. Temp:  [97.9 F (36.6 C)-99.1 F (37.3 C)] 99.1 F (37.3 C) (11/04 1401) Pulse Rate:  [80-89] 89 (11/04 1401) Resp:  [18-21] 18 (11/04 1401) BP: (126-138)/(69-74) 138/74 mmHg (11/04 1401) SpO2:  [96 %-99 %] 96 % (11/04 1401)  Physical Exam: General: Alert and awake, oriented x3, not in any acute distress. HEENT: EOMI CVS regular rate, normal r,  no murmur rubs or gallops Chest: clear to auscultation bilaterally, no wheezing, rales or rhonchi Abdomen: suprapubic site with dressing,  Neuro: nonfocal  Lab Results:  Recent Labs  06/15/13 0526  WBC 4.1  HGB 8.6*  HCT 27.2*  PLT 201    BMET  Recent Labs  06/15/13 0526  NA 141  K 4.0  CL  108  CO2 26  GLUCOSE 96  BUN 8  CREATININE 1.03  CALCIUM 8.4    Micro Results: Recent Results (from the past 240 hour(s))  CULTURE, BLOOD (ROUTINE X 2)     Status: None   Collection Time    06/11/13  6:30 PM      Result Value Range Status   Specimen Description BLOOD ARM LEFT   Final   Special Requests BOTTLES DRAWN AEROBIC AND ANAEROBIC [facility]   Final   Culture  Setup Time     Final   Value: 06/12/2013 02:38     Performed at [facility]   Culture     Final   Value: STAPHYLOCOCCUS AUREUS     Note: RIFAMPIN AND GENTAMICIN SHOULD NOT BE USED AS SINGLE DRUGS FOR TREATMENT OF STAPH INFECTIONS.     Note: Gram Stain Report Called to,Read Back By and Verified With: ARIEL MOHAMMED ON 06/13/2013 AT 12:48A BY Serafina Mitchell     Performed at [facility]   Report Status  06/15/2013 FINAL   Final   Organism ID, Bacteria STAPHYLOCOCCUS AUREUS   Final  URINE CULTURE     Status: None   Collection Time    06/11/13  7:27 PM      Result Value Range Status   Specimen Description URINE, RANDOM   Final   Special Requests NONE   Final   Culture  Setup Time     Final   Value: 06/12/2013 03:06     Performed at [facility] Count     Final   Value: >=100,000 COLONIES/ML     Performed at [facility]   Culture     Final   Value: STAPHYLOCOCCUS AUREUS     Note: RIFAMPIN AND GENTAMICIN SHOULD NOT BE USED AS SINGLE DRUGS FOR TREATMENT OF STAPH INFECTIONS.     Performed at [facility]   Report Status 06/16/2013 FINAL   Final   Organism ID, Bacteria STAPHYLOCOCCUS AUREUS   Final  CULTURE, BLOOD (ROUTINE X 2)     Status: None   Collection Time    06/15/13  1:58 AM      Result Value Range Status   Specimen Description BLOOD RIGHT ARM   Final   Special Requests BOTTLES DRAWN AEROBIC AND ANAEROBIC 10CC EA   Final   Culture  Setup Time     Final   Value: 06/15/2013 13:52     Performed at [facility]   Culture     Final   Value:        BLOOD CULTURE RECEIVED NO GROWTH TO DATE CULTURE WILL BE HELD FOR 5 DAYS BEFORE ISSUING A FINAL NEGATIVE REPORT     Performed at [facility]   Report Status PENDING   Incomplete  CULTURE, BLOOD (ROUTINE X 2)     Status: None   Collection Time    06/15/13  2:03 AM      Result Value Range Status   Specimen Description BLOOD RIGHT HAND   Final   Special Requests BOTTLES DRAWN AEROBIC ONLY 10CC   Final   Culture  Setup Time     Final   Value: 06/15/2013 13:52     Performed at [facility]   Culture     Final   Value:        BLOOD CULTURE RECEIVED NO GROWTH TO DATE CULTURE WILL BE HELD FOR 5 DAYS BEFORE ISSUING A FINAL NEGATIVE  REPORT     Performed at [facility]   Report Status PENDING   Incomplete    Studies/Results: No results found.    Assessment/Plan: Jack [name] is a 67 y.o. male with  MSSA bacteremia after TURP. TTE supoptimal images. Suprapubic out yesterday, foley in today   #1 MSSA bacteremia: with MSSA in urine in case where this may indeed have originated in urine. Regardless should have protracted IV abx therapy.   --I would give him 6 weeks of IV ancef with day #1 being 06/15/13 IF cultures from that date indeed remain negative.  --I was OK with placing central line today which was not yet at 72 hour mark. If he grows MSSA on repeat cultures will have to bring back, dc picc and begin documenting clearance over again  #2 Screening: HIV negative, hep panel negative    LOS: 6 days   Acey Lav 06/17/2013, 6:25 PM

## 2013-06-20 ENCOUNTER — Encounter: Payer: PRIVATE HEALTH INSURANCE | Admitting: Vascular Surgery

## 2013-06-21 LAB — CULTURE, BLOOD (ROUTINE X 2): Culture: NO GROWTH

## 2013-07-02 ENCOUNTER — Inpatient Hospital Stay: Payer: PRIVATE HEALTH INSURANCE | Admitting: Infectious Diseases

## 2013-07-03 ENCOUNTER — Encounter: Payer: Self-pay | Admitting: Vascular Surgery

## 2013-07-04 ENCOUNTER — Ambulatory Visit (INDEPENDENT_AMBULATORY_CARE_PROVIDER_SITE_OTHER): Payer: Self-pay | Admitting: Vascular Surgery

## 2013-07-04 ENCOUNTER — Inpatient Hospital Stay: Payer: PRIVATE HEALTH INSURANCE | Admitting: Infectious Diseases

## 2013-07-04 ENCOUNTER — Encounter: Payer: Self-pay | Admitting: Vascular Surgery

## 2013-07-04 VITALS — BP 132/65 | HR 96 | Ht 72.0 in | Wt 267.0 lb

## 2013-07-04 DIAGNOSIS — I8290 Acute embolism and thrombosis of unspecified vein: Secondary | ICD-10-CM | POA: Insufficient documentation

## 2013-07-04 DIAGNOSIS — I82403 Acute embolism and thrombosis of unspecified deep veins of lower extremity, bilateral: Secondary | ICD-10-CM

## 2013-07-04 DIAGNOSIS — Z5189 Encounter for other specified aftercare: Secondary | ICD-10-CM

## 2013-07-04 DIAGNOSIS — T79A29A Traumatic compartment syndrome of unspecified lower extremity, initial encounter: Secondary | ICD-10-CM | POA: Insufficient documentation

## 2013-07-04 DIAGNOSIS — T79A29D Traumatic compartment syndrome of unspecified lower extremity, subsequent encounter: Secondary | ICD-10-CM

## 2013-07-04 DIAGNOSIS — I82409 Acute embolism and thrombosis of unspecified deep veins of unspecified lower extremity: Secondary | ICD-10-CM

## 2013-07-04 NOTE — Progress Notes (Signed)
   Postoperative Visit   History of Present Illness  Jack [name] is a 67 y.o. year old male who presents for postoperative follow-up for:  1. RLE fasciotomy (05/14/13) with closure (05/19/13) 2. LLE fasciotomy (05/17/13) with closure (05/21/13)  The patient's wounds are nearly healed.  The patient is currently in a rehab facility.  For VQI Use Only  PRE-ADM LIVING: Home  AMB STATUS: Wheelchair  Physical Examination  Filed Vitals:   07/04/13 0936  BP: 132/65  Pulse: 96   BLE: lateral fasciotomy incision closed and healed, 2-3 cm circular ulcer in mid-incision of B medial fasciotomy incisions, shallow (~1-2 cm) with some ischemic skin in R one, no granulation tissue in either leg  Medical Decision Making  Jack [name] is a 67 y.o. year old male who presents s/p BLE fasciotomies and closure.  The patient's bypass incisions are healing appropriately with resolution of pre-operative symptoms.  Santyl BID to both leg ulcers with wet-to-dry dressing to both ulcers.    Wound clinic referral to speed up recovery. It continues to be unclear to be why this patient developed compartment syndrome in both legs, but he is recovering nicely at this point. Hopefully, with continue rehab. He will recovery full ambulation. Follow up in 4 weeks for wound check.  Leonides Sake, MD [facility] Office: [phone] Pager: [phone]  07/04/2013, 9:55 AM

## 2013-07-17 ENCOUNTER — Ambulatory Visit (INDEPENDENT_AMBULATORY_CARE_PROVIDER_SITE_OTHER): Payer: PRIVATE HEALTH INSURANCE | Admitting: Infectious Disease

## 2013-07-17 ENCOUNTER — Encounter: Payer: Self-pay | Admitting: Infectious Disease

## 2013-07-17 VITALS — BP 137/75 | HR 85 | Temp 98.2°F | Wt 270.0 lb

## 2013-07-17 DIAGNOSIS — A4101 Sepsis due to Methicillin susceptible Staphylococcus aureus: Secondary | ICD-10-CM

## 2013-07-17 DIAGNOSIS — Z9889 Other specified postprocedural states: Secondary | ICD-10-CM

## 2013-07-17 DIAGNOSIS — N390 Urinary tract infection, site not specified: Secondary | ICD-10-CM

## 2013-07-17 DIAGNOSIS — C61 Malignant neoplasm of prostate: Secondary | ICD-10-CM

## 2013-07-17 DIAGNOSIS — A419 Sepsis, unspecified organism: Secondary | ICD-10-CM

## 2013-07-17 NOTE — Progress Notes (Signed)
Patient ID: Jack [name], male   DOB: October 08, 1945, 67 y.o.   MRN: [id number] PATIENT HAS BEEN ON COMPLETELY WRONG DOSE OF ANCEF FOR BLOOD STREAM INFECTION HE IS ONLY ON 500MG  EVERY 8 HOURS  HE NEEDS TO CHANGE OVER TO 2 GRAMS IV EVERY EIGHT HOURS AND COMPLETE A MINIMUM OF 4 IF NOT SIX WEEKS FROM TODAY BEING DAY #1  WE WILL NOTIFY HIS SNF. I DO NOT KNOW HOW THIS HAPPENED  WE ARE NOT TREATING A UTI, WE ARE TREATING A BLOOD STREAM INFECTION

## 2013-07-17 NOTE — Progress Notes (Signed)
   Subjective:    Patient ID: Jack [name], male    DOB: 05-22-1946, 67 y.o.   MRN: [id number]  HPI  Jack [name] is a 67 y.o. male with MSSA bacteremia after TURP. TTE supoptimal images. Suprapubic out , foley in  Plan was to  give him 6 weeks of IV ancef with day #1 being 06/15/13   He is doing well has had stents placed. He is without fevers, chills, nausea. He has no complaints      Review of Systems  Constitutional: Negative for fever, chills, diaphoresis, activity change, appetite change, fatigue and unexpected weight change.  HENT: Negative for congestion, rhinorrhea, sinus pressure, sneezing, sore throat and trouble swallowing.   Eyes: Negative for photophobia and visual disturbance.  Respiratory: Negative for cough, chest tightness, shortness of breath, wheezing and stridor.   Cardiovascular: Negative for chest pain, palpitations and leg swelling.  Gastrointestinal: Negative for nausea, vomiting, abdominal pain, diarrhea, constipation, blood in stool, abdominal distention and anal bleeding.  Genitourinary: Negative for flank pain.  Musculoskeletal: Negative for arthralgias, back pain, gait problem, joint swelling and myalgias.  Skin: Negative for color change, pallor, rash and wound.  Neurological: Negative for dizziness, tremors, weakness and light-headedness.  Hematological: Negative for adenopathy. Does not bruise/bleed easily.  Psychiatric/Behavioral: Negative for behavioral problems, confusion, sleep disturbance, dysphoric mood, decreased concentration and agitation.       Objective:   Physical Exam  Constitutional: He is oriented to person, place, and time. He appears well-developed and well-nourished. No distress.  HENT:  Head: Normocephalic and atraumatic.  Mouth/Throat: Oropharynx is clear and moist. No oropharyngeal exudate.  Eyes: Conjunctivae and EOM are normal. No scleral icterus.  Neck: Normal range of motion. Neck supple. No JVD present.  Cardiovascular:  Normal rate and regular rhythm.   Pulmonary/Chest: Effort normal. No respiratory distress. He has no wheezes.  Abdominal: Soft. He exhibits no distension.  Lymphadenopathy:    He has no cervical adenopathy.  Neurological: He is alert and oriented to person, place, and time. He exhibits normal muscle tone. Coordination normal.  Skin: Skin is warm and dry. He is not diaphoretic.     Psychiatric: He has a normal mood and affect. His behavior is normal. Judgment and thought content normal.          Assessment & Plan:   #1 MSSA bacteremia and MRSA in urine, sp removal of suprapubic catheter in pt sp TURP --finish IV abx (ancef) thru December 18th, then rtc in early January for surveillance cultures  #2Fasciotomy sites: were not examined I was not aware of their existence until after he had been dc from clinic

## 2013-07-17 NOTE — Progress Notes (Signed)
Patient ID: Jack [name], male   DOB: [birth year], 67 y.o.   MRN: [id number]  i ALSO DID EXAMINE HIS FASCIOTOMY SITES AND CHANGED THEM THEY ARE CLEAN WITH GRANULATION TISSUE. THESE cOULD HAVE BEEN MORE LIKELY SOURCE OF HIS MSSA BACTEREMIA RATHER THAN URINE  AGAIN ABX NEED TO BE CHANGED AT [facility] TO ANCEF 2 GRAMS IV Q 8 HOURS FOR 4 ADDITIONAL WEEKS SINCE DOSE WAS WRONG  PHONE NUMBER OF CENTER IS [phone]  LETS CALL CENTER TO MAKE SURE THIS IS CORRECTED

## 2013-07-31 ENCOUNTER — Ambulatory Visit
Admission: RE | Admit: 2013-07-31 | Discharge: 2013-07-31 | Disposition: A | Discharge status: Home / Self Care | Payer: PRIVATE HEALTH INSURANCE | Source: Ambulatory Visit | Attending: Radiology | Admitting: Radiology

## 2013-07-31 ENCOUNTER — Ambulatory Visit
Admission: RE | Admit: 2013-07-31 | Discharge: 2013-07-31 | Disposition: A | Discharge status: Home / Self Care | Payer: PRIVATE HEALTH INSURANCE | Source: Ambulatory Visit | Attending: Interventional Radiology | Admitting: Interventional Radiology

## 2013-07-31 DIAGNOSIS — I82220 Acute embolism and thrombosis of inferior vena cava: Secondary | ICD-10-CM

## 2013-07-31 DIAGNOSIS — I82403 Acute embolism and thrombosis of unspecified deep veins of lower extremity, bilateral: Secondary | ICD-10-CM

## 2013-07-31 NOTE — Progress Notes (Signed)
Patient states that he was discharged from skilled nursing facility on 07/29/2013.  Currently on Xarelto.  PICC line Right arm for antibiotic administration (per patient, this was placed for Rx of "blood infection").  States he was told that he will most likely receive antibiotics for an additional 3 weeks.     Receiving wound care at a clinic in [place] for lower extremities.  Patient is in good spirits.    -Amado Nash, RN 07/31/2013 12:57 PM

## 2013-08-06 ENCOUNTER — Encounter: Payer: Self-pay | Admitting: Vascular Surgery

## 2013-08-08 ENCOUNTER — Encounter: Payer: Self-pay | Admitting: Vascular Surgery

## 2013-08-08 ENCOUNTER — Ambulatory Visit (INDEPENDENT_AMBULATORY_CARE_PROVIDER_SITE_OTHER): Payer: Self-pay | Admitting: Vascular Surgery

## 2013-08-08 VITALS — BP 136/78 | HR 84 | Resp 16 | Ht 71.0 in | Wt 267.0 lb

## 2013-08-08 DIAGNOSIS — I82409 Acute embolism and thrombosis of unspecified deep veins of unspecified lower extremity: Secondary | ICD-10-CM

## 2013-08-08 DIAGNOSIS — T79A29D Traumatic compartment syndrome of unspecified lower extremity, subsequent encounter: Secondary | ICD-10-CM

## 2013-08-08 DIAGNOSIS — I82403 Acute embolism and thrombosis of unspecified deep veins of lower extremity, bilateral: Secondary | ICD-10-CM

## 2013-08-08 MED ORDER — COLLAGENASE 250 UNIT/GM EX OINT: 1.0000 "application " | TOPICAL_OINTMENT | Freq: Every day | CUTANEOUS | Status: AC

## 2013-08-08 NOTE — Progress Notes (Signed)
   Postoperative Visit   History of Present Illness  Jack [name] is a 67 y.o. year old male who presents for postoperative follow-up for:  1. RLE fasciotomy (05/14/13) with closure (05/19/13) 2. LLE fasciotomy (05/17/13) with closure (05/21/13)  The patient's wounds are nearly healed. The patient has been discharged from his rehab facility.  For VQI Use Only  PRE-ADM LIVING: Home  AMB STATUS: Wheelchair   Physical Examination  Filed Vitals:   08/08/13 1028  BP: 136/78  Pulse: 84  Resp: 16  Height: 5\' 11"  (1.803 m)  Weight: 267 lb (121.11 kg)  SpO2: 97%   BLE: lateral fasciotomy incision closed and healed, 1.5 cm circular ulcer in mid-incision of B medial fasciotomy incisions, shallow (~0.5 cm) , good granulation in base of both ulcers, B leg edema 2+  Medical Decision Making  Jack [name] is a 67 y.o. year old male who presents s/p BLE fasciotomies and closure.  I would add compression with ACE bandages to both feet. The patient's incisions are healing appropriately with resolution of pre-operative symptoms.  Santyl BID to both leg ulcers with wet-to-dry dressing to both ulcers BID. Patient can follow up.  Leonides Sake, MD [facility] Office: [phone] Pager: [phone]  08/08/2013, 11:42 AM

## 2013-08-20 ENCOUNTER — Encounter: Payer: Self-pay | Admitting: Infectious Disease

## 2013-08-20 ENCOUNTER — Ambulatory Visit (INDEPENDENT_AMBULATORY_CARE_PROVIDER_SITE_OTHER): Payer: PRIVATE HEALTH INSURANCE | Admitting: Infectious Disease

## 2013-08-20 VITALS — BP 162/71 | HR 83 | Temp 97.7°F | Wt 285.0 lb

## 2013-08-20 DIAGNOSIS — T80219A Unspecified infection due to central venous catheter, initial encounter: Secondary | ICD-10-CM

## 2013-08-20 DIAGNOSIS — Z9889 Other specified postprocedural states: Secondary | ICD-10-CM

## 2013-08-20 DIAGNOSIS — A419 Sepsis, unspecified organism: Secondary | ICD-10-CM

## 2013-08-20 DIAGNOSIS — T80218A Other infection due to central venous catheter, initial encounter: Secondary | ICD-10-CM

## 2013-08-20 DIAGNOSIS — A4101 Sepsis due to Methicillin susceptible Staphylococcus aureus: Secondary | ICD-10-CM

## 2013-08-20 MED ORDER — CEPHALEXIN 500 MG PO CAPS: 1000.0000 mg | ORAL_CAPSULE | Freq: Two times a day (BID) | ORAL | Status: AC

## 2013-08-20 NOTE — Progress Notes (Signed)
   Subjective:    Patient ID: Jack [name], male    DOB: 1945-11-12, 68 y.o.   MRN: [id number]  HPI   Jack [name] is a 68 y.o. male with MSSA bacteremia after bilateral fasciotomies,  TURP.   TTE supoptimal images. Suprapubic out , foley in  Plan was to  give him 6 weeks of IV ancef with day #1 being 06/15/13 , unfortunately he was COMPLETELY under-dosed on the IV abx and we had to restart on high dose ancef at 2g iv q 8 hours and pt is now 5 weeks into therapy.    He is without fevers, chills, nausea, but does have purulent drainage from PICC line     Review of Systems  Constitutional: Negative for fever, chills, diaphoresis, activity change, appetite change, fatigue and unexpected weight change.  HENT: Negative for congestion, rhinorrhea, sinus pressure, sneezing, sore throat and trouble swallowing.   Eyes: Negative for photophobia and visual disturbance.  Respiratory: Negative for cough, chest tightness, shortness of breath, wheezing and stridor.   Cardiovascular: Negative for chest pain, palpitations and leg swelling.  Gastrointestinal: Negative for nausea, vomiting, abdominal pain, diarrhea, constipation, blood in stool, abdominal distention and anal bleeding.  Genitourinary: Negative for flank pain.  Musculoskeletal: Negative for arthralgias, back pain, gait problem, joint swelling and myalgias.  Skin: Positive for color change and wound. Negative for pallor and rash.  Neurological: Negative for dizziness, tremors, weakness and light-headedness.  Hematological: Negative for adenopathy. Does not bruise/bleed easily.  Psychiatric/Behavioral: Negative for behavioral problems, confusion, sleep disturbance, dysphoric mood, decreased concentration and agitation.       Objective:   Physical Exam  Constitutional: He is oriented to person, place, and time. He appears well-developed and well-nourished. No distress.  HENT:  Head: Normocephalic and atraumatic.  Mouth/Throat: Oropharynx  is clear and moist. No oropharyngeal exudate.  Eyes: Conjunctivae and EOM are normal. No scleral icterus.  Neck: Normal range of motion. Neck supple. No JVD present.  Cardiovascular: Normal rate and regular rhythm.   Pulmonary/Chest: Effort normal. No respiratory distress. He has no wheezes.  Abdominal: Soft. He exhibits no distension.  Lymphadenopathy:    He has no cervical adenopathy.  Neurological: He is alert and oriented to person, place, and time. He exhibits normal muscle tone. Coordination normal.  Skin: Skin is warm and dry. He is not diaphoretic.     Psychiatric: He has a normal mood and affect. His behavior is normal. Judgment and thought content normal.                Assessment & Plan:   #1 MSSA bacteremia and MRSA in urine, sp faciotomies and sp removal of suprapubic catheter in pt sp TURP --he is NOW FIVE weeks into repeat course at proper dose of ancef --dc PICC and DC ancef --finish another 2 weeks of oral keflex 1g po bid  I spent greater than 25 minutes with the patient including greater than 50% of time in face to face counsel of the patient and in coordination of their care.   #2Fasciotomy sites: appear to be healing well  #3 PICC line with purulence: dc PICC line, keflex as above  RTC in 6 weeks.

## 2013-08-20 NOTE — Progress Notes (Signed)
RN received order to discontinue the patient's PICC line.  Patient identified with name and date of birth. PICC dressing removed.  Skin surrounding PICC site erythematous with some blistering.  PICC line removed using sterile procedure @ 1010. PICC length equal to that noted in patient's hospital chart of 45 cm. Sterile petroleum gauze + sterile 4X4 applied to PICC site, pressure applied for 10 minutes and covered with Medipore tape as a pressure dressing. Patient tolerated procedure without complaints.  Patient instructed to limit use of arm for 1 hour. Patient instructed that the pressure dressing should remain in place for 24 hours.  Pt advised to carefully bathe skin surrounding PICC site due to erythema and small blisters after removing pressure dressing.  Pt may apply petroleum jelly to area to decrease friction with clothing.   Patient verbalized understanding of these instructions

## 2013-08-26 ENCOUNTER — Telehealth: Payer: Self-pay | Admitting: Radiology

## 2013-08-26 ENCOUNTER — Other Ambulatory Visit (HOSPITAL_COMMUNITY): Payer: Self-pay | Admitting: Interventional Radiology

## 2013-08-26 DIAGNOSIS — I82409 Acute embolism and thrombosis of unspecified deep veins of unspecified lower extremity: Secondary | ICD-10-CM

## 2013-08-26 NOTE — Telephone Encounter (Signed)
1010  Patient states that he stopped Xarelto upon discharge from nursing care facility (approx date 08/01/2013).  States that he has changed his primary care physician to Dr Heber Perth in [facility].  He has a follow up appointment with on 2/6 or 09/22/2013.    Continues to experience bilateral lower extremity edema and numbness of lower extremities.  Notices some improvement when he is able to walk, but has not been walking regularly due to cold temperatures.    Last follow up at wound care clinic in [place] on 08/25/2013.  Next appointment in 2 weeks.  Legs are almost healed.  Has not been wearing compression hose.    Reviewed above phone call with Dr Laurence Ferrari.  Patient to be set up for IVC filter retrieval.  Reece Levy, RN 08/26/2013 10:57 AM

## 2013-08-28 ENCOUNTER — Other Ambulatory Visit (HOSPITAL_COMMUNITY): Payer: Self-pay | Admitting: Radiology

## 2013-08-28 ENCOUNTER — Other Ambulatory Visit: Payer: Self-pay | Admitting: Radiology

## 2013-08-29 ENCOUNTER — Encounter (HOSPITAL_COMMUNITY): Payer: Self-pay | Admitting: Pharmacy Technician

## 2013-09-01 ENCOUNTER — Encounter (HOSPITAL_COMMUNITY): Payer: Self-pay

## 2013-09-01 ENCOUNTER — Ambulatory Visit (HOSPITAL_COMMUNITY)
Admission: RE | Admit: 2013-09-01 | Discharge: 2013-09-01 | Disposition: A | Discharge status: Home / Self Care | Payer: PRIVATE HEALTH INSURANCE | Source: Ambulatory Visit | Attending: Interventional Radiology | Admitting: Interventional Radiology

## 2013-09-01 DIAGNOSIS — I82409 Acute embolism and thrombosis of unspecified deep veins of unspecified lower extremity: Secondary | ICD-10-CM

## 2013-09-01 DIAGNOSIS — J45909 Unspecified asthma, uncomplicated: Secondary | ICD-10-CM | POA: Insufficient documentation

## 2013-09-01 DIAGNOSIS — Z86718 Personal history of other venous thrombosis and embolism: Secondary | ICD-10-CM | POA: Insufficient documentation

## 2013-09-01 DIAGNOSIS — Z86711 Personal history of pulmonary embolism: Secondary | ICD-10-CM | POA: Insufficient documentation

## 2013-09-01 DIAGNOSIS — I129 Hypertensive chronic kidney disease with stage 1 through stage 4 chronic kidney disease, or unspecified chronic kidney disease: Secondary | ICD-10-CM | POA: Insufficient documentation

## 2013-09-01 DIAGNOSIS — Z85038 Personal history of other malignant neoplasm of large intestine: Secondary | ICD-10-CM | POA: Insufficient documentation

## 2013-09-01 DIAGNOSIS — N189 Chronic kidney disease, unspecified: Secondary | ICD-10-CM | POA: Insufficient documentation

## 2013-09-01 DIAGNOSIS — F17200 Nicotine dependence, unspecified, uncomplicated: Secondary | ICD-10-CM | POA: Insufficient documentation

## 2013-09-01 DIAGNOSIS — Z9049 Acquired absence of other specified parts of digestive tract: Secondary | ICD-10-CM | POA: Insufficient documentation

## 2013-09-01 DIAGNOSIS — Z4689 Encounter for fitting and adjustment of other specified devices: Secondary | ICD-10-CM | POA: Insufficient documentation

## 2013-09-01 LAB — CBC
HCT: 39.7 % (ref 39.0–52.0)
Hemoglobin: 13.1 g/dL (ref 13.0–17.0)
MCH: 27.5 pg (ref 26.0–34.0)
MCHC: 33 g/dL (ref 30.0–36.0)
MCV: 83.4 fL (ref 78.0–100.0)
Platelets: 171 10*3/uL (ref 150–400)
RBC: 4.76 MIL/uL (ref 4.22–5.81)
RDW: 16.4 % — ABNORMAL HIGH (ref 11.5–15.5)
WBC: 4.3 10*3/uL (ref 4.0–10.5)

## 2013-09-01 LAB — PROTIME-INR
INR: 1.04 (ref 0.00–1.49)
PROTHROMBIN TIME: 13.4 s (ref 11.6–15.2)

## 2013-09-01 LAB — BASIC METABOLIC PANEL
BUN: 21 mg/dL (ref 6–23)
CO2: 23 meq/L (ref 19–32)
Calcium: 9.2 mg/dL (ref 8.4–10.5)
Chloride: 105 mEq/L (ref 96–112)
Creatinine, Ser: 1.28 mg/dL (ref 0.50–1.35)
GFR calc Af Amer: 65 mL/min — ABNORMAL LOW (ref >90)
GFR calc non Af Amer: 56 mL/min — ABNORMAL LOW (ref >90)
Glucose, Bld: 101 mg/dL — ABNORMAL HIGH (ref 70–99)
Potassium: 4.4 mEq/L (ref 3.7–5.3)
Sodium: 142 mEq/L (ref 137–147)

## 2013-09-01 LAB — APTT: aPTT: 26 seconds (ref 24–37)

## 2013-09-01 MED ORDER — MIDAZOLAM HCL 2 MG/2ML IJ SOLN
INTRAMUSCULAR | Status: AC | PRN
  Administered 2013-09-01 (×2): 1 mg via INTRAVENOUS

## 2013-09-01 MED ORDER — FENTANYL CITRATE 0.05 MG/ML IJ SOLN
INTRAMUSCULAR | Status: AC | PRN
  Administered 2013-09-01 (×2): 25 ug via INTRAVENOUS

## 2013-09-01 MED ORDER — MIDAZOLAM HCL 2 MG/2ML IJ SOLN
INTRAMUSCULAR | Status: AC
  Filled 2013-09-01: qty 4

## 2013-09-01 MED ORDER — FENTANYL CITRATE 0.05 MG/ML IJ SOLN
INTRAMUSCULAR | Status: AC
  Filled 2013-09-01: qty 2

## 2013-09-01 MED ORDER — SODIUM CHLORIDE 0.9 % IV SOLN
INTRAVENOUS | Status: DC
  Administered 2013-09-01: 09:00:00 via INTRAVENOUS

## 2013-09-01 NOTE — Procedures (Signed)
Interventional Radiology Procedure Note  Procedure: Retrieval of IVC filter.  Complications: None Recommendations: - Bedrest x 2 hrs with HOB elevated - ADAT - DC home  Signed,  Criselda Peaches, MD [facility]

## 2013-09-01 NOTE — Discharge Instructions (Signed)
Wound Care ° °HOME CARE  °· Only take medicine as told by your doctor. °· Clean the wound daily with mild soap and water. °· Change any bandages (dressings) as told by your doctor. °· Put medicated cream and a bandage on the wound as told by your doctor. °· Change the bandage if it gets wet, dirty, or starts to smell. °· Take showers. Do not take baths, swim, or do anything that puts your wound under water. °· Rest and raise (elevate) the wound until the pain and puffiness (swelling) are better. °· Keep all doctor visits as told. °GET HELP RIGHT AWAY IF:  °· Yellowish-white fluid (pus) comes from the wound. °· Medicine does not lessen your pain. °· There is a red streak going away from the wound. °· You have a fever. °MAKE SURE YOU:  °· Understand these instructions. °· Will watch your condition. °· Will get help right away if you are not doing well or get worse. °Document Released: 05/09/2008 Document Revised: 10/23/2011 Document Reviewed: 12/04/2010 °[employer]® Patient Information ©2014 [employer]. ° °

## 2013-09-01 NOTE — H&P (Signed)
Chief Complaint: "Im here for my IVC filter removal"  HPI: Jack [name] is an 68 y.o. male with extensive bilateral LE DVT and PE. He underwent prolonged complicated hospitalization including thrombolysis of LE DVT, with subsequent bilateral fasciotomies for compartment syndrome. An IVC filter was placed as well. He has now completed his therapy and has been taken off Xarelto. He was seen about a month ago and is now scheduled for IVC filter retrieval. PMHx, meds, allergies reviewed.  Past Medical History:  Past Medical History  Diagnosis Date  . HTN (hypertension)   . Ureteral stone   . Colon cancer   . Shortness of breath   . Asthma   . Chronic kidney disease     acuts on chronic renal failure  . Arthritis   . PE (pulmonary embolism) 04/2013  . DVT, bilateral lower limbs   . Compartment syndrome, traumatic, lower extremity     Past Surgical History:  Past Surgical History  Procedure Laterality Date  . Ureteral stent placement  04/10/2013  . Transurethral resection of prostate  04/10/2013  . Right colectomy  1996  . Tonsillectomy    . Cystoscopy with stent placement Bilateral 04/19/2013    Procedure: CYSTOSCOPY WITH STENT PLACEMENT;  Surgeon: Malka So, MD;  Location: [place];  Service: Urology;  Laterality: Bilateral;  . Cystoscopy N/A 04/19/2013    Procedure: CYSTOSCOPY WITH CLOT EVACUATION ;  Surgeon: Malka So, MD;  Location: [place];  Service: Urology;  Laterality: N/A;  . Laparotomy N/A 04/19/2013    Procedure: EXPLORATORY LAPAROTOMY;  Surgeon: Malka So, MD;  Location: [place];  Service: Urology;  Laterality: N/A;  Exploratory Laparotomy with evacuation of blood clot in bladder with placement of super pubic tube   . Fasciotomy Right 05/14/2013    Procedure: FASCIOTOMY with wound vac placement;  Surgeon: Conrad [place], MD;  Location: [place];  Service: Vascular;  Laterality: Right;  . Application of wound vac Right 05/14/2013    Procedure: APPLICATION OF WOUND VAC;  Surgeon: Conrad [place], MD;  Location: [place];  Service: Vascular;  Laterality: Right;  . Fasciotomy Left 05/17/2013    Procedure: FASCIOTOMY LEG;  Surgeon: Elam Dutch, MD;  Location: [place];  Service: Vascular;  Laterality: Left;  . Fasciotomy closure Right 05/19/2013    Procedure: RIGHT LOWER EXTREMITY CLOSURE OF LATERAL AND MEDIAL FASCIOTOMIES ;  Surgeon: Conrad [place], MD;  Location: [place];  Service: Vascular;  Laterality: Right;  . Fasciotomy closure Left 05/21/2013    Procedure: FASCIOTOMY CLOSURE- LEFT LEG;  Surgeon: Conrad , MD;  Location: [facility] OR;  Service: Vascular;  Laterality: Left;    Family History:  Family History  Problem Relation Age of Onset  . Urolithiasis    . Deep vein thrombosis Mother   . Heart disease Sister     Social History:  reports that he has never smoked. His smokeless tobacco use includes Chew. He reports that he drinks alcohol. He reports that he does not use illicit drugs.  Allergies:  Allergies  Allergen Reactions  . Contrast Media [Iodinated Diagnostic Agents]     On 04/29/2013 spoke with patient he had some type of procedure at [facility] and broke out in hives after the exams in 1997, he did not know what of type of study he had.  I called [facility] Radiology department the only type of contrast they used during that time  was [place]    Medications:  Medication List    ASK your doctor about these medications       acetaminophen 325 MG tablet  Commonly known as:  TYLENOL  Take 650 mg by mouth every 6 (six) hours as needed for pain.     albuterol 108 (90 BASE) MCG/ACT inhaler  Commonly known as:  PROVENTIL HFA;VENTOLIN HFA  Inhale 2 puffs into the lungs every 6 (six) hours as needed for wheezing.     cephALEXin 500 MG capsule  Commonly known as:  KEFLEX  Take 2 capsules (1,000 mg total) by mouth 2 (two) times daily.     collagenase ointment  Commonly known as:  SANTYL  Apply 1 application topically daily.     cyanocobalamin 1000 MCG  tablet  Take 1 tablet (1,000 mcg total) by mouth daily.     DSS 100 MG Caps  Take 100 mg by mouth 2 (two) times daily.     ferrous sulfate 325 (65 FE) MG tablet  Take 325 mg by mouth daily with breakfast.     oxyCODONE 5 MG immediate release tablet  Commonly known as:  Oxy IR/ROXICODONE  Take 5 mg by mouth every 6 (six) hours as needed for moderate pain.     pantoprazole 40 MG tablet  Commonly known as:  PROTONIX  Take 1 tablet (40 mg total) by mouth daily at 12 noon.     tamsulosin 0.4 MG Caps capsule  Commonly known as:  FLOMAX  Take 0.4 mg by mouth daily.     VITAMIN D-3 PO  Take 1 capsule by mouth daily.        Please HPI for pertinent positives, otherwise complete 10 system ROS negative.  Physical Exam: BP 149/62  Pulse 66  Temp(Src) 97.4 F (36.3 C) (Oral)  Resp 20  Ht 6' (1.829 m)  Wt 287 lb (130.182 kg)  BMI 38.92 kg/m2  SpO2 100% Body mass index is 38.92 kg/(m^2).   General Appearance:  Alert, cooperative, no distress, appears stated age  Head:  Normocephalic, without obvious abnormality, atraumatic  ENT: Unremarkable  Neck: Supple, symmetrical, trachea midline  Lungs:   Clear to auscultation bilaterally, no w/r/r.  Chest Wall:  No tenderness or deformity  Heart:  Regular rate and rhythm, S1, S2 normal, no murmur, rub or gallop.  Abdomen:   Soft, non-tender, non distended.  Extremities: (B)LE edema. Healed fasciotomy scars. NT  Pulses: 2+ and symmetric  Neurologic: Normal affect, no gross deficits.   Results for orders placed during the hospital encounter of 09/01/13 (from the past 48 hour(s))  BASIC METABOLIC PANEL     Status: Abnormal   Collection Time    09/01/13  8:45 AM      Result Value Range   Sodium 142  137 - 147 mEq/L   Potassium 4.4  3.7 - 5.3 mEq/L   Chloride 105  96 - 112 mEq/L   CO2 23  19 - 32 mEq/L   Glucose, Bld 101 (*) 70 - 99 mg/dL   BUN 21  6 - 23 mg/dL   Creatinine, Ser 1.28  0.50 - 1.35 mg/dL   Calcium 9.2  8.4 - 10.5  mg/dL   GFR calc non Af Amer 56 (*) >90 mL/min   GFR calc Af Amer 65 (*) >90 mL/min   Comment: (NOTE)     The eGFR has been calculated using the CKD EPI equation.     This calculation has not been validated in all clinical situations.     eGFR's  persistently <90 mL/min signify possible Chronic Kidney     Disease.  CBC     Status: Abnormal   Collection Time    09/01/13  8:45 AM      Result Value Range   WBC 4.3  4.0 - 10.5 K/uL   RBC 4.76  4.22 - 5.81 MIL/uL   Hemoglobin 13.1  13.0 - 17.0 g/dL   HCT 39.7  39.0 - 52.0 %   MCV 83.4  78.0 - 100.0 fL   MCH 27.5  26.0 - 34.0 pg   MCHC 33.0  30.0 - 36.0 g/dL   RDW 16.4 (*) 11.5 - 15.5 %   Platelets 171  150 - 400 K/uL   No results found.  Assessment/Plan Hx DVT/PE Discussed IVC filter retrieval. Explained procedure, risks, complications, use of sedation. Labs reviewed, ok Consent signed in chart  Ascencion Dike PA-C 09/01/2013, 9:48 AM

## 2013-09-01 NOTE — Progress Notes (Signed)
Discharge instruction given per MD order.  Pt and Cg able to verbalize understanding  Pt to car via wheelchair. 

## 2013-10-02 ENCOUNTER — Ambulatory Visit: Payer: PRIVATE HEALTH INSURANCE | Admitting: Infectious Disease

## 2013-10-15 ENCOUNTER — Encounter: Payer: Self-pay | Admitting: Infectious Disease

## 2013-10-15 ENCOUNTER — Ambulatory Visit (INDEPENDENT_AMBULATORY_CARE_PROVIDER_SITE_OTHER): Payer: PRIVATE HEALTH INSURANCE | Admitting: Infectious Disease

## 2013-10-15 VITALS — BP 175/76 | HR 77 | Temp 98.2°F | Ht 72.0 in | Wt 297.0 lb

## 2013-10-15 DIAGNOSIS — R7881 Bacteremia: Secondary | ICD-10-CM

## 2013-10-15 DIAGNOSIS — B9561 Methicillin susceptible Staphylococcus aureus infection as the cause of diseases classified elsewhere: Secondary | ICD-10-CM

## 2013-10-15 DIAGNOSIS — A4901 Methicillin susceptible Staphylococcus aureus infection, unspecified site: Secondary | ICD-10-CM

## 2013-10-15 DIAGNOSIS — Z9889 Other specified postprocedural states: Secondary | ICD-10-CM

## 2013-10-15 NOTE — Progress Notes (Signed)
   Subjective:    Patient ID: Jack [name], male    DOB: 09-Apr-1946, 68 y.o.   MRN: [id number]  HPI   Jack [name] is a 68 y.o. male with MSSA bacteremia after bilateral fasciotomies,  TURP.   TTE supoptimal images. Suprapubic out , foley in  Plan was to  give him 6 weeks of IV ancef with day #1 being 06/15/13 , unfortunately he was COMPLETELY under-dosed on the IV abx and we had to restart on high dose ancef at 2g iv q 8 hours x 6 weeks. We followed that with keflex 1 g po bid, and then he actually refilled his meds and went to 500mg  bid per his PCP.  His fasciotomies seem even more improved on exam today.   He is without fevers, chills, nausea.    Review of Systems  Constitutional: Negative for fever, chills, diaphoresis, activity change, appetite change, fatigue and unexpected weight change.  HENT: Negative for congestion, rhinorrhea, sinus pressure, sneezing, sore throat and trouble swallowing.   Eyes: Negative for photophobia and visual disturbance.  Respiratory: Negative for cough, chest tightness, shortness of breath, wheezing and stridor.   Cardiovascular: Negative for chest pain, palpitations and leg swelling.  Gastrointestinal: Negative for nausea, vomiting, abdominal pain, diarrhea, constipation, blood in stool, abdominal distention and anal bleeding.  Genitourinary: Negative for flank pain.  Musculoskeletal: Negative for arthralgias, back pain, gait problem, joint swelling and myalgias.  Skin: Positive for color change and wound. Negative for pallor and rash.  Neurological: Negative for dizziness, tremors, weakness and light-headedness.  Hematological: Negative for adenopathy. Does not bruise/bleed easily.  Psychiatric/Behavioral: Negative for behavioral problems, confusion, sleep disturbance, dysphoric mood, decreased concentration and agitation.       Objective:   Physical Exam  Constitutional: He is oriented to person, place, and time. He appears well-developed and  well-nourished. No distress.  HENT:  Head: Normocephalic and atraumatic.  Mouth/Throat: Oropharynx is clear and moist. No oropharyngeal exudate.  Eyes: Conjunctivae and EOM are normal. No scleral icterus.  Neck: Normal range of motion. Neck supple. No JVD present.  Cardiovascular: Normal rate and regular rhythm.   Pulmonary/Chest: Effort normal. No respiratory distress. He has no wheezes.  Abdominal: Soft. He exhibits no distension.  Lymphadenopathy:    He has no cervical adenopathy.  Neurological: He is alert and oriented to person, place, and time. He exhibits normal muscle tone. Coordination normal.  Skin: Skin is warm and dry. He is not diaphoretic.  Psychiatric: He has a normal mood and affect. His behavior is normal. Judgment and thought content normal.    Fasciotomy sites well healed and without any purulence or discharge:                  Assessment & Plan:   #1 MSSA bacteremia and MRSA in urine, sp faciotomies and sp removal of suprapubic catheter in pt sp TURP --he is now sp repeat IV therapy followed by po keflex  --he can stop his po keflex for now and watch sites closely   #2Fasciotomy sites: appear to be healing well, can come off his antibiotics

## 2015-01-15 IMAGING — US IR IVC FILTER PLMT / S&I /IMG GUID/MOD SED
1 series · 1 of 1 positions shown · non-contrast
Comparison: none

INDICATION: High probability for pulmonary embolism based on
ventilation and perfusion nuclear medicine examination.  Hematuria
and left renal hematoma.  The patient is not a candidate for
anticoagulation at this time.

[Series 1: ir ivc filter plmt / s&i /img guid/mod sed · 1 of 1 slices shown]
[im 1/1]
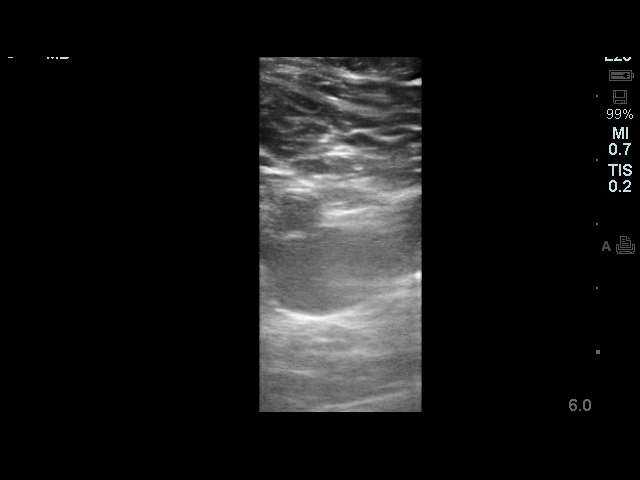

[1 of 1 positions shown; findings below may reference images not displayed]

PROCEDURE(S): IVC FILTER PLACEMENT; IVC VENOGRAM; ULTRASOUND FOR
VASCULAR ACCESS

Medications: Versed 4 mg, Fentanyl 100 mcg. A radiology nurse
monitored the patient for moderate sedation.

Moderate sedation time: 33 minutes

Fluoroscopy time:  2 minutes and 36 seconds

Contrast:Carbon dioxide

Procedure:Informed consent was obtained for an IVC venogram and
filter placement.  Ultrasound demonstrated a patent right common
femoral vein.  Ultrasound images were obtained for documentation.
The right groin was prepped and draped in a sterile fashion.
Maximal barrier sterile technique was utilized including caps,
mask, sterile gowns, sterile gloves, sterile drape, hand hygiene
and skin antiseptic.  The skin was anesthetized with 1% lidocaine.
A 21 gauge needle was directed into the vein with ultrasound
guidance and a micropuncture dilator set was placed.  A wire was
advanced into the IVC.  The filter sheath was advanced over the
wire into the IVC.  An IVC venogram was performed with carbon
dioxide.  Fluoroscopic images were obtained for documentation. The
location of the renal veins was confirmed by cannulating the renal
veins with a Bentson wire.   [employer] filter was deployed below the
lowest renal vein.  The vascular sheath was removed with manual
compression.
FINDINGS: IVC was patent.  Bilateral renal veins were identified.
The filter was deployed below the lowest renal vein.
IMPRESSION: Successful placement of a retrievable IVC filter.

## 2015-01-16 IMAGING — CR DG CHEST 1V PORT
2 series · 2 of 2 positions shown · non-contrast
Comparison: Earlier today at [DATE] p.m..

CLINICAL DATA: Endotracheal tube placement and central line
placement.

PORTABLE CHEST - 1 VIEW

[AP (1 of 2)]
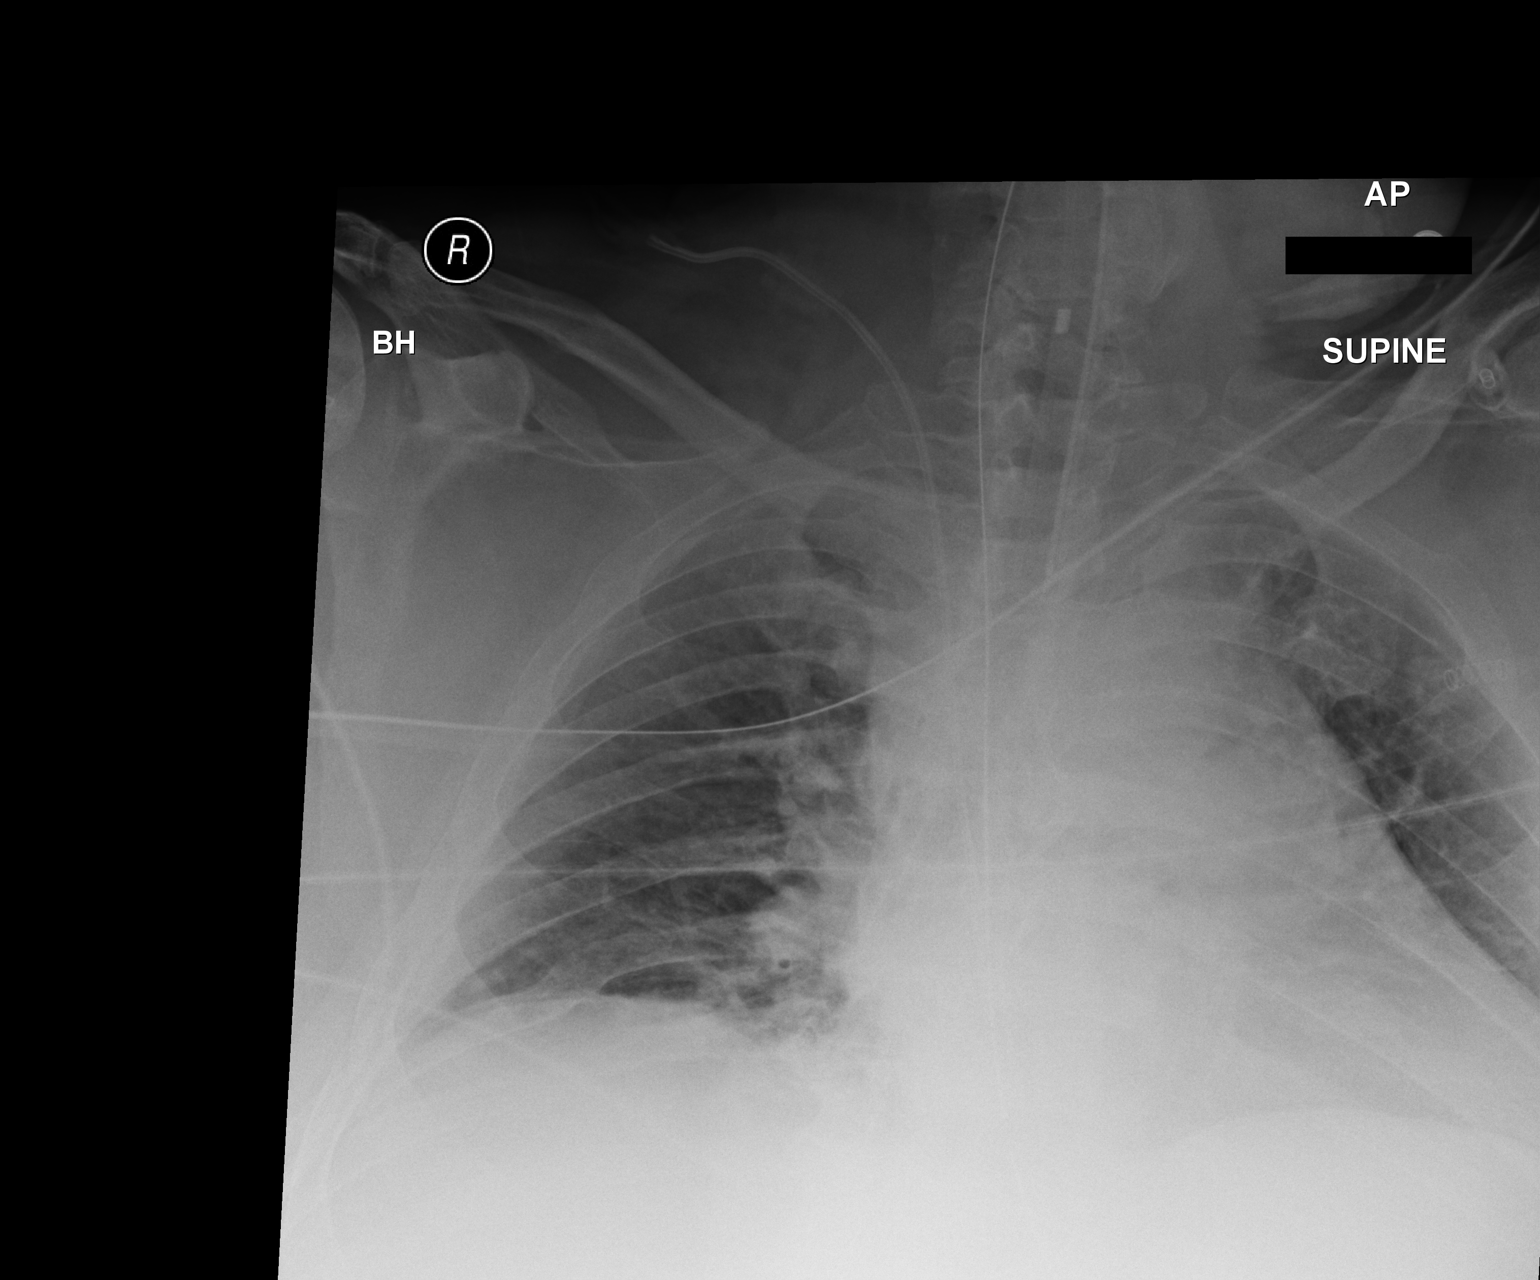

[AP (2 of 2)]
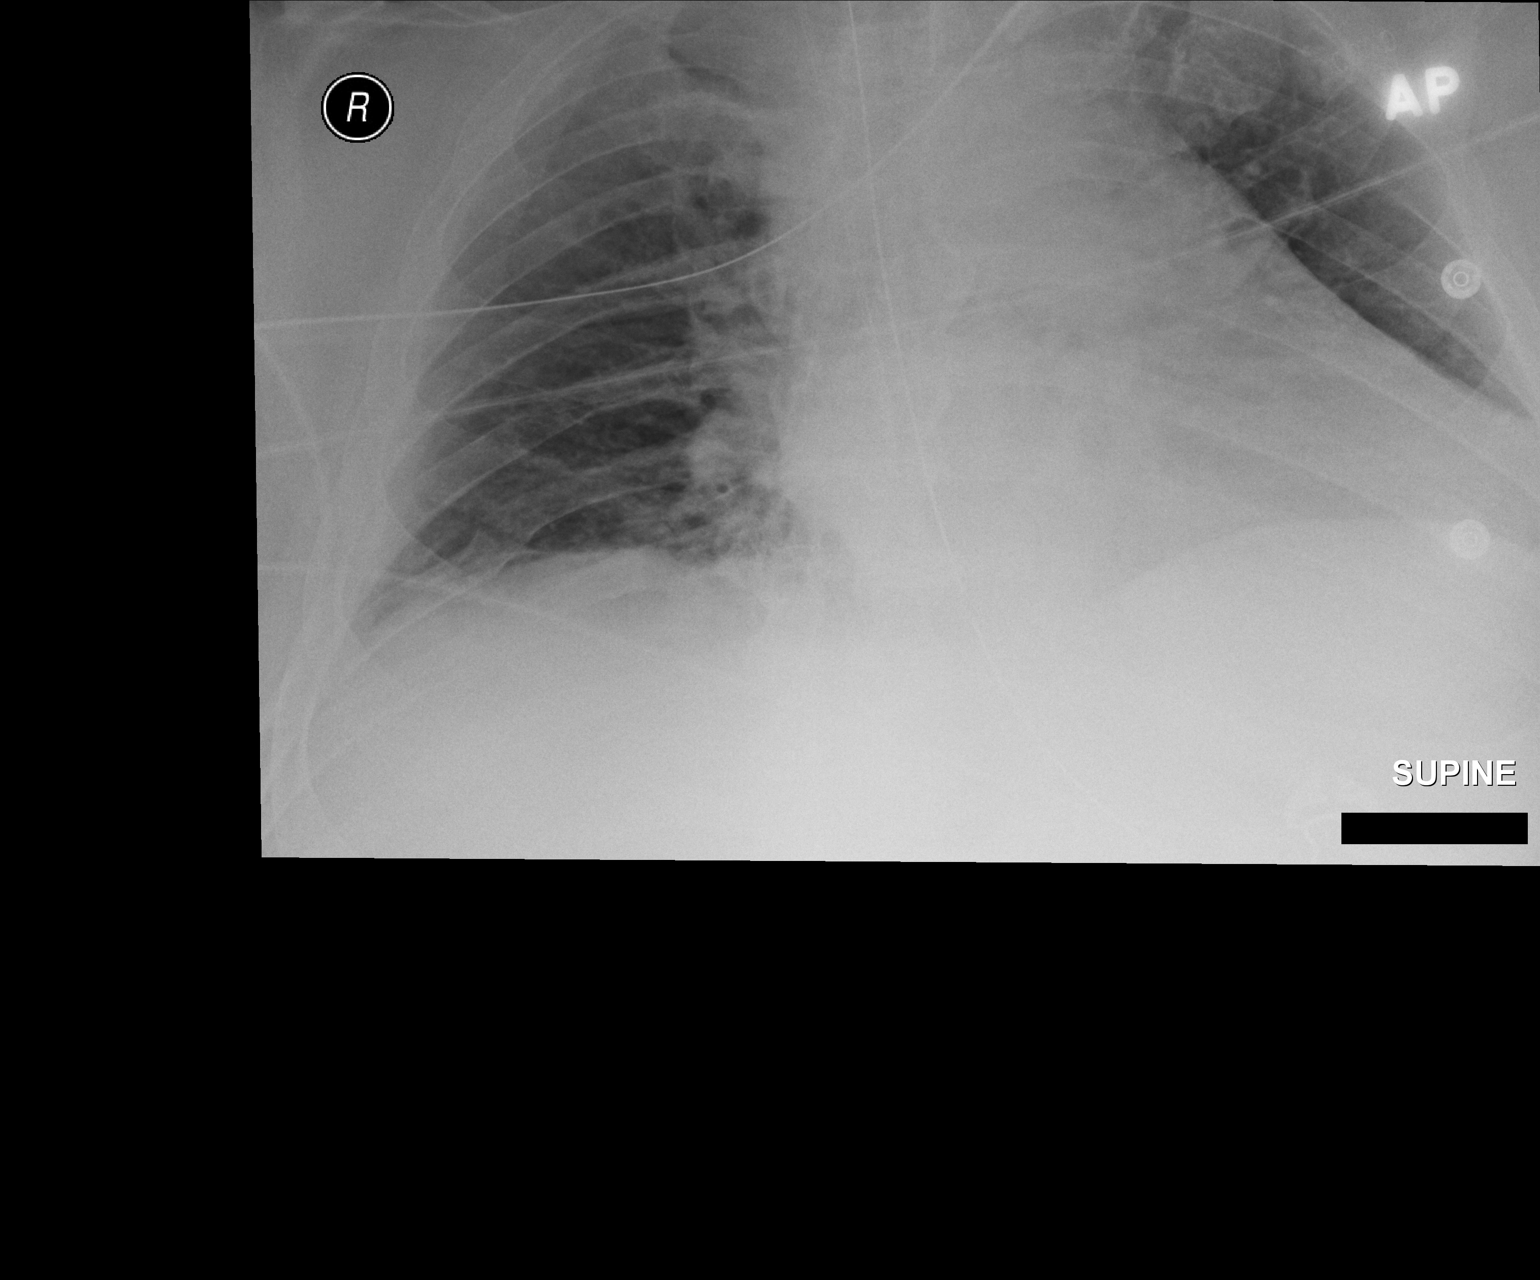

[2 of 2 positions shown; findings below may reference images not displayed]

FINDINGS: Mildly degraded exam due to AP portable technique and
patient body habitus.

Endotracheal tube 3.0 cm above carina.  Nasogastric tube extends
beyond the  inferior aspect of the film.  Right IJ central line
difficult to visualize centrally.  Followed to at least the level
of the mid right atrium.

No pneumothorax.  Remote right rib trauma. Cardiomegaly accentuated
by AP portable technique.  Left costophrenic angle  excluded.  No
definite pleural fluid. No congestive failure.

Mild right base volume loss.
IMPRESSION: Endotracheal tube appropriately positioned.

Right internal jugular line which is difficult to visualize
centrally.  Followed to at least the level of the mid right atrium.
Recommend retraction approximately 5.5 cm with repeat radiograph.

Cardiomegaly and low lung volumes, without congestive failure

## 2015-01-16 IMAGING — CR DG CHEST 1V PORT
1 series · 1 of 1 positions shown · non-contrast
Comparison: Chest x-ray 04/18/2013.

CLINICAL DATA: Evaluate for pulmonary edema.

PORTABLE CHEST - 1 VIEW

[AP]
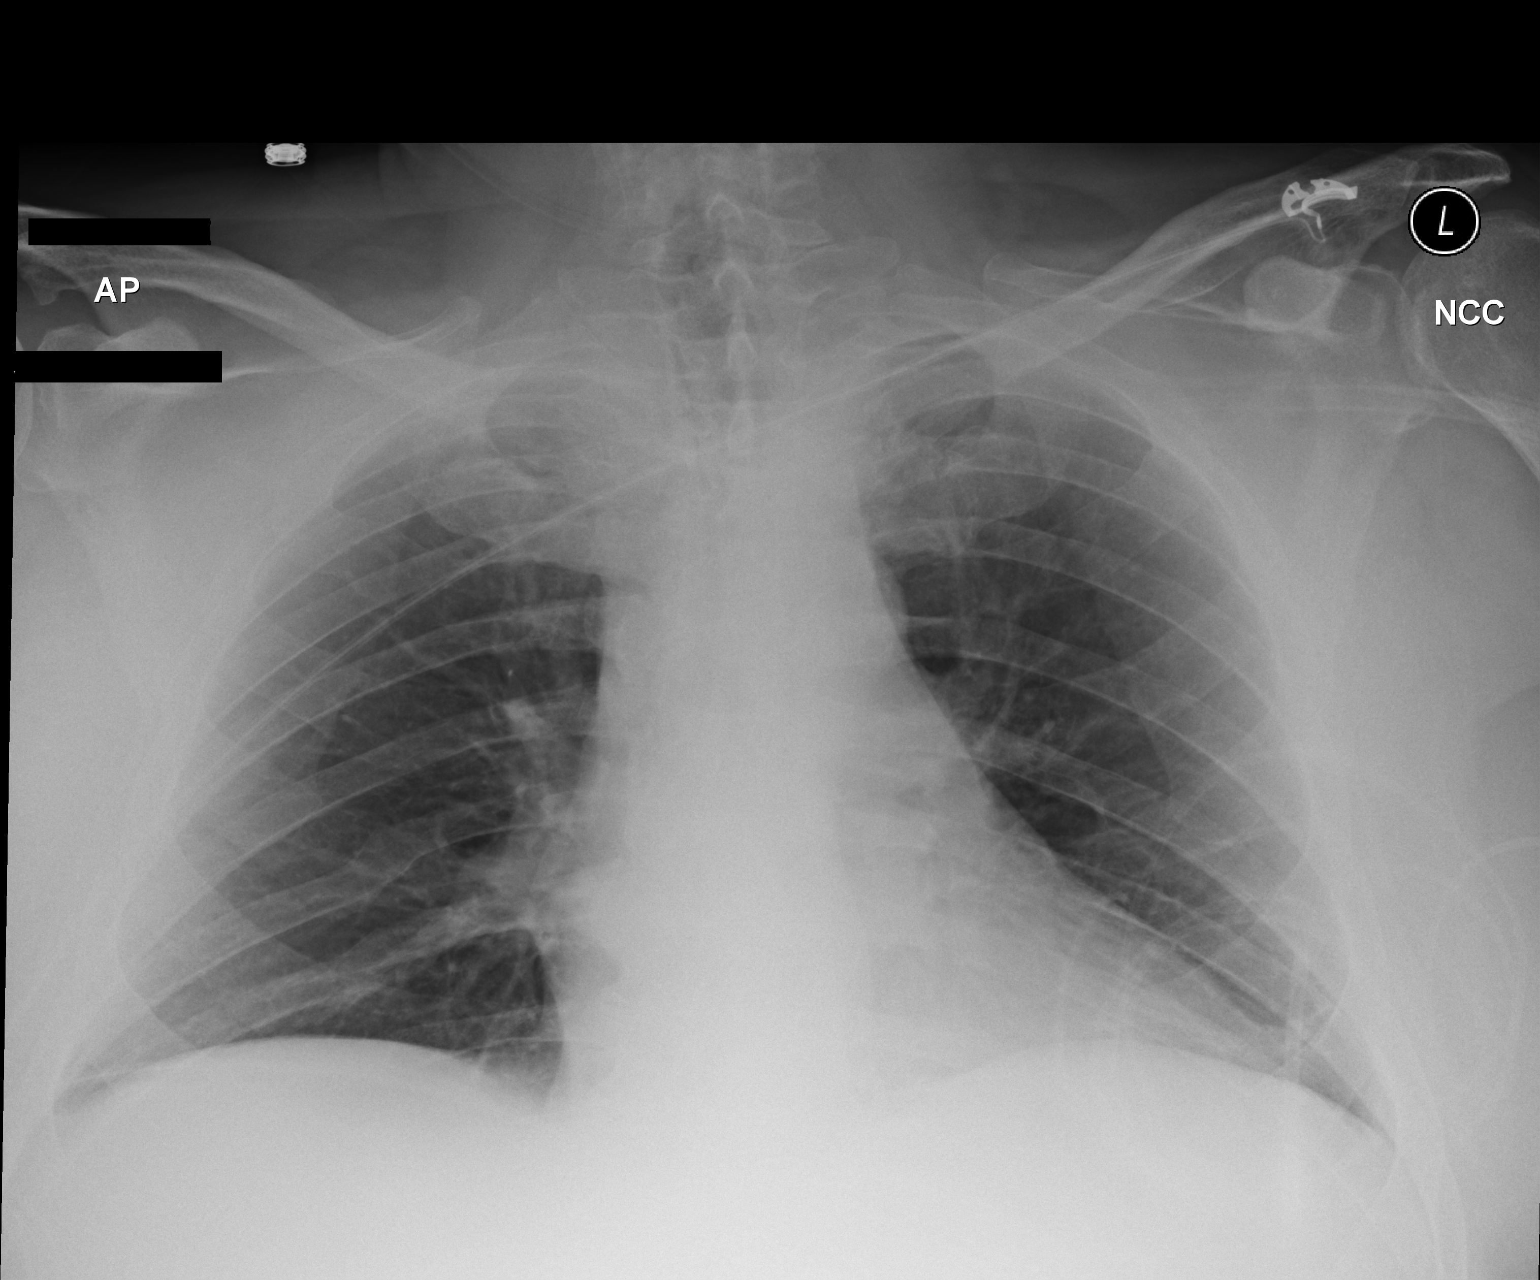

[1 of 1 positions shown; findings below may reference images not displayed]

FINDINGS: Lung volumes are normal.  No consolidative airspace
disease.  No pleural effusions.  No pneumothorax.  No pulmonary
nodule or mass noted.  Pulmonary vasculature and the
cardiomediastinal silhouette are within normal limits.
IMPRESSION: 1. No radiographic evidence of acute cardiopulmonary disease.
Specifically, no evidence of pulmonary edema at this time.

## 2015-01-17 IMAGING — CR DG CHEST 1V PORT
1 series · 1 of 1 positions shown · non-contrast
Comparison: 04/19/2013; 04/18/2013

CLINICAL DATA: Evaluate pulmonary edema

PORTABLE CHEST - 1 VIEW

[AP]
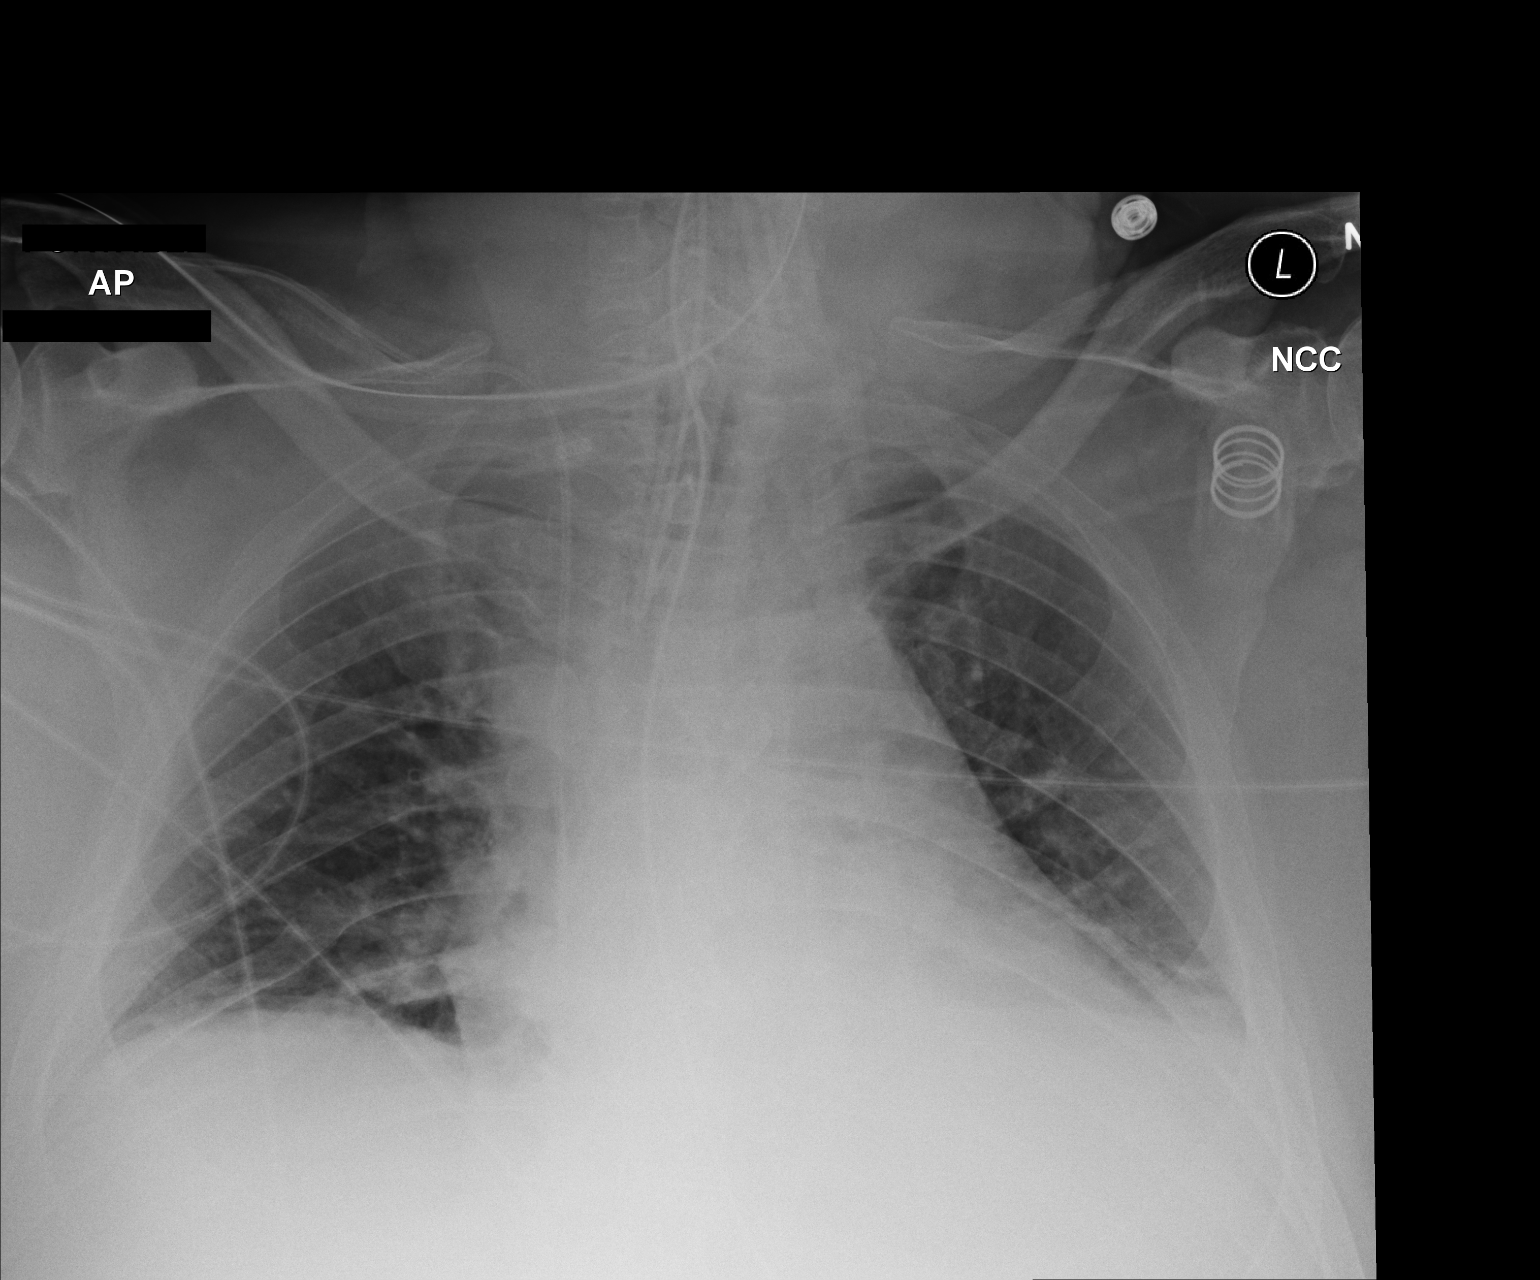

[1 of 1 positions shown; findings below may reference images not displayed]

FINDINGS: Grossly unchanged enlarged cardiac silhouette and mediastinal
contours given persistently reduced lung volumes.  Stable
positioning of support apparatus.  No pneumothorax. Mild
cephalization of flow without frank evidence of edema.  Trace
bilateral effusions are not excluded. No change to slight worsening
in bibasilar heterogeneous opacities, left greater than right.
There is unchanged deformity involving the posterior lateral aspect
of the right eighth and ninth ribs.  No acute osseous
abnormalities.
IMPRESSION: 1.  Stable positioning of support apparatus.  No pneumothorax.
2.  Persistent findings of cardiomegaly, hypoventilation and
pulmonary venous congestion without frank evidence of edema.

3.  No change to slight worsening of bibasilar opacities, left
greater than right, likely atelectasis.
